# Patient Record
Sex: Male | Born: 1970
Health system: Southern US, Community
[De-identification: ages and names within clinical notes are randomized; demographics above are authoritative.]

## PROBLEM LIST (undated history)

## (undated) DIAGNOSIS — I10 Essential (primary) hypertension: Secondary | ICD-10-CM

## (undated) DIAGNOSIS — E7800 Pure hypercholesterolemia, unspecified: Secondary | ICD-10-CM

## (undated) DIAGNOSIS — Z87442 Personal history of urinary calculi: Secondary | ICD-10-CM

## (undated) DIAGNOSIS — K429 Umbilical hernia without obstruction or gangrene: Secondary | ICD-10-CM

## (undated) DIAGNOSIS — K469 Unspecified abdominal hernia without obstruction or gangrene: Secondary | ICD-10-CM

## (undated) DIAGNOSIS — G4730 Sleep apnea, unspecified: Secondary | ICD-10-CM

## (undated) DIAGNOSIS — E119 Type 2 diabetes mellitus without complications: Secondary | ICD-10-CM

## (undated) DIAGNOSIS — T7840XA Allergy, unspecified, initial encounter: Secondary | ICD-10-CM

## (undated) HISTORY — PX: KIDNEY STONE SURGERY: SHX686

## (undated) HISTORY — PX: INGUINAL HERNIA REPAIR: SUR1180

## (undated) HISTORY — DX: Allergy, unspecified, initial encounter: T78.40XA

## (undated) HISTORY — PX: FOOT SURGERY: SHX648

## (undated) HISTORY — DX: Umbilical hernia without obstruction or gangrene: K42.9

---

## 1999-03-29 ENCOUNTER — Emergency Department (HOSPITAL_COMMUNITY): Admission: EM | Admit: 1999-03-29 | Discharge: 1999-03-29 | Payer: Self-pay

## 1999-12-08 ENCOUNTER — Emergency Department (HOSPITAL_COMMUNITY): Admission: EM | Admit: 1999-12-08 | Discharge: 1999-12-08 | Payer: Self-pay | Admitting: Emergency Medicine

## 1999-12-09 ENCOUNTER — Emergency Department (HOSPITAL_COMMUNITY): Admission: EM | Admit: 1999-12-09 | Discharge: 1999-12-09 | Payer: Self-pay | Admitting: *Deleted

## 1999-12-09 ENCOUNTER — Encounter: Payer: Self-pay | Admitting: Emergency Medicine

## 1999-12-10 ENCOUNTER — Encounter: Payer: Self-pay | Admitting: Emergency Medicine

## 1999-12-14 ENCOUNTER — Ambulatory Visit (HOSPITAL_COMMUNITY): Admission: RE | Admit: 1999-12-14 | Discharge: 1999-12-14 | Payer: Self-pay | Admitting: Urology

## 1999-12-14 ENCOUNTER — Encounter: Payer: Self-pay | Admitting: Urology

## 1999-12-19 ENCOUNTER — Emergency Department (HOSPITAL_COMMUNITY): Admission: EM | Admit: 1999-12-19 | Discharge: 1999-12-19 | Payer: Self-pay | Admitting: Emergency Medicine

## 2001-12-13 ENCOUNTER — Emergency Department (HOSPITAL_COMMUNITY): Admission: EM | Admit: 2001-12-13 | Discharge: 2001-12-13 | Payer: Self-pay | Admitting: Emergency Medicine

## 2002-11-22 ENCOUNTER — Encounter: Admission: RE | Admit: 2002-11-22 | Discharge: 2002-11-22 | Payer: Self-pay | Admitting: Internal Medicine

## 2002-12-06 ENCOUNTER — Encounter: Admission: RE | Admit: 2002-12-06 | Discharge: 2002-12-06 | Payer: Self-pay | Admitting: Internal Medicine

## 2002-12-06 ENCOUNTER — Ambulatory Visit (HOSPITAL_COMMUNITY): Admission: RE | Admit: 2002-12-06 | Discharge: 2002-12-06 | Payer: Self-pay | Admitting: Internal Medicine

## 2005-04-26 ENCOUNTER — Ambulatory Visit: Payer: Self-pay | Admitting: Family Medicine

## 2005-10-28 ENCOUNTER — Emergency Department (HOSPITAL_COMMUNITY): Admission: EM | Admit: 2005-10-28 | Discharge: 2005-10-28 | Payer: Self-pay | Admitting: Family Medicine

## 2005-11-01 ENCOUNTER — Emergency Department (HOSPITAL_COMMUNITY): Admission: EM | Admit: 2005-11-01 | Discharge: 2005-11-01 | Payer: Self-pay | Admitting: Family Medicine

## 2005-12-16 HISTORY — PX: CARPAL TUNNEL RELEASE: SHX101

## 2007-07-18 ENCOUNTER — Emergency Department (HOSPITAL_COMMUNITY): Admission: EM | Admit: 2007-07-18 | Discharge: 2007-07-18 | Payer: Self-pay | Admitting: Family Medicine

## 2007-10-06 ENCOUNTER — Emergency Department (HOSPITAL_COMMUNITY): Admission: EM | Admit: 2007-10-06 | Discharge: 2007-10-06 | Payer: Self-pay | Admitting: Emergency Medicine

## 2008-01-16 ENCOUNTER — Emergency Department (HOSPITAL_COMMUNITY): Admission: EM | Admit: 2008-01-16 | Discharge: 2008-01-16 | Payer: Self-pay | Admitting: Emergency Medicine

## 2010-08-15 ENCOUNTER — Emergency Department (HOSPITAL_COMMUNITY): Admission: EM | Admit: 2010-08-15 | Discharge: 2010-08-15 | Payer: Self-pay | Admitting: Family Medicine

## 2011-03-07 ENCOUNTER — Inpatient Hospital Stay (INDEPENDENT_AMBULATORY_CARE_PROVIDER_SITE_OTHER)
Admission: RE | Admit: 2011-03-07 | Discharge: 2011-03-07 | Disposition: A | Discharge status: Home / Self Care | Payer: BC Managed Care – PPO | Source: Ambulatory Visit | Attending: Family Medicine | Admitting: Family Medicine

## 2011-03-07 ENCOUNTER — Emergency Department (HOSPITAL_COMMUNITY): Payer: BC Managed Care – PPO

## 2011-03-07 ENCOUNTER — Encounter (HOSPITAL_COMMUNITY): Payer: Self-pay | Admitting: Radiology

## 2011-03-07 ENCOUNTER — Emergency Department (HOSPITAL_COMMUNITY)
Admission: EM | Admit: 2011-03-07 | Discharge: 2011-03-07 | Disposition: A | Discharge status: Home / Self Care | Payer: BC Managed Care – PPO | Attending: Emergency Medicine | Admitting: Emergency Medicine

## 2011-03-07 DIAGNOSIS — R1032 Left lower quadrant pain: Secondary | ICD-10-CM | POA: Insufficient documentation

## 2011-03-07 DIAGNOSIS — R109 Unspecified abdominal pain: Secondary | ICD-10-CM

## 2011-03-07 HISTORY — DX: Unspecified abdominal hernia without obstruction or gangrene: K46.9

## 2011-03-07 LAB — DIFFERENTIAL
Lymphocytes Relative: 36 % (ref 12–46)
Lymphs Abs: 3.7 10*3/uL (ref 0.7–4.0)
Monocytes Absolute: 0.7 10*3/uL (ref 0.1–1.0)
Monocytes Relative: 7 % (ref 3–12)
Neutro Abs: 5.5 10*3/uL (ref 1.7–7.7)

## 2011-03-07 LAB — URINALYSIS, ROUTINE W REFLEX MICROSCOPIC
Bilirubin Urine: NEGATIVE
Nitrite: NEGATIVE
Specific Gravity, Urine: 1.023 (ref 1.005–1.030)
Urobilinogen, UA: 0.2 mg/dL (ref 0.0–1.0)

## 2011-03-07 LAB — COMPREHENSIVE METABOLIC PANEL
ALT: 33 U/L (ref 0–53)
CO2: 29 mEq/L (ref 19–32)
Calcium: 9.6 mg/dL (ref 8.4–10.5)
Creatinine, Ser: 1.14 mg/dL (ref 0.4–1.5)
GFR calc non Af Amer: >60 mL/min (ref >60)
Glucose, Bld: 87 mg/dL (ref 70–99)
Sodium: 138 mEq/L (ref 135–145)

## 2011-03-07 LAB — LIPASE, BLOOD: Lipase: 33 U/L (ref 11–59)

## 2011-03-07 LAB — CBC
HCT: 45.5 % (ref 39.0–52.0)
Hemoglobin: 14.6 g/dL (ref 13.0–17.0)
MCH: 23.9 pg — ABNORMAL LOW (ref 26.0–34.0)
MCHC: 32.1 g/dL (ref 30.0–36.0)

## 2011-03-07 MED ORDER — IOHEXOL 300 MG/ML  SOLN
100.0000 mL | Freq: Once | INTRAMUSCULAR | Status: AC | PRN
  Administered 2011-03-07: 100 mL via INTRAVENOUS

## 2011-09-06 LAB — INFLUENZA A AND B ANTIGEN (CONVERTED LAB): Inflenza A Ag: NEGATIVE

## 2012-10-16 ENCOUNTER — Emergency Department (HOSPITAL_COMMUNITY)
Admission: EM | Admit: 2012-10-16 | Discharge: 2012-10-16 | Disposition: A | Discharge status: Home / Self Care | Payer: BC Managed Care – PPO | Source: Home / Self Care

## 2012-10-16 ENCOUNTER — Encounter (HOSPITAL_COMMUNITY): Payer: Self-pay | Admitting: *Deleted

## 2012-10-16 DIAGNOSIS — K5289 Other specified noninfective gastroenteritis and colitis: Secondary | ICD-10-CM

## 2012-10-16 LAB — OCCULT BLOOD, POC DEVICE: Fecal Occult Bld: NEGATIVE

## 2012-10-16 LAB — POCT URINALYSIS DIP (DEVICE)
Hgb urine dipstick: NEGATIVE
Ketones, ur: NEGATIVE mg/dL
Nitrite: NEGATIVE
Specific Gravity, Urine: 1.02 (ref 1.005–1.030)
Urobilinogen, UA: 0.2 mg/dL (ref 0.0–1.0)

## 2012-10-16 MED ORDER — CIPROFLOXACIN HCL 500 MG PO TABS: 500.0000 mg | ORAL_TABLET | Freq: Two times a day (BID) | ORAL | Status: DC

## 2012-10-16 MED ORDER — METRONIDAZOLE 500 MG PO TABS: 500.0000 mg | ORAL_TABLET | Freq: Two times a day (BID) | ORAL | Status: DC

## 2012-10-16 NOTE — ED Provider Notes (Signed)
History     CSN: [id number]  Arrival date & time 10/16/12  1626   None     Chief Complaint  Patient presents with  . Abdominal Pain    (Consider location/radiation/quality/duration/timing/severity/associated sxs/prior treatment) HPI Comments: This is a 41 year old male with left lower quadrant abdominal pain for 2 weeks. Its onset was abrupt and was rated 7-8/10 at the onset. This lasted for a few hours before it subsided. The pain has been rather constant and rated as a 6/10 since the onset. It is located in the left lower quadrant and does not radiate. He denies any associated symptoms. He denies headache, fever, chills,malaise, nausea, vomiting, diarrhea, constipation, or genitourinary symptoms. The reason for today's visit is not associated with any changes or acuity but he thought it was about time for it to go away by 2 weeks. He was seen approximately one year ago by Dr. Penni Bombard for the same complaints. The record of that visit is much like that of today. The history and physical is almost identical. A CT scan at that time revealed no diverticular disease but rather infiltration of fat about the descending colon that appears most consistent with appendagitis epiploica as interpreted by the radiologist. That episode of pain bradycardia spontaneously.  Patient is a 41 y.o. male presenting with abdominal pain.  Abdominal Pain The primary symptoms of the illness include abdominal pain. The primary symptoms of the illness do not include fever, fatigue, shortness of breath, nausea, vomiting or diarrhea.  Symptoms associated with the illness do not include diaphoresis or constipation.    Past Medical History  Diagnosis Date  . Hernia     bi lateral hernia repair    History reviewed. No pertinent past surgical history.  Family History  Problem Relation Age of Onset  . Family history unknown: Yes    History  Substance Use Topics  . Smoking status: Never Smoker   . Smokeless  tobacco: Not on file  . Alcohol Use: No      Review of Systems  Constitutional: Negative for fever, diaphoresis, activity change and fatigue.  HENT: Negative for neck pain and neck stiffness.   Eyes: Negative.   Respiratory: Negative for cough, chest tightness, shortness of breath and wheezing.   Cardiovascular: Negative for chest pain, palpitations and leg swelling.  Gastrointestinal: Positive for abdominal pain. Negative for nausea, vomiting, diarrhea, constipation, blood in stool, abdominal distention, anal bleeding and rectal pain.  Genitourinary: Negative.   Skin: Negative for color change and rash.  Neurological: Negative.   Psychiatric/Behavioral: Negative.  Negative for behavioral problems.    Allergies  Erythromycin base  Home Medications   Current Outpatient Rx  Name Route Sig Dispense Refill  . CIPROFLOXACIN HCL 500 MG PO TABS Oral Take 1 tablet (500 mg total) by mouth 2 (two) times daily. 14 tablet 0  . METRONIDAZOLE 500 MG PO TABS Oral Take 1 tablet (500 mg total) by mouth 2 (two) times daily. X 7 days 14 tablet 0    BP 142/104  Pulse 100  Temp 96.3 F (35.7 C) (Oral)  Resp 18  SpO2 97%  Physical Exam  Constitutional: He is oriented to person, place, and time. He appears well-developed and well-nourished. No distress.  HENT:  Head: Normocephalic and atraumatic.  Eyes: Conjunctivae normal and EOM are normal.  Neck: Normal range of motion. Neck supple.  Cardiovascular: Normal rate, regular rhythm and normal heart sounds.   Pulmonary/Chest: Effort normal and breath sounds normal. No respiratory distress. He  has no wheezes.  Abdominal: Soft. He exhibits no distension and no mass. There is no rebound and no guarding.       Tenderness in the focal area of the left lower quadrant.  Musculoskeletal: Normal range of motion. He exhibits no edema and no tenderness.  Neurological: He is alert and oriented to person, place, and time. No cranial nerve deficit.  Skin:  Skin is warm and dry. No rash noted. No erythema.  Psychiatric: He has a normal mood and affect.    ED Course  Procedures (including critical care time)   Labs Reviewed  POCT URINALYSIS DIP (DEVICE)  OCCULT BLOOD, POC DEVICE   No results found.   1. Epiploic appendagitis       MDM  Consulted with Dr. call he suggested I call a surgeon. The primary concern was that the small percentage person's with the above diagnoses may have colon torsion or vascular anomalies causing ischemia and  necrosis of the bowel. Differential would include diverticulitis and is noteworthy that the CT scan previously mentioned was read as no diverticular disease seen. I spoke with Dr. Janee Morn the general surgeon on call who recommended that I treat with Flagyl and Cipro and let him go home. He is to be instructed that if his pain increases when he develops fever, chills change in bowel habits, nausea, vomiting or other symptoms he is to go to the emergency department. Otherwise, he should followup with his PCP within the next 2-3 days. Cipro 500 mg twice a day for 7 days Flagyl 500 mg twice a day for 7 days        Hayden Rasmussen, NP 10/16/12 2232

## 2012-10-16 NOTE — ED Notes (Signed)
No discharge papers received.

## 2012-10-16 NOTE — ED Notes (Signed)
Pt reports sharp pain in left side of abdomen - denies nausea, vomiting , diarrhea - on and off for 2 weeks - no other complaints

## 2012-10-17 NOTE — ED Provider Notes (Signed)
Medical screening examination/treatment/procedure(s) were performed by non-physician practitioner and as supervising physician I was immediately available for consultation/collaboration.  Carlon Davidson   Tehilla Coffel, MD 10/17/12 0902 

## 2012-12-16 DIAGNOSIS — G4730 Sleep apnea, unspecified: Secondary | ICD-10-CM

## 2012-12-16 HISTORY — DX: Sleep apnea, unspecified: G47.30

## 2012-12-21 ENCOUNTER — Encounter (HOSPITAL_COMMUNITY): Payer: Self-pay | Admitting: *Deleted

## 2012-12-21 ENCOUNTER — Emergency Department (HOSPITAL_COMMUNITY)
Admission: EM | Admit: 2012-12-21 | Discharge: 2012-12-21 | Disposition: A | Discharge status: Home / Self Care | Payer: BC Managed Care – PPO | Source: Home / Self Care

## 2012-12-21 DIAGNOSIS — M722 Plantar fascial fibromatosis: Secondary | ICD-10-CM

## 2012-12-21 MED ORDER — TRIAMCINOLONE ACETONIDE 40 MG/ML IJ SUSP
INTRAMUSCULAR | Status: AC
  Filled 2012-12-21: qty 5

## 2012-12-21 NOTE — ED Provider Notes (Signed)
Medical screening examination/treatment/procedure(s) were performed by non-physician practitioner and as supervising physician I was immediately available for consultation/collaboration.  Rodrick Payson, M.D.   Antanisha Mohs C Flois Mctague, MD 12/21/12 2244 

## 2012-12-21 NOTE — ED Provider Notes (Signed)
History     CSN: [id number]  Arrival date & time 12/21/12  1027   None     Chief Complaint  Patient presents with  . Foot Pain    (Consider location/radiation/quality/duration/timing/severity/associated sxs/prior treatment) HPI Comments: 42 year old male with complaints of right heel pain. The pain is located at the angle of the heel primarily on the lower posterior aspect of the ankle. It does not involve the Achilles tendon. He was seen by podiatrist one year ago and was prescribed a special type of shoes and inserts. She continues to wear. His job requires long hours standing and walking . This exacerbates his discomfort. Denies any known injury or trauma.   Past Medical History  Diagnosis Date  . Hernia     bi lateral hernia repair    History reviewed. No pertinent past surgical history.  History reviewed. No pertinent family history.  History  Substance Use Topics  . Smoking status: Never Smoker   . Smokeless tobacco: Not on file  . Alcohol Use: No      Review of Systems  All other systems reviewed and are negative.    Allergies  Erythromycin base  Home Medications   Current Outpatient Rx  Name  Route  Sig  Dispense  Refill  . CIPROFLOXACIN HCL 500 MG PO TABS   Oral   Take 1 tablet (500 mg total) by mouth 2 (two) times daily.   14 tablet   0   . METRONIDAZOLE 500 MG PO TABS   Oral   Take 1 tablet (500 mg total) by mouth 2 (two) times daily. X 7 days   14 tablet   0     BP 149/90  Pulse 80  Temp 98.2 F (36.8 C) (Oral)  Resp 20  SpO2 97%  Physical Exam  Constitutional: He is oriented to person, place, and time. He appears well-developed and well-nourished.  HENT:  Head: Normocephalic and atraumatic.  Eyes: EOM are normal. Left eye exhibits no discharge.  Neck: Normal range of motion. Neck supple.  Musculoskeletal:       Feet:       Overlying skin and structure of the foot is normal. No discoloration, swelling or deformity. No  tenderness on the posterior aspect of the heel. The tenderness occurs at the angle of the plantar aspect to the posterior aspect. It is not occur along the palpable borders of the Achilles tendon.  Neurological: He is alert and oriented to person, place, and time. No cranial nerve deficit.  Skin: Skin is warm and dry.  Psychiatric: He has a normal mood and affect.    ED Course  Injection tendon or ligament Date/Time: 12/21/2012 12:38 PM Performed by: Phineas Real, Percy Comp Authorized by: Phineas Real, Maham Quintin Consent: Verbal consent obtained. Risks and benefits: risks, benefits and alternatives were discussed Consent given by: patient Patient understanding: patient states understanding of the procedure being performed Patient identity confirmed: verbally with patient Local anesthesia used: yes Anesthesia: local infiltration Local anesthetic: bupivacaine 0.5% without epinephrine Anesthetic total: 2 ml Patient sedated: no Patient tolerance: Patient tolerated the procedure well with no immediate complications. Comments: The lower most aspect of the heel was injected with 2 cc of Kenalog 2 cc of Marcaine from a medial approach. This would be tangential to the Achilles tendon and just superficial of the tendon.   (including critical care time)  Labs Reviewed - No data to display No results found.   1. Plantar fasciitis, right       MDM  The posterior aspect of the right heel is injected with a combination of 2 cc of Marcaine 2 cc of Kenalog. He is instructed to apply ice. He should also follow up with his podiatrist for persistent or continuing pain or discomfort.        Hayden Rasmussen, NP 12/21/12 1240  Hayden Rasmussen, NP 12/21/12 1244

## 2012-12-21 NOTE — ED Notes (Signed)
Pt reports heel pain with no known injury for the past 1-2 weeks - has seen md for same complaint about 1 year ago and was given insoles for shoes. - does stand on feet for long periods of time at work.

## 2013-02-22 ENCOUNTER — Ambulatory Visit (HOSPITAL_BASED_OUTPATIENT_CLINIC_OR_DEPARTMENT_OTHER): Payer: BC Managed Care – PPO | Attending: Family Medicine

## 2013-02-22 VITALS — Ht 67.0 in | Wt 240.0 lb

## 2013-02-22 DIAGNOSIS — G4737 Central sleep apnea in conditions classified elsewhere: Secondary | ICD-10-CM | POA: Insufficient documentation

## 2013-02-22 DIAGNOSIS — G4733 Obstructive sleep apnea (adult) (pediatric): Secondary | ICD-10-CM | POA: Insufficient documentation

## 2013-02-27 DIAGNOSIS — G4733 Obstructive sleep apnea (adult) (pediatric): Secondary | ICD-10-CM

## 2013-02-27 DIAGNOSIS — R0989 Other specified symptoms and signs involving the circulatory and respiratory systems: Secondary | ICD-10-CM

## 2013-02-27 DIAGNOSIS — G4737 Central sleep apnea in conditions classified elsewhere: Secondary | ICD-10-CM

## 2013-02-27 DIAGNOSIS — R0609 Other forms of dyspnea: Secondary | ICD-10-CM

## 2013-02-28 NOTE — Procedures (Signed)
NAMEFERMAN, Walter              ACCOUNT NO.:  [id number]  MEDICAL RECORD NO.:  [id number]          PATIENT TYPE:  OUT  LOCATION:  SLEEP CENTER                 FACILITY:  [facility]  PHYSICIAN:  Sameen Leas D. Maple Hudson, MD, FCCP, FACPDATE OF BIRTH:  07/13/1970  DATE OF STUDY:  02/22/2013                           NOCTURNAL POLYSOMNOGRAM  REFERRING PHYSICIAN:  ENRICO G JONES  INDICATION FOR STUDY:  Hypersomnia with sleep apnea.  EPWORTH SLEEPINESS SCORE:  17/24.  BMI 38, weight 240 pounds, height 67 inches, neck 18 inches.  MEDICATIONS:  Home medications are charted and reviewed.  SLEEP ARCHITECTURE:  Split study protocol.  During the diagnostic phase, total sleep time 122.5 minutes with sleep efficiency 89.1%.  Stage I 13.5%, stage II 76.3%, stage III absent, REM 10.2% of total sleep time. Sleep latency 3.5 minutes, REM latency 102 minutes.  Awake after sleep onset 11.5 minutes.  Arousal index 17.6.  Bedtime Medication:  None.  RESPIRATORY DATA:  Split study protocol.  Apnea-hypopnea index (AHI) 62.7 per hour.  A total of 120 events was scored including 50 obstructive apneas, 30 central apneas, 4 mixed apneas, 44 hypopneas. Events were seen in all sleep positions.  REM AHI 52.8 per hour.  CPAP was titrated to 12 CWP, AHI 0 per hour.  He wore a medium ResMed Quattro Mirage full-face mask with heated humidifier and an EPR of 1.  OXYGEN DATA:  Before CPAP, snoring was extremely loud with oxygen desaturation to a nadir of 62% on room air.  With CPAP titration, snoring was prevented and mean oxygen saturation of 93.9% on room air.  CARDIAC DATA:  Normal sinus rhythm.  MOVEMENT-PARASOMNIA:  A few limb jerks were noted during the diagnostic phase, with little sleep disturbance.  Bathroom x1.  IMPRESSIONS-RECOMMENDATIONS: 1. Severe obstructive and central sleep apnea-hypopnea syndrome, AHI     62.7 per hour with events in all sleep positions.  Extremely loud     snoring with oxygen  desaturation to a nadir of 62% on room air. 2. Successful CPAP titration to 12 CWP, AHI 0 per hour.  He wore a     medium ResMed Quattro Mirage full-face     mask with heated humidifier and an EPR of 1.  Snoring was prevented     and mean oxygen saturation held 93.9% on room air.     Dezirae Service D. Maple Hudson, MD, [facility], FACP Diplomate, American Board of Sleep Medicine    CDY/MEDQ  D:  02/27/2013 09:58:12  T:  02/28/2013 00:47:16  Job:  [id number]

## 2013-03-26 ENCOUNTER — Encounter (HOSPITAL_COMMUNITY): Payer: Self-pay | Admitting: Emergency Medicine

## 2013-03-26 ENCOUNTER — Emergency Department (HOSPITAL_COMMUNITY)
Admission: EM | Admit: 2013-03-26 | Discharge: 2013-03-26 | Disposition: A | Discharge status: Home / Self Care | Payer: BC Managed Care – PPO | Source: Home / Self Care | Attending: Family Medicine | Admitting: Family Medicine

## 2013-03-26 DIAGNOSIS — L600 Ingrowing nail: Secondary | ICD-10-CM

## 2013-03-26 HISTORY — DX: Essential (primary) hypertension: I10

## 2013-03-26 HISTORY — DX: Pure hypercholesterolemia, unspecified: E78.00

## 2013-03-26 MED ORDER — HYDROCODONE-ACETAMINOPHEN 5-325 MG PO TABS: 1.0000 | ORAL_TABLET | ORAL | Status: DC | PRN

## 2013-03-26 NOTE — ED Notes (Signed)
Reports: ingrown toe nail x 1 week of left great toe. Some drainage.  Pt states that he has been soaking foot in epsom salt with relief  Toe is red and swollen.

## 2013-03-26 NOTE — ED Provider Notes (Signed)
History     CSN: [id number]  Arrival date & time 03/26/13  1723   First MD Initiated Contact with Patient 03/26/13 1724      Chief Complaint  Patient presents with  . Ingrown Toenail    x 1 wk. toe is red and swollen. some drainage.     (Consider location/radiation/quality/duration/timing/severity/associated sxs/prior treatment) Patient is a 42 y.o. male presenting with lower extremity pain. The history is provided by the patient.  Foot Pain This is a new problem. The current episode started more than 1 week ago. The problem has been gradually worsening.    Past Medical History  Diagnosis Date  . Hernia     bi lateral hernia repair  . Hypertension   . Elevated cholesterol     History reviewed. No pertinent past surgical history.  History reviewed. No pertinent family history.  History  Substance Use Topics  . Smoking status: Never Smoker   . Smokeless tobacco: Not on file  . Alcohol Use: No      Review of Systems  Constitutional: Negative.   Musculoskeletal: Positive for gait problem.    Allergies  Erythromycin base  Home Medications   Current Outpatient Rx  Name  Route  Sig  Dispense  Refill  . ciprofloxacin (CIPRO) 500 MG tablet   Oral   Take 1 tablet (500 mg total) by mouth 2 (two) times daily.   14 tablet   0   . HYDROcodone-acetaminophen (NORCO/VICODIN) 5-325 MG per tablet   Oral   Take 1 tablet by mouth every 4 (four) hours as needed for pain.   20 tablet   0   . metroNIDAZOLE (FLAGYL) 500 MG tablet   Oral   Take 1 tablet (500 mg total) by mouth 2 (two) times daily. X 7 days   14 tablet   0     BP 138/80  Pulse 84  Temp(Src) 98.4 F (36.9 C) (Oral)  Resp 16  SpO2 98%  Physical Exam  Nursing note and vitals reviewed. Constitutional: He is oriented to person, place, and time. He appears well-developed and well-nourished.  Musculoskeletal: He exhibits tenderness.       Feet:  Neurological: He is alert and oriented to person,  place, and time.  Skin: Skin is warm and dry.    ED Course  NAIL REMOVAL Date/Time: 03/26/2013 6:42 PM Performed by: Bradd Canary D Authorized by: Bradd Canary D Consent: Verbal consent obtained. Risks and benefits: risks, benefits and alternatives were discussed Consent given by: patient Location: left foot Anesthesia: digital block Local anesthetic: bupivacaine 0.5% without epinephrine Patient sedated: no Preparation: skin prepped with Betadine Amount removed: 1/3 Nail bed sutured: no Nail matrix removed: partial Dressing: Xeroform gauze Patient tolerance: Patient tolerated the procedure well with no immediate complications.   (including critical care time)  Labs Reviewed - No data to display No results found.   1. Ingrown left greater toenail       MDM          Linna Hoff, MD 03/26/13 [phone]

## 2013-03-27 NOTE — ED Notes (Signed)
Returned pt's call and LM on [phone] in re: to throbbing pain after ingrown toe nail was removed.

## 2013-03-28 ENCOUNTER — Emergency Department (INDEPENDENT_AMBULATORY_CARE_PROVIDER_SITE_OTHER)
Admission: EM | Admit: 2013-03-28 | Discharge: 2013-03-28 | Disposition: A | Discharge status: Home / Self Care | Payer: BC Managed Care – PPO | Source: Home / Self Care | Attending: Family Medicine | Admitting: Family Medicine

## 2013-03-28 ENCOUNTER — Encounter (HOSPITAL_COMMUNITY): Payer: Self-pay | Admitting: *Deleted

## 2013-03-28 DIAGNOSIS — L02619 Cutaneous abscess of unspecified foot: Secondary | ICD-10-CM

## 2013-03-28 LAB — GLUCOSE, CAPILLARY: Glucose-Capillary: 75 mg/dL (ref 70–99)

## 2013-03-28 MED ORDER — LIDOCAINE HCL (PF) 1 % IJ SOLN
INTRAMUSCULAR | Status: AC
  Filled 2013-03-28: qty 5

## 2013-03-28 MED ORDER — BACITRACIN 500 UNIT/GM EX OINT
1.0000 "application " | TOPICAL_OINTMENT | Freq: Once | CUTANEOUS | Status: AC
  Administered 2013-03-28: 1 via TOPICAL

## 2013-03-28 MED ORDER — CEFTRIAXONE SODIUM 1 G IJ SOLR
1.0000 g | Freq: Once | INTRAMUSCULAR | Status: AC
  Administered 2013-03-28: 1 g via INTRAMUSCULAR

## 2013-03-28 MED ORDER — CEFTRIAXONE SODIUM 1 G IJ SOLR
INTRAMUSCULAR | Status: AC
  Filled 2013-03-28: qty 10

## 2013-03-28 MED ORDER — CIPROFLOXACIN HCL 500 MG PO TABS: 500.0000 mg | ORAL_TABLET | Freq: Two times a day (BID) | ORAL | Status: DC

## 2013-03-28 MED ORDER — IBUPROFEN 600 MG PO TABS: 600.0000 mg | ORAL_TABLET | Freq: Three times a day (TID) | ORAL | Status: DC | PRN

## 2013-03-28 MED ORDER — DOXYCYCLINE HYCLATE 100 MG PO CAPS: 100.0000 mg | ORAL_CAPSULE | Freq: Two times a day (BID) | ORAL | Status: DC

## 2013-03-28 MED ORDER — BACITRACIN 500 UNIT/GM EX OINT: 1.0000 "application " | TOPICAL_OINTMENT | Freq: Two times a day (BID) | CUTANEOUS | Status: DC

## 2013-03-28 NOTE — ED Notes (Signed)
Infection area marked with skin marker

## 2013-03-28 NOTE — ED Provider Notes (Signed)
History     CSN: [id number]  Arrival date & time 03/28/13  1414   First MD Initiated Contact with Patient 03/28/13 1426      Chief Complaint  Patient presents with  . Toe Pain    (Consider location/radiation/quality/duration/timing/severity/associated sxs/prior treatment) HPI Comments: 42 y/o male non diabetic who comes for redness, pain and swelling of his left 1st toe. Patient was seen here 2 days ago for ingrown toenail. Had surgical removal of half of his toe nail and cauterization of a granuloma. Reports pain swelling and redness is worse. Taking hydrocodone for pain. Not taking antibiotics. Denies fever or chills. Otherwise doing well. Patient doing foot soaks and Epson salts.    Past Medical History  Diagnosis Date  . Hernia     bi lateral hernia repair  . Hypertension   . Elevated cholesterol     History reviewed. No pertinent past surgical history.  Family History  Problem Relation Age of Onset  . Family history unknown: Yes    History  Substance Use Topics  . Smoking status: Never Smoker   . Smokeless tobacco: Not on file  . Alcohol Use: No      Review of Systems  Constitutional: Negative for fever and chills.  Gastrointestinal: Negative for nausea and vomiting.  Endocrine: Negative for polydipsia, polyphagia and polyuria.  Skin: Positive for wound.    Allergies  Erythromycin base  Home Medications   Current Outpatient Rx  Name  Route  Sig  Dispense  Refill  . ciprofloxacin (CIPRO) 500 MG tablet   Oral   Take 1 tablet (500 mg total) by mouth 2 (two) times daily.   14 tablet   0   . doxycycline (VIBRAMYCIN) 100 MG capsule   Oral   Take 1 capsule (100 mg total) by mouth 2 (two) times daily.   20 capsule   0   . HYDROcodone-acetaminophen (NORCO/VICODIN) 5-325 MG per tablet   Oral   Take 1 tablet by mouth every 4 (four) hours as needed for pain.   20 tablet   0   . ibuprofen (ADVIL,MOTRIN) 600 MG tablet   Oral   Take 1 tablet (600 mg  total) by mouth every 8 (eight) hours as needed for pain.   30 tablet   0     BP 131/88  Pulse 90  Temp(Src) 97.4 F (36.3 C) (Oral)  Resp 18  SpO2 100%  Physical Exam  Nursing note and vitals reviewed. Constitutional: He is oriented to person, place, and time. He appears well-developed and well-nourished. No distress.  Cardiovascular: Normal heart sounds.   Pulmonary/Chest: Breath sounds normal.  Neurological: He is alert and oriented to person, place, and time.  Skin: He is not diaphoretic.  Left toe: there is swelling and redness around nail worse on medial side around cauterized wound. No fluctuation or purulent drainage. Area tender to palpation. There is some gray friable devitalized skin on top or wound. Medial half of toe nail has been surgically removed.  Skin erythema has been marked with dermographic pen.    ED Course  Procedures (including critical care time)  Labs Reviewed  GLUCOSE, CAPILLARY   No results found.   1. Cellulitis, toe, left       MDM  Treated with rocephin 1 g IM x1 here. Prescribed doxycycline and Cipro. Asked to return in 24 hours for follow up. CBG: 75. Supportive care and red flags that should prompt earlier return discuss with patient and provided in writing.  Sharin Grave, MD 03/29/13 [phone]

## 2013-03-28 NOTE — ED Notes (Signed)
Pt reports that toe is sore, red, draining and tender to touch - was not given antibiotic when toe nail was removed here the other day

## 2013-11-17 ENCOUNTER — Ambulatory Visit (INDEPENDENT_AMBULATORY_CARE_PROVIDER_SITE_OTHER): Payer: BC Managed Care – PPO | Admitting: Emergency Medicine

## 2013-11-17 VITALS — BP 152/100 | HR 91 | Temp 98.1°F | Resp 16 | Ht 67.0 in | Wt 239.0 lb

## 2013-11-17 DIAGNOSIS — Z0000 Encounter for general adult medical examination without abnormal findings: Secondary | ICD-10-CM

## 2013-11-17 DIAGNOSIS — I10 Essential (primary) hypertension: Secondary | ICD-10-CM

## 2013-11-17 DIAGNOSIS — G4730 Sleep apnea, unspecified: Secondary | ICD-10-CM

## 2013-11-17 DIAGNOSIS — L600 Ingrowing nail: Secondary | ICD-10-CM

## 2013-11-17 DIAGNOSIS — E782 Mixed hyperlipidemia: Secondary | ICD-10-CM

## 2013-11-17 LAB — POCT CBC
HCT, POC: 47.7 % (ref 43.5–53.7)
Hemoglobin: 14.3 g/dL (ref 14.1–18.1)
Lymph, poc: 3.6 — AB (ref 0.6–3.4)
MCH, POC: 24 pg — AB (ref 27–31.2)
MCHC: 30 g/dL — AB (ref 31.8–35.4)
RBC: 5.96 M/uL (ref 4.69–6.13)
WBC: 9.5 10*3/uL (ref 4.6–10.2)

## 2013-11-17 LAB — POCT URINALYSIS DIPSTICK
Bilirubin, UA: NEGATIVE
Blood, UA: NEGATIVE
Glucose, UA: NEGATIVE
Ketones, UA: NEGATIVE
Leukocytes, UA: NEGATIVE
pH, UA: 6.5

## 2013-11-17 MED ORDER — METRONIDAZOLE 500 MG PO TABS: 2000.0000 mg | ORAL_TABLET | Freq: Once | ORAL | Status: DC

## 2013-11-17 MED ORDER — LISINOPRIL 20 MG PO TABS: 20.0000 mg | ORAL_TABLET | Freq: Every day | ORAL | Status: DC

## 2013-11-17 NOTE — Progress Notes (Signed)
[facility] [address], [place] [address] [phone]  Date:  11/17/2013   Name:  Walter Reynolds.   DOB:  March 28, 1971   MRN:  [id number]  PCP:  Paulino Rily, MD    Chief Complaint: Annual Exam and STD testing   History of Present Illness:  Walter Prins. is a 42 y.o. very pleasant male patient who presents with the following:  Comes to the office for a wellness examination.  He has a history of elevated cholesterol and hypertension but is not taking meds.  Is concerned that he may have diabetes.  His wife was just treated for trichomonas and he wants to be "tested".    No improvement with over the counter medications or other home remedies. Denies other complaint or health concern today.        Patient Active Problem List   Diagnosis Date Noted  . Unspecified sleep apnea 11/17/2013    Past Medical History  Diagnosis Date  . Hernia     bi lateral hernia repair  . Hypertension   . Elevated cholesterol   . Allergy     History reviewed. No pertinent past surgical history.  History  Substance Use Topics  . Smoking status: Never Smoker   . Smokeless tobacco: Not on file  . Alcohol Use: No    Family History  Problem Relation Age of Onset  . Diabetes Mother   . Stroke Mother     4 strokes  . Hypertension Father   . Hypertension Sister   . Heart disease Sister     pacemaker    Allergies  Allergen Reactions  . Erythromycin Base     Medication list has been reviewed and updated.  No current outpatient prescriptions on file prior to visit.   No current facility-administered medications on file prior to visit.    Review of Systems:  As per HPI, otherwise negative.    Physical Examination: Filed Vitals:   11/17/13 1708  BP: 152/100  Pulse: 91  Temp: 98.1 F (36.7 C)  Resp: 16   Filed Vitals:   11/17/13 1708  Height: 5\' 7"  (1.702 m)  Weight: 239 lb (108.41 kg)   Body mass index is 37.42 kg/(m^2). Ideal  Body Weight: Weight in (lb) to have BMI = 25: 159.3  GEN: obese, NAD, Non-toxic, A & O x 3 HEENT: Atraumatic, Normocephalic. Neck supple. No masses, No LAD. Ears and Nose: No external deformity. CV: RRR, No M/G/R. No JVD. No thrill. No extra heart sounds. PULM: CTA B, no wheezes, crackles, rhonchi. No retractions. No resp. distress. No accessory muscle use. ABD: S, NT, ND, +BS. No rebound. No HSM. EXTR: No c/c/e  Left great toenail ingrowing NEURO Normal gait.  PSYCH: Normally interactive. Conversant. Not depressed or anxious appearing.  Calm demeanor.    Assessment and Plan: Obesity Hypertension Lisinopril Weight loss Labs OSA Ingrown nail   Signed,  Phillips Odor, MD

## 2013-11-17 NOTE — Patient Instructions (Signed)
Trichomonas Test  This is a test used to diagnose an infection with Trichomonas vaginalis. This is a sexually transmitted, microscopic parasite that causes vaginal infections in women and urethritis in some men. This test is often done if there is vaginal discharge or pain on urination. If you have an infection with another sexually transmitted disease, your caregiver might test for trichomonas as well. Secretions or a sample are collected on a swab and are examined under a microscope or cultured to detect the presence of Trichomonas vaginalis.  Trichomonas is one of the most common sexually transmitted diseases. An infected person is at greater risk of getting other sexually transmitted diseases, so your caregiver may want to test for these other infections also.   Trichomonas infection can affect pregnancy, contributing to premature birth and low birth weight. You should inform your physician if you may be pregnant. The doctor may medically manage a woman who is infected and in her first three months of pregnancy differently.   SPECIMEN COLLECTION  In women, a swab of secretions is collected from the vagina. In men, a swab is inserted into the urethra.  MEANING OF TEST   A positive test indicates an active infection that requires treatment with antibiotics. It is usually treated with an antibiotic called metronidazole. All current sexual partners must be treated at the same time or the patient is likely to become re-infected.  The Center for Disease Control (CDC) recommends the following guidelines to avoid infection or reinfection:    Abstain from sexual intercourse   Use a latex condom properly, every time you have sexual intercourse, with every partner.   Limit your sexual partners. The more sex partners you have, the greater your risk of encountering someone who has this or other STI's.   If you are infected, your sexual partner(s) should be treated. This will prevent you from getting  reinfected.  OBTAINING THE TEST RESULTS  It is your responsibility to obtain your test results. Ask the lab or department performing the test when and how you will get your results.  Document Released: 01/04/2005 Document Revised: 03/29/2013 Document Reviewed: 09/11/2005  [employer] Patient Information 2014 [employer].

## 2013-11-18 LAB — COMPREHENSIVE METABOLIC PANEL
ALT: 25 U/L (ref 0–53)
AST: 21 U/L (ref 0–37)
Albumin: 4.4 g/dL (ref 3.5–5.2)
CO2: 29 mEq/L (ref 19–32)
Calcium: 9.3 mg/dL (ref 8.4–10.5)
Chloride: 102 mEq/L (ref 96–112)
Creat: 1.05 mg/dL (ref 0.50–1.35)
Potassium: 4.8 mEq/L (ref 3.5–5.3)

## 2013-11-18 LAB — LIPID PANEL
Cholesterol: 246 mg/dL — ABNORMAL HIGH (ref 0–200)
Total CHOL/HDL Ratio: 9.8 Ratio

## 2013-11-18 MED ORDER — FENOFIBRATE 145 MG PO TABS: 145.0000 mg | ORAL_TABLET | Freq: Every day | ORAL | Status: DC

## 2013-11-18 NOTE — Addendum Note (Signed)
Addended by: Carmelina Dane on: 11/18/2013 04:07 PM   Modules accepted: Orders

## 2013-11-26 ENCOUNTER — Ambulatory Visit: Payer: BC Managed Care – PPO

## 2014-03-01 ENCOUNTER — Other Ambulatory Visit: Payer: Self-pay | Admitting: Emergency Medicine

## 2014-03-21 ENCOUNTER — Ambulatory Visit (INDEPENDENT_AMBULATORY_CARE_PROVIDER_SITE_OTHER): Payer: BC Managed Care – PPO | Admitting: Family Medicine

## 2014-03-21 VITALS — BP 120/80 | HR 110 | Temp 98.6°F | Resp 16 | Ht 66.0 in | Wt 236.0 lb

## 2014-03-21 DIAGNOSIS — I10 Essential (primary) hypertension: Secondary | ICD-10-CM

## 2014-03-21 DIAGNOSIS — R0981 Nasal congestion: Secondary | ICD-10-CM

## 2014-03-21 DIAGNOSIS — J019 Acute sinusitis, unspecified: Secondary | ICD-10-CM

## 2014-03-21 DIAGNOSIS — E785 Hyperlipidemia, unspecified: Secondary | ICD-10-CM

## 2014-03-21 DIAGNOSIS — R52 Pain, unspecified: Secondary | ICD-10-CM

## 2014-03-21 LAB — POCT CBC
Granulocyte percent: 55.6 %G (ref 37–80)
HCT, POC: 45.2 % (ref 43.5–53.7)
Hemoglobin: 14 g/dL — AB (ref 14.1–18.1)
LYMPH, POC: 2 (ref 0.6–3.4)
MCH: 24 pg — AB (ref 27–31.2)
MCHC: 31 g/dL — AB (ref 31.8–35.4)
MCV: 77.5 fL — AB (ref 80–97)
MID (CBC): 1.1 — AB (ref 0–0.9)
MPV: 9.5 fL (ref 0–99.8)
PLATELET COUNT, POC: 288 10*3/uL (ref 142–424)
POC Granulocyte: 3.9 (ref 2–6.9)
POC LYMPH PERCENT: 29.2 %L (ref 10–50)
POC MID %: 15.2 % — AB (ref 0–12)
RBC: 5.83 M/uL (ref 4.69–6.13)
RDW, POC: 15.5 %
WBC: 7 10*3/uL (ref 4.6–10.2)

## 2014-03-21 LAB — POCT INFLUENZA A/B
Influenza A, POC: NEGATIVE
Influenza B, POC: NEGATIVE

## 2014-03-21 MED ORDER — AMOXICILLIN 500 MG PO CAPS: 1000.0000 mg | ORAL_CAPSULE | Freq: Two times a day (BID) | ORAL | Status: DC

## 2014-03-21 NOTE — Progress Notes (Signed)
[facility] [address], [place] [address] [phone]  Date:  03/21/2014   Name:  Walter Messicksaiah L Helton Jr.   DOB:  1971/12/09   MRN:  [id number]  PCP:  Paulino RilyJONES,ENRICO G, MD    Chief Complaint: Nasal Congestion and Generalized Body Aches   History of Present Illness:  Walter Messicksaiah L Eugenio Jr. is a 43 y.o. very pleasant male patient who presents with the following:  He has noted generalized body aches for the last several days; for about a week or more.  He is having a lot of nasal congestion.  He is able to breathe through his mouth but not his nose.   He is using nyquil as needed He is blowing mucus out of his nose.   He has a dry cough.  He has not noted a fever.   No GI symptoms No nasal sprays used except he tried flonase once.  He does use saline rinses.    He is taking medication for his cholesterol and BP.  Would like to check labs today if possible He did eat this am around 4- 6 hours ago.    He has noted nasal congestion fairly chronically, and also notes a cough when he tries to sing at church.  No cough with exercise.  He would like to see ENT regarding these issues Patient Active Problem List   Diagnosis Date Noted  . Unspecified sleep apnea 11/17/2013    Past Medical History  Diagnosis Date  . Hernia     bi lateral hernia repair  . Hypertension   . Elevated cholesterol   . Allergy     History reviewed. No pertinent past surgical history.  History  Substance Use Topics  . Smoking status: Never Smoker   . Smokeless tobacco: Not on file  . Alcohol Use: No    Family History  Problem Relation Age of Onset  . Diabetes Mother   . Stroke Mother     4 strokes  . Hypertension Father   . Hypertension Sister   . Heart disease Sister     pacemaker    Allergies  Allergen Reactions  . Erythromycin Base     Medication list has been reviewed and updated.  Current Outpatient Prescriptions on File Prior to Visit  Medication Sig Dispense  Refill  . fenofibrate (TRICOR) 145 MG tablet TAKE 1 TABLET (145 MG TOTAL) BY MOUTH DAILY.  30 tablet  0  . lisinopril (PRINIVIL,ZESTRIL) 20 MG tablet Take 1 tablet (20 mg total) by mouth daily.  90 tablet  1  . metroNIDAZOLE (FLAGYL) 500 MG tablet Take 4 tablets (2,000 mg total) by mouth once. DO NOT CONSUME ALCOHOL WHILE TAKING THIS MEDICATION.  4 tablet  0   No current facility-administered medications on file prior to visit.    Review of Systems:  As per HPI- otherwise negative.   Physical Examination: Filed Vitals:   03/21/14 1344  BP: 120/80  Pulse: 110  Temp: 98.6 F (37 C)  Resp: 16   Filed Vitals:   03/21/14 1344  Height: 5\' 6"  (1.676 m)  Weight: 236 lb (107.049 kg)   Body mass index is 38.11 kg/(m^2). Ideal Body Weight: Weight in (lb) to have BMI = 25: 154.6  GEN: WDWN, NAD, Non-toxic, A & O x 3, obese, nasal congstion HEENT: Atraumatic, Normocephalic. Neck supple. No masses, No LAD.  Bilateral TM wnl, oropharynx normal.  PEERL,EOMI.   Nasal passages are congested with mucus.  Ears  and Nose: No external deformity. CV: RRR, No M/G/R. No JVD. No thrill. No extra heart sounds. PULM: CTA B, no wheezes, crackles, rhonchi. No retractions. No resp. distress. No accessory muscle use. ABD: S, NT, ND, +BS. No rebound. No HSM. EXTR: No c/c/e NEURO Normal gait.  PSYCH: Normally interactive. Conversant. Not depressed or anxious appearing.  Calm demeanor.   Results for orders placed in visit on 03/21/14  POCT CBC      Result Value Ref Range   WBC 7.0  4.6 - 10.2 K/uL   Lymph, poc 2.0  0.6 - 3.4   POC LYMPH PERCENT 29.2  10 - 50 %L   MID (cbc) 1.1 (*) 0 - 0.9   POC MID % 15.2 (*) 0 - 12 %M   POC Granulocyte 3.9  2 - 6.9   Granulocyte percent 55.6  37 - 80 %G   RBC 5.83  4.69 - 6.13 M/uL   Hemoglobin 14.0 (*) 14.1 - 18.1 g/dL   HCT, POC 16.1  09.6 - 53.7 %   MCV 77.5 (*) 80 - 97 fL   MCH, POC 24.0 (*) 27 - 31.2 pg   MCHC 31.0 (*) 31.8 - 35.4 g/dL   RDW, POC 04.5      Platelet Count, POC 288  142 - 424 K/uL   MPV 9.5  0 - 99.8 fL  POCT INFLUENZA A/B      Result Value Ref Range   Influenza A, POC Negative     Influenza B, POC Negative      Assessment and Plan: Sinusitis, acute - Plan: amoxicillin (AMOXIL) 500 MG capsule  Dyslipidemia - Plan: Lipid panel, Comprehensive metabolic panel, CANCELED: Basic metabolic panel  Body aches - Plan: POCT CBC, POCT Influenza A/B  HTN (hypertension) - Plan: Comprehensive metabolic panel, CANCELED: Basic metabolic panel  Nasal congestion - Plan: Ambulatory referral to ENT  Await labs as above. Treat for sinusitis with amoxicillin.  Plan referral to ENT for chronic nasal congestion and cough with singing.   He may use afrin for a few days as he is miserable with acute nasal congestion.    Signed Abbe Amsterdam, MD

## 2014-03-21 NOTE — Patient Instructions (Signed)
We are doing to treat you for sinuitis with amoxicillin.  Try some afrin as needed for nasal congestion.  Stop using this after 4-5 days.   I'll be in touch with your labs in the next few days.

## 2014-03-22 ENCOUNTER — Telehealth: Payer: Self-pay

## 2014-03-22 ENCOUNTER — Encounter: Payer: Self-pay | Admitting: Family Medicine

## 2014-03-22 LAB — LIPID PANEL
Cholesterol: 198 mg/dL (ref 0–200)
HDL: 28 mg/dL — ABNORMAL LOW (ref >39)
LDL Cholesterol: 128 mg/dL — ABNORMAL HIGH (ref 0–99)
Total CHOL/HDL Ratio: 7.1 Ratio
Triglycerides: 211 mg/dL — ABNORMAL HIGH (ref <150)
VLDL: 42 mg/dL — ABNORMAL HIGH (ref 0–40)

## 2014-03-22 LAB — COMPREHENSIVE METABOLIC PANEL
ALBUMIN: 4.6 g/dL (ref 3.5–5.2)
ALK PHOS: 53 U/L (ref 39–117)
ALT: 26 U/L (ref 0–53)
AST: 26 U/L (ref 0–37)
BILIRUBIN TOTAL: 0.4 mg/dL (ref 0.2–1.2)
BUN: 14 mg/dL (ref 6–23)
CO2: 25 mEq/L (ref 19–32)
Calcium: 9.1 mg/dL (ref 8.4–10.5)
Chloride: 99 mEq/L (ref 96–112)
Creat: 1.39 mg/dL — ABNORMAL HIGH (ref 0.50–1.35)
GLUCOSE: 91 mg/dL (ref 70–99)
Potassium: 4.6 mEq/L (ref 3.5–5.3)
Sodium: 137 mEq/L (ref 135–145)
Total Protein: 7.6 g/dL (ref 6.0–8.3)

## 2014-03-22 NOTE — Telephone Encounter (Signed)
Called him back and discussed.  Would not recommend changing to a zpack especially as he is allergic to erythromycin.  Her understood

## 2014-03-22 NOTE — Telephone Encounter (Signed)
Patient called stated Dr. Patsy Lageropland prescribed him an antibiotic for his sinus. Stated Pharmacist informed him that he need a stronger prescription. Patient is requesting a Z-pac. Best contact [phone].

## 2014-03-23 ENCOUNTER — Encounter: Payer: Self-pay | Admitting: Family Medicine

## 2014-03-23 NOTE — Addendum Note (Signed)
Addended by: Abbe AmsterdamOPLAND, JESSICA C on: 03/23/2014 07:59 AM   Modules accepted: Orders

## 2014-03-31 ENCOUNTER — Telehealth: Payer: Self-pay | Admitting: Family Medicine

## 2014-03-31 NOTE — Telephone Encounter (Signed)
Spoke with patient. Advised patient to return to clinic in one month to recheck.

## 2014-03-31 NOTE — Telephone Encounter (Signed)
Patient states that on his AVS he is instructed to come back in one month to check his creatine levels and return in four months to check his cholesterol. Patient is unsure as to why and wants to know what he can do in the meantime to balance these levels.    [phone]

## 2014-04-23 ENCOUNTER — Other Ambulatory Visit: Payer: Self-pay | Admitting: Physician Assistant

## 2014-06-06 ENCOUNTER — Other Ambulatory Visit: Payer: Self-pay | Admitting: Emergency Medicine

## 2014-06-13 ENCOUNTER — Ambulatory Visit (INDEPENDENT_AMBULATORY_CARE_PROVIDER_SITE_OTHER): Payer: BC Managed Care – PPO | Admitting: Family Medicine

## 2014-06-13 VITALS — BP 143/90 | HR 83 | Temp 97.9°F | Ht 66.5 in | Wt 247.2 lb

## 2014-06-13 DIAGNOSIS — R0789 Other chest pain: Secondary | ICD-10-CM

## 2014-06-13 DIAGNOSIS — R079 Chest pain, unspecified: Secondary | ICD-10-CM

## 2014-06-13 DIAGNOSIS — R7303 Prediabetes: Secondary | ICD-10-CM

## 2014-06-13 DIAGNOSIS — R799 Abnormal finding of blood chemistry, unspecified: Secondary | ICD-10-CM

## 2014-06-13 DIAGNOSIS — K219 Gastro-esophageal reflux disease without esophagitis: Secondary | ICD-10-CM

## 2014-06-13 DIAGNOSIS — M949 Disorder of cartilage, unspecified: Secondary | ICD-10-CM

## 2014-06-13 DIAGNOSIS — R7989 Other specified abnormal findings of blood chemistry: Secondary | ICD-10-CM

## 2014-06-13 DIAGNOSIS — E8881 Metabolic syndrome: Secondary | ICD-10-CM

## 2014-06-13 DIAGNOSIS — I10 Essential (primary) hypertension: Secondary | ICD-10-CM

## 2014-06-13 DIAGNOSIS — Z79899 Other long term (current) drug therapy: Secondary | ICD-10-CM

## 2014-06-13 DIAGNOSIS — M899 Disorder of bone, unspecified: Secondary | ICD-10-CM

## 2014-06-13 DIAGNOSIS — R7309 Other abnormal glucose: Secondary | ICD-10-CM

## 2014-06-13 DIAGNOSIS — E785 Hyperlipidemia, unspecified: Secondary | ICD-10-CM

## 2014-06-13 LAB — CBC
HCT: 41 % (ref 39.0–52.0)
Hemoglobin: 13.5 g/dL (ref 13.0–17.0)
MCH: 24.2 pg — ABNORMAL LOW (ref 26.0–34.0)
MCHC: 32.9 g/dL (ref 30.0–36.0)
MCV: 73.5 fL — AB (ref 78.0–100.0)
PLATELETS: 277 10*3/uL (ref 150–400)
RBC: 5.58 MIL/uL (ref 4.22–5.81)
RDW: 15.4 % (ref 11.5–15.5)
WBC: 7.6 10*3/uL (ref 4.0–10.5)

## 2014-06-13 LAB — POCT GLYCOSYLATED HEMOGLOBIN (HGB A1C): Hemoglobin A1C: 6.1

## 2014-06-13 MED ORDER — MELOXICAM 7.5 MG PO TABS: 7.5000 mg | ORAL_TABLET | Freq: Two times a day (BID) | ORAL | Status: DC | PRN

## 2014-06-13 MED ORDER — OMEPRAZOLE 40 MG PO CPDR: 40.0000 mg | DELAYED_RELEASE_CAPSULE | Freq: Every day | ORAL | Status: DC

## 2014-06-13 NOTE — Patient Instructions (Addendum)
Diabetes, Eating Away From Home Sometimes, you might eat in a restaurant or have meals that are prepared by someone else. You can enjoy eating out. However, the portions in restaurants may be much larger than needed. Listed below are some ideas to help you choose foods that will keep your blood glucose (sugar) in better control.  TIPS FOR EATING OUT  Know your meal plan and how many carbohydrate servings you should have at each meal. You may wish to carry a copy of your meal plan in your purse or wallet. Learn the foods included in each food group.  Make a list of restaurants near you that offer healthy choices. Take a copy of the carry-out menus to see what they offer. Then, you can plan what you will order ahead of time.  Become familiar with serving sizes by practicing them at home using measuring cups and spoons. Once you learn to recognize portion sizes, you will be able to correctly estimate the amount of total carbohydrate you are allowed to eat at the restaurant. Ask for a takeout box if the portion is more than you should have. When your food comes, leave the amount you should have on the plate, and put the rest in the takeout box before you start eating.  Plan ahead if your mealtime will be different from usual. Check with your caregiver to find out how to time meals and medicine if you are taking insulin.  Avoid high-fat foods, such as fried foods, cream sauces, high-fat salad dressings, or any added butter or margarine.  Do not be afraid to ask questions. Ask your server about the portion size, cooking methods, ingredients and if items can be substituted. Restaurants do not list all available items on the menu. You can ask for your main entree to be prepared using skim milk, oil instead of butter or margarine, and without gravy or sauces. Ask your waiter or waitress to serve salad dressings, gravy, sauces, margarine, and sour cream on the side. You can then add the amount your meal plan  suggests.  Add more vegetables whenever possible.  Avoid items that are labeled "jumbo," "giant," "deluxe," or "supersized."  You may want to split an entre with someone and order an extra side salad.  Watch for hidden calories in foods like croutons, bacon, or cheese.  Ask your server to take away the bread basket or chips from your table.  Order a dinner salad as an appetizer. You can eat most foods served in a restaurant. Some foods are better choices than others. Breads and Starches  Recommended: All kinds of bread (wheat, rye, white, oatmeal, Italian, French, raisin), hard or soft dinner rolls, frankfurter or hamburger buns, small bagels, small corn or whole-wheat flour tortillas.  Avoid: Frosted or glazed breads, butter rolls, egg or cheese breads, croissants, sweet rolls, pastries, coffee cake, glazed or frosted doughnuts, muffins. Crackers  Recommended: Animal crackers, graham, rye, saltine, oyster, and matzoth crackers. Bread sticks, melba toast, rusks, pretzels, popcorn (without fat), zwieback toast.  Avoid: High-fat snack crackers or chips. Buttered popcorn. Cereals  Recommended: Hot and cold cereals. Whole grains such as oatmeal or shredded wheat are good choices.  Avoid: Sugar-coated or granola type cereals. Potatoes/Pasta/Rice/Beans  Recommended: Order baked, boiled, or mashed potatoes, rice or noodles without added fat, whole beans. Order gravies, butter, margarine, or sauces on the side so you can control the amount you add.  Avoid: Hash browns or fried potatoes. Potatoes, pasta, or rice prepared with cream or   cheese sauce. Potato or pasta salads prepared with large amounts of dressing. Fried beans or fried rice. Vegetables  Recommended: Order steamed, baked, boiled, or stewed vegetables without sauces or extra fat. Ask that sauce be served on the side. If vegetables are not listed on the menu, ask what is available.  Avoid: Vegetables prepared with cream,  butter, or cheese sauce. Fried vegetables. Salad Bars  Recommended: Many of the vegetables at a salad bar are considered "free." Use lemon juice, vinegar, or low-calorie salad dressing (fewer than 20 calories per serving) as "free" dressings for your salad. Look for salad bar ingredients that have no added fat or sugar such as tomatoes, lettuce, cucumbers, broccoli, carrots, onions, and mushrooms.  Avoid: Prepared salads with large amounts of dressing, such as coleslaw, caesar salad, macaroni salad, bean salad, or carrot salad. Fruit  Recommended: Eat fresh fruit or fresh fruit salad without added dressing. A salad bar often offers fresh fruit choices, but canned fruit at a restaurant is usually packed in sugar or syrup.  Avoid: Sweetened canned or frozen fruits, plain or sweetened fruit juice. Fruit salads with dressing, sour cream, or sugar added to them. Meat and Meat Substitutes  Recommended: Order broiled, baked, roasted, or grilled meat, poultry, or fish. Trim off all visible fat. Do not eat the skin of poultry. The size stated on the menu is the raw weight. Meat shrinks by  in cooking (for example, 4 oz raw equals 3 oz cooked meat).  Avoid: Deep-fat fried meat, poultry, or fish. Breaded meats. Eggs  Recommended: Order soft, hard-cooked, poached, or scrambled eggs. Omelets may be okay, depending on what ingredients are added. Egg substitutes are also a good choice.  Avoid: Fried eggs, eggs prepared with cream or cheese sauce. Milk  Recommended: Order low-fat or fat-free milk according to your meal plan. Plain, nonfat yogurt or flavored yogurt with no sugar added may be used as a substitute for milk. Soy milk may also be used.  Avoid: Milk shakes or sweetened milk beverages. Soups and Combination Foods  Recommended: Clear broth or consomm are "free" foods and may be used as an appetizer. Broth-based soups with fat removed count as a starch serving and are preferred over cream  soups. Soups made with beans or split peas may be eaten but count as a starch.  Avoid: Fatty soups, soup made with cream, cheese soup. Combination foods prepared with excessive amounts of fat or with cream or cheese sauces. Desserts and Sweets  Recommended: Ask for fresh fruit. Sponge or angel food cake without icing, ice milk, no sugar added ice cream, sherbet, or frozen yogurt may fit into your meal plan occasionally.  Avoid: Pastries, puddings, pies, cakes with icing, custard, gelatin desserts. Fats and Oils  Recommended: Choose healthy fats such as olive oil, canola oil, or tub margarine, reduced fat or fat-free sour cream, cream cheese, avocado, or nuts.  Avoid: Any fats in excess of your allowed portion. Deep-fried foods or any food with a large amount of fat. Note: Ask for all fats to be served on the side, and limit your portion sizes according to your meal plan. Document Released: 12/02/2005 Document Revised: 02/24/2012 Document Reviewed: 06/22/2009 [facility] Patient Information 2015 [employer], [place]. This information is not intended to replace advice given to you by your health care provider. Make sure you discuss any questions you have with your health care provider. Insulin Resistance Blood sugar (glucose) levels are controlled by a hormone called insulin. Insulin is made by your  pancreas. When your blood glucose goes up, insulin is released into your blood. Insulin is required for your body to function normally. However, your body can become resistant to your own insulin or to insulin given to treat diabetes. In either case, insulin resistance can lead to serious problems. These problems include:  Type 2 diabetes.  Heart disease.  High blood pressure.  Stroke.  Polycystic ovary syndrome.  Fatty liver. CAUSES  Insulin resistance can develop for many different reasons. It is more likely to happen in people with these conditions or  characteristics:  Obesity.  Inactivity.  Pregnancy.  High blood pressure.  Stress.  Steroid use.  Infection or severe illness.  Increased levels of cholesterol and triglycerides. SYMPTOMS  There are no symptoms. You may have symptoms related to the various complications of insulin resistance.  DIAGNOSIS  Several different things can make your caregiver suspect you have insulin resistance. These include:  High blood glucose (hyperglycemia).  Abnormal cholesterol levels.  High uric acid levels.  Changes related to blood pressure.  Changes related to inflammation. Insulin resistance can be determined with blood tests. An elevated insulin level when you have not eaten might suggest resistance. Other more complicated tests are sometimes necessary. TREATMENT  Lifestyle changes are the most important treatment for insulin resistance.   If you are overweight and you have insulin resistance, you can improve your insulin sensitivity by losing weight.  Moderate exercise for 30-40 minutes, 4 days a week, can improve insulin sensitivity. Some medicines can also help improve your insulin sensitivity. Your caregiver can discuss these with you if they are appropriate.  HOME CARE INSTRUCTIONS   Do not smoke.  Keep your weight at a healthy level.  Get exercise.  If you have diabetes, follow your caregiver's directions.  If you have high blood pressure, follow your caregiver's directions.  Only take prescription medicines for pain, fever, or discomfort as directed by your caregiver. SEEK MEDICAL CARE IF:   You are diabetic and you are having problems keeping your blood glucose levels at target range.  You are having episodes of low blood glucose (hypoglycemia).  You feel you might be having side effects from your medicines.  You have symptoms of an illness that is not improving after 3-4 days.  You have a sore or wound that is not healing.  You notice a change in vision  or a new problem with your vision. SEEK IMMEDIATE MEDICAL CARE IF:   Your blood glucose goes below 70, especially if you have confusion, lightheadedness, or other symptoms with it.  Your blood glucose is very high (as advised by your caregiver) twice in a row.  You pass out.  You have chest pain or trouble breathing.  You have a sudden, severe headache.  You have sudden weakness in one arm or one leg.  You have sudden difficulty speaking or swallowing.  You develop vomiting or diarrhea that is getting worse or not improving after 1 day. Document Released: 01/21/2006 Document Revised: 06/02/2012 Document Reviewed: 05/13/2013 [facility] Patient Information 2015 [place], [place]. This information is not intended to replace advice given to you by your health care provider. Make sure you discuss any questions you have with your health care provider.

## 2014-06-13 NOTE — Progress Notes (Addendum)
Subjective:    Patient ID: Walter MessickIsaiah L Burgeson Jr., male    DOB: 06-08-1971, 43 y.o.   MRN: [id number] This chart was scribed for Norberto SorensonEva Shaw, MD by Nicholos Johnsenise Iheanachor, Medical Scribe. The patient was seen in room 8. This patient's care was started at 7:01 PM.  Chief Complaint  Patient presents with  . Follow-up    elevated kidney function/ tick bite   HPI HPI Comments: Walter Messicksaiah L Smullen Jr. is a 43 y.o. male who [facility] multiple complaints. Takes Lisinopril 20 qd for blood pressure and Tricor for cholesterol. Does not check BP at home. 2 months ago, Triglycerides were elevated to 11 with LDL of 128. Creatine elvated at 1.39. No changes were made at that time. Recommended to return to clinic for recheck in 1 month.   Today would like to check creatinine level. States he knows he needs to drink more water. Drinking a lot of cranberry juice and trying bottled water as an alternative for drinking soda. Has put on weight. Admits to eating candy in excess; Coca[employer] Ranchers in particular. Last ate Brett Albinoizza today approximately 3.5 hours ago.  Reports he has had a sharp non-radiating chest pain for the past couple of months. Pain worse with deep breathing. Episodes will last a few minutes; usually occuring 2-3 times or less per week. Some intermittent nausea. Does report indigestion or heart burn; not treated with prescription medication. Worse at night; treating with Tums as needed. Not increasing in frequency but does leave him SOB. Feeling SOB 2-3 a week; not always accompanied by chest pain. Episodes will last a couple of minutes at a time. States he had a strange feeling in [facility] Uses CPAP machine at home. No hx of smoking. Denies chronic cough, or diaphoresis.   States area around xiphoid process is always sore. Unsure if this is due to recent weight gain or not.  Also complains of a tick bite occuring 3 weeks on his back that has since started  itching and gotten darker in color. States tick was brown with a white spot on the back; different from prior ticks he has been bitten by.   Has had carpel tunnel in bilateral hands. States pain is returning and becoming unbearable. Has braces at home but does not use because they are too tight. Will wake up in the middle of the night due to numbness in his hands. Has had surgery on the right wrist already; none on the left. Has had cortisone injections for trigger finger in right thumb. Believes he is also getting trigger finger in 3rd and 4th fingers of the right hand. States when he is driving he has to switch hands due to pain.  Patient Active Problem List   Diagnosis Date Noted  . Unspecified sleep apnea 11/17/2013   Past Medical History  Diagnosis Date  . Hernia     bi lateral hernia repair  . Hypertension   . Elevated cholesterol   . Allergy    History reviewed. No pertinent past surgical history. Allergies  Allergen Reactions  . Erythromycin Base    Prior to Admission medications   Medication Sig Start Date End Date Taking? Authorizing Provider  fenofibrate (TRICOR) 145 MG tablet TAKE 1 TABLET (145 MG TOTAL) BY MOUTH DAILY.   Yes Gwenlyn FoundJessica C Copland, MD  lisinopril (PRINIVIL,ZESTRIL) 20 MG tablet TAKE 1 TABLET (20 MG TOTAL) BY MOUTH DAILY.   Yes Gwenlyn FoundJessica C  Copland, MD   History   Social History  . Marital Status: Married    Spouse Name: N/A    Number of Children: N/A  . Years of Education: N/A   Occupational History  . Not on file.   Social History Main Topics  . Smoking status: Never Smoker   . Smokeless tobacco: Not on file  . Alcohol Use: No  . Drug Use: No  . Sexual Activity: Yes    Birth Control/ Protection: Condom   Other Topics Concern  . Not on file   Social History Narrative  . No narrative on file    Review of Systems  Constitutional: Negative for diaphoresis.  Respiratory: Positive for shortness of breath. Negative for cough.   Cardiovascular:  Negative for chest pain.  Gastrointestinal: Negative for nausea.  Musculoskeletal: Positive for arthralgias.  Skin: Positive for rash.    Triage vitals: BP 145/81  Pulse 93  Temp(Src) 97.9 F (36.6 C) (Oral)  Ht 5' 6.5" (1.689 m)  Wt 247 lb 3.2 oz (112.129 kg)  BMI 39.31 kg/m2  SpO2 95%   Objective:  Physical Exam  Vitals reviewed. Constitutional: He is oriented to person, place, and time. He appears well-developed and well-nourished. No distress.  HENT:  Head: Normocephalic and atraumatic.  Eyes: Conjunctivae and EOM are normal.  Neck: Neck supple. No tracheal deviation present.  Cardiovascular: Regular rhythm, S1 normal, S2 normal and normal heart sounds.  Tachycardia present.  Exam reveals no gallop and no friction rub.   No murmur heard. Pulmonary/Chest: Effort normal and breath sounds normal. No respiratory distress. He has no wheezes. He has no rales.  Musculoskeletal: Normal range of motion.  Neurological: He is alert and oriented to person, place, and time.  Skin: Skin is warm and dry.  Psychiatric: He has a normal mood and affect. His behavior is normal.   EKG: NSR, no ischemic changes Assessment & Plan:   Chest pain, unspecified - Plan: EKG 12-Lead, POCT glycosylated hemoglobin (Hb A1C), CBC, BASIC METABOLIC PANEL WITH GFR  Encounter for long-term (current) use of other medications - Plan: EKG 12-Lead, POCT glycosylated hemoglobin (Hb A1C), CBC, BASIC METABOLIC PANEL WITH GFR  Elevated serum creatinine - Plan: EKG 12-Lead, POCT glycosylated hemoglobin (Hb A1C), CBC, BASIC METABOLIC PANEL WITH GFR  Gastroesophageal reflux disease, esophagitis presence not specified - Plan: EKG 12-Lead, POCT glycosylated hemoglobin (Hb A1C), CBC, BASIC METABOLIC PANEL WITH GFR  Xyphoidalgia - Plan: EKG 12-Lead, POCT glycosylated hemoglobin (Hb A1C), CBC, BASIC METABOLIC PANEL WITH GFR  Other and unspecified hyperlipidemia - Plan: EKG 12-Lead, POCT glycosylated hemoglobin (Hb A1C),  CBC, BASIC METABOLIC PANEL WITH GFR  Essential hypertension, benign - Plan: EKG 12-Lead, POCT glycosylated hemoglobin (Hb A1C), CBC, BASIC METABOLIC PANEL WITH GFR  Prediabetes  Metabolic syndrome  Meds ordered this encounter  Medications  . omeprazole (PRILOSEC) 40 MG capsule    Sig: Take 1 capsule (40 mg total) by mouth daily.    Dispense:  30 capsule    Refill:  3  . meloxicam (MOBIC) 7.5 MG tablet    Sig: Take 1 tablet (7.5 mg total) by mouth 2 (two) times daily as needed for pain (sternal pain).    Dispense:  30 tablet    Refill:  1    I personally performed the services described in this documentation, which was scribed in my presence. The recorded information has been reviewed and considered, and addended by me as needed.  Norberto SorensonEva Shaw, MD MPH  Over 40 minutes spent in consultation and evaluation with patient and coordination of patient care.

## 2014-06-14 LAB — BASIC METABOLIC PANEL WITH GFR
BUN: 18 mg/dL (ref 6–23)
CHLORIDE: 103 meq/L (ref 96–112)
CO2: 28 meq/L (ref 19–32)
CREATININE: 1.08 mg/dL (ref 0.50–1.35)
Calcium: 9.3 mg/dL (ref 8.4–10.5)
GFR, Est African American: >89 mL/min
GFR, Est Non African American: 83 mL/min
Glucose, Bld: 149 mg/dL — ABNORMAL HIGH (ref 70–99)
POTASSIUM: 4.4 meq/L (ref 3.5–5.3)
Sodium: 139 mEq/L (ref 135–145)

## 2014-06-14 NOTE — Progress Notes (Signed)
Scheduled appointment for September 19 at 8 for fasting labs

## 2014-07-02 ENCOUNTER — Ambulatory Visit (INDEPENDENT_AMBULATORY_CARE_PROVIDER_SITE_OTHER): Payer: BC Managed Care – PPO | Admitting: Physician Assistant

## 2014-07-02 VITALS — BP 134/82 | HR 81 | Temp 98.4°F | Resp 16 | Ht 67.0 in | Wt 237.0 lb

## 2014-07-02 DIAGNOSIS — J029 Acute pharyngitis, unspecified: Secondary | ICD-10-CM

## 2014-07-02 DIAGNOSIS — Z23 Encounter for immunization: Secondary | ICD-10-CM

## 2014-07-02 DIAGNOSIS — K122 Cellulitis and abscess of mouth: Secondary | ICD-10-CM

## 2014-07-02 LAB — POCT RAPID STREP A (OFFICE): Rapid Strep A Screen: NEGATIVE

## 2014-07-02 MED ORDER — AMOXICILLIN 875 MG PO TABS: 875.0000 mg | ORAL_TABLET | Freq: Two times a day (BID) | ORAL | Status: DC

## 2014-07-02 MED ORDER — FIRST-DUKES MOUTHWASH MT SUSP: 5.0000 mL | OROMUCOSAL | Status: DC | PRN

## 2014-07-02 NOTE — Progress Notes (Signed)
   Subjective:    Patient ID: Walter MalesIsaiah Badeaux Jr., male    DOB: 1971/01/17, 43 y.o.   MRN: [id number]  HPI 43 year old male presents for evaluation of 4-5 day history of "irritated" feeling in his mouth along with sore throat. Symptoms have been progressively worsening. He describes a dry feeling in his mouth and difficulty swallowing. Also admits to a dry cough. No nasal congestion, fever, chills, nausea, vomiting, headache, or abdominal pain. Has taken several doses of benadryl that seemed to help somewhat. No other OTC tx tried.  Hx of GERD - taking prilosec.  Patient also requesting Tdap because his wife is having a baby in August.     Review of Systems  Constitutional: Negative for fever and chills.  HENT: Positive for sore throat. Negative for congestion and ear pain.   Respiratory: Positive for cough. Negative for shortness of breath and wheezing.   Gastrointestinal: Negative for nausea, vomiting and abdominal pain.  Neurological: Negative for headaches.       Objective:   Physical Exam  Constitutional: He is oriented to person, place, and time. He appears well-developed and well-nourished.  HENT:  Head: Normocephalic and atraumatic.  Right Ear: Hearing, tympanic membrane, external ear and ear canal normal.  Left Ear: Hearing, tympanic membrane, external ear and ear canal normal.  Mouth/Throat: Uvula is midline and mucous membranes are normal. Uvula swelling present. Posterior oropharyngeal erythema present. No oropharyngeal exudate, posterior oropharyngeal edema or tonsillar abscesses.  Eyes: Conjunctivae are normal.  Neck: Normal range of motion. Neck supple.  Cardiovascular: Normal rate, regular rhythm and normal heart sounds.   Pulmonary/Chest: Effort normal and breath sounds normal.  Lymphadenopathy:    He has no cervical adenopathy.  Neurological: He is alert and oriented to person, place, and time.  Psychiatric: He has a normal mood and affect. His behavior is normal.  Judgment and thought content normal.      Results for orders placed in visit on 07/02/14  POCT RAPID STREP A (OFFICE)      Result Value Ref Range   Rapid Strep A Screen Negative  Negative       Assessment & Plan:  Uvulitis - Plan: amoxicillin (AMOXIL) 875 MG tablet  Acute pharyngitis, unspecified pharyngitis type - Plan: POCT rapid strep A, Diphenhyd-Hydrocort-Nystatin (FIRST-DUKES MOUTHWASH) SUSP, Culture, Group A Strep  Need for Tdap vaccination - Plan: Tdap vaccine greater than or equal to 7yo IM  Rapid strep negative but patient does have uvulitis so will cover for strep with amoxicillin 875 mg bid x 10 days [facility] mouthwash q2-3hours prn discomfort Push fluids Throat culture pending RTC precautions discussed. F/u if symptoms worsen or fail to improve.

## 2014-07-04 LAB — CULTURE, GROUP A STREP: Organism ID, Bacteria: NORMAL

## 2014-08-29 ENCOUNTER — Ambulatory Visit: Payer: Self-pay | Admitting: Family Medicine

## 2014-09-20 ENCOUNTER — Ambulatory Visit (INDEPENDENT_AMBULATORY_CARE_PROVIDER_SITE_OTHER): Payer: BC Managed Care – PPO

## 2014-09-20 ENCOUNTER — Ambulatory Visit (INDEPENDENT_AMBULATORY_CARE_PROVIDER_SITE_OTHER): Payer: BC Managed Care – PPO | Admitting: Family Medicine

## 2014-09-20 VITALS — BP 130/80 | HR 81 | Temp 97.3°F | Resp 16 | Ht 67.0 in | Wt 240.6 lb

## 2014-09-20 DIAGNOSIS — R0789 Other chest pain: Secondary | ICD-10-CM

## 2014-09-20 DIAGNOSIS — Z1329 Encounter for screening for other suspected endocrine disorder: Secondary | ICD-10-CM

## 2014-09-20 DIAGNOSIS — R072 Precordial pain: Secondary | ICD-10-CM

## 2014-09-20 DIAGNOSIS — R202 Paresthesia of skin: Secondary | ICD-10-CM

## 2014-09-20 DIAGNOSIS — M949 Disorder of cartilage, unspecified: Secondary | ICD-10-CM

## 2014-09-20 DIAGNOSIS — J209 Acute bronchitis, unspecified: Secondary | ICD-10-CM

## 2014-09-20 DIAGNOSIS — M6740 Ganglion, unspecified site: Secondary | ICD-10-CM

## 2014-09-20 DIAGNOSIS — Z13220 Encounter for screening for lipoid disorders: Secondary | ICD-10-CM

## 2014-09-20 DIAGNOSIS — I10 Essential (primary) hypertension: Secondary | ICD-10-CM

## 2014-09-20 DIAGNOSIS — G5602 Carpal tunnel syndrome, left upper limb: Secondary | ICD-10-CM

## 2014-09-20 DIAGNOSIS — G5601 Carpal tunnel syndrome, right upper limb: Secondary | ICD-10-CM

## 2014-09-20 DIAGNOSIS — G5603 Carpal tunnel syndrome, bilateral upper limbs: Secondary | ICD-10-CM

## 2014-09-20 LAB — POCT CBC
Granulocyte percent: 49 %G (ref 37–80)
HCT, POC: 45.6 % (ref 43.5–53.7)
Hemoglobin: 13.8 g/dL — AB (ref 14.1–18.1)
Lymph, poc: 4 — AB (ref 0.6–3.4)
MCH, POC: 23.4 pg — AB (ref 27–31.2)
MCHC: 30.4 g/dL — AB (ref 31.8–35.4)
MCV: 77 fL — AB (ref 80–97)
MID (cbc): 0.6 (ref 0–0.9)
MPV: 8.1 fL (ref 0–99.8)
POC Granulocyte: 4.4 (ref 2–6.9)
POC LYMPH PERCENT: 43.9 %L (ref 10–50)
POC MID %: 7.1 %M (ref 0–12)
Platelet Count, POC: 286 10*3/uL (ref 142–424)
RBC: 5.92 M/uL (ref 4.69–6.13)
RDW, POC: 16.2 %
WBC: 9.8 10*3/uL (ref 4.6–10.2)

## 2014-09-20 MED ORDER — LISINOPRIL 20 MG PO TABS: ORAL_TABLET | ORAL | Status: DC

## 2014-09-20 MED ORDER — AMOXICILLIN-POT CLAVULANATE 875-125 MG PO TABS: 1.0000 | ORAL_TABLET | Freq: Two times a day (BID) | ORAL | Status: DC

## 2014-09-20 NOTE — Patient Instructions (Addendum)
Acute Bronchitis °Bronchitis is inflammation of the airways that extend from the windpipe into the lungs (bronchi). The inflammation often causes mucus to develop. This leads to a cough, which is the most common symptom of bronchitis.  °In acute bronchitis, the condition usually develops suddenly and goes away over time, usually in a couple weeks. Smoking, allergies, and asthma can make bronchitis worse. Repeated episodes of bronchitis may cause further lung problems.  °CAUSES °Acute bronchitis is most often caused by the same virus that causes a cold. The virus can spread from person to person (contagious) through coughing, sneezing, and touching contaminated objects. °SIGNS AND SYMPTOMS  °· Cough.   °· Fever.   °· Coughing up mucus.   °· Body aches.   °· Chest congestion.   °· Chills.   °· Shortness of breath.   °· Sore throat.   °DIAGNOSIS  °Acute bronchitis is usually diagnosed through a physical exam. Your health care provider will also ask you questions about your medical history. Tests, such as chest X-rays, are sometimes done to rule out other conditions.  °TREATMENT  °Acute bronchitis usually goes away in a couple weeks. Oftentimes, no medical treatment is necessary. Medicines are sometimes given for relief of fever or cough. Antibiotic medicines are usually not needed but may be prescribed in certain situations. In some cases, an inhaler may be recommended to help reduce shortness of breath and control the cough. A cool mist vaporizer may also be used to help thin bronchial secretions and make it easier to clear the chest.  °HOME CARE INSTRUCTIONS °· Get plenty of rest.   °· Drink enough fluids to keep your urine clear or pale yellow (unless you have a medical condition that requires fluid restriction). Increasing fluids may help thin your respiratory secretions (sputum) and reduce chest congestion, and it will prevent dehydration.   °· Take medicines only as directed by your health care provider. °· If  you were prescribed an antibiotic medicine, finish it all even if you start to feel better. °· Avoid smoking and secondhand smoke. Exposure to cigarette smoke or irritating chemicals will make bronchitis worse. If you are a smoker, consider using nicotine gum or skin patches to help control withdrawal symptoms. Quitting smoking will help your lungs heal faster.   °· Reduce the chances of another bout of acute bronchitis by washing your hands frequently, avoiding people with cold symptoms, and trying not to touch your hands to your mouth, nose, or eyes.   °· Keep all follow-up visits as directed by your health care provider.   °SEEK MEDICAL CARE IF: °Your symptoms do not improve after 1 week of treatment.  °SEEK IMMEDIATE MEDICAL CARE IF: °· You develop an increased fever or chills.   °· You have chest pain.   °· You have severe shortness of breath. °· You have bloody sputum.   °· You develop dehydration. °· You faint or repeatedly feel like you are going to pass out. °· You develop repeated vomiting. °· You develop a severe headache. °MAKE SURE YOU:  °· Understand these instructions. °· Will watch your condition. °· Will get help right away if you are not doing well or get worse. °Document Released: 01/09/2005 Document Revised: 04/18/2014 Document Reviewed: 05/25/2013 °ExitCare® Patient Information ©2015 [employer]. This information is not intended to replace advice given to you by your health care provider. Make sure you discuss any questions you have with your health care provider. ° °Chest Pain (Nonspecific) °It is often hard to give a specific diagnosis for the cause of chest pain. There is always a chance   that your pain could be related to something serious, such as a heart attack or a blood clot in the lungs. You need to follow up with your health care provider for further evaluation. °CAUSES  °· Heartburn. °· Pneumonia or bronchitis. °· Anxiety or stress. °· Inflammation around your heart (pericarditis) or  lung (pleuritis or pleurisy). °· A blood clot in the lung. °· A collapsed lung (pneumothorax). It can develop suddenly on its own (spontaneous pneumothorax) or from trauma to the chest. °· Shingles infection (herpes zoster virus). °The chest wall is composed of bones, muscles, and cartilage. Any of these can be the source of the pain. °· The bones can be bruised by injury. °· The muscles or cartilage can be strained by coughing or overwork. °· The cartilage can be affected by inflammation and become sore (costochondritis). °DIAGNOSIS  °Lab tests or other studies may be needed to find the cause of your pain. Your health care provider may have you take a test called an ambulatory electrocardiogram (ECG). An ECG records your heartbeat patterns over a 24-hour period. You may also have other tests, such as: °· Transthoracic echocardiogram (TTE). During echocardiography, sound waves are used to evaluate how blood flows through your heart. °· Transesophageal echocardiogram (TEE). °· Cardiac monitoring. This allows your health care provider to monitor your heart rate and rhythm in real time. °· Holter monitor. This is a portable device that records your heartbeat and can help diagnose heart arrhythmias. It allows your health care provider to track your heart activity for several days, if needed. °· Stress tests by exercise or by giving medicine that makes the heart beat faster. °TREATMENT  °· Treatment depends on what may be causing your chest pain. Treatment may include: °¨ Acid blockers for heartburn. °¨ Anti-inflammatory medicine. °¨ Pain medicine for inflammatory conditions. °¨ Antibiotics if an infection is present. °· You may be advised to change lifestyle habits. This includes stopping smoking and avoiding alcohol, caffeine, and chocolate. °· You may be advised to keep your head raised (elevated) when sleeping. This reduces the chance of acid going backward from your stomach into your esophagus. °Most of the time,  nonspecific chest pain will improve within 2-3 days with rest and mild pain medicine.  °HOME CARE INSTRUCTIONS  °· If antibiotics were prescribed, take them as directed. Finish them even if you start to feel better. °· For the next few days, avoid physical activities that bring on chest pain. Continue physical activities as directed. °· Do not use any tobacco products, including cigarettes, chewing tobacco, or electronic cigarettes. °· Avoid drinking alcohol. °· Only take medicine as directed by your health care provider. °· Follow your health care provider's suggestions for further testing if your chest pain does not go away. °· Keep any follow-up appointments you made. If you do not go to an appointment, you could develop lasting (chronic) problems with pain. If there is any problem keeping an appointment, call to reschedule. °SEEK MEDICAL CARE IF:  °· Your chest pain does not go away, even after treatment. °· You have a rash with blisters on your chest. °· You have a fever. °SEEK IMMEDIATE MEDICAL CARE IF:  °· You have increased chest pain or pain that spreads to your arm, neck, jaw, back, or abdomen. °· You have shortness of breath. °· You have an increasing cough, or you cough up blood. °· You have severe back or abdominal pain. °· You feel nauseous or vomit. °· You have severe weakness. °·   You faint. °· You have chills. °This is an emergency. Do not wait to see if the pain will go away. Get medical help at once. Call your local emergency services (911 in [place] Do not drive yourself to the hospital. °MAKE SURE YOU:  °· Understand these instructions. °· Will watch your condition. °· Will get help right away if you are not doing well or get worse. °Document Released: 09/11/2005 Document Revised: 12/07/2013 Document Reviewed: 07/07/2008 °ExitCare® Patient Information ©2015 [employer]. This information is not intended to replace advice given to you by your health care provider. Make sure you discuss any  questions you have with your health care provider. ° °

## 2014-09-20 NOTE — Progress Notes (Signed)
Chief Complaint:  Chief Complaint  Patient presents with  . Chest Pain    few days; pt states pain is midsternal then radiates under breast area; pt states it feels like something sharp is sticking him; denies fever/chills  . Numbness    pt states when he wakes in the morning both arms/hands are numb; stay numb for a few minutes;  carpal tunnel surgery on right hand    HPI: Walter Tsou. is a 43 y.o. male who is here for :  1. Chest pain-midsubsternal area, like a burning sticking, sharp sensation, it has been like this all day reaagrdless , it has been like this for the last 3 days. Radiates bilaterally to both sides,he has pain when he presses on his xiphoid as well.  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 He probably gets about 2-3 hrs of sleep, has a newborn about 35 weeks old and a 43 y/o . He denies GERD but has probably GERD sxs. CP radiates to both sides, he deenies straining anything, he denies any HA, vision changes, SOB, palpitations, clamminess, nausea, vomiting, abd pain. He has recently had a cold as well, when he coughs the pain is worse, he has had a dry cough.  He denies having CP like this before. He has hyperlipidmeia,NO DM, HTN, and . The pain has been consistent. He has not taken any aspirin today. Has cold sxs about 1 month ago, he has a constant cough sometimes, it is irritating. 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 He has bialteral numbness and tingling im his wrist, feels like carpal tunnel, he has had a carpal tunnel surgery onhis right hand , he states he has had carpel tunnel and trigger finger, the fingers are numb every morning. The numbness is not new for him. He was supposed to see a specialist but never got referred per patient.   3. He has joint pain. Wants to know if related to  his cholesterol medicine. He has already eaten at lunch time about 12 so it is about 7 hrs fasting   Past Medical History  Diagnosis Date  . Hernia     bi lateral hernia repair  . Hypertension   . Elevated cholesterol   . Allergy    History reviewed. No pertinent past surgical history. History   Social History  . Marital Status: Married    Spouse Name: N/A    Number of Children: N/A  . Years of Education: N/A   Social History Main Topics  . Smoking status: Never Smoker   . Smokeless tobacco: None  . Alcohol Use: No  . Drug Use: No  . Sexual Activity: Yes    Birth Control/ Protection: Condom   Other Topics Concern  . None   Social History Narrative  . None   Family History  Problem Relation Age of Onset  . Diabetes Mother   . Stroke Mother     4 strokes  . Hypertension Father   . Hypertension Sister   . Heart disease Sister     pacemaker   Allergies  Allergen Reactions  . Erythromycin Base    Prior to Admission medications   Medication Sig Start Date End Date Taking? Authorizing Provider  fenofibrate (TRICOR) 145 MG  tablet TAKE 1 TABLET (145 MG TOTAL) BY MOUTH DAILY.   Yes Gwenlyn FoundJessica C Copland, MD  lisinopril (PRINIVIL,ZESTRIL) 20 MG tablet TAKE 1 TABLET (20 MG TOTAL) BY MOUTH DAILY.   Yes Gwenlyn FoundJessica C Copland, MD     ROS: The patient denies fevers, chills, night sweats, unintentional weight loss,  palpitations, wheezing, dyspnea on exertion, nausea, vomiting, abdominal pain, dysuria, hematuria, melena  All other systems have been reviewed and were otherwise negative with the exception of those mentioned in the HPI and as above.    PHYSICAL EXAM: Filed Vitals:   09/20/14 1741  BP: 130/80  Pulse: 81  Temp: 97.3 F (36.3 C)  Resp: 16   Filed Vitals:   09/20/14 1741  Height: 5\' 7"  (1.702 m)  Weight: 240 lb 9.6 oz (109.135 kg)   Body mass index is 37.67 kg/(m^2).  General: Alert, no acute distress HEENT:  Normocephalic, atraumatic, oropharynx patent.  EOMI, PERRLA Cardiovascular:  Regular rate and rhythm, no rubs murmurs or gallops.  No Carotid bruits, radial pulse intact. No pedal edema.  Respiratory: Clear to auscultation bilaterally.  No wheezes, rales, or rhonchi.  No cyanosis, no use of accessory musculature GI: No organomegaly, abdomen is soft and non-tender, positive bowel sounds.  No masses. Skin: No rashes. Neurologic: Facial musculature symmetric. Psychiatric: Patient is appropriate throughout our interaction. Lymphatic: No cervical lymphadenopathy Musculoskeletal: Gait intact. Neg phalens, neg tinels,+ ganglion left wrist 5/5 wrist and finger and grip strength, sensation intact   LABS: Results for orders placed in visit on 09/20/14  POCT CBC      Result Value Ref Range   WBC 9.8  4.6 - 10.2 K/uL   Lymph, poc 4.0 (*) 0.6 - 3.4   POC LYMPH PERCENT 43.9  10 - 50 %L   MID (cbc) 0.6  0 - 0.9   POC MID % 7.1  0 - 12 %M   POC Granulocyte 4.4  2 - 6.9   Granulocyte percent 49.0  37 - 80 %G   RBC 5.92  4.69 - 6.13 M/uL   Hemoglobin 13.8 (*) 14.1 - 18.1 g/dL   HCT, POC 57.845.6  46.943.5 - 53.7 %   MCV 77.0 (*) 80 - 97 fL   MCH, POC 23.4 (*) 27 - 31.2 pg   MCHC 30.4 (*) 31.8 - 35.4 g/dL   RDW, POC 62.916.2     Platelet Count, POC 286  142 - 424 K/uL   MPV 8.1  0 - 99.8 fL     EKG/XRAY:   [facility]. Bronchitic changes, no acute cardiopulmonary issues EKG NSR  ASSESSMENT/PLAN: Encounter Diagnoses  Name Primary?  . Chest pain, midsternal Yes  . Screening for hyperlipidemia   . Screening for thyroid disorder   . Ganglion cyst   . Paresthesia of hand, bilateral   . Bilateral carpal tunnel syndrome   . Acute bronchitis, unspecified organism   . Essential hypertension   . Xiphoid pain    Walter Reynolds is a pleasant 43 y.o AA male with a PMH of well ocntrolled HTN, hyperlipidemia and also OSA ( minimally compliant since he does not sleep much) who is here with multiple complaints. Refilled  medications Chest pain maybe multifactorial including GERD, pleuritic in origin but he has Risk Factors for CAD  including HTN and Hyperlipidemia-Rx augmentin and also refer to cardiology. He has numerous causes for CP, he has had cold sxs, he has had a hx of xiphoid pain, he has  what sounds like GERD sxs. He does have RF for CAD including Hyperlipidemia and HTN . EKG was NSR, gave precautions to go to ER prn.  Will refer to ortho for hand paresthesia that has been chronic,he has a hx of carpal tunnel syndrome His meds may cause him to have a chronic dry cough on top of his URI sxs, if this doe snot resolve afte abx and cont to have dry hacking cough then neeed to consider changing HTN meds to ARB or CCB or HCTZ He has joint pain but I am not sure if cholesterol meds, will d/w patient once I get labs back Labs and Referrals pending   Refer to hand ortho and also cardiology for cacardiology evaluation. Fu in 6 months  Gross sideeffects, risk and benefits, and alternatives of medications d/w patient. Patient is aware that all medications have potential sideeffects and we are unable to predict every sideeffect or drug-drug interaction that may occur.  Hamilton Capri PHUONG, DO 09/20/2014 8:46 PM

## 2014-09-21 LAB — COMPREHENSIVE METABOLIC PANEL WITH GFR
Albumin: 4.6 g/dL (ref 3.5–5.2)
BUN: 20 mg/dL (ref 6–23)
CO2: 28 meq/L (ref 19–32)
Calcium: 9.7 mg/dL (ref 8.4–10.5)
Chloride: 103 meq/L (ref 96–112)
Creat: 1.28 mg/dL (ref 0.50–1.35)
Glucose, Bld: 86 mg/dL (ref 70–99)
Potassium: 4.5 meq/L (ref 3.5–5.3)

## 2014-09-21 LAB — TSH: TSH: 1.544 u[IU]/mL (ref 0.350–4.500)

## 2014-09-21 LAB — LIPID PANEL
Cholesterol: 205 mg/dL — ABNORMAL HIGH (ref 0–200)
HDL: 33 mg/dL — ABNORMAL LOW (ref >39)
LDL Cholesterol: 136 mg/dL — ABNORMAL HIGH (ref 0–99)
Total CHOL/HDL Ratio: 6.2 ratio
Triglycerides: 181 mg/dL — ABNORMAL HIGH (ref <150)
VLDL: 36 mg/dL (ref 0–40)

## 2014-09-21 LAB — COMPREHENSIVE METABOLIC PANEL
ALT: 24 U/L (ref 0–53)
AST: 26 U/L (ref 0–37)
Alkaline Phosphatase: 59 U/L (ref 39–117)
Sodium: 141 mEq/L (ref 135–145)
Total Bilirubin: 0.3 mg/dL (ref 0.2–1.2)
Total Protein: 7.6 g/dL (ref 6.0–8.3)

## 2014-09-22 ENCOUNTER — Telehealth: Payer: Self-pay | Admitting: Family Medicine

## 2014-09-22 ENCOUNTER — Encounter: Payer: Self-pay | Admitting: Family Medicine

## 2014-09-22 MED ORDER — ATORVASTATIN CALCIUM 10 MG PO TABS: 10.0000 mg | ORAL_TABLET | Freq: Every day | ORAL | Status: DC

## 2014-09-22 NOTE — Telephone Encounter (Signed)
Discussed labs with patient, CP is resolved. Will try low dose statin to see if tolerate since has joint pain already if worse then will need to do diet and exercise. 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

## 2014-09-30 ENCOUNTER — Encounter: Payer: Self-pay | Admitting: Family Medicine

## 2014-10-27 ENCOUNTER — Encounter (HOSPITAL_BASED_OUTPATIENT_CLINIC_OR_DEPARTMENT_OTHER): Payer: Self-pay | Admitting: *Deleted

## 2014-10-27 ENCOUNTER — Other Ambulatory Visit: Payer: Self-pay | Admitting: Orthopedic Surgery

## 2014-10-28 ENCOUNTER — Encounter (HOSPITAL_BASED_OUTPATIENT_CLINIC_OR_DEPARTMENT_OTHER)
Admission: RE | Disposition: A | Discharge status: Home / Self Care | Payer: Self-pay | Source: Ambulatory Visit | Attending: Orthopedic Surgery

## 2014-10-28 ENCOUNTER — Ambulatory Visit (HOSPITAL_BASED_OUTPATIENT_CLINIC_OR_DEPARTMENT_OTHER): Payer: BC Managed Care – PPO | Admitting: Anesthesiology

## 2014-10-28 ENCOUNTER — Encounter (HOSPITAL_BASED_OUTPATIENT_CLINIC_OR_DEPARTMENT_OTHER): Payer: Self-pay | Admitting: *Deleted

## 2014-10-28 ENCOUNTER — Ambulatory Visit (HOSPITAL_BASED_OUTPATIENT_CLINIC_OR_DEPARTMENT_OTHER)
Admission: RE | Admit: 2014-10-28 | Discharge: 2014-10-28 | Disposition: A | Discharge status: Home / Self Care | Payer: BC Managed Care – PPO | Source: Ambulatory Visit | Attending: Orthopedic Surgery | Admitting: Orthopedic Surgery

## 2014-10-28 DIAGNOSIS — Z881 Allergy status to other antibiotic agents status: Secondary | ICD-10-CM | POA: Insufficient documentation

## 2014-10-28 DIAGNOSIS — G5602 Carpal tunnel syndrome, left upper limb: Secondary | ICD-10-CM | POA: Diagnosis present

## 2014-10-28 DIAGNOSIS — E780 Pure hypercholesterolemia: Secondary | ICD-10-CM | POA: Insufficient documentation

## 2014-10-28 DIAGNOSIS — G4730 Sleep apnea, unspecified: Secondary | ICD-10-CM | POA: Diagnosis not present

## 2014-10-28 DIAGNOSIS — I10 Essential (primary) hypertension: Secondary | ICD-10-CM | POA: Insufficient documentation

## 2014-10-28 DIAGNOSIS — M67432 Ganglion, left wrist: Secondary | ICD-10-CM | POA: Diagnosis not present

## 2014-10-28 HISTORY — PX: CARPAL TUNNEL RELEASE: SHX101

## 2014-10-28 HISTORY — PX: MASS EXCISION: SHX2000

## 2014-10-28 HISTORY — DX: Sleep apnea, unspecified: G47.30

## 2014-10-28 LAB — POCT I-STAT, CHEM 8
BUN: 17 mg/dL (ref 6–23)
Calcium, Ion: 1.16 mmol/L (ref 1.12–1.23)
Chloride: 103 mEq/L (ref 96–112)
Creatinine, Ser: 1 mg/dL (ref 0.50–1.35)
Glucose, Bld: 112 mg/dL — ABNORMAL HIGH (ref 70–99)
HCT: 49 % (ref 39.0–52.0)
Hemoglobin: 16.7 g/dL (ref 13.0–17.0)
Potassium: 4.4 mEq/L (ref 3.7–5.3)
Sodium: 139 mEq/L (ref 137–147)
TCO2: 26 mmol/L (ref 0–100)

## 2014-10-28 SURGERY — EXCISION MASS
Anesthesia: General | Site: Wrist | Laterality: Left

## 2014-10-28 MED ORDER — PROPOFOL 10 MG/ML IV BOLUS
INTRAVENOUS | Status: DC | PRN
  Administered 2014-10-28: 200 mg via INTRAVENOUS

## 2014-10-28 MED ORDER — FENTANYL CITRATE 0.05 MG/ML IJ SOLN
INTRAMUSCULAR | Status: AC
  Filled 2014-10-28: qty 6

## 2014-10-28 MED ORDER — LIDOCAINE HCL (CARDIAC) 20 MG/ML IV SOLN
INTRAVENOUS | Status: DC | PRN
  Administered 2014-10-28: 100 mg via INTRAVENOUS

## 2014-10-28 MED ORDER — LACTATED RINGERS IV SOLN
INTRAVENOUS | Status: DC
  Administered 2014-10-28 (×2): via INTRAVENOUS

## 2014-10-28 MED ORDER — CHLORHEXIDINE GLUCONATE 4 % EX LIQD: 60.0000 mL | Freq: Once | CUTANEOUS | Status: DC

## 2014-10-28 MED ORDER — DEXAMETHASONE SODIUM PHOSPHATE 4 MG/ML IJ SOLN
INTRAMUSCULAR | Status: DC | PRN
  Administered 2014-10-28: 10 mg via INTRAVENOUS

## 2014-10-28 MED ORDER — OXYCODONE HCL 5 MG PO TABS
ORAL_TABLET | ORAL | Status: AC
  Filled 2014-10-28: qty 1

## 2014-10-28 MED ORDER — ONDANSETRON HCL 4 MG/2ML IJ SOLN
INTRAMUSCULAR | Status: DC | PRN
Start: 2014-10-28 — End: 2014-10-28
  Administered 2014-10-28: 4 mg via INTRAVENOUS

## 2014-10-28 MED ORDER — CEFAZOLIN SODIUM-DEXTROSE 2-3 GM-% IV SOLR
2.0000 g | INTRAVENOUS | Status: AC
  Administered 2014-10-28: 2 g via INTRAVENOUS

## 2014-10-28 MED ORDER — MIDAZOLAM HCL 5 MG/5ML IJ SOLN
INTRAMUSCULAR | Status: DC | PRN
  Administered 2014-10-28: 2 mg via INTRAVENOUS

## 2014-10-28 MED ORDER — HYDROMORPHONE HCL 1 MG/ML IJ SOLN
0.2500 mg | INTRAMUSCULAR | Status: DC | PRN
  Administered 2014-10-28: 0.5 mg via INTRAVENOUS

## 2014-10-28 MED ORDER — MIDAZOLAM HCL 2 MG/2ML IJ SOLN
INTRAMUSCULAR | Status: AC
  Filled 2014-10-28: qty 2

## 2014-10-28 MED ORDER — OXYCODONE HCL 5 MG PO TABS
5.0000 mg | ORAL_TABLET | Freq: Once | ORAL | Status: AC | PRN
  Administered 2014-10-28: 5 mg via ORAL

## 2014-10-28 MED ORDER — CEFAZOLIN SODIUM-DEXTROSE 2-3 GM-% IV SOLR
INTRAVENOUS | Status: AC
  Filled 2014-10-28: qty 50

## 2014-10-28 MED ORDER — ONDANSETRON HCL 4 MG/2ML IJ SOLN: 4.0000 mg | Freq: Once | INTRAMUSCULAR | Status: DC | PRN

## 2014-10-28 MED ORDER — HYDROMORPHONE HCL 1 MG/ML IJ SOLN
INTRAMUSCULAR | Status: AC
  Filled 2014-10-28: qty 1

## 2014-10-28 MED ORDER — FENTANYL CITRATE 0.05 MG/ML IJ SOLN
INTRAMUSCULAR | Status: DC | PRN
  Administered 2014-10-28: 100 ug via INTRAVENOUS

## 2014-10-28 MED ORDER — BUPIVACAINE HCL (PF) 0.25 % IJ SOLN
INTRAMUSCULAR | Status: DC | PRN
  Administered 2014-10-28: 10 mL

## 2014-10-28 MED ORDER — OXYCODONE HCL 5 MG/5ML PO SOLN: 5.0000 mg | Freq: Once | ORAL | Status: AC | PRN

## 2014-10-28 MED ORDER — OXYCODONE-ACETAMINOPHEN 5-325 MG PO TABS: 1.0000 | ORAL_TABLET | ORAL | Status: DC | PRN

## 2014-10-28 SURGICAL SUPPLY — 49 items
APL SKNCLS STERI-STRIP NONHPOA (GAUZE/BANDAGES/DRESSINGS) ×2
BAG DECANTER FOR FLEXI CONT (MISCELLANEOUS) IMPLANT
BANDAGE ELASTIC 3 VELCRO ST LF (GAUZE/BANDAGES/DRESSINGS) ×3 IMPLANT
BANDAGE ELASTIC 4 VELCRO ST LF (GAUZE/BANDAGES/DRESSINGS) ×3 IMPLANT
BENZOIN TINCTURE PRP APPL 2/3 (GAUZE/BANDAGES/DRESSINGS) ×3 IMPLANT
BLADE SURG 15 STRL LF DISP TIS (BLADE) ×2 IMPLANT
BLADE SURG 15 STRL SS (BLADE) ×3
BNDG CMPR 9X4 STRL LF SNTH (GAUZE/BANDAGES/DRESSINGS) ×2
BNDG COHESIVE 1X5 TAN STRL LF (GAUZE/BANDAGES/DRESSINGS) IMPLANT
BNDG ESMARK 4X9 LF (GAUZE/BANDAGES/DRESSINGS) ×3 IMPLANT
BNDG GAUZE ELAST 4 BULKY (GAUZE/BANDAGES/DRESSINGS) ×3 IMPLANT
CORDS BIPOLAR (ELECTRODE) ×3 IMPLANT
COVER BACK TABLE 60X90IN (DRAPES) ×3 IMPLANT
CUFF TOURNIQUET SINGLE 18IN (TOURNIQUET CUFF) ×3 IMPLANT
DECANTER SPIKE VIAL GLASS SM (MISCELLANEOUS) IMPLANT
DRAPE EXTREMITY T 121X128X90 (DRAPE) ×3 IMPLANT
DRAPE SURG 17X23 STRL (DRAPES) ×3 IMPLANT
DURAPREP 26ML APPLICATOR (WOUND CARE) ×3 IMPLANT
GAUZE SPONGE 4X4 12PLY STRL (GAUZE/BANDAGES/DRESSINGS) ×3 IMPLANT
GAUZE XEROFORM 1X8 LF (GAUZE/BANDAGES/DRESSINGS) IMPLANT
GLOVE BIOGEL PI IND STRL 7.0 (GLOVE) ×2 IMPLANT
GLOVE BIOGEL PI INDICATOR 7.0 (GLOVE) ×1
GLOVE ECLIPSE 6.5 STRL STRAW (GLOVE) ×3 IMPLANT
GLOVE SURG SYN 8.0 (GLOVE) ×6 IMPLANT
GOWN STRL REUS W/ TWL LRG LVL3 (GOWN DISPOSABLE) ×2 IMPLANT
GOWN STRL REUS W/TWL LRG LVL3 (GOWN DISPOSABLE) ×3
GOWN STRL REUS W/TWL XL LVL3 (GOWN DISPOSABLE) ×3 IMPLANT
NEEDLE HYPO 25X1 1.5 SAFETY (NEEDLE) ×3 IMPLANT
NS IRRIG 1000ML POUR BTL (IV SOLUTION) ×3 IMPLANT
PACK BASIN DAY SURGERY FS (CUSTOM PROCEDURE TRAY) ×3 IMPLANT
PAD CAST 3X4 CTTN HI CHSV (CAST SUPPLIES) ×2 IMPLANT
PADDING CAST ABS 4INX4YD NS (CAST SUPPLIES) ×1
PADDING CAST ABS COTTON 4X4 ST (CAST SUPPLIES) ×2 IMPLANT
PADDING CAST COTTON 3X4 STRL (CAST SUPPLIES) ×3
SHEET MEDIUM DRAPE 40X70 STRL (DRAPES) ×3 IMPLANT
SPLINT PLASTER CAST XFAST 4X15 (CAST SUPPLIES) ×30 IMPLANT
SPLINT PLASTER XTRA FAST SET 4 (CAST SUPPLIES) ×15
STOCKINETTE 4X48 STRL (DRAPES) ×3 IMPLANT
STRIP CLOSURE SKIN 1/2X4 (GAUZE/BANDAGES/DRESSINGS) ×3 IMPLANT
SUT ETHILON 4 0 PS 2 18 (SUTURE) IMPLANT
SUT PROLENE 3 0 PS 2 (SUTURE) ×3 IMPLANT
SUT VIC AB 4-0 P-3 18XBRD (SUTURE) IMPLANT
SUT VIC AB 4-0 P3 18 (SUTURE)
SUT VICRYL RAPIDE 4-0 (SUTURE) IMPLANT
SUT VICRYL RAPIDE 4/0 PS 2 (SUTURE) IMPLANT
SYR BULB 3OZ (MISCELLANEOUS) ×3 IMPLANT
SYRINGE 10CC LL (SYRINGE) ×3 IMPLANT
TOWEL OR 17X24 6PK STRL BLUE (TOWEL DISPOSABLE) ×3 IMPLANT
UNDERPAD 30X30 INCONTINENT (UNDERPADS AND DIAPERS) ×3 IMPLANT

## 2014-10-28 NOTE — Anesthesia Preprocedure Evaluation (Signed)
Anesthesia Evaluation  Patient identified by MRN, date of birth, ID band Patient awake    Reviewed: Allergy & Precautions, H&P , NPO status , Patient's Chart, lab work & pertinent test results  Airway Mallampati: I TM Distance: >3 FB Neck ROM: Full    Dental  (+) Teeth Intact, Dental Advisory Given   Pulmonary  breath sounds clear to auscultation        Cardiovascular hypertension, Pt. on medications Rhythm:Regular Rate:Normal     Neuro/Psych    GI/Hepatic   Endo/Other  Morbid obesity  Renal/GU      Musculoskeletal   Abdominal   Peds  Hematology   Anesthesia Other Findings   Reproductive/Obstetrics                          Anesthesia Physical Anesthesia Plan  ASA: III  Anesthesia Plan: General   Post-op Pain Management:    Induction: Intravenous  Airway Management Planned: LMA  Additional Equipment:   Intra-op Plan:   Post-operative Plan: Extubation in OR  Informed Consent: I have reviewed the patients History and Physical, chart, labs and discussed the procedure including the risks, benefits and alternatives for the proposed anesthesia with the patient or authorized representative who has indicated his/her understanding and acceptance.   Dental advisory given  Plan Discussed with: CRNA, Anesthesiologist and Surgeon  Anesthesia Plan Comments:         Anesthesia Quick Evaluation  

## 2014-10-28 NOTE — H&P (Signed)
Walter Malessaiah Saha Jr. is an 43 y.o. male.   Chief Complaint: left CTS and volar mass HPI: as above with positive NCV and painful volar mass  Past Medical History  Diagnosis Date  . Hernia     bi lateral hernia repair  . Hypertension   . Elevated cholesterol   . Allergy   . Sleep apnea 2014    severe    Past Surgical History  Procedure Laterality Date  . Inguinal hernia repair      times two, each side  . Kidney stone surgery    . Carpal tunnel release Right 2007    Family History  Problem Relation Age of Onset  . Diabetes Mother   . Stroke Mother     4 strokes  . Hypertension Father   . Hypertension Sister   . Heart disease Sister     pacemaker   Social History:  reports that he has never smoked. He does not have any smokeless tobacco history on file. He reports that he does not drink alcohol or use illicit drugs.  Allergies:  Allergies  Allergen Reactions  . Erythromycin Base     Medications Prior to Admission  Medication Sig Dispense Refill  . atorvastatin (LIPITOR) 10 MG tablet Take 1 tablet (10 mg total) by mouth daily. 30 tablet 3  . fenofibrate (TRICOR) 145 MG tablet TAKE 1 TABLET (145 MG TOTAL) BY MOUTH DAILY. 30 tablet 4  . lisinopril (PRINIVIL,ZESTRIL) 20 MG tablet TAKE 1 TABLET (20 MG TOTAL) BY MOUTH DAILY. 30 tablet 5  . amoxicillin-clavulanate (AUGMENTIN) 875-125 MG per tablet Take 1 tablet by mouth 2 (two) times daily. 20 tablet 0    Results for orders placed or performed during the hospital encounter of 10/28/14 (from the past 48 hour(s))  I-STAT, chem 8     Status: Abnormal   Collection Time: 10/28/14  9:22 AM  Result Value Ref Range   Sodium 139 137 - 147 mEq/L   Potassium 4.4 3.7 - 5.3 mEq/L   Chloride 103 96 - 112 mEq/L   BUN 17 6 - 23 mg/dL   Creatinine, Ser 4.091.00 0.50 - 1.35 mg/dL   Glucose, Bld 811112 (H) 70 - 99 mg/dL   Calcium, Ion 9.141.16 7.821.12 - 1.23 mmol/L   TCO2 26 0 - 100 mmol/L   Hemoglobin 16.7 13.0 - 17.0 g/dL   HCT 95.649.0 21.339.0 - 08.652.0 %    No results found.  Review of Systems  All other systems reviewed and are negative.   Blood pressure 135/77, pulse 79, temperature 98.3 F (36.8 C), temperature source Oral, resp. rate 18, height 5\' 7"  (1.702 m), weight 109.43 kg (241 lb 4 oz), SpO2 98 %. Physical Exam  Constitutional: He is oriented to person, place, and time. He appears well-developed and well-nourished.  HENT:  Head: Normocephalic and atraumatic.  Cardiovascular: Normal rate.   Respiratory: Effort normal.  Musculoskeletal:       Left wrist: He exhibits tenderness.  Left wrist volar mass and CTS with positive NCV  Neurological: He is alert and oriented to person, place, and time.  Skin: Skin is warm.  Psychiatric: He has a normal mood and affect. His behavior is normal. Judgment and thought content normal.     Assessment/Plan As above   Plan mass excision and CTR  Rehan Holness A 10/28/2014, 10:29 AM

## 2014-10-28 NOTE — Transfer of Care (Signed)
Immediate Anesthesia Transfer of Care Note  Patient: Walter Malessaiah Stenner Jr.  Procedure(s) Performed: Procedure(s): LEFT WRIST VOLAR MASS EXCISION (Left) LEFT CARPAL TUNNEL RELEASE (Left)  Patient Location: PACU  Anesthesia Type:General  Level of Consciousness: awake, alert  and oriented  Airway & Oxygen Therapy: Patient Spontanous Breathing and Patient connected to face mask oxygen  Post-op Assessment: Report given to PACU RN, Post -op Vital signs reviewed and stable and Patient moving all extremities  Post vital signs: Reviewed and stable  Complications: No apparent anesthesia complications

## 2014-10-28 NOTE — Anesthesia Procedure Notes (Signed)
Procedure Name: LMA Insertion Date/Time: 10/28/2014 10:46 AM Performed by: Zenia ResidesPAYNE, Lexus Barletta D Pre-anesthesia Checklist: Patient identified, Emergency Drugs available, Suction available and Patient being monitored Patient Re-evaluated:Patient Re-evaluated prior to inductionOxygen Delivery Method: Circle System Utilized Preoxygenation: Pre-oxygenation with 100% oxygen Intubation Type: IV induction Ventilation: Mask ventilation without difficulty LMA: LMA inserted LMA Size: 5.0 Number of attempts: 1 Airway Equipment and Method: bite block Placement Confirmation: positive ETCO2 Tube secured with: Tape Dental Injury: Teeth and Oropharynx as per pre-operative assessment

## 2014-10-28 NOTE — Op Note (Signed)
See note [phone]

## 2014-10-28 NOTE — Discharge Instructions (Signed)

## 2014-10-28 NOTE — Anesthesia Postprocedure Evaluation (Signed)
  Anesthesia Post-op Note  Patient: Walter Malessaiah Farnell Jr.  Procedure(s) Performed: Procedure(s): LEFT WRIST VOLAR MASS EXCISION (Left) LEFT CARPAL TUNNEL RELEASE (Left)  Patient Location: PACU  Anesthesia Type: General   Level of Consciousness: awake, alert  and oriented  Airway and Oxygen Therapy: Patient Spontanous Breathing  Post-op Pain: mild  Post-op Assessment: Post-op Vital signs reviewed  Post-op Vital Signs: Reviewed  Last Vitals:  Filed Vitals:   10/28/14 1300  BP: 127/82  Pulse: 82  Temp: 36.6 C  Resp: 16    Complications: No apparent anesthesia complications

## 2014-10-31 ENCOUNTER — Encounter (HOSPITAL_BASED_OUTPATIENT_CLINIC_OR_DEPARTMENT_OTHER): Payer: Self-pay | Admitting: Orthopedic Surgery

## 2014-10-31 NOTE — Op Note (Signed)
Walter Reynolds:  Walter Reynolds, Walter Reynolds              ACCOUNT NO.:  [id number]  MEDICAL RECORD NO.:  [id number]  LOCATION:                               FACILITY:  [facility]  PHYSICIAN:  Artist PaisMatthew A. Paydon Carll, M.D.DATE OF BIRTH:  Apr 23, 1971  DATE OF PROCEDURE:  10/28/2014 DATE OF DISCHARGE:  10/28/2014                              OPERATIVE REPORT   PREOPERATIVE DIAGNOSES:  Chronic left carpal tunnel syndrome and left wrist volar mass.  POSTOPERATIVE DIAGNOSES:  Chronic left carpal tunnel syndrome and left wrist volar mass.  PROCEDURE:  Excisional biopsy deep mass distal forearm and wrist area and left carpal tunnel release separate incisions.  SURGEON:  Artist PaisMatthew A. Mina MarbleWeingold, M.D.  ASSISTANT:  None.  ANESTHESIA:  General.  No complications.  No drains.  One specimen sent.  DESCRIPTION OF PROCEDURE:  The patient was taken to the operating suite. After induction of adequate general anesthetic, left upper extremity was prepped and draped in sterile fashion.  An Esmarch was used to exsanguinate the limb.  Tourniquet was inflated to 265 mmHg.  At this point in time, incision was made in the palmar aspect of the left hand in line with long metacarpals starting at Kaplan's cardinal line.  Skin was incised.  Palmar fascia was identified and split.  Distal edge of the transverse carpal ligament was identified with a 15 blade.  Median nerve was identified and protected with a Therapist, nutritionalreer elevator.  Remaining aspects of the transverse carpal ligament were then divided under direct vision using curved blunt scissors.  Canal was inspected.  There were no osseous lesions present, irrigated and loosely closed with 3-0 Prolene subcuticular stitch.  Second incision was made over the FCR and radial artery.  Area longitudinally for 2 to 3 cm.  Skin was incised.  Interval between the FCR and radial artery was identified.  A cystic lesion was seen sitting on top of the artery, we carefully dissected this down to a stalk,  there was some contact in the stalk on the artery itself, we carefully dissected this free under loupe magnification.  We then excised the cyst and cauterized the stalk with bipolar cautery.  The wound was irrigated and loosely closed with 3-0 Prolene subcuticular stitch.  Steri-Strips, 4x4s, fluffs, and again a compressive dressing and volar splint was applied.  The patient tolerated both procedures well, went to the recovery room in a stable fashion.     Artist PaisMatthew A. Mina MarbleWeingold, M.D.     MAW/MEDQ  D:  10/28/2014  T:  10/28/2014  Job:  [id number]

## 2014-12-03 ENCOUNTER — Ambulatory Visit (INDEPENDENT_AMBULATORY_CARE_PROVIDER_SITE_OTHER): Payer: BC Managed Care – PPO | Admitting: Family Medicine

## 2014-12-03 VITALS — BP 138/92 | HR 87 | Temp 97.7°F | Resp 18 | Ht 67.0 in | Wt 244.0 lb

## 2014-12-03 DIAGNOSIS — M109 Gout, unspecified: Secondary | ICD-10-CM

## 2014-12-03 DIAGNOSIS — E785 Hyperlipidemia, unspecified: Secondary | ICD-10-CM

## 2014-12-03 DIAGNOSIS — J309 Allergic rhinitis, unspecified: Secondary | ICD-10-CM

## 2014-12-03 DIAGNOSIS — M10072 Idiopathic gout, left ankle and foot: Secondary | ICD-10-CM

## 2014-12-03 DIAGNOSIS — G4733 Obstructive sleep apnea (adult) (pediatric): Secondary | ICD-10-CM

## 2014-12-03 MED ORDER — MOMETASONE FUROATE 50 MCG/ACT NA SUSP: 2.0000 | Freq: Every morning | NASAL | Status: DC

## 2014-12-03 MED ORDER — ROSUVASTATIN CALCIUM 10 MG PO TABS: 10.0000 mg | ORAL_TABLET | Freq: Every day | ORAL | Status: DC

## 2014-12-03 MED ORDER — HYDROCODONE-ACETAMINOPHEN 5-325 MG PO TABS: 1.0000 | ORAL_TABLET | Freq: Four times a day (QID) | ORAL | Status: DC | PRN

## 2014-12-03 MED ORDER — PREDNISONE 20 MG PO TABS: 40.0000 mg | ORAL_TABLET | Freq: Every day | ORAL | Status: DC

## 2014-12-03 NOTE — Patient Instructions (Signed)
Cholesterol Cholesterol is a white, waxy, fat-like substance needed by your body in small amounts. The liver makes all the cholesterol you need. Cholesterol is carried from the liver by the blood through the blood vessels. Deposits of cholesterol (plaque) may build up on blood vessel walls. These make the arteries narrower and stiffer. Cholesterol plaques increase the risk for heart attack and stroke.  You cannot feel your cholesterol level even if it is very high. The only way to know it is high is with a blood test. Once you know your cholesterol levels, you should keep a record of the test results. Work with your health care provider to keep your levels in the desired range.  WHAT DO THE RESULTS MEAN?  Total cholesterol is a rough measure of all the cholesterol in your blood.   LDL is the so-called bad cholesterol. This is the type that deposits cholesterol in the walls of the arteries. You want this level to be low.   HDL is the good cholesterol because it cleans the arteries and carries the LDL away. You want this level to be high.  Triglycerides are fat that the body can either burn for energy or store. High levels are closely linked to heart disease.  WHAT ARE THE DESIRED LEVELS OF CHOLESTEROL?  Total cholesterol below 200.   LDL below 100 for people at risk, below 70 for those at very high risk.   HDL above 50 is good, above 60 is best.   Triglycerides below 150.  HOW CAN I LOWER MY CHOLESTEROL?  Diet. Follow your diet programs as directed by your health care provider.   Choose fish or white meat chicken and [place], roasted or baked. Limit fatty cuts of red meat, fried foods, and processed meats, such as sausage and lunch meats.   Eat lots of fresh fruits and vegetables.  Choose whole grains, beans, pasta, potatoes, and cereals.   Use only small amounts of olive, corn, or canola oils.   Avoid butter, mayonnaise, shortening, or palm kernel oils.  Avoid foods with  trans fats.   Drink skim or nonfat milk and eat low-fat or nonfat yogurt and cheeses. Avoid whole milk, cream, ice cream, egg yolks, and full-fat cheeses.   Healthy desserts include angel food cake, ginger snaps, animal crackers, hard candy, popsicles, and low-fat or nonfat frozen yogurt. Avoid pastries, cakes, pies, and cookies.   Exercise. Follow your exercise programs as directed by your health care provider.   A regular program helps decrease LDL and raise HDL.   A regular program helps with weight control.   Do things that increase your activity level like gardening, walking, or taking the stairs. Ask your health care provider about how you can be more active in your daily life.   Medicine. Take medicine only as directed by your health care provider.   Medicine may be prescribed by your health care provider to help lower cholesterol and decrease the risk for heart disease.   If you have several risk factors, you may need medicine even if your levels are normal. Document Released: 08/27/2001 Document Revised: 04/18/2014 Document Reviewed: 09/15/2013 [facility] Patient Information 2015 [place], [place]. This information is not intended to replace advice given to you by your health care provider. Make sure you discuss any questions you have with your health care provider. Allergic Rhinitis Allergic rhinitis is when the mucous membranes in the nose respond to allergens. Allergens are particles in the air that cause your body to have an  allergic reaction. This causes you to release allergic antibodies. Through a chain of events, these eventually cause you to release histamine into the blood stream. Although meant to protect the body, it is this release of histamine that causes your discomfort, such as frequent sneezing, congestion, and an itchy, runny nose.  CAUSES  Seasonal allergic rhinitis (hay fever) is caused by pollen allergens that may come from grasses, trees, and weeds.  Year-round allergic rhinitis (perennial allergic rhinitis) is caused by allergens such as house dust mites, pet dander, and mold spores.  SYMPTOMS   Nasal stuffiness (congestion).  Itchy, runny nose with sneezing and tearing of the eyes. DIAGNOSIS  Your health care provider can help you determine the allergen or allergens that trigger your symptoms. If you and your health care provider are unable to determine the allergen, skin or blood testing may be used. TREATMENT  Allergic rhinitis does not have a cure, but it can be controlled by:  Medicines and allergy shots (immunotherapy).  Avoiding the allergen. Hay fever may often be treated with antihistamines in pill or nasal spray forms. Antihistamines block the effects of histamine. There are over-the-counter medicines that may help with nasal congestion and swelling around the eyes. Check with your health care provider before taking or giving this medicine.  If avoiding the allergen or the medicine prescribed do not work, there are many new medicines your health care provider can prescribe. Stronger medicine may be used if initial measures are ineffective. Desensitizing injections can be used if medicine and avoidance does not work. Desensitization is when a patient is given ongoing shots until the body becomes less sensitive to the allergen. Make sure you follow up with your health care provider if problems continue. HOME CARE INSTRUCTIONS It is not possible to completely avoid allergens, but you can reduce your symptoms by taking steps to limit your exposure to them. It helps to know exactly what you are allergic to so that you can avoid your specific triggers. SEEK MEDICAL CARE IF:   You have a fever.  You develop a cough that does not stop easily (persistent).  You have shortness of breath.  You start wheezing.  Symptoms interfere with normal daily activities. Document Released: 08/27/2001 Document Revised: 12/07/2013 Document  Reviewed: 08/09/2013 [facility] Patient Information 2015 [place], [place]. This information is not intended to replace advice given to you by your health care provider. Make sure you discuss any questions you have with your health care provider. Gout Gout is an inflammatory arthritis caused by a buildup of uric acid crystals in the joints. Uric acid is a chemical that is normally present in the blood. When the level of uric acid in the blood is too high it can form crystals that deposit in your joints and tissues. This causes joint redness, soreness, and swelling (inflammation). Repeat attacks are common. Over time, uric acid crystals can form into masses (tophi) near a joint, destroying bone and causing disfigurement. Gout is treatable and often preventable. CAUSES  The disease begins with elevated levels of uric acid in the blood. Uric acid is produced by your body when it breaks down a naturally found substance called purines. Certain foods you eat, such as meats and fish, contain high amounts of purines. Causes of an elevated uric acid level include:  Being passed down from parent to child (heredity).  Diseases that cause increased uric acid production (such as obesity, psoriasis, and certain cancers).  Excessive alcohol use.  Diet, especially diets rich in meat  and seafood.  Medicines, including certain cancer-fighting medicines (chemotherapy), water pills (diuretics), and aspirin.  Chronic kidney disease. The kidneys are no longer able to remove uric acid well.  Problems with metabolism. Conditions strongly associated with gout include:  Obesity.  High blood pressure.  High cholesterol.  Diabetes. Not everyone with elevated uric acid levels gets gout. It is not understood why some people get gout and others do not. Surgery, joint injury, and eating too much of certain foods are some of the factors that can lead to gout attacks. SYMPTOMS   An attack of gout comes on quickly. It  causes intense pain with redness, swelling, and warmth in a joint.  Fever can occur.  Often, only one joint is involved. Certain joints are more commonly involved:  Base of the big toe.  Knee.  Ankle.  Wrist.  Finger. Without treatment, an attack usually goes away in a few days to weeks. Between attacks, you usually will not have symptoms, which is different from many other forms of arthritis. DIAGNOSIS  Your caregiver will suspect gout based on your symptoms and exam. In some cases, tests may be recommended. The tests may include:  Blood tests.  Urine tests.  X-rays.  Joint fluid exam. This exam requires a needle to remove fluid from the joint (arthrocentesis). Using a microscope, gout is confirmed when uric acid crystals are seen in the joint fluid. TREATMENT  There are two phases to gout treatment: treating the sudden onset (acute) attack and preventing attacks (prophylaxis).  Treatment of an Acute Attack.  Medicines are used. These include anti-inflammatory medicines or steroid medicines.  An injection of steroid medicine into the affected joint is sometimes necessary.  The painful joint is rested. Movement can worsen the arthritis.  You may use warm or cold treatments on painful joints, depending which works best for you.  Treatment to Prevent Attacks.  If you suffer from frequent gout attacks, your caregiver may advise preventive medicine. These medicines are started after the acute attack subsides. These medicines either help your kidneys eliminate uric acid from your body or decrease your uric acid production. You may need to stay on these medicines for a very long time.  The early phase of treatment with preventive medicine can be associated with an increase in acute gout attacks. For this reason, during the first few months of treatment, your caregiver may also advise you to take medicines usually used for acute gout treatment. Be sure you understand your  caregiver's directions. Your caregiver may make several adjustments to your medicine dose before these medicines are effective.  Discuss dietary treatment with your caregiver or dietitian. Alcohol and drinks high in sugar and fructose and foods such as meat, poultry, and seafood can increase uric acid levels. Your caregiver or dietitian can advise you on drinks and foods that should be limited. HOME CARE INSTRUCTIONS   Do not take aspirin to relieve pain. This raises uric acid levels.  Only take over-the-counter or prescription medicines for pain, discomfort, or fever as directed by your caregiver.  Rest the joint as much as possible. When in bed, keep sheets and blankets off painful areas.  Keep the affected joint raised (elevated).  Apply warm or cold treatments to painful joints. Use of warm or cold treatments depends on which works best for you.  Use crutches if the painful joint is in your leg.  Drink enough fluids to keep your urine clear or pale yellow. This helps your body get rid of  uric acid. Limit alcohol, sugary drinks, and fructose drinks.  Follow your dietary instructions. Pay careful attention to the amount of protein you eat. Your daily diet should emphasize fruits, vegetables, whole grains, and fat-free or low-fat milk products. Discuss the use of coffee, vitamin C, and cherries with your caregiver or dietitian. These may be helpful in lowering uric acid levels.  Maintain a healthy body weight. SEEK MEDICAL CARE IF:   You develop diarrhea, vomiting, or any side effects from medicines.  You do not feel better in 24 hours, or you are getting worse. SEEK IMMEDIATE MEDICAL CARE IF:   Your joint becomes suddenly more tender, and you have chills or a fever. MAKE SURE YOU:   Understand these instructions.  Will watch your condition.  Will get help right away if you are not doing well or get worse. Document Released: 11/29/2000 Document Revised: 04/18/2014 Document  Reviewed: 07/15/2012 [facility] Patient Information 2015 [place], [place]. This information is not intended to replace advice given to you by your health care provider. Make sure you discuss any questions you have with your health care provider.

## 2014-12-03 NOTE — Progress Notes (Signed)
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 He comes in with acute pain in his right great toe over the last several days, although it's been hurting off and on a small amount 4 months.  Patient also has obstructive sleep apnea and has trouble with nasal congestion and snoring. He's unable to use the CPAP machine at times because his nose is so plugged up. He's tried Afrin and has been told not to use it chronically. He has an acute cough as well after being exposed to his symptoms cold.  Patient also was started on cholesterol medicine month ago and sees Lipitor) and has had diffuse muscle aches and joint pain since. He stopped the Lipitor couple days ago  Objective:  Spent 45 minutes face to face reviewing above problems  HEENT: Swollen nasal passages, otherwise negative Chest: Few expiratory wheezes Heart: Regular MN: Soft nontender Extremities show no edema but he does have swelling and hyperpigmentation at the right great toe MTP joint.     ICD-9-CM ICD-10-CM   1. Acute gout of left foot, unspecified cause 274.01 M10.072 predniSONE (DELTASONE) 20 MG tablet     HYDROcodone-acetaminophen (NORCO) 5-325 MG per tablet  2. Allergic rhinitis, unspecified allergic rhinitis type 477.9 J30.9 mometasone (NASONEX) 50 MCG/ACT nasal spray  3. OSA (obstructive sleep apnea) 327.23 G47.33   4. Hyperlipidemia 272.4 E78.5 rosuvastatin (CRESTOR) 10 MG tablet     Signed, Elvina SidleKurt Lailoni Baquera, MD

## 2015-01-28 ENCOUNTER — Ambulatory Visit (INDEPENDENT_AMBULATORY_CARE_PROVIDER_SITE_OTHER): Payer: BLUE CROSS/BLUE SHIELD | Admitting: Family Medicine

## 2015-01-28 ENCOUNTER — Ambulatory Visit (INDEPENDENT_AMBULATORY_CARE_PROVIDER_SITE_OTHER): Payer: BLUE CROSS/BLUE SHIELD

## 2015-01-28 ENCOUNTER — Other Ambulatory Visit: Payer: Self-pay | Admitting: Family Medicine

## 2015-01-28 VITALS — BP 122/74 | HR 88 | Temp 97.8°F | Resp 16 | Ht 66.5 in | Wt 245.2 lb

## 2015-01-28 DIAGNOSIS — M25511 Pain in right shoulder: Secondary | ICD-10-CM

## 2015-01-28 DIAGNOSIS — M7541 Impingement syndrome of right shoulder: Secondary | ICD-10-CM | POA: Diagnosis not present

## 2015-01-28 DIAGNOSIS — G4733 Obstructive sleep apnea (adult) (pediatric): Secondary | ICD-10-CM

## 2015-01-28 DIAGNOSIS — R0789 Other chest pain: Secondary | ICD-10-CM | POA: Diagnosis not present

## 2015-01-28 DIAGNOSIS — M940 Chondrocostal junction syndrome [Tietze]: Secondary | ICD-10-CM

## 2015-01-28 DIAGNOSIS — Z6838 Body mass index (BMI) 38.0-38.9, adult: Secondary | ICD-10-CM | POA: Diagnosis not present

## 2015-01-28 DIAGNOSIS — I10 Essential (primary) hypertension: Secondary | ICD-10-CM

## 2015-01-28 DIAGNOSIS — J309 Allergic rhinitis, unspecified: Secondary | ICD-10-CM

## 2015-01-28 LAB — POCT CBC
GRANULOCYTE PERCENT: 50.7 % (ref 37–80)
HCT, POC: 46.6 % (ref 43.5–53.7)
HEMOGLOBIN: 14.4 g/dL (ref 14.1–18.1)
Lymph, poc: 3.7 — AB (ref 0.6–3.4)
MCH: 23.8 pg — AB (ref 27–31.2)
MCHC: 30.9 g/dL — AB (ref 31.8–35.4)
MCV: 77 fL — AB (ref 80–97)
MID (cbc): 0.7 (ref 0–0.9)
MPV: 7.4 fL (ref 0–99.8)
POC GRANULOCYTE: 4.5 (ref 2–6.9)
POC LYMPH %: 41.5 % (ref 10–50)
POC MID %: 7.8 %M (ref 0–12)
Platelet Count, POC: 296 10*3/uL (ref 142–424)
RBC: 6.05 M/uL (ref 4.69–6.13)
RDW, POC: 15.3 %
WBC: 8.8 10*3/uL (ref 4.6–10.2)

## 2015-01-28 MED ORDER — MELOXICAM 15 MG PO TABS: 15.0000 mg | ORAL_TABLET | Freq: Every day | ORAL | Status: DC

## 2015-01-28 MED ORDER — CETIRIZINE HCL 10 MG PO TABS: 10.0000 mg | ORAL_TABLET | Freq: Every day | ORAL | Status: DC

## 2015-01-28 MED ORDER — TRIAMCINOLONE ACETONIDE 55 MCG/ACT NA AERO: 2.0000 | INHALATION_SPRAY | Freq: Every day | NASAL | Status: DC

## 2015-01-28 NOTE — Progress Notes (Signed)
Subjective:  This chart was scribed for Walter SorensonEva Melanye Hiraldo, MD, by Lionel DecemberHatice Demirci, ED Scribe. This patient was seen in room 11 and the patient's care was started at 2:34 PM.    Patient ID: Walter MalesIsaiah Starzyk Jr., male    DOB: Apr 14, 1971, 44 y.o.   MRN: [id number]   Chief Complaint  Patient presents with  . Chest Pain    x 3 days    HPI  HPI Comments: Walter Malessaiah Vallie Jr. is a 44 y.o. male who presents to [facility] for constant waxing and waning chest pains onset several days ago.  Patient has associated symptoms of chills lately which is atypical for him.  States he has not been been using his CPAP machine but notes of a burning sensation when he uses it, and states that his Nasonex is not helping clear up his nose. He also states he has a lot of mucous in his throat and his mouth has been very dry.  Patient is having pain in his deltoid area radiating into his shoulder and feels a pull when he lifts his arm.  He has not tried any over the counter medication or used any topical creams.   He is currently not using any over the counter medication.   Denies shortness of breath, cough, heartburn, sweaty episodes, nausea/vomitting, history of panic attacks. His kids have not been sick at home recently. Patient had carpal tunnel surgery in November 2015. Patient has not been exercising recently and is aware that he needs to lose some weight. 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    Patient has a history of hypertension and hyperlipidemia.  Seen last for chest pain by Dr. Nedra HaiLee 4 months ago and by myself 8 months ago.  He has morbid obesity with sleep apnea and metabolic syndrome.  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 He is on a CPAP machine for his sleep apnea. He last had his blood work checked 4 months ago.     Past Medical History  Diagnosis Date  . Hernia     bi lateral hernia repair  .  Hypertension   . Elevated cholesterol   . Allergy   . Sleep apnea 2014    severe    Current Outpatient Prescriptions on File Prior to Visit  Medication Sig Dispense Refill  . fenofibrate (TRICOR) 145 MG tablet TAKE 1 TABLET (145 MG TOTAL) BY MOUTH DAILY. 30 tablet 4  . lisinopril (PRINIVIL,ZESTRIL) 20 MG tablet TAKE 1 TABLET (20 MG TOTAL) BY MOUTH DAILY. 30 tablet 5  . rosuvastatin (CRESTOR) 10 MG tablet Take 1 tablet (10 mg total) by mouth daily. (Patient not taking: Reported on 01/28/2015) 90 tablet 3   No current facility-administered medications on file prior to visit.   Allergies  Allergen Reactions  . Erythromycin Base       Review of Systems  Constitutional: Positive for chills. Negative for fever.  Respiratory: Negative for cough and shortness of breath.   Cardiovascular: Positive for chest pain.  Gastrointestinal: Negative for nausea and vomiting.  Musculoskeletal: Positive for myalgias.   BP 122/74 mmHg  Pulse 88  Temp(Src) 97.8 F (36.6 C) (Oral)  Resp 16  Ht 5' 6.5" (1.689 m)  Wt 245 lb 4 oz (111.245 kg)  BMI 39.00 kg/m2  SpO2 97%     Objective:   Physical Exam  Constitutional: He is oriented to  person, place, and time. He appears well-developed and well-nourished. No distress.  HENT:  Head: Normocephalic and atraumatic.  Right Ear: Tympanic membrane is erythematous.  Left Ear: Tympanic membrane is erythematous.  Nasal mucosal edema and erythema oropharynx erythematous.   Eyes: Conjunctivae and EOM are normal.  Neck: Neck supple. No tracheal deviation present.  Cardiovascular: Normal rate, regular rhythm, normal heart sounds and normal pulses.   Pulmonary/Chest: Effort normal. No respiratory distress. He has no decreased breath sounds. He has no wheezes.  Good air movement     Musculoskeletal: Normal range of motion.  positive neer's and hawkins on the right shoulder  strength is 5/5 with deltoid, bicep and tricep Normal empty can test and normal  strength on internal and extenal rotation   Neurological: He is alert and oriented to person, place, and time.  Skin: Skin is warm and dry.  Psychiatric: He has a normal mood and affect. His behavior is normal.  Nursing note and vitals reviewed.  EKG: normal sinus rhythm   [facility] (PRIMARY) x-ray report read by Dr. Clelia Croft Chest X ray: no acute abnormality  Right Shoulder X ray: no acute abnormality   Dg Chest 2 View  01/28/2015   CLINICAL DATA:  Chest pain for several days.  EXAM: CHEST  2 VIEW  COMPARISON:  Chest radiograph 09/20/2014  FINDINGS: Normal cardiac and mediastinal contours. No consolidative pulmonary opacities. No pleural effusion or pneumothorax. Regional skeleton is unremarkable.  IMPRESSION: No acute cardiopulmonary process.   Electronically Signed   By: Annia Belt M.D.   On: 01/28/2015 16:25   Dg Shoulder Right  01/28/2015   CLINICAL DATA:  Patient with right shoulder pain. No traumatic injury.  EXAM: RIGHT SHOULDER - 2+ VIEW  COMPARISON:  None.  FINDINGS: There is no evidence of fracture or dislocation. There is no evidence of arthropathy or other focal bone abnormality. Soft tissues are unremarkable.  IMPRESSION: Negative.   Electronically Signed   By: Annia Belt M.D.   On: 01/28/2015 16:24     Results for orders placed or performed in visit on 01/28/15  POCT CBC  Result Value Ref Range   WBC 8.8 4.6 - 10.2 K/uL   Lymph, poc 3.7 (A) 0.6 - 3.4   POC LYMPH PERCENT 41.5 10 - 50 %L   MID (cbc) 0.7 0 - 0.9   POC MID % 7.8 0 - 12 %M   POC Granulocyte 4.5 2 - 6.9   Granulocyte percent 50.7 37 - 80 %G   RBC 6.05 4.69 - 6.13 M/uL   Hemoglobin 14.4 14.1 - 18.1 g/dL   HCT, POC 16.1 09.6 - 53.7 %   MCV 77.0 (A) 80 - 97 fL   MCH, POC 23.8 (A) 27 - 31.2 pg   MCHC 30.9 (A) 31.8 - 35.4 g/dL   RDW, POC 04.5 %   Platelet Count, POC 296 142 - 424 K/uL   MPV 7.4 0 - 99.8 fL      Assessment & Plan:    Other chest pain - Plan: EKG 12-Lead, POCT CBC, Sedimentation rate,  C-reactive protein, Comprehensive metabolic panel, CK, DG Chest 2 View  Right-sided chest wall pain - Plan: POCT CBC, Sedimentation rate, C-reactive protein, Comprehensive metabolic panel, CK, DG Shoulder Right, DG Chest 2 View  Right shoulder pain - Plan: DG Shoulder Right  Impingement syndrome of right shoulder - Plan: DG Shoulder Right  Obstructive sleep apnea  Essential hypertension, benign  BMI 38.0-38.9,adult  Allergic rhinitis, unspecified allergic rhinitis  type  Costochondritis  Meds ordered this encounter  Medications  . cetirizine (ZYRTEC) 10 MG tablet    Sig: Take 1 tablet (10 mg total) by mouth daily.    Dispense:  30 tablet    Refill:  11  . triamcinolone (NASACORT AQ) 55 MCG/ACT AERO nasal inhaler    Sig: Place 2 sprays into the nose daily.    Dispense:  1 Inhaler    Refill:  12  . meloxicam (MOBIC) 15 MG tablet    Sig: Take 1 tablet (15 mg total) by mouth daily.    Dispense:  30 tablet    Refill:  0    I personally performed the services described in this documentation, which was scribed in my presence. The recorded information has been reviewed and considered, and addended by me as needed.  Walter Sorenson, MD MPH

## 2015-01-28 NOTE — Patient Instructions (Signed)

## 2015-01-29 LAB — COMPREHENSIVE METABOLIC PANEL
ALBUMIN: 4.4 g/dL (ref 3.5–5.2)
ALK PHOS: 68 U/L (ref 39–117)
ALT: 36 U/L (ref 0–53)
AST: 27 U/L (ref 0–37)
BILIRUBIN TOTAL: 0.4 mg/dL (ref 0.2–1.2)
BUN: 11 mg/dL (ref 6–23)
CO2: 28 mEq/L (ref 19–32)
Calcium: 9.7 mg/dL (ref 8.4–10.5)
Chloride: 101 mEq/L (ref 96–112)
Creat: 0.98 mg/dL (ref 0.50–1.35)
GLUCOSE: 94 mg/dL (ref 70–99)
POTASSIUM: 4.3 meq/L (ref 3.5–5.3)
SODIUM: 139 meq/L (ref 135–145)
Total Protein: 7.4 g/dL (ref 6.0–8.3)

## 2015-01-29 LAB — CK: Total CK: 323 U/L — ABNORMAL HIGH (ref 7–232)

## 2015-01-29 LAB — C-REACTIVE PROTEIN: CRP: 0.7 mg/dL — ABNORMAL HIGH (ref <0.60)

## 2015-01-30 LAB — SEDIMENTATION RATE: SED RATE: 4 mm/h (ref 0–15)

## 2015-03-04 ENCOUNTER — Ambulatory Visit (INDEPENDENT_AMBULATORY_CARE_PROVIDER_SITE_OTHER): Payer: BLUE CROSS/BLUE SHIELD

## 2015-03-04 ENCOUNTER — Ambulatory Visit (INDEPENDENT_AMBULATORY_CARE_PROVIDER_SITE_OTHER): Payer: BLUE CROSS/BLUE SHIELD | Admitting: Emergency Medicine

## 2015-03-04 VITALS — BP 142/90 | HR 102 | Temp 98.9°F | Resp 17 | Ht 68.0 in | Wt 249.4 lb

## 2015-03-04 DIAGNOSIS — R05 Cough: Secondary | ICD-10-CM | POA: Diagnosis not present

## 2015-03-04 DIAGNOSIS — J209 Acute bronchitis, unspecified: Secondary | ICD-10-CM

## 2015-03-04 DIAGNOSIS — R059 Cough, unspecified: Secondary | ICD-10-CM

## 2015-03-04 DIAGNOSIS — R1310 Dysphagia, unspecified: Secondary | ICD-10-CM | POA: Diagnosis not present

## 2015-03-04 MED ORDER — CLARITHROMYCIN 500 MG PO TABS: 500.0000 mg | ORAL_TABLET | Freq: Two times a day (BID) | ORAL | Status: DC

## 2015-03-04 MED ORDER — HYDROCOD POLST-CHLORPHEN POLST 10-8 MG/5ML PO LQCR: 5.0000 mL | Freq: Two times a day (BID) | ORAL | Status: DC | PRN

## 2015-03-04 NOTE — Progress Notes (Signed)
[facility] [address], [place] [address] [phone]  Date:  03/04/2015   Name:  Walter Reynolds.   DOB:  [birth year]   MRN:  [id number]  PCP:  Abbe Amsterdam, MD    Chief Complaint: Cough; Sore Throat; Generalized Body Aches; and Chills   History of Present Illness:  Walter Reynolds. is a 44 y.o. very pleasant male patient who presents with the following:  Ill for three weeks with nasal congestion and post nasal drainage Has a cough is worse at night.  No wheezing or shortness of breath Cough now productive of purulent sputum.   Fever and chills Sore chest wall muscles Sore throat.  No nausea or vomiting No stool change Says for past month has experienced difficulty swallowing food. Says he has to drink water with his food so it will "go down" without choking. Some heartburn.  No GERD symptoms. Some fried food intolerance Excess caffeine. No improvement with over the counter medications or other home remedies.  Denies other complaint or health concern today.   Patient Active Problem List   Diagnosis Date Noted  . Prediabetes 06/13/2014  . Metabolic syndrome 06/13/2014  . Unspecified sleep apnea 11/17/2013    Past Medical History  Diagnosis Date  . Hernia     bi lateral hernia repair  . Hypertension   . Elevated cholesterol   . Allergy   . Sleep apnea 2014    severe    Past Surgical History  Procedure Laterality Date  . Inguinal hernia repair      times two, each side  . Kidney stone surgery    . Carpal tunnel release Right 2007  . Mass excision Left 10/28/2014    Procedure: LEFT WRIST VOLAR MASS EXCISION;  Surgeon: Dairl Ponder, MD;  Location: [facility];  Service: Orthopedics;  Laterality: Left;  . Carpal tunnel release Left 10/28/2014    Procedure: LEFT CARPAL TUNNEL RELEASE;  Surgeon: Dairl Ponder, MD;  Location: [facility];  Service: Orthopedics;  Laterality: Left;    History   Substance Use Topics  . Smoking status: Never Smoker   . Smokeless tobacco: Never Used  . Alcohol Use: No    Family History  Problem Relation Age of Onset  . Diabetes Mother   . Stroke Mother     4 strokes  . Hypertension Father   . Hypertension Sister   . Heart disease Sister     pacemaker    Allergies  Allergen Reactions  . Erythromycin Base     Medication list has been reviewed and updated.  Current Outpatient Prescriptions on File Prior to Visit  Medication Sig Dispense Refill  . cetirizine (ZYRTEC) 10 MG tablet Take 1 tablet (10 mg total) by mouth daily. 30 tablet 11  . fenofibrate (TRICOR) 145 MG tablet Take 1 tablet (145 mg total) by mouth daily. 30 tablet 2  . lisinopril (PRINIVIL,ZESTRIL) 20 MG tablet TAKE 1 TABLET (20 MG TOTAL) BY MOUTH DAILY. 30 tablet 5  . meloxicam (MOBIC) 15 MG tablet Take 1 tablet (15 mg total) by mouth daily. (Patient not taking: Reported on 03/04/2015) 30 tablet 0  . rosuvastatin (CRESTOR) 10 MG tablet Take 1 tablet (10 mg total) by mouth daily. (Patient not taking: Reported on 01/28/2015) 90 tablet 3  . triamcinolone (NASACORT AQ) 55 MCG/ACT AERO nasal inhaler Place 2 sprays into the nose daily. (Patient not taking: Reported on 03/04/2015) 1 Inhaler 12   No current facility-administered  medications on file prior to visit.    Review of Systems:  As per HPI, otherwise negative.    Physical Examination: Filed Vitals:   03/04/15 1618  BP: 142/90  Pulse: 102  Temp: 98.9 F (37.2 C)  Resp: 17   Filed Vitals:   03/04/15 1618  Height: 5\' 8"  (1.727 m)  Weight: 249 lb 6.4 oz (113.127 kg)   Body mass index is 37.93 kg/(m^2). Ideal Body Weight: Weight in (lb) to have BMI = 25: 164.1  GEN: obese with persistent cough, NAD, Non-toxic, A & O x 3 HEENT: Atraumatic, Normocephalic. Neck supple. No masses, No LAD. Ears and Nose: No external deformity. CV: RRR, No M/G/R. No JVD. No thrill. No extra heart sounds. PULM: CTA B, no wheezes,  crackles, rhonchi. No retractions. No resp. distress. No accessory muscle use. ABD: S, NT, ND, +BS. No rebound. No HSM. EXTR: No c/c/e NEURO Normal gait.  PSYCH: Normally interactive. Conversant. Not depressed or anxious appearing.  Calm demeanor.    Assessment and Plan: Bronchitis Dysphagia biaxin tussionex GI consult ???hiatal hernia  Signed,  Phillips OdorJeffery Anderson, MD   [facility] reading (PRIMARY) by  Dr. Dareen PianoAnderson.  ??hiatal hernia.

## 2015-03-04 NOTE — Patient Instructions (Signed)

## 2015-03-06 ENCOUNTER — Encounter: Payer: Self-pay | Admitting: Family Medicine

## 2015-04-17 ENCOUNTER — Other Ambulatory Visit: Payer: Self-pay | Admitting: Family Medicine

## 2015-06-01 ENCOUNTER — Ambulatory Visit (INDEPENDENT_AMBULATORY_CARE_PROVIDER_SITE_OTHER): Payer: BLUE CROSS/BLUE SHIELD | Admitting: Family Medicine

## 2015-06-01 VITALS — BP 144/82 | HR 81 | Temp 98.3°F | Resp 16 | Ht 67.0 in | Wt 248.0 lb

## 2015-06-01 DIAGNOSIS — E785 Hyperlipidemia, unspecified: Secondary | ICD-10-CM

## 2015-06-01 DIAGNOSIS — K0510 Chronic gingivitis, plaque induced: Secondary | ICD-10-CM

## 2015-06-01 DIAGNOSIS — L509 Urticaria, unspecified: Secondary | ICD-10-CM | POA: Diagnosis not present

## 2015-06-01 LAB — CBC WITH DIFFERENTIAL/PLATELET
Basophils Absolute: 0 10*3/uL (ref 0.0–0.1)
Basophils Relative: 0 % (ref 0–1)
Eosinophils Absolute: 0.2 10*3/uL (ref 0.0–0.7)
Eosinophils Relative: 3 % (ref 0–5)
HCT: 43 % (ref 39.0–52.0)
Hemoglobin: 13.6 g/dL (ref 13.0–17.0)
Lymphocytes Relative: 35 % (ref 12–46)
Lymphs Abs: 2.6 10*3/uL (ref 0.7–4.0)
MCH: 23.4 pg — ABNORMAL LOW (ref 26.0–34.0)
MCHC: 31.6 g/dL (ref 30.0–36.0)
MCV: 74.1 fL — ABNORMAL LOW (ref 78.0–100.0)
MPV: 10.2 fL (ref 8.6–12.4)
Monocytes Absolute: 0.6 10*3/uL (ref 0.1–1.0)
Monocytes Relative: 8 % (ref 3–12)
Neutro Abs: 4.1 10*3/uL (ref 1.7–7.7)
Neutrophils Relative %: 54 % (ref 43–77)
Platelets: 289 10*3/uL (ref 150–400)
RBC: 5.8 MIL/uL (ref 4.22–5.81)
RDW: 15.8 % — ABNORMAL HIGH (ref 11.5–15.5)
WBC: 7.5 10*3/uL (ref 4.0–10.5)

## 2015-06-01 LAB — COMPLETE METABOLIC PANEL WITH GFR
ALT: 28 U/L (ref 0–53)
AST: 27 U/L (ref 0–37)
Albumin: 4.2 g/dL (ref 3.5–5.2)
Alkaline Phosphatase: 60 U/L (ref 39–117)
BUN: 14 mg/dL (ref 6–23)
CO2: 27 mEq/L (ref 19–32)
Calcium: 9 mg/dL (ref 8.4–10.5)
Chloride: 103 mEq/L (ref 96–112)
Creat: 1.02 mg/dL (ref 0.50–1.35)
GFR, Est African American: >89 mL/min
GFR, Est Non African American: 88 mL/min
Glucose, Bld: 86 mg/dL (ref 70–99)
Potassium: 4.4 mEq/L (ref 3.5–5.3)
Sodium: 139 mEq/L (ref 135–145)
Total Bilirubin: 0.4 mg/dL (ref 0.2–1.2)
Total Protein: 6.9 g/dL (ref 6.0–8.3)

## 2015-06-01 LAB — LIPID PANEL
Cholesterol: 168 mg/dL (ref 0–200)
HDL: 24 mg/dL — ABNORMAL LOW (ref >=40)
LDL Cholesterol: 106 mg/dL — ABNORMAL HIGH (ref 0–99)
Total CHOL/HDL Ratio: 7 Ratio
Triglycerides: 189 mg/dL — ABNORMAL HIGH (ref <150)
VLDL: 38 mg/dL (ref 0–40)

## 2015-06-01 MED ORDER — PREDNISONE 20 MG PO TABS
ORAL_TABLET | ORAL | Status: DC
Start: 2015-06-01 — End: 2015-06-03

## 2015-06-01 MED ORDER — PENICILLIN V POTASSIUM 500 MG PO TABS: 500.0000 mg | ORAL_TABLET | Freq: Three times a day (TID) | ORAL | Status: DC

## 2015-06-01 NOTE — Progress Notes (Signed)
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 He's been having some dental work and yesterday he was seen by his dentist who put him on doxycycline. Aref patient's had an allergic reaction to erythromycin with hives in the past. He developed hives this morning all over his chest and arms. He's also had a little tightness in his throat. He's had no trouble breathing.  Patient also has hyperlipidemia and would like his liver functions checked. He's having no muscle cramps or headache.  Objective: NAD BP 144/82 mmHg  Pulse 81  Temp(Src) 98.3 F (36.8 C) (Oral)  Resp 16  Ht 5\' 7"  (1.702 m)  Wt 248 lb (112.492 kg)  BMI 38.83 kg/m2  SpO2 98% This patient is alert and cooperative HEENT: Patient has ecchymotic gum line along tooth #31 and 32.  Chest: Clear Heart: Regular no murmur Abdomen: Diffuse urticarial eruption Extremities: Urticarial lesions over both arms  Assessment: Hyperlipidemia and acute hive reaction.  Plan: Patient told never to take erythromycin or doxycycline again Penicillin the 503 times a day for one week to protect the teeth and gums Prednisone 20 mg 2 tablets daily 3 Hives - Plan: CBC with Differential/Platelet, predniSONE (DELTASONE) 20 MG tablet  Hyperlipidemia - Plan: COMPLETE METABOLIC PANEL WITH GFR, Lipid panel  Gingivitis - Plan: penicillin v potassium (VEETID) 500 MG tablet   Signed, Sheila Oats.D.

## 2015-06-03 ENCOUNTER — Ambulatory Visit (INDEPENDENT_AMBULATORY_CARE_PROVIDER_SITE_OTHER): Payer: BLUE CROSS/BLUE SHIELD | Admitting: Family Medicine

## 2015-06-03 VITALS — BP 120/80 | HR 89 | Temp 97.7°F

## 2015-06-03 DIAGNOSIS — L509 Urticaria, unspecified: Secondary | ICD-10-CM

## 2015-06-03 DIAGNOSIS — T50905D Adverse effect of unspecified drugs, medicaments and biological substances, subsequent encounter: Secondary | ICD-10-CM

## 2015-06-03 DIAGNOSIS — T887XXD Unspecified adverse effect of drug or medicament, subsequent encounter: Secondary | ICD-10-CM

## 2015-06-03 MED ORDER — METHYLPREDNISOLONE SODIUM SUCC 125 MG IJ SOLR
125.0000 mg | Freq: Once | INTRAMUSCULAR | Status: AC
  Administered 2015-06-03: 125 mg via INTRAMUSCULAR

## 2015-06-03 MED ORDER — PREDNISONE 20 MG PO TABS: ORAL_TABLET | ORAL | Status: DC

## 2015-06-03 NOTE — Progress Notes (Signed)
Subjective:  Patient ID: Walter Males., male    DOB: [birth year]  Age: 44 y.o. MRN: [id number]  44 year old man who was here 2 days ago with an allergic reaction to either doxycycline or hydrocodone. Please see the note from that day. He did a little better yesterday, but today the rash is back again on his trunk and upper extremities and left groin area. He is not having any worse problem with his mouth. He is taking the penicillin pills as directed. He finished the last prednisone this morning.   Objective:   Throat looks clear. Maybe a little swollen where he had the dental work done, but it is not inflamed looking. Neck supple without significant nodes. Chest clear. Heart regular without murmur. No CVA tenderness. Scattered urticarial appearing rash on his trunk and arms and left groin area    Assessment & Plan:   Assessment: Urticaria, probably secondary to either doxycycline or hydrocodone  Plan:  Treat vigorously still for a few more days. Give [place] shot of steroids today.. Patient Instructions  Take over-the-counter ranitidine (Zantac) 150 mg twice daily for 5 days  Take over-the-counter cetirizine (Zyrtec) one twice daily for 2 days, then 1 daily for anti-histamine  Take the prescription prednisone beginning tomorrow (you got a shot today) 3 pills daily for 2 days, then 2 daily for 2 days, then 1 daily for 2 days. Best taken after breakfast  If rash is getting at all worse or not improving you should return or go to the emergency room  The allergic reaction could be either from the hydrocodone pain pills or file the doxycycline antibiotically truck, and you should avoid these.   Hives Hives are itchy, red, swollen areas of the skin. They can vary in size and location on your body. Hives can come and go for hours or several days (acute hives) or for several weeks (chronic hives). Hives do not spread from person to person (noncontagious). They may get worse with scratching,  exercise, and emotional stress. CAUSES   Allergic reaction to food, additives, or drugs.  Infections, including the common cold.  Illness, such as vasculitis, lupus, or thyroid disease.  Exposure to sunlight, heat, or cold.  Exercise.  Stress.  Contact with chemicals. SYMPTOMS   Red or white swollen patches on the skin. The patches may change size, shape, and location quickly and repeatedly.  Itching.  Swelling of the hands, feet, and face. This may occur if hives develop deeper in the skin. DIAGNOSIS  Your caregiver can usually tell what is wrong by performing a physical exam. Skin or blood tests may also be done to determine the cause of your hives. In some cases, the cause cannot be determined. TREATMENT  Mild cases usually get better with medicines such as antihistamines. Severe cases may require an emergency epinephrine injection. If the cause of your hives is known, treatment includes avoiding that trigger.  HOME CARE INSTRUCTIONS   Avoid causes that trigger your hives.  Take antihistamines as directed by your caregiver to reduce the severity of your hives. Non-sedating or low-sedating antihistamines are usually recommended. Do not drive while taking an antihistamine.  Take any other medicines prescribed for itching as directed by your caregiver.  Wear loose-fitting clothing.  Keep all follow-up appointments as directed by your caregiver. SEEK MEDICAL CARE IF:   You have persistent or severe itching that is not relieved with medicine.  You have painful or swollen joints. SEEK IMMEDIATE MEDICAL CARE IF:   You have  a fever.  Your tongue or lips are swollen.  You have trouble breathing or swallowing.  You feel tightness in the throat or chest.  You have abdominal pain. These problems may be the first sign of a life-threatening allergic reaction. Call your local emergency services (911 in [place] MAKE SURE YOU:   Understand these instructions.  Will watch  your condition.  Will get help right away if you are not doing well or get worse. Document Released: 12/02/2005 Document Revised: 12/07/2013 Document Reviewed: 02/25/2012 [facility] Patient Information 2015 [place], [place]. This information is not intended to replace advice given to you by your health care provider. Make sure you discuss any questions you have with your health care provider.     HOPPER,DAVID, MD 06/03/2015

## 2015-06-03 NOTE — Patient Instructions (Addendum)
Take over-the-counter ranitidine (Zantac) 150 mg twice daily for 5 days  Take over-the-counter cetirizine (Zyrtec) one twice daily for 2 days, then 1 daily for anti-histamine  Take the prescription prednisone beginning tomorrow (you got a shot today) 3 pills daily for 2 days, then 2 daily for 2 days, then 1 daily for 2 days. Best taken after breakfast  If rash is getting at all worse or not improving you should return or go to the emergency room  The allergic reaction could be either from the hydrocodone pain pills or file the doxycycline antibiotically truck, and you should avoid these.   Hives Hives are itchy, red, swollen areas of the skin. They can vary in size and location on your body. Hives can come and go for hours or several days (acute hives) or for several weeks (chronic hives). Hives do not spread from person to person (noncontagious). They may get worse with scratching, exercise, and emotional stress. CAUSES   Allergic reaction to food, additives, or drugs.  Infections, including the common cold.  Illness, such as vasculitis, lupus, or thyroid disease.  Exposure to sunlight, heat, or cold.  Exercise.  Stress.  Contact with chemicals. SYMPTOMS   Red or white swollen patches on the skin. The patches may change size, shape, and location quickly and repeatedly.  Itching.  Swelling of the hands, feet, and face. This may occur if hives develop deeper in the skin. DIAGNOSIS  Your caregiver can usually tell what is wrong by performing a physical exam. Skin or blood tests may also be done to determine the cause of your hives. In some cases, the cause cannot be determined. TREATMENT  Mild cases usually get better with medicines such as antihistamines. Severe cases may require an emergency epinephrine injection. If the cause of your hives is known, treatment includes avoiding that trigger.  HOME CARE INSTRUCTIONS   Avoid causes that trigger your hives.  Take antihistamines  as directed by your caregiver to reduce the severity of your hives. Non-sedating or low-sedating antihistamines are usually recommended. Do not drive while taking an antihistamine.  Take any other medicines prescribed for itching as directed by your caregiver.  Wear loose-fitting clothing.  Keep all follow-up appointments as directed by your caregiver. SEEK MEDICAL CARE IF:   You have persistent or severe itching that is not relieved with medicine.  You have painful or swollen joints. SEEK IMMEDIATE MEDICAL CARE IF:   You have a fever.  Your tongue or lips are swollen.  You have trouble breathing or swallowing.  You feel tightness in the throat or chest.  You have abdominal pain. These problems may be the first sign of a life-threatening allergic reaction. Call your local emergency services (911 in [place] MAKE SURE YOU:   Understand these instructions.  Will watch your condition.  Will get help right away if you are not doing well or get worse. Document Released: 12/02/2005 Document Revised: 12/07/2013 Document Reviewed: 02/25/2012 [facility] Patient Information 2015 [place], [place]. This information is not intended to replace advice given to you by your health care provider. Make sure you discuss any questions you have with your health care provider.

## 2015-09-06 ENCOUNTER — Ambulatory Visit (INDEPENDENT_AMBULATORY_CARE_PROVIDER_SITE_OTHER): Payer: BLUE CROSS/BLUE SHIELD | Admitting: Cardiology

## 2015-09-06 ENCOUNTER — Encounter: Payer: Self-pay | Admitting: Cardiology

## 2015-09-06 VITALS — BP 110/68 | HR 93 | Ht 67.0 in | Wt 248.8 lb

## 2015-09-06 DIAGNOSIS — G4733 Obstructive sleep apnea (adult) (pediatric): Secondary | ICD-10-CM | POA: Insufficient documentation

## 2015-09-06 DIAGNOSIS — E669 Obesity, unspecified: Secondary | ICD-10-CM | POA: Diagnosis not present

## 2015-09-06 DIAGNOSIS — R079 Chest pain, unspecified: Secondary | ICD-10-CM | POA: Diagnosis not present

## 2015-09-06 DIAGNOSIS — I10 Essential (primary) hypertension: Secondary | ICD-10-CM | POA: Insufficient documentation

## 2015-09-06 NOTE — Patient Instructions (Signed)
Medication Instructions:  Your physician recommends that you continue on your current medications as directed. Please refer to the Current Medication list given to you today.   Labwork: None  Testing/Procedures: Your physician has requested that you have an echocardiogram. Echocardiography is a painless test that uses sound waves to create images of your heart. It provides your doctor with information about the size and shape of your heart and how well your heart's chambers and valves are working. This procedure takes approximately one hour. There are no restrictions for this procedure.   Dr. Mayford Knife recommends you have a NUCLEAR STRESS TEST.  Follow-Up: Your physician wants you to follow-up in: 6 months with Dr. Mayford Knife. You will receive a reminder letter in the mail two months in advance. If you don't receive a letter, please call our office to schedule the follow-up appointment.   Any Other Special Instructions Will Be Listed Below (If Applicable).

## 2015-09-06 NOTE — Progress Notes (Signed)
Cardiology Office Note   Date:  09/06/2015   ID:  Walter Males., DOB [birth year], MRN [id number]  PCP:  Abbe Amsterdam, MD    Chief Complaint  Patient presents with  . New Evaluation     sleep apnea and chest pain      History of Present Illness: Walter Reynolds. is a 44 y.o. male who presents for evaluation of palpitations.  He has been seen by [facility] in the past who referred him to Cardiology but he never went and is now here to "get his heart checked out".  He has OSA and uses a CPAP machine but is not very compliant with it.  He says that when he comes home from work at 2pm he will fall asleep.  He gets very sleepy at home and easily falls asleep.  He has been on CPAP for about a year.  He does no recall where he had the study done but uses [facility].  He uses a full face mask but his nose gets stuffed up.  He also uses a nasal mask sometimes as well.  He says that he snores unless he wears the mask.  If he uses the CPAP he feels rested in the am and has less daytime sleepiness but still has to take a nap at lunch and gets sleepy driving.  He also has been having some problems with tightness in his chest intermittently.  He describes it as a pressure over the left side with no radiation.  He denies any diaphoresis or nausea or SOB with the pain.  He has noticed some DOE from time to time.  He denies any LE edema except with flares of plantar fasciitis.  He denies any palptiations.      Past Medical History  Diagnosis Date  . Hernia     bi lateral hernia repair  . Hypertension   . Elevated cholesterol   . Allergy   . Sleep apnea 2014    severe    Past Surgical History  Procedure Laterality Date  . Inguinal hernia repair      times two, each side  . Kidney stone surgery    . Carpal tunnel release Right 2007  . Mass excision Left 10/28/2014    Procedure: LEFT WRIST VOLAR MASS EXCISION;  Surgeon: Dairl Ponder, MD;  Location: [facility];  Service: Orthopedics;  Laterality: Left;  . Carpal tunnel release Left 10/28/2014    Procedure: LEFT CARPAL TUNNEL RELEASE;  Surgeon: Dairl Ponder, MD;  Location: [facility];  Service: Orthopedics;  Laterality: Left;     Current Outpatient Prescriptions  Medication Sig Dispense Refill  . cetirizine (ZYRTEC) 10 MG tablet Take 1 tablet (10 mg total) by mouth daily. 30 tablet 11  . fenofibrate (TRICOR) 145 MG tablet Take 1 tablet (145 mg total) by mouth daily. 30 tablet 2  . lisinopril (PRINIVIL,ZESTRIL) 20 MG tablet TAKE 1 TABLET (20 MG TOTAL) BY MOUTH DAILY. 30 tablet 5   No current facility-administered medications for this visit.    Allergies:   Doxycycline and Erythromycin base    Social History:  The patient  reports that he has never smoked. He has never used smokeless tobacco. He reports that he does not drink alcohol or use illicit drugs.   Family History:  The patient's family history includes Diabetes in his mother; Heart disease in his  sister; Hypertension in his father and sister; Stroke in his mother.    ROS:  Please see the history of present illness.   Otherwise, review of systems are positive for none.   All other systems are reviewed and negative.    PHYSICAL EXAM: VS:  BP 110/68 mmHg  Pulse 93  Ht  (1.702 m)  Wt 248 lb 12.8 oz (112.855 kg)  BMI 38.96 kg/m2  SpO2 94% , BMI Body mass index is 38.96 kg/(m^2). GEN: Well nourished, well developed, in no acute distress HEENT: normal Neck: no JVD, carotid bruits, or masses Cardiac: RRR; no murmurs, rubs, or gallops,no edema  Respiratory:  clear to auscultation bilaterally, normal work of breathing GI: soft, nontender, nondistended, + BS MS: no deformity or atrophy Skin: warm and dry, no rash Neuro:  Strength and sensation are intact Psych: euthymic mood, full affect   EKG:  EKG is not ordered today.    Recent Labs: 09/20/2014: TSH 1.544 06/01/2015: ALT 28; BUN 14;  Creat 1.02; Hemoglobin 13.6; Platelets 289; Potassium 4.4; Sodium 139    Lipid Panel    Component Value Date/Time   CHOL 168 06/01/2015 0951   TRIG 189* 06/01/2015 0951   HDL 24* 06/01/2015 0951   CHOLHDL 7.0 06/01/2015 0951   VLDL 38 06/01/2015 0951   LDLCALC 106* 06/01/2015 0951      Wt Readings from Last 3 Encounters:  09/06/15 248 lb 12.8 oz (112.855 kg)  06/01/15 248 lb (112.492 kg)  03/04/15 249 lb 6.4 oz (113.127 kg)       ASSESSMENT AND PLAN:  1.  OSA currently on CPAP and tolerating well.  He is not very compliant with his device but when he uses he has less daytime sleepiness.  I have encouraged him to be more compliant with his CPAP device.  I will get a d/l from the DME.  I will also order a chin strap to see if it helps with his mouth dryness.  I recommended that he try nasal saline spray for his nasal congestion. 2.  Obesity - I encouraged him to get into a routine exercise program and work on diet. 3.  HTN - controlled on ACE I 4.  Chest pain with risk factors including obesity, HTN, dyslipidemia and family history of CAD.  I will get a stress myoview to rule out ischemia.   Current medicines are reviewed at length with the patient today.  The patient does not have concerns regarding medicines.  The following changes have been made:  no change  Labs/ tests ordered today: See above Assessment and Plan No orders of the defined types were placed in this encounter.     Disposition:   FU with me in 6 months  Signed, Quintella Reichert, MD  09/06/2015 4:14 PM    [facility] [address] [place], [place], [place]  [address] Phone: [phone]; Fax: [phone]

## 2015-09-12 ENCOUNTER — Telehealth (HOSPITAL_COMMUNITY): Payer: Self-pay | Admitting: *Deleted

## 2015-09-12 NOTE — Telephone Encounter (Signed)
Patient given detailed instructions per Myocardial Perfusion Study Information Sheet for test on 09/14/15 at 0930. Patient notified to arrive 15 minutes early and that it is imperative to arrive on time for appointment to keep from having the test rescheduled.  If you need to cancel or reschedule your appointment, please call the office within 24 hours of your appointment. Failure to do so may result in a cancellation of your appointment, and a $50 no show fee. Patient verbalized understanding. Melvyn Novas

## 2015-09-14 ENCOUNTER — Other Ambulatory Visit: Payer: Self-pay

## 2015-09-14 ENCOUNTER — Ambulatory Visit (HOSPITAL_COMMUNITY): Payer: BLUE CROSS/BLUE SHIELD | Attending: Cardiology

## 2015-09-14 ENCOUNTER — Ambulatory Visit (HOSPITAL_BASED_OUTPATIENT_CLINIC_OR_DEPARTMENT_OTHER): Payer: BLUE CROSS/BLUE SHIELD

## 2015-09-14 DIAGNOSIS — I371 Nonrheumatic pulmonary valve insufficiency: Secondary | ICD-10-CM | POA: Diagnosis not present

## 2015-09-14 DIAGNOSIS — R079 Chest pain, unspecified: Secondary | ICD-10-CM | POA: Diagnosis not present

## 2015-09-14 DIAGNOSIS — I517 Cardiomegaly: Secondary | ICD-10-CM | POA: Diagnosis not present

## 2015-09-14 DIAGNOSIS — I071 Rheumatic tricuspid insufficiency: Secondary | ICD-10-CM | POA: Insufficient documentation

## 2015-09-14 DIAGNOSIS — I10 Essential (primary) hypertension: Secondary | ICD-10-CM | POA: Insufficient documentation

## 2015-09-14 DIAGNOSIS — R0609 Other forms of dyspnea: Secondary | ICD-10-CM | POA: Diagnosis not present

## 2015-09-14 LAB — MYOCARDIAL PERFUSION IMAGING
CHL CUP MPHR: 175 {beats}/min
CHL CUP NUCLEAR SRS: 1
CHL CUP NUCLEAR SSS: 2
CHL CUP RESTING HR STRESS: 75 {beats}/min
CSEPED: 10 min
CSEPEDS: 0 s
CSEPPHR: 164 {beats}/min
Estimated workload: 11.7 METS
LVDIAVOL: 129 mL
LVSYSVOL: 68 mL
NUC STRESS TID: 0.96
Percent HR: 93 %
RATE: 0.32
SDS: 1

## 2015-09-14 MED ORDER — TECHNETIUM TC 99M SESTAMIBI GENERIC - CARDIOLITE
10.9000 | Freq: Once | INTRAVENOUS | Status: AC | PRN
  Administered 2015-09-14: 10.9 via INTRAVENOUS

## 2015-09-14 MED ORDER — TECHNETIUM TC 99M SESTAMIBI GENERIC - CARDIOLITE
33.0000 | Freq: Once | INTRAVENOUS | Status: AC | PRN
  Administered 2015-09-14: 33 via INTRAVENOUS

## 2015-09-18 ENCOUNTER — Other Ambulatory Visit: Payer: Self-pay | Admitting: Family Medicine

## 2015-10-23 ENCOUNTER — Ambulatory Visit (INDEPENDENT_AMBULATORY_CARE_PROVIDER_SITE_OTHER): Payer: BLUE CROSS/BLUE SHIELD | Admitting: Physician Assistant

## 2015-10-23 VITALS — BP 138/76 | HR 90 | Temp 97.5°F | Resp 16 | Ht 68.0 in | Wt 252.2 lb

## 2015-10-23 DIAGNOSIS — Z760 Encounter for issue of repeat prescription: Secondary | ICD-10-CM | POA: Diagnosis not present

## 2015-10-23 DIAGNOSIS — R7303 Prediabetes: Secondary | ICD-10-CM | POA: Diagnosis not present

## 2015-10-23 DIAGNOSIS — Z719 Counseling, unspecified: Secondary | ICD-10-CM | POA: Diagnosis not present

## 2015-10-23 DIAGNOSIS — D509 Iron deficiency anemia, unspecified: Secondary | ICD-10-CM | POA: Diagnosis not present

## 2015-10-23 LAB — POCT CBC
GRANULOCYTE PERCENT: 54.7 % (ref 37–80)
HEMATOCRIT: 43.1 % — AB (ref 43.5–53.7)
Hemoglobin: 13.4 g/dL — AB (ref 14.1–18.1)
Lymph, poc: 4 — AB (ref 0.6–3.4)
MCH, POC: 23.5 pg — AB (ref 27–31.2)
MCHC: 31.2 g/dL — AB (ref 31.8–35.4)
MCV: 75.3 fL — AB (ref 80–97)
MID (cbc): 0.5 (ref 0–0.9)
MPV: 7.9 fL (ref 0–99.8)
POC GRANULOCYTE: 5.4 (ref 2–6.9)
POC LYMPH %: 39.9 % (ref 10–50)
POC MID %: 5.4 %M (ref 0–12)
Platelet Count, POC: 281 10*3/uL (ref 142–424)
RBC: 5.72 M/uL (ref 4.69–6.13)
RDW, POC: 16.6 %
WBC: 9.9 10*3/uL (ref 4.6–10.2)

## 2015-10-23 LAB — HEMOGLOBIN A1C: Hgb A1c MFr Bld: 6.4 % — AB (ref 4.0–6.0)

## 2015-10-23 LAB — POCT GLYCOSYLATED HEMOGLOBIN (HGB A1C): Hemoglobin A1C: 6.4

## 2015-10-23 MED ORDER — LISINOPRIL 20 MG PO TABS: ORAL_TABLET | ORAL | Status: DC

## 2015-10-23 MED ORDER — METFORMIN HCL 500 MG PO TABS: 500.0000 mg | ORAL_TABLET | Freq: Two times a day (BID) | ORAL | Status: DC

## 2015-10-23 MED ORDER — FENOFIBRATE 145 MG PO TABS: 145.0000 mg | ORAL_TABLET | Freq: Every day | ORAL | Status: DC

## 2015-10-23 NOTE — Progress Notes (Signed)
10/23/2015 at 9:06 PM  Walter MalesIsaiah Pesce Jr. / DOB: [birth year] / MRN: [id number]  The patient has Prediabetes; Metabolic syndrome; OSA (obstructive sleep apnea); Benign essential HTN; and Obesity (BMI 30-39.9) on his problem list.  SUBJECTIVE  Walter Malessaiah Pietro Jr. is a [age] y.o. male who complains of need for medication refill.  He take Tricor and lisinopril daily, and has been out of these for roughly 1 week. He has a pertinent medical history of metabolic syndrome and obesity.  He just lost his mother in late September of this year and feels that it is time to make a change to his health. He eats fast food on most days of the week and has recently started substituting soda with lemonade and Gatorade.   He  has a past medical history of Hernia; Hypertension; Elevated cholesterol; Allergy; and Sleep apnea (2014).    Medications reviewed and updated by myself where necessary, and exist elsewhere in the encounter.   Walter Reynolds is allergic to doxycycline and erythromycin base. He  reports that he has never smoked. He has never used smokeless tobacco. He reports that he does not drink alcohol or use illicit drugs. He  reports that he currently engages in sexual activity. He reports using the following method of birth control/protection: Condom. The patient  has past surgical history that includes Inguinal hernia repair; Kidney stone surgery; Carpal tunnel release (Right, 2007); Mass excision (Left, 10/28/2014); and Carpal tunnel release (Left, 10/28/2014).  His family history includes Diabetes in his mother; Heart attack in his mother; Heart disease in his mother and sister; Hypertension in his father and sister; Stroke in his mother.  Review of Systems  Constitutional: Negative for fever and chills.  Respiratory: Negative for shortness of breath.   Cardiovascular: Negative for chest pain.  Gastrointestinal: Negative for nausea and abdominal pain.  Genitourinary: Negative.   Skin: Negative for rash.    Neurological: Negative for dizziness and headaches.    OBJECTIVE  His  height is 5\' 8"  (1.727 m) and weight is 252 lb 3.2 oz (114.397 kg). His oral temperature is 97.5 F (36.4 C). His blood pressure is 138/76 and his pulse is 90. His respiration is 16 and oxygen saturation is 98%.  The patient's body mass index is 38.36 kg/(m^2).  Physical Exam  Vitals reviewed. Constitutional: He is oriented to person, place, and time. He appears well-developed. No distress.  Eyes: EOM are normal. Pupils are equal, round, and reactive to light. No scleral icterus.  Neck: Normal range of motion.  Cardiovascular: Normal rate and regular rhythm.   Respiratory: Effort normal and breath sounds normal.  GI: He exhibits no distension.  Musculoskeletal: Normal range of motion.  Neurological: He is alert and oriented to person, place, and time. No cranial nerve deficit.  Skin: Skin is warm and dry. No rash noted. He is not diaphoretic.  Psychiatric: He has a normal mood and affect.    Results for orders placed or performed in visit on 10/23/15 (from the past 24 hour(s))  POCT CBC     Status: Abnormal   Collection Time: 10/23/15  7:37 PM  Result Value Ref Range   WBC 9.9 4.6 - 10.2 K/uL   Lymph, poc 4.0 (A) 0.6 - 3.4   POC LYMPH PERCENT 39.9 10 - 50 %L   MID (cbc) 0.5 0 - 0.9   POC MID % 5.4 0 - 12 %M   POC Granulocyte 5.4 2 - 6.9   Granulocyte percent 54.7 37 -  80 %G   RBC 5.72 4.69 - 6.13 M/uL   Hemoglobin 13.4 (A) 14.1 - 18.1 g/dL   HCT, POC 16.1 (A) 09.6 - 53.7 %   MCV 75.3 (A) 80 - 97 fL   MCH, POC 23.5 (A) 27 - 31.2 pg   MCHC 31.2 (A) 31.8 - 35.4 g/dL   RDW, POC 04.5 %   Platelet Count, POC 281 142 - 424 K/uL   MPV 7.9 0 - 99.8 fL  POCT glycosylated hemoglobin (Hb A1C)     Status: None   Collection Time: 10/23/15  7:38 PM  Result Value Ref Range   Hemoglobin A1C 6.4     ASSESSMENT & PLAN  Walter Reynolds was seen today for medication refill and follow-up.  Diagnoses and all orders for this  visit:  Encounter for medication refill -     POCT glycosylated hemoglobin (Hb A1C) -     POCT CBC -     COMPLETE METABOLIC PANEL WITH GFR -     Microalbumin, urine -     Lipid panel -     lisinopril (PRINIVIL,ZESTRIL) 20 MG tablet; TAKE 1 TABLET (20 MG TOTAL) BY MOUTH DAILY. -     Discontinue: fenofibrate (TRICOR) 145 MG tablet; Take 1 tablet (145 mg total) by mouth daily. -     fenofibrate (TRICOR) 145 MG tablet; Take 1 tablet (145 mg total) by mouth daily.  Prediabetes: He has numerous risk factors.  Will start metformin for now and if he can lose weight will pull this off at follow up. Weight loss and cessation of energy rich calories discussed.  He will follow up with me in 6 months at 104 and I have advised that he reduce his weight to 220 by that time. This equates to a weight loss of 1.4 lbs per week.   -     metFORMIN (GLUCOPHAGE) 500 MG tablet; Take 1 tablet (500 mg total) by mouth 2 (two) times daily with a meal. -     Ambulatory referral to diabetic education  Microcytic anemia: Will rule out thalassemia.  -     Ferritin -     Iron and TIBC -     Pathologist smear review  Health education/counseling: See problem 2. Starting 81 ASA qd.      The patient was advised to call or come back to clinic if he does not see an improvement in symptoms, or worsens with the above plan.   Deliah Boston, MHS, PA-C [facility] 10/23/2015 9:06 PM

## 2015-10-23 NOTE — Addendum Note (Signed)
Addended by: Ofilia NeasLARK, Whitt Auletta L on: 10/23/2015 10:11 PM   Modules accepted: Orders

## 2015-10-23 NOTE — Addendum Note (Signed)
Addended by: Ofilia NeasLARK, MICHAEL L on: 10/23/2015 10:25 PM   Modules accepted: Orders

## 2015-10-24 LAB — COMPLETE METABOLIC PANEL WITH GFR
ALBUMIN: 4.3 g/dL (ref 3.6–5.1)
ALT: 24 U/L (ref 9–46)
AST: 20 U/L (ref 10–40)
Alkaline Phosphatase: 73 U/L (ref 40–115)
BUN: 21 mg/dL (ref 7–25)
CHLORIDE: 102 mmol/L (ref 98–110)
CO2: 26 mmol/L (ref 20–31)
Calcium: 9.5 mg/dL (ref 8.6–10.3)
Creat: 1.26 mg/dL (ref 0.60–1.35)
GFR, EST NON AFRICAN AMERICAN: 68 mL/min (ref >=60)
GFR, Est African American: 79 mL/min (ref >=60)
GLUCOSE: 112 mg/dL — AB (ref 65–99)
POTASSIUM: 4 mmol/L (ref 3.5–5.3)
SODIUM: 140 mmol/L (ref 135–146)
Total Bilirubin: 0.2 mg/dL (ref 0.2–1.2)
Total Protein: 6.8 g/dL (ref 6.1–8.1)

## 2015-10-24 LAB — LIPID PANEL
CHOL/HDL RATIO: 10.8 ratio — AB (ref <=5.0)
Cholesterol: 195 mg/dL (ref 125–200)
HDL: 18 mg/dL — AB (ref >=40)
LDL CALC: 108 mg/dL (ref <130)
TRIGLYCERIDES: 345 mg/dL — AB (ref <150)
VLDL: 69 mg/dL — AB (ref <30)

## 2015-10-24 LAB — IRON AND TIBC
%SAT: 16 % (ref 15–60)
Iron: 51 ug/dL (ref 50–180)
TIBC: 327 ug/dL (ref 250–425)
UIBC: 276 ug/dL (ref 125–400)

## 2015-10-24 LAB — FERRITIN: FERRITIN: 296 ng/mL (ref 22–322)

## 2015-10-24 LAB — MICROALBUMIN, URINE: Microalb, Ur: 0.2 mg/dL

## 2015-10-25 ENCOUNTER — Encounter: Payer: Self-pay | Admitting: Family Medicine

## 2015-10-25 ENCOUNTER — Other Ambulatory Visit: Payer: Self-pay | Admitting: Physician Assistant

## 2015-10-25 DIAGNOSIS — D509 Iron deficiency anemia, unspecified: Secondary | ICD-10-CM

## 2015-10-25 LAB — PATHOLOGIST SMEAR REVIEW

## 2015-10-25 MED ORDER — FERROUS FUMARATE 325 (106 FE) MG PO TABS: 1.0000 | ORAL_TABLET | Freq: Two times a day (BID) | ORAL | Status: DC

## 2015-10-25 NOTE — Progress Notes (Signed)
44 year old non smoking male with CBC and follow up studies consistent with Iron deficiency anemia.  He was asymptomatic at our last visit. This could be representative for poor diet.  Will recheck CBC and Iron in one month.  If persist will refer to GI for further evaluation. Deliah BostonMichael Zia Najera, MS, PA-C   1:43 PM, 10/25/2015

## 2015-12-20 ENCOUNTER — Ambulatory Visit (INDEPENDENT_AMBULATORY_CARE_PROVIDER_SITE_OTHER): Payer: BLUE CROSS/BLUE SHIELD | Admitting: Emergency Medicine

## 2015-12-20 VITALS — BP 128/68 | HR 83 | Temp 98.3°F | Resp 16 | Ht 68.0 in | Wt 237.0 lb

## 2015-12-20 DIAGNOSIS — E669 Obesity, unspecified: Secondary | ICD-10-CM

## 2015-12-20 DIAGNOSIS — R7303 Prediabetes: Secondary | ICD-10-CM

## 2015-12-20 LAB — POCT GLYCOSYLATED HEMOGLOBIN (HGB A1C): Hemoglobin A1C: 6.3

## 2015-12-20 LAB — GLUCOSE, POCT (MANUAL RESULT ENTRY): POC GLUCOSE: 98 mg/dL (ref 70–99)

## 2015-12-20 NOTE — Progress Notes (Signed)
Subjective:  Patient ID: Walter Reynolds., male    DOB: [birth year]  Age: 45 y.o. MRN: [id number]  CC: Follow-up   HPI Walter Reynolds. presents  patient was told on a recent visit that he had "prediabetes" and was put on metformin he had a pound weight loss the interval and come back today for a blood sugar check. He has no adverse effect of taking the medication. He is concerned about labile diabetes in his sugars were not very not really elevated. He denies any other complaint.  History Haru has a past medical history of Hernia; Hypertension; Elevated cholesterol; Allergy; and Sleep apnea (2014).   He has past surgical history that includes Inguinal hernia repair; Kidney stone surgery; Carpal tunnel release (Right, 2007); Mass excision (Left, 10/28/2014); and Carpal tunnel release (Left, 10/28/2014).   His  family history includes Diabetes in his mother; Heart attack in his mother; Heart disease in his mother and sister; Hypertension in his father and sister; Stroke in his mother.  He   reports that he has never smoked. He has never used smokeless tobacco. He reports that he does not drink alcohol or use illicit drugs.  Outpatient Prescriptions Prior to Visit  Medication Sig Dispense Refill  . fenofibrate (TRICOR) 145 MG tablet Take 1 tablet (145 mg total) by mouth daily. 30 tablet 2  . lisinopril (PRINIVIL,ZESTRIL) 20 MG tablet TAKE 1 TABLET (20 MG TOTAL) BY MOUTH DAILY. 30 tablet 6  . metFORMIN (GLUCOPHAGE) 500 MG tablet Take 1 tablet (500 mg total) by mouth 2 (two) times daily with a meal. 60 tablet 6  . ferrous fumarate (HEMOCYTE - 106 MG FE) 325 (106 FE) MG TABS tablet Take 1 tablet (106 mg of iron total) by mouth 2 (two) times daily. This may cause constipation. (Patient not taking: Reported on 12/20/2015) 90 each 2   No facility-administered medications prior to visit.    Social History   Social History  . Marital Status: Married    Spouse Name: N/A  . Number of  Children: N/A  . Years of Education: N/A   Social History Main Topics  . Smoking status: Never Smoker   . Smokeless tobacco: Never Used  . Alcohol Use: No  . Drug Use: No  . Sexual Activity: Yes    Birth Control/ Protection: Condom   Other Topics Concern  . None   Social History Narrative     Review of Systems  Constitutional: Negative for fever, chills and appetite change.  HENT: Positive for congestion, postnasal drip, rhinorrhea and sinus pressure. Negative for ear pain and sore throat.   Eyes: Negative for pain and redness.  Respiratory: Negative for cough, shortness of breath and wheezing.   Cardiovascular: Negative for leg swelling.  Gastrointestinal: Negative for nausea, vomiting, abdominal pain, diarrhea, constipation and blood in stool.  Endocrine: Negative for polyuria.  Genitourinary: Negative for dysuria, urgency, frequency and flank pain.  Musculoskeletal: Negative for gait problem.  Skin: Negative for rash.  Neurological: Negative for weakness and headaches.  Psychiatric/Behavioral: Negative for confusion and decreased concentration. The patient is not nervous/anxious.     Objective:  BP 128/68 mmHg  Pulse 83  Temp(Src) 98.3 F (36.8 C) (Oral)  Resp 16  Ht 5\' 8"  (1.727 m)  Wt 237 lb (107.502 kg)  BMI 36.04 kg/m2  SpO2 98%  Physical Exam  Constitutional: He is oriented to person, place, and time. He appears well-developed and well-nourished. No distress.  HENT:  Head: Normocephalic and atraumatic.  Right Ear: External ear normal.  Left Ear: External ear normal.  Nose: Nose normal.  Eyes: Conjunctivae and EOM are normal. Pupils are equal, round, and reactive to light. No scleral icterus.  Neck: Normal range of motion. Neck supple. No tracheal deviation present.  Cardiovascular: Normal rate, regular rhythm and normal heart sounds.   Pulmonary/Chest: Effort normal. No respiratory distress. He has no wheezes. He has no rales.  Abdominal: He exhibits no  mass. There is no tenderness. There is no rebound and no guarding.  Musculoskeletal: He exhibits no edema.  Lymphadenopathy:    He has no cervical adenopathy.  Neurological: He is alert and oriented to person, place, and time. Coordination normal.  Skin: Skin is warm and dry. No rash noted.  Psychiatric: He has a normal mood and affect. His behavior is normal.      Assessment & Plan:   Duwayne Hecksaiah was seen today for follow-up.  Diagnoses and all orders for this visit:  Prediabetes -     POCT glucose (manual entry) -     POCT glycosylated hemoglobin (Hb A1C)  Obesity  I have discontinued Mr. Barbour's ferrous fumarate. I am also having him maintain his lisinopril, metFORMIN, fenofibrate, and Iron.  Meds ordered this encounter  Medications  . Ferrous Sulfate (IRON) 325 (65 Fe) MG TABS    Sig: Take by mouth.   He was advised to stop his metformin and follow up fasting in 6 months for a recheck.  He has lost 30 pounds. I encouraged his weight loss.   Appropriate red flag conditions were discussed with the patient as well as actions that should be taken.  Patient expressed his understanding.  Follow-up: Return in about 6 months (around 06/18/2016).  Carmelina DaneAnderson, Maurisa Tesmer S, MD   Results for orders placed or performed in visit on 12/20/15  POCT glucose (manual entry)  Result Value Ref Range   POC Glucose 98 70 - 99 mg/dl  POCT glycosylated hemoglobin (Hb A1C)  Result Value Ref Range   Hemoglobin A1C 6.3

## 2015-12-20 NOTE — Patient Instructions (Signed)
Obesity Obesity is defined as having too much total body fat and a body mass index (BMI) of 30 or more. BMI is an estimate of body fat and is calculated from your height and weight. BMI is typically calculated by your health care provider during regular wellness visits. Obesity happens when you consume more calories than you can burn by exercising or performing daily physical tasks. Prolonged obesity can cause major illnesses or emergencies, such as:  Stroke.  Heart disease.  Diabetes.  Cancer.  Arthritis.  High blood pressure (hypertension).  High cholesterol.  Sleep apnea.  Erectile dysfunction.  Infertility problems. CAUSES   Regularly eating unhealthy foods.  Physical inactivity.  Certain disorders, such as an underactive thyroid (hypothyroidism), Cushing's syndrome, and polycystic ovarian syndrome.  Certain medicines, such as steroids, some depression medicines, and antipsychotics.  Genetics.  Lack of sleep. DIAGNOSIS A health care provider can diagnose obesity after calculating your BMI. Obesity will be diagnosed if your BMI is 30 or higher. There are other methods of measuring obesity levels. Some other methods include measuring your skinfold thickness, your waist circumference, and comparing your hip circumference to your waist circumference. TREATMENT  A healthy treatment program includes some or all of the following:  Long-term dietary changes.  Exercise and physical activity.  Behavioral and lifestyle changes.  Medicine only under the supervision of your health care provider. Medicines may help, but only if they are used with diet and exercise programs. If your BMI is 40 or higher, your health care provider may recommend specialized surgery or programs to help with weight loss. An unhealthy treatment program includes:  Fasting.  Fad diets.  Supplements and drugs. These choices do not succeed in long-term weight control. HOME CARE  INSTRUCTIONS  Exercise and perform physical activity as directed by your health care provider. To increase physical activity, try the following:  Use stairs instead of elevators.  Park farther away from store entrances.  Garden, bike, or walk instead of watching television or using the computer.  Eat healthy, low-calorie foods and drinks on a regular basis. Eat more fruits and vegetables. Use low-calorie cookbooks or take healthy cooking classes.  Limit fast food, sweets, and processed snack foods.  Eat smaller portions.  Keep a daily journal of everything you eat. There are many free websites to help you with this. It may be helpful to measure your foods so you can determine if you are eating the correct portion sizes.  Avoid drinking alcohol. Drink more water and drinks without calories.  Take vitamins and supplements only as recommended by your health care provider.  Weight-loss support groups, registered dietitians, counselors, and stress reduction education can also be very helpful. SEEK IMMEDIATE MEDICAL CARE IF:  You have chest pain or tightness.  You have trouble breathing or feel short of breath.  You have weakness or leg numbness.  You feel confused or have trouble talking.  You have sudden changes in your vision.   This information is not intended to replace advice given to you by your health care provider. Make sure you discuss any questions you have with your health care provider.   Document Released: 01/09/2005 Document Revised: 12/23/2014 Document Reviewed: 01/08/2012 Elsevier Interactive Patient Education 2016 Elsevier Inc.  

## 2016-01-05 ENCOUNTER — Encounter: Payer: Self-pay | Admitting: Family Medicine

## 2016-01-10 ENCOUNTER — Encounter: Payer: Self-pay | Admitting: Family Medicine

## 2016-02-19 ENCOUNTER — Ambulatory Visit (INDEPENDENT_AMBULATORY_CARE_PROVIDER_SITE_OTHER): Payer: BLUE CROSS/BLUE SHIELD | Admitting: Family Medicine

## 2016-02-19 VITALS — BP 138/88 | HR 79 | Temp 97.8°F | Resp 17 | Ht 66.5 in | Wt 240.0 lb

## 2016-02-19 DIAGNOSIS — R079 Chest pain, unspecified: Secondary | ICD-10-CM

## 2016-02-19 DIAGNOSIS — R42 Dizziness and giddiness: Secondary | ICD-10-CM

## 2016-02-19 LAB — POCT CBC
Granulocyte percent: 53.2 %G (ref 37–80)
HCT, POC: 41.6 % — AB (ref 43.5–53.7)
Hemoglobin: 14.5 g/dL (ref 14.1–18.1)
Lymph, poc: 2.7 (ref 0.6–3.4)
MCH, POC: 25.3 pg — AB (ref 27–31.2)
MCHC: 34.8 g/dL (ref 31.8–35.4)
MCV: 72.8 fL — AB (ref 80–97)
MID (cbc): 0.5 (ref 0–0.9)
MPV: 8.2 fL (ref 0–99.8)
POC Granulocyte: 3.6 (ref 2–6.9)
POC LYMPH PERCENT: 39.1 %L (ref 10–50)
POC MID %: 7.7 %M (ref 0–12)
Platelet Count, POC: 267 10*3/uL (ref 142–424)
RBC: 5.71 M/uL (ref 4.69–6.13)
RDW, POC: 14.5 %
WBC: 6.8 10*3/uL (ref 4.6–10.2)

## 2016-02-19 LAB — GLUCOSE, POCT (MANUAL RESULT ENTRY): POC Glucose: 98 mg/dl (ref 70–99)

## 2016-02-19 MED ORDER — MECLIZINE HCL 12.5 MG PO TABS
12.5000 mg | ORAL_TABLET | Freq: Three times a day (TID) | ORAL | Status: DC | PRN
Start: 2016-02-19 — End: 2016-05-02

## 2016-02-19 NOTE — Progress Notes (Signed)
   Subjective:    Patient ID: Walter MalesIsaiah Dilger Jr., male    DOB: [birth year], 45 y.o.   MRN: [id number] By signing my name below, I, Littie Deedsichard Sun, attest that this documentation has been prepared under the direction and in the presence of Elvina SidleKurt Suheyla Mortellaro, MD.  Electronically Signed: Littie Deedsichard Sun, Medical Scribe. 02/19/2016. 8:19 AM.  HPI HPI Comments: Walter Malessaiah Dapper Jr. is a 45 y.o. male with a history of HTN and prediabetes who presents to the [facility] complaining of intermittent, sharp chest pain that started this morning. Patient also reports having dizziness that started yesterday, but worsened this morning. The dizziness is described as a lightheaded sensation and notes he has been staggering at work. Patient denies vomiting, diarrhea, abdominal pain, and cough. He also denies smoking. His wife was recently treated for sinusitis, but no other sick contacts. He is not taking the iron or metformin.  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  Review of Systems  Respiratory: Negative for cough.   Cardiovascular: Positive for chest pain.  Gastrointestinal: Negative for vomiting, abdominal pain and diarrhea.  Neurological: Positive for dizziness and light-headedness.       Objective:   Physical Exam CONSTITUTIONAL: Well developed/well nourished HEAD: Normocephalic/atraumatic EYES: EOM/PERRL ENMT: Mucous membranes moist, normal tympanic membranes NECK: supple no meningeal signs SPINE: entire spine nontender CV: S1/S2 noted, no murmurs/rubs/gallops noted LUNGS: Lungs are clear to auscultation bilaterally, no apparent distress ABDOMEN: soft, nontender, no rebound or guarding GU: no cva tenderness NEURO: Pt is awake/alert, moves all extremitiesx4 EXTREMITIES: pulses normal, full ROM SKIN: warm, color normal PSYCH: no  abnormalities of mood noted  EKG shows normal sinus rhythm Results for orders placed or performed in visit on 02/19/16  POCT glucose (manual entry)  Result Value Ref Range   POC Glucose 98 70 - 99 mg/dl  POCT CBC  Result Value Ref Range   WBC 6.8 4.6 - 10.2 K/uL   Lymph, poc 2.7 0.6 - 3.4   POC LYMPH PERCENT 39.1 10 - 50 %L   MID (cbc) 0.5 0 - 0.9   POC MID % 7.7 0 - 12 %M   POC Granulocyte 3.6 2 - 6.9   Granulocyte percent 53.2 37 - 80 %G   RBC 5.71 4.69 - 6.13 M/uL   Hemoglobin 14.5 14.1 - 18.1 g/dL   HCT, POC 78.241.6 (A) 95.643.5 - 53.7 %   MCV 72.8 (A) 80 - 97 fL   MCH, POC 25.3 (A) 27 - 31.2 pg   MCHC 34.8 31.8 - 35.4 g/dL   RDW, POC 21.314.5 %   Platelet Count, POC 267 142 - 424 K/uL   MPV 8.2 0 - 99.8 fL        Assessment & Plan:   This chart was scribed in my presence and reviewed by me personally.    ICD-9-CM ICD-10-CM   1. Dizziness and giddiness 780.4 R42 POCT glucose (manual entry)     POCT CBC     meclizine (ANTIVERT) 12.5 MG tablet  2. Chest pain, unspecified chest pain type 786.50 R07.9 EKG 12-Lead     Signed, Elvina SidleKurt Dayton Sherr, MD

## 2016-02-19 NOTE — Patient Instructions (Addendum)
Your symptoms are most consistent with a viral illness. I'm expecting them to clear in the next 24-48 hours, and I have prescribed medicine to lessen the severity.  Please try to rest as much as possible over the next 2-3 days.  Let me know if you're not feeling better by Wednesday.

## 2016-02-24 ENCOUNTER — Other Ambulatory Visit: Payer: Self-pay | Admitting: Physician Assistant

## 2016-04-21 ENCOUNTER — Other Ambulatory Visit: Payer: Self-pay | Admitting: Physician Assistant

## 2016-04-24 ENCOUNTER — Ambulatory Visit: Payer: BLUE CROSS/BLUE SHIELD | Admitting: Physician Assistant

## 2016-04-27 ENCOUNTER — Emergency Department (HOSPITAL_COMMUNITY)
Admission: EM | Admit: 2016-04-27 | Discharge: 2016-04-27 | Disposition: A | Discharge status: Home / Self Care | Payer: BLUE CROSS/BLUE SHIELD | Attending: Emergency Medicine | Admitting: Emergency Medicine

## 2016-04-27 ENCOUNTER — Encounter (HOSPITAL_COMMUNITY): Payer: Self-pay | Admitting: Emergency Medicine

## 2016-04-27 DIAGNOSIS — I10 Essential (primary) hypertension: Secondary | ICD-10-CM | POA: Insufficient documentation

## 2016-04-27 DIAGNOSIS — A084 Viral intestinal infection, unspecified: Secondary | ICD-10-CM | POA: Diagnosis not present

## 2016-04-27 DIAGNOSIS — K529 Noninfective gastroenteritis and colitis, unspecified: Secondary | ICD-10-CM

## 2016-04-27 DIAGNOSIS — Z79899 Other long term (current) drug therapy: Secondary | ICD-10-CM | POA: Diagnosis not present

## 2016-04-27 DIAGNOSIS — N289 Disorder of kidney and ureter, unspecified: Secondary | ICD-10-CM | POA: Diagnosis not present

## 2016-04-27 DIAGNOSIS — R55 Syncope and collapse: Secondary | ICD-10-CM | POA: Diagnosis not present

## 2016-04-27 LAB — CBC WITH DIFFERENTIAL/PLATELET
BASOS ABS: 0 10*3/uL (ref 0.0–0.1)
Basophils Relative: 0 %
EOS ABS: 0.1 10*3/uL (ref 0.0–0.7)
EOS PCT: 1 %
HCT: 45.7 % (ref 39.0–52.0)
Hemoglobin: 14.4 g/dL (ref 13.0–17.0)
LYMPHS PCT: 7 %
Lymphs Abs: 0.7 10*3/uL (ref 0.7–4.0)
MCH: 23.9 pg — ABNORMAL LOW (ref 26.0–34.0)
MCHC: 31.5 g/dL (ref 30.0–36.0)
MCV: 75.8 fL — AB (ref 78.0–100.0)
Monocytes Absolute: 1.1 10*3/uL — ABNORMAL HIGH (ref 0.1–1.0)
Monocytes Relative: 10 %
Neutro Abs: 8.7 10*3/uL — ABNORMAL HIGH (ref 1.7–7.7)
Neutrophils Relative %: 82 %
PLATELETS: 274 10*3/uL (ref 150–400)
RBC: 6.03 MIL/uL — AB (ref 4.22–5.81)
RDW: 15.3 % (ref 11.5–15.5)
WBC: 10.5 10*3/uL (ref 4.0–10.5)

## 2016-04-27 LAB — BASIC METABOLIC PANEL
ANION GAP: 8 (ref 5–15)
BUN: 20 mg/dL (ref 6–20)
CALCIUM: 9 mg/dL (ref 8.9–10.3)
CO2: 23 mmol/L (ref 22–32)
Chloride: 108 mmol/L (ref 101–111)
Creatinine, Ser: 1.25 mg/dL — ABNORMAL HIGH (ref 0.61–1.24)
Glucose, Bld: 123 mg/dL — ABNORMAL HIGH (ref 65–99)
Potassium: 5.5 mmol/L — ABNORMAL HIGH (ref 3.5–5.1)
Sodium: 139 mmol/L (ref 135–145)

## 2016-04-27 LAB — TROPONIN I

## 2016-04-27 LAB — I-STAT CG4 LACTIC ACID, ED: LACTIC ACID, VENOUS: 1.48 mmol/L (ref 0.5–2.0)

## 2016-04-27 MED ORDER — LOPERAMIDE HCL 2 MG PO CAPS
4.0000 mg | ORAL_CAPSULE | Freq: Once | ORAL | Status: AC
  Administered 2016-04-27: 4 mg via ORAL
  Filled 2016-04-27: qty 2

## 2016-04-27 MED ORDER — SODIUM CHLORIDE 0.9 % IV SOLN
1000.0000 mL | Freq: Once | INTRAVENOUS | Status: AC
  Administered 2016-04-27: 1000 mL via INTRAVENOUS

## 2016-04-27 MED ORDER — SODIUM CHLORIDE 0.9 % IV SOLN: 1000.0000 mL | INTRAVENOUS | Status: DC

## 2016-04-27 MED ORDER — ONDANSETRON HCL 4 MG/2ML IJ SOLN
4.0000 mg | Freq: Once | INTRAMUSCULAR | Status: AC
  Administered 2016-04-27: 4 mg via INTRAVENOUS
  Filled 2016-04-27: qty 2

## 2016-04-27 NOTE — ED Notes (Signed)
Brought in by EMS from home with c/o syncope.  Per EMS, pt reported that he passed out at around 0230 this morning while using the bathroom---- pt reported that he felt sudden weakness while sitting on the commode so he eased himself down on the floor then he "passed out".  Pt denies hitting head.  Pt denies any pain.  CBG was 142 and BP 142/89(lying) by EMS at the scene.  When EMS sit pt up, pt felt sudden weakness, became diaphoretic and then he passed out for the 2nd time.  Pt's BP was 90/70 on sitting.  Pt was given NS 250 ml IV bolus en route to ED.  Pt arrived to ED A/Ox4 with c/o generalized weakness.

## 2016-04-27 NOTE — ED Notes (Signed)
Bed: [id number] Expected date:  Expected time:  Means of arrival:  Comments: EMS 45yo M syncopal episode

## 2016-04-27 NOTE — ED Provider Notes (Addendum)
CSN: [id number]     Arrival date & time 04/27/16  4098 History  By signing my name below, I, Mesha Guinyard, attest that this documentation has been prepared under the direction and in the presence of Dione Booze, MD.  Electronically Signed: Arvilla Market, Medical Scribe. 04/27/2016. 4:00 AM.   Chief Complaint  Patient presents with  . Loss of Consciousness   HPI HPI Comments: Walter Reynolds. is a 45 y.o. male who presents to the Emergency Department complaining of diarrhea onset tonight. Pt reports having diarrhea 5-6 times. He states that he is having associated symptoms of nausea, diaphoresis, dizziness, and numb finger tips. He states that he has tried laying down on the bathroom floor with no relief for his symptoms. Pt reports feeling dizzy and nauseous when not on the toilet. Pt mentions shoulder and leg pain. He denies taking medication for relief. Pt denies chest tightness, emesis episode, and chest pain.  Past Medical History  Diagnosis Date  . Hernia     bi lateral hernia repair  . Hypertension   . Elevated cholesterol   . Allergy   . Sleep apnea 2014    severe   Past Surgical History  Procedure Laterality Date  . Inguinal hernia repair      times two, each side  . Kidney stone surgery    . Carpal tunnel release Right 2007  . Mass excision Left 10/28/2014    Procedure: LEFT WRIST VOLAR MASS EXCISION;  Surgeon: Dairl Ponder, MD;  Location: [facility];  Service: Orthopedics;  Laterality: Left;  . Carpal tunnel release Left 10/28/2014    Procedure: LEFT CARPAL TUNNEL RELEASE;  Surgeon: Dairl Ponder, MD;  Location: [facility];  Service: Orthopedics;  Laterality: Left;   Family History  Problem Relation Age of Onset  . Diabetes Mother   . Stroke Mother     4 strokes  . Heart attack Mother   . Heart disease Mother   . Hypertension Father   . Hypertension Sister   . Heart disease Sister     pacemaker   Social History   Substance Use Topics  . Smoking status: Never Smoker   . Smokeless tobacco: Never Used  . Alcohol Use: No    Review of Systems  Constitutional: Positive for diaphoresis.  Respiratory: Negative for chest tightness.   Cardiovascular: Negative for chest pain.  Gastrointestinal: Positive for nausea and diarrhea. Negative for vomiting.  Neurological: Positive for dizziness and numbness.   Allergies  Doxycycline and Erythromycin base  Home Medications   Prior to Admission medications   Medication Sig Start Date End Date Taking? Authorizing Provider  fenofibrate (TRICOR) 145 MG tablet TAKE 1 TABLET BY MOUTH EVERY DAY 04/25/16   Collie Siad English, PA  lisinopril (PRINIVIL,ZESTRIL) 20 MG tablet TAKE 1 TABLET (20 MG TOTAL) BY MOUTH DAILY. 10/23/15 04/21/16  Ofilia Neas, PA-C  meclizine (ANTIVERT) 12.5 MG tablet Take 1 tablet (12.5 mg total) by mouth 3 (three) times daily as needed for dizziness. 02/19/16   Elvina Sidle, MD   BP 131/75 mmHg  Pulse 88  Temp(Src) 98.5 F (36.9 C) (Oral)  Resp 14  SpO2 93% Physical Exam  Constitutional: He is oriented to person, place, and time. He appears well-developed and well-nourished. No distress.  HENT:  Head: Normocephalic and atraumatic.  Eyes: Conjunctivae and EOM are normal. Pupils are equal, round, and reactive to light.  Neck: Normal range of motion. Neck supple. No JVD present.  Cardiovascular: Normal  rate, regular rhythm and normal heart sounds.   No murmur heard. Pulmonary/Chest: Effort normal and breath sounds normal. He has no wheezes. He has no rales. He exhibits no tenderness.  Abdominal: Soft. Bowel sounds are normal. He exhibits no distension and no mass. There is no tenderness.  Musculoskeletal: Normal range of motion. He exhibits no edema.  Lymphadenopathy:    He has no cervical adenopathy.  Neurological: He is alert and oriented to person, place, and time. No cranial nerve deficit. He exhibits normal muscle tone.  Coordination normal.  Skin: Skin is warm and dry. No rash noted.  Psychiatric: He has a normal mood and affect. His behavior is normal. Judgment and thought content normal.  Nursing note and vitals reviewed.   ED Course  Procedures (including critical care time) DIAGNOSTIC STUDIES: Oxygen Saturation is 93% on RA, adequate by my interpretation.    COORDINATION OF CARE: 4:06 AM Discussed treatment plan with pt at bedside and pt agreed to plan.  Labs Review Results for orders placed or performed during the hospital encounter of 04/27/16  CBC with Differential  Result Value Ref Range   WBC 10.5 4.0 - 10.5 K/uL   RBC 6.03 (H) 4.22 - 5.81 MIL/uL   Hemoglobin 14.4 13.0 - 17.0 g/dL   HCT 16.145.7 09.639.0 - 04.552.0 %   MCV 75.8 (L) 78.0 - 100.0 fL   MCH 23.9 (L) 26.0 - 34.0 pg   MCHC 31.5 30.0 - 36.0 g/dL   RDW 40.915.3 81.111.5 - 91.415.5 %   Platelets 274 150 - 400 K/uL   Neutrophils Relative % 82 %   Neutro Abs 8.7 (H) 1.7 - 7.7 K/uL   Lymphocytes Relative 7 %   Lymphs Abs 0.7 0.7 - 4.0 K/uL   Monocytes Relative 10 %   Monocytes Absolute 1.1 (H) 0.1 - 1.0 K/uL   Eosinophils Relative 1 %   Eosinophils Absolute 0.1 0.0 - 0.7 K/uL   Basophils Relative 0 %   Basophils Absolute 0.0 0.0 - 0.1 K/uL  Troponin I  Result Value Ref Range   Troponin I <0.03 <0.031 ng/mL  Basic metabolic panel  Result Value Ref Range   Sodium 139 135 - 145 mmol/L   Potassium 5.5 (H) 3.5 - 5.1 mmol/L   Chloride 108 101 - 111 mmol/L   CO2 23 22 - 32 mmol/L   Glucose, Bld 123 (H) 65 - 99 mg/dL   BUN 20 6 - 20 mg/dL   Creatinine, Ser 7.821.25 (H) 0.61 - 1.24 mg/dL   Calcium 9.0 8.9 - 95.610.3 mg/dL   GFR calc non Af Amer >60 >60 mL/min   GFR calc Af Amer >60 >60 mL/min   Anion gap 8 5 - 15  I-Stat CG4 Lactic Acid, ED  Result Value Ref Range   Lactic Acid, Venous 1.48 0.5 - 2.0 mmol/L   I have personally reviewed and evaluated these lab results as part of my medical decision-making.   EKG Interpretation   Date/Time:   Saturday Apr 27 2016 04:00:03 EDT Ventricular Rate:  84 PR Interval:  146 QRS Duration: 91 QT Interval:  356 QTC Calculation: 421 R Axis:   57 Text Interpretation:  Sinus rhythm Normal ECG No old tracing to compare  Confirmed by [facility]  MD, Filipe Greathouse ([id number]) on 04/27/2016 4:03:41 AM        EKG Interpretation  Date/Time:  Saturday Apr 27 2016 06:14:26 EDT Ventricular Rate:  92 PR Interval:  150 QRS Duration: 91 QT Interval:  355 QTC  Calculation: 439 R Axis:   60 Text Interpretation:  Sinus rhythm Normal ECG No significant change since last tracing Confirmed by [facility]  MD, Remmington Teters ([id number]) on 04/27/2016 6:19:43 AM       MDM   Final diagnoses:  Enteritis  Vasovagal syncope  Renal insufficiency    Diarrhea with pattern most consistent with viral enteritis. Syncope which appears to be vasovagal based on associated diaphoresis and nausea. Vital signs in the ED are normal. He is given IV fluids and loperamide. Orthostatic vital signs showed no change in heart rate or blood pressure and laboratory workup was unremarkable. He is noted to have mild renal insufficiency which is unchanged from baseline. Lactic acid level is normal. He has not had any further diarrhea since arriving in the ED. Family has arrived and apparently there has been a sick contact with a similar illness. Family is concerned about the syncope and he was complaining of some mild discomfort in his chest. ECG was repeated and is normal. He is discharged with instructions to take loperamide as needed for recurrent diarrhea. Is given an work note for the next 48 hours.  I personally performed the services described in this documentation, which was scribed in my presence. The recorded information has been reviewed and is accurate.       Dione Booze, MD 04/27/16 [id number]  Dione Booze, MD 04/27/16 [phone]

## 2016-04-27 NOTE — Discharge Instructions (Signed)
Take loperamide (Imodium AD) as needed for diarrhea.  Diarrhea Diarrhea is frequent loose and watery bowel movements. It can cause you to feel weak and dehydrated. Dehydration can cause you to become tired and thirsty, have a dry mouth, and have decreased urination that often is dark yellow. Diarrhea is a sign of another problem, most often an infection that will not last long. In most cases, diarrhea typically lasts 2-3 days. However, it can last longer if it is a sign of something more serious. It is important to treat your diarrhea as directed by your caregiver to lessen or prevent future episodes of diarrhea. CAUSES  Some common causes include:  Gastrointestinal infections caused by viruses, bacteria, or parasites.  Food poisoning or food allergies.  Certain medicines, such as antibiotics, chemotherapy, and laxatives.  Artificial sweeteners and fructose.  Digestive disorders. HOME CARE INSTRUCTIONS  Ensure adequate fluid intake (hydration): Have 1 cup (8 oz) of fluid for each diarrhea episode. Avoid fluids that contain simple sugars or sports drinks, fruit juices, whole milk products, and sodas. Your urine should be clear or pale yellow if you are drinking enough fluids. Hydrate with an oral rehydration solution that you can purchase at pharmacies, retail stores, and online. You can prepare an oral rehydration solution at home by mixing the following ingredients together:   - tsp table salt.   tsp baking soda.   tsp salt substitute containing potassium chloride.  1  tablespoons sugar.  1 L (34 oz) of water.  Certain foods and beverages may increase the speed at which food moves through the gastrointestinal (GI) tract. These foods and beverages should be avoided and include:  Caffeinated and alcoholic beverages.  High-fiber foods, such as raw fruits and vegetables, nuts, seeds, and whole grain breads and cereals.  Foods and beverages sweetened with sugar alcohols, such as  xylitol, sorbitol, and mannitol.  Some foods may be well tolerated and may help thicken stool including:  Starchy foods, such as rice, toast, pasta, low-sugar cereal, oatmeal, grits, baked potatoes, crackers, and bagels.  Bananas.  Applesauce.  Add probiotic-rich foods to help increase healthy bacteria in the GI tract, such as yogurt and fermented milk products.  Wash your hands well after each diarrhea episode.  Only take over-the-counter or prescription medicines as directed by your caregiver.  Take a warm bath to relieve any burning or pain from frequent diarrhea episodes. SEEK IMMEDIATE MEDICAL CARE IF:   You are unable to keep fluids down.  You have persistent vomiting.  You have blood in your stool, or your stools are black and tarry.  You do not urinate in 6-8 hours, or there is only a small amount of very dark urine.  You have abdominal pain that increases or localizes.  You have weakness, dizziness, confusion, or light-headedness.  You have a severe headache.  Your diarrhea gets worse or does not get better.  You have a fever or persistent symptoms for more than 2-3 days.  You have a fever and your symptoms suddenly get worse. MAKE SURE YOU:   Understand these instructions.  Will watch your condition.  Will get help right away if you are not doing well or get worse.   This information is not intended to replace advice given to you by your health care provider. Make sure you discuss any questions you have with your health care provider.   Document Released: 11/22/2002 Document Revised: 12/23/2014 Document Reviewed: 08/09/2012 Elsevier Interactive Patient Education 2016 [employer].  Syncope, commonly  known as fainting, is a temporary loss of consciousness. It occurs when the blood flow to the brain is reduced. Vasovagal syncope (also called neurocardiogenic syncope) is a fainting spell in which the blood flow to the brain is reduced because of a sudden  drop in heart rate and blood pressure. Vasovagal syncope occurs when the brain and the cardiovascular system (blood vessels) do not adequately communicate and respond to each other. This is the most common cause of fainting. It often occurs in response to fear or some other type of emotional or physical stress. The body has a reaction in which the heart starts beating too slowly or the blood vessels expand, reducing blood pressure. This type of fainting spell is generally considered harmless. However, injuries can occur if a person takes a sudden fall during a fainting spell.  CAUSES  Vasovagal syncope occurs when a person's blood pressure and heart rate decrease suddenly, usually in response to a trigger. Many things and situations can trigger an episode. Some of these include:   Pain.   Fear.   The sight of blood or medical procedures, such as blood being drawn from a vein.   Common activities, such as coughing, swallowing, stretching, or going to the bathroom.   Emotional stress.   Prolonged standing, especially in a warm environment.   Lack of sleep or rest.   Prolonged lack of food.   Prolonged lack of fluids.   Recent illness.  The use of certain drugs that affect blood pressure, such as cocaine, alcohol, marijuana, inhalants, and opiates.  SYMPTOMS  Before the fainting episode, you may:   Feel dizzy or light headed.   Become pale.  Sense that you are going to faint.   Feel like the room is spinning.   Have tunnel vision, only seeing directly in front of you.   Feel sick to your stomach (nauseous).   See spots or slowly lose vision.   Hear ringing in your ears.   Have a headache.   Feel warm and sweaty.   Feel a sensation of pins and needles. During the fainting spell, you will generally be unconscious for no longer than a couple minutes before waking up and returning to normal. If you get up too quickly before your body can recover, you may  faint again. Some twitching or jerky movements may occur during the fainting spell.  DIAGNOSIS  Your health care provider will ask about your symptoms, take a medical history, and perform a physical exam. Various tests may be done to rule out other causes of fainting. These may include blood tests and tests to check the heart, such as electrocardiography, echocardiography, and possibly an electrophysiology study. When other causes have been ruled out, a test may be done to check the body's response to changes in position (tilt table test). TREATMENT  Most cases of vasovagal syncope do not require treatment. Your health care provider may recommend ways to avoid fainting triggers and may provide home strategies for preventing fainting. If you must be exposed to a possible trigger, you can drink additional fluids to help reduce your chances of having an episode of vasovagal syncope. If you have warning signs of an oncoming episode, you can respond by positioning yourself favorably (lying down). If your fainting spells continue, you may be given medicines to prevent fainting. Some medicines may help make you more resistant to repeated episodes of vasovagal syncope. Special exercises or compression stockings may be recommended. In rare cases, the surgical placement  of a pacemaker is considered. HOME CARE INSTRUCTIONS   Learn to identify the warning signs of vasovagal syncope.   Sit or lie down at the first warning sign of a fainting spell. If sitting, put your head down between your legs. If you lie down, swing your legs up in the air to increase blood flow to the brain.   Avoid hot tubs and saunas.  Avoid prolonged standing.  Drink enough fluids to keep your urine clear or pale yellow. Avoid caffeine.  Increase salt in your diet as directed by your health care provider.   If you have to stand for a long time, perform movements such as:   Crossing your legs.   Flexing and stretching your leg  muscles.   Squatting.   Moving your legs.   Bending over.   Only take over-the-counter or prescription medicines as directed by your health care provider. Do not suddenly stop any medicines without asking your health care provider first. SEEK MEDICAL CARE IF:   Your fainting spells continue or happen more frequently in spite of treatment.   You lose consciousness for more than a couple minutes.  You have fainting spells during or after exercising or after being startled.   You have new symptoms that occur with the fainting spells, such as:   Shortness of breath.  Chest pain.   Irregular heartbeat.   You have episodes of twitching or jerky movements that last longer than a few seconds.  You have episodes of twitching or jerky movements without obvious fainting. SEEK IMMEDIATE MEDICAL CARE IF:   You have injuries or bleeding after a fainting spell.   You have episodes of twitching or jerky movements that last longer than 5 minutes.   You have more than one spell of twitching or jerky movements before returning to consciousness after fainting.   This information is not intended to replace advice given to you by your health care provider. Make sure you discuss any questions you have with your health care provider.   Document Released: 11/18/2012 Document Revised: 04/18/2015 Document Reviewed: 11/18/2012 Elsevier Interactive Patient Education [employer]2016 Elsevier Inc.

## 2016-05-02 ENCOUNTER — Ambulatory Visit (INDEPENDENT_AMBULATORY_CARE_PROVIDER_SITE_OTHER): Payer: BLUE CROSS/BLUE SHIELD | Admitting: Physician Assistant

## 2016-05-02 VITALS — BP 128/76 | HR 71 | Temp 98.3°F | Resp 16 | Ht 67.0 in | Wt 240.0 lb

## 2016-05-02 DIAGNOSIS — R718 Other abnormality of red blood cells: Secondary | ICD-10-CM | POA: Diagnosis not present

## 2016-05-02 DIAGNOSIS — Z114 Encounter for screening for human immunodeficiency virus [HIV]: Secondary | ICD-10-CM

## 2016-05-02 DIAGNOSIS — I10 Essential (primary) hypertension: Secondary | ICD-10-CM | POA: Diagnosis not present

## 2016-05-02 DIAGNOSIS — R0981 Nasal congestion: Secondary | ICD-10-CM

## 2016-05-02 DIAGNOSIS — R7303 Prediabetes: Secondary | ICD-10-CM

## 2016-05-02 DIAGNOSIS — E785 Hyperlipidemia, unspecified: Secondary | ICD-10-CM | POA: Diagnosis not present

## 2016-05-02 LAB — POCT CBC
GRANULOCYTE PERCENT: 53.6 % (ref 37–80)
HEMATOCRIT: 41.6 % — AB (ref 43.5–53.7)
HEMOGLOBIN: 14.2 g/dL (ref 14.1–18.1)
Lymph, poc: 2.8 (ref 0.6–3.4)
MCH: 24.8 pg — AB (ref 27–31.2)
MCHC: 34.3 g/dL (ref 31.8–35.4)
MCV: 72.5 fL — AB (ref 80–97)
MID (CBC): 0.3 (ref 0–0.9)
MPV: 7.8 fL (ref 0–99.8)
POC GRANULOCYTE: 3.6 (ref 2–6.9)
POC LYMPH PERCENT: 42 %L (ref 10–50)
POC MID %: 4.4 % (ref 0–12)
Platelet Count, POC: 269 10*3/uL (ref 142–424)
RBC: 5.74 M/uL (ref 4.69–6.13)
RDW, POC: 14.9 %
WBC: 6.7 10*3/uL (ref 4.6–10.2)

## 2016-05-02 LAB — COMPREHENSIVE METABOLIC PANEL
ALBUMIN: 4.5 g/dL (ref 3.6–5.1)
ALT: 27 U/L (ref 9–46)
AST: 27 U/L (ref 10–40)
Alkaline Phosphatase: 54 U/L (ref 40–115)
BUN: 17 mg/dL (ref 7–25)
CALCIUM: 8.9 mg/dL (ref 8.6–10.3)
CO2: 21 mmol/L (ref 20–31)
CREATININE: 0.96 mg/dL (ref 0.60–1.35)
Chloride: 105 mmol/L (ref 98–110)
Glucose, Bld: 94 mg/dL (ref 65–99)
Potassium: 4.6 mmol/L (ref 3.5–5.3)
Sodium: 140 mmol/L (ref 135–146)
Total Bilirubin: 0.4 mg/dL (ref 0.2–1.2)
Total Protein: 7.1 g/dL (ref 6.1–8.1)

## 2016-05-02 LAB — LIPID PANEL
CHOL/HDL RATIO: 4.8 ratio (ref <=5.0)
Cholesterol: 129 mg/dL (ref 125–200)
HDL: 27 mg/dL — ABNORMAL LOW (ref >=40)
LDL CALC: 85 mg/dL (ref <130)
TRIGLYCERIDES: 83 mg/dL (ref <150)
VLDL: 17 mg/dL (ref <30)

## 2016-05-02 LAB — POCT GLYCOSYLATED HEMOGLOBIN (HGB A1C): Hemoglobin A1C: 6.3

## 2016-05-02 LAB — VITAMIN B12: VITAMIN B 12: 557 pg/mL (ref 200–1100)

## 2016-05-02 LAB — TSH: TSH: 1.43 mIU/L (ref 0.40–4.50)

## 2016-05-02 LAB — GLUCOSE, POCT (MANUAL RESULT ENTRY): POC GLUCOSE: 106 mg/dL — AB (ref 70–99)

## 2016-05-02 MED ORDER — FLUTICASONE PROPIONATE 50 MCG/ACT NA SUSP: 2.0000 | Freq: Every day | NASAL | Status: DC

## 2016-05-02 MED ORDER — METFORMIN HCL 500 MG PO TABS: 500.0000 mg | ORAL_TABLET | Freq: Two times a day (BID) | ORAL | Status: DC

## 2016-05-02 MED ORDER — IPRATROPIUM BROMIDE 0.03 % NA SOLN: 2.0000 | Freq: Two times a day (BID) | NASAL | Status: DC

## 2016-05-02 NOTE — Patient Instructions (Addendum)
Work on [employer]healthy eating and regular exercise. This will help your blood pressure and prediabetes, and can also help with the sleep apnea.  Start using BOTH nasal sprays. In about 2 weeks, STOP the ipratropium (Atrovent), but continue using the fluticasone (Flonase).  Use the CPAP EVERY NIGHT.    IF you received an x-ray today, you will receive an invoice from [facility]. Please contact [facility] at [phone] with questions or concerns regarding your invoice.   IF you received labwork today, you will receive an invoice from [facility]. Please contact [facility] at [phone] with questions or concerns regarding your invoice.   Our billing staff will not be able to assist you with questions regarding bills from these companies.  You will be contacted with the lab results as soon as they are available. The fastest way to get your results is to activate your My Chart account. Instructions are located on the last page of this paperwork. If you have not heard from us regarding the results in 2 weeks, please contact this office.

## 2016-05-02 NOTE — Progress Notes (Signed)
Patient ID: Walter Malessaiah Klaiber Jr., male     DOB: 1971/01/16, 45 y.o.    MRN: [id number]  PCP: Ennifer Harston, PA-C, new to me today. Previously followed by Dr. Patsy Lageropland.  Chief Complaint  Patient presents with  . Hospitalization Follow-up    diarrhea, sweats, loss of consciousness, was seen in ER Saturday    Subjective:    HPI  Presents for follow-up after recent visit to the ED.  On 5/13 he suddenly developed diaphoresis, nausea and diarrhea. After a syncopal episode, his wife called 911. He had another syncope witnessed by EMS. In the ED he was told his labs were normal and that he likely had a GI bug that several family members had recently experienced.  ED note and labs reviewed. Microcytosis appears chronic, but has never been evaluated that I can tell. Creatinine was up a little. Lactic acid and Troponin I were normal.  He's feeling a lot better, but hasn't returned to work yet. Wants to make sure he gets checked out, since the discharge papers from the ED said that he had renal insufficiency.  He has a chronically congested nose. He doesn't take anything for this symptom-he used to take cetirizine, but stopped. He sometimes doesn't wear the CPAP because he can't breathe through his nose.  Has been told that he is pre-diabetic. At one point he was started on metformin, but then another provider told him to stop it about 6 months ago.    Prior to Admission medications   Medication Sig Start Date End Date Taking? Authorizing Provider  fenofibrate (TRICOR) 145 MG tablet TAKE 1 TABLET BY MOUTH EVERY DAY 04/25/16  Yes Judeth CornfieldStephanie D English, PA  lisinopril (PRINIVIL,ZESTRIL) 20 MG tablet Take 20 mg by mouth every evening.   Yes Historical Provider, MD  meclizine (ANTIVERT) 12.5 MG tablet Take 1 tablet (12.5 mg total) by mouth 3 (three) times daily as needed for dizziness. Patient not taking: Reported on 04/27/2016 02/19/16   Elvina SidleKurt Lauenstein, MD     Allergies  Allergen Reactions    . Doxycycline Hives  . Erythromycin Base Hives     Patient Active Problem List   Diagnosis Date Noted  . OSA (obstructive sleep apnea) 09/06/2015  . Benign essential HTN 09/06/2015  . Obesity (BMI 30-39.9) 09/06/2015  . Prediabetes 06/13/2014  . Metabolic syndrome 06/13/2014     Family History  Problem Relation Age of Onset  . Diabetes Mother   . Stroke Mother     4 strokes  . Heart attack Mother   . Heart disease Mother   . Hypertension Father   . Hypertension Sister   . Heart disease Sister     pacemaker     Social History   Social History  . Marital Status: Married    Spouse Name: Anne NgLatoya C Reidel  . Number of Children: 2  . Years of Education: Associates   Occupational History  . Supervisor     [employer]   Social History Main Topics  . Smoking status: Never Smoker   . Smokeless tobacco: Never Used  . Alcohol Use: No  . Drug Use: No  . Sexual Activity:    Partners: Female    Pharmacist, hospitalBirth Control/ Protection: Condom   Other Topics Concern  . Not on file   Social History Narrative   Lives with his wife and their 2 children.   He has 3 sons from a previous relationship that live independently.   Formerly in the [employer].   Culinary  degree, cuts hair, also has a business doing professional cleaning; hopes to retire from [employer] after 20 years.        Review of Systems  Constitutional: Negative for activity change, appetite change, fatigue and unexpected weight change.  HENT: Negative for congestion, dental problem, ear pain, hearing loss, mouth sores, postnasal drip, rhinorrhea, sneezing, sore throat, tinnitus and trouble swallowing.   Eyes: Negative for photophobia, pain, redness and visual disturbance.  Respiratory: Negative for cough, chest tightness and shortness of breath.   Cardiovascular: Negative for chest pain, palpitations and leg swelling.  Gastrointestinal: Negative for nausea, vomiting, abdominal pain, diarrhea, constipation and blood  in stool.  Genitourinary: Negative for dysuria, urgency, frequency and hematuria.  Musculoskeletal: Negative for myalgias, arthralgias, gait problem and neck stiffness.  Skin: Negative for rash.  Neurological: Negative for dizziness, speech difficulty, weakness, light-headedness, numbness and headaches.  Hematological: Negative for adenopathy.  Psychiatric/Behavioral: Negative for confusion and sleep disturbance. The patient is not nervous/anxious.          Objective:  Physical Exam  Constitutional: He is oriented to person, place, and time. He appears well-developed and well-nourished. He is active and cooperative. No distress.  BP 128/76 mmHg  Pulse 71  Temp(Src) 98.3 F (36.8 C)  Resp 16  Ht 5\' 7"  (1.702 m)  Wt 240 lb (108.863 kg)  BMI 37.58 kg/m2  SpO2 97%  HENT:  Head: Normocephalic and atraumatic.  Right Ear: Hearing normal.  Left Ear: Hearing normal.  Eyes: Conjunctivae are normal. No scleral icterus.  Neck: Normal range of motion. Neck supple. No thyromegaly present.  Cardiovascular: Normal rate, regular rhythm and normal heart sounds.   Pulses:      Radial pulses are 2+ on the right side, and 2+ on the left side.  Pulmonary/Chest: Effort normal and breath sounds normal.  Lymphadenopathy:       Head (right side): No tonsillar, no preauricular, no posterior auricular and no occipital adenopathy present.       Head (left side): No tonsillar, no preauricular, no posterior auricular and no occipital adenopathy present.    He has no cervical adenopathy.       Right: No supraclavicular adenopathy present.       Left: No supraclavicular adenopathy present.  Neurological: He is alert and oriented to person, place, and time. No sensory deficit.  Skin: Skin is warm, dry and intact. No rash noted. No cyanosis or erythema. Nails show no clubbing.  Psychiatric: He has a normal mood and affect. His speech is normal and behavior is normal.         Results for orders placed  or performed in visit on 05/02/16  POCT glucose (manual entry)  Result Value Ref Range   POC Glucose 106 (A) 70 - 99 mg/dl  POCT CBC  Result Value Ref Range   WBC 6.7 4.6 - 10.2 K/uL   Lymph, poc 2.8 0.6 - 3.4   POC LYMPH PERCENT 42.0 10 - 50 %L   MID (cbc) 0.3 0 - 0.9   POC MID % 4.4 0 - 12 %M   POC Granulocyte 3.6 2 - 6.9   Granulocyte percent 53.6 37 - 80 %G   RBC 5.74 4.69 - 6.13 M/uL   Hemoglobin 14.2 14.1 - 18.1 g/dL   HCT, POC 16.1 (A) 09.6 - 53.7 %   MCV 72.5 (A) 80 - 97 fL   MCH, POC 24.8 (A) 27 - 31.2 pg   MCHC 34.3 31.8 - 35.4 g/dL  RDW, POC 14.9 %   Platelet Count, POC 269 142 - 424 K/uL   MPV 7.8 0 - 99.8 fL  POCT glycosylated hemoglobin (Hb A1C)  Result Value Ref Range   Hemoglobin A1C 6.3        Assessment & Plan:  1. Benign essential HTN Controlled. Continue current treatment. - POCT CBC - Comprehensive metabolic panel - TSH  2. Prediabetes Restart metformin. Healthy lifestyle changes encouraged. - POCT glucose (manual entry) - POCT glycosylated hemoglobin (Hb A1C) - Comprehensive metabolic panel - metFORMIN (GLUCOPHAGE) 500 MG tablet; Take 1 tablet (500 mg total) by mouth 2 (two) times daily with a meal.  Dispense: 180 tablet; Refill: 3  3. Hyperlipidemia Await lab results. May need statin therapy given elevated glucose/A1C. - Lipid panel  4. Nasal congestion This is preventing him from wearing the CPAP regularly. Start both Atrovent and Flonase. When symptoms are improved, stop the Atrovent and continue the Flonase. - ipratropium (ATROVENT) 0.03 % nasal spray; Place 2 sprays into both nostrils 2 (two) times daily.  Dispense: 30 mL; Refill: 0 - fluticasone (FLONASE) 50 MCG/ACT nasal spray; Place 2 sprays into both nostrils daily.  Dispense: 16 g; Refill: 12  5. Screening for HIV (human immunodeficiency virus) - HIV antibody  6. RBC microcytosis Likely not a cause for concern, but will investigate further, as it has been persistent and never  evaluated. - IBC Panel - Pathologist smear review - Vitamin B12  Return in about 3 months (around 08/02/2016) for re-evaluation of blood pressure, pre-diabetes.    Fernande Bras, PA-C Physician Assistant-Certified [facility]

## 2016-05-03 LAB — PATHOLOGIST SMEAR REVIEW

## 2016-05-03 LAB — HIV ANTIBODY (ROUTINE TESTING W REFLEX): HIV 1&2 Ab, 4th Generation: NONREACTIVE

## 2016-05-04 LAB — IRON: Iron: 76 ug/dL (ref 50–180)

## 2016-05-04 LAB — IBC PANEL
%SAT: 20 % (ref 15–60)
TIBC: 381 ug/dL (ref 250–425)
UIBC: 305 ug/dL (ref 125–400)

## 2016-05-16 ENCOUNTER — Encounter: Payer: Self-pay | Admitting: Physician Assistant

## 2016-05-16 NOTE — Telephone Encounter (Signed)
His employer has changed his recent time off from sick to personal back to sick again.  Now they are saying that they may not approve his time off from work, 04/27/2016-05/05/2016, and have told him that he needs me to complete paperwork for FMLA. The forms have reportedly been sent to this office.  The forms are due 05/20/2016.  Please contact the company to inquire about the forms so that I can complete them and return them by the deadline.  Sherri (his HR contact)  [employer] [phone]

## 2016-05-16 NOTE — Telephone Encounter (Signed)
Forms completed and returned to the FMLA/Disablity box at check out. Please print my OV 5/18 and fax back.

## 2016-05-18 ENCOUNTER — Encounter: Payer: Self-pay | Admitting: Physician Assistant

## 2016-05-21 ENCOUNTER — Telehealth: Payer: Self-pay

## 2016-05-21 NOTE — Telephone Encounter (Signed)
Payment received and records sent on 05/21/16

## 2016-05-21 NOTE — Telephone Encounter (Signed)
[facility]

## 2016-05-31 DIAGNOSIS — Z0271 Encounter for disability determination: Secondary | ICD-10-CM

## 2016-06-25 ENCOUNTER — Other Ambulatory Visit: Payer: Self-pay | Admitting: Physician Assistant

## 2016-08-02 ENCOUNTER — Other Ambulatory Visit: Payer: Self-pay | Admitting: Physician Assistant

## 2016-10-15 ENCOUNTER — Emergency Department (HOSPITAL_COMMUNITY): Payer: BLUE CROSS/BLUE SHIELD

## 2016-10-15 ENCOUNTER — Encounter (HOSPITAL_COMMUNITY): Payer: Self-pay

## 2016-10-15 ENCOUNTER — Ambulatory Visit (HOSPITAL_COMMUNITY)
Admission: EM | Admit: 2016-10-15 | Discharge: 2016-10-15 | Disposition: A | Discharge status: Nursing Home (Includes ICF) | Payer: BLUE CROSS/BLUE SHIELD | Attending: Family Medicine | Admitting: Family Medicine

## 2016-10-15 ENCOUNTER — Emergency Department (HOSPITAL_COMMUNITY)
Admission: EM | Admit: 2016-10-15 | Discharge: 2016-10-15 | Disposition: A | Discharge status: Home / Self Care | Payer: BLUE CROSS/BLUE SHIELD | Attending: Emergency Medicine | Admitting: Emergency Medicine

## 2016-10-15 ENCOUNTER — Encounter (HOSPITAL_COMMUNITY): Payer: Self-pay | Admitting: *Deleted

## 2016-10-15 DIAGNOSIS — R0789 Other chest pain: Secondary | ICD-10-CM

## 2016-10-15 DIAGNOSIS — R079 Chest pain, unspecified: Secondary | ICD-10-CM | POA: Diagnosis not present

## 2016-10-15 DIAGNOSIS — Z7984 Long term (current) use of oral hypoglycemic drugs: Secondary | ICD-10-CM | POA: Diagnosis not present

## 2016-10-15 DIAGNOSIS — I10 Essential (primary) hypertension: Secondary | ICD-10-CM | POA: Diagnosis not present

## 2016-10-15 DIAGNOSIS — Z79899 Other long term (current) drug therapy: Secondary | ICD-10-CM | POA: Diagnosis not present

## 2016-10-15 LAB — CBC
HEMATOCRIT: 45.1 % (ref 39.0–52.0)
Hemoglobin: 14.3 g/dL (ref 13.0–17.0)
MCH: 24.4 pg — ABNORMAL LOW (ref 26.0–34.0)
MCHC: 31.7 g/dL (ref 30.0–36.0)
MCV: 77.1 fL — AB (ref 78.0–100.0)
Platelets: 248 10*3/uL (ref 150–400)
RBC: 5.85 MIL/uL — ABNORMAL HIGH (ref 4.22–5.81)
RDW: 14.9 % (ref 11.5–15.5)
WBC: 7.6 10*3/uL (ref 4.0–10.5)

## 2016-10-15 LAB — BASIC METABOLIC PANEL
Anion gap: 8 (ref 5–15)
BUN: 14 mg/dL (ref 6–20)
CHLORIDE: 106 mmol/L (ref 101–111)
CO2: 26 mmol/L (ref 22–32)
CREATININE: 1 mg/dL (ref 0.61–1.24)
Calcium: 9.2 mg/dL (ref 8.9–10.3)
GFR calc Af Amer: >60 mL/min (ref >60)
GLUCOSE: 92 mg/dL (ref 65–99)
POTASSIUM: 4 mmol/L (ref 3.5–5.1)
Sodium: 140 mmol/L (ref 135–145)

## 2016-10-15 LAB — I-STAT TROPONIN, ED
Troponin i, poc: 0 ng/mL (ref 0.00–0.08)
Troponin i, poc: 0.01 ng/mL (ref 0.00–0.08)

## 2016-10-15 MED ORDER — ASPIRIN 81 MG PO CHEW
324.0000 mg | CHEWABLE_TABLET | Freq: Once | ORAL | Status: AC
  Administered 2016-10-15: 324 mg via ORAL

## 2016-10-15 MED ORDER — ASPIRIN 81 MG PO CHEW
CHEWABLE_TABLET | ORAL | Status: AC
  Filled 2016-10-15: qty 4

## 2016-10-15 NOTE — ED Notes (Signed)
Pt verbalized understanding discharge instructions and denies any further needs or questions at this time. VS stable, ambulatory and steady gait.   

## 2016-10-15 NOTE — ED Triage Notes (Addendum)
Pt reports chest pain that has been ongoing for several months that became more constant today. [facility] down here for further evaluation. Pt reports hx of prediabetes.

## 2016-10-15 NOTE — ED Notes (Signed)
Pt has had chest pain for the last week. Pt is coming in today because it got worse this morning and he was getting a little light headed today.

## 2016-10-15 NOTE — ED Provider Notes (Signed)
[facility]    CSN: [id number] Arrival date & time: 10/15/16  1515     History   Chief Complaint Chief Complaint  Patient presents with  . Chest Pain    HPI Walter Malessaiah Blatchford Jr. is a 45 y.o. male.   The history is provided by the patient.  Chest Pain  Pain location:  L chest Pain quality: pressure   Pain radiates to:  Does not radiate Pain severity:  Moderate (6/10 currently, was more intense ) Progression:  Worsening Chronicity:  Recurrent Relieved by:  Nothing Worsened by:  Nothing Ineffective treatments:  None tried Associated symptoms: no cough, no palpitations and no shortness of breath     Past Medical History:  Diagnosis Date  . Allergy   . Elevated cholesterol   . Hernia    bi lateral hernia repair  . Hypertension   . Sleep apnea 2014   severe    Patient Active Problem List   Diagnosis Date Noted  . Hyperlipidemia 05/02/2016  . RBC microcytosis 05/02/2016  . OSA (obstructive sleep apnea) 09/06/2015  . Benign essential HTN 09/06/2015  . Obesity (BMI 30-39.9) 09/06/2015  . Prediabetes 06/13/2014  . Metabolic syndrome 06/13/2014    Past Surgical History:  Procedure Laterality Date  . CARPAL TUNNEL RELEASE Right 2007  . CARPAL TUNNEL RELEASE Left 10/28/2014   Procedure: LEFT CARPAL TUNNEL RELEASE;  Surgeon: Dairl PonderMatthew Weingold, MD;  Location: [facility];  Service: Orthopedics;  Laterality: Left;  . INGUINAL HERNIA REPAIR     times two, each side  . KIDNEY STONE SURGERY    . MASS EXCISION Left 10/28/2014   Procedure: LEFT WRIST VOLAR MASS EXCISION;  Surgeon: Dairl PonderMatthew Weingold, MD;  Location: [facility];  Service: Orthopedics;  Laterality: Left;       Home Medications    Prior to Admission medications   Medication Sig Start Date End Date Taking? Authorizing Provider  fenofibrate (TRICOR) 145 MG tablet TAKE 1 TABLET BY MOUTH EVERY DAY 08/05/16   Collie SiadStephanie D English, PA  fluticasone (FLONASE) 50 MCG/ACT  nasal spray Place 2 sprays into both nostrils daily. 05/02/16   Chelle Jeffery, PA-C  ipratropium (ATROVENT) 0.03 % nasal spray Place 2 sprays into both nostrils 2 (two) times daily. 05/02/16   Chelle Jeffery, PA-C  lisinopril (PRINIVIL,ZESTRIL) 20 MG tablet TAKE 1 TABLET BY MOUTH EVERY DAY 06/27/16   Chelle Jeffery, PA-C  metFORMIN (GLUCOPHAGE) 500 MG tablet Take 1 tablet (500 mg total) by mouth 2 (two) times daily with a meal. 05/02/16   Porfirio Oarhelle Jeffery, PA-C    Family History Family History  Problem Relation Age of Onset  . Diabetes Mother   . Stroke Mother     4 strokes  . Heart attack Mother   . Heart disease Mother   . Hypertension Father   . Hypertension Sister   . Heart disease Sister     pacemaker    Social History Social History  Substance Use Topics  . Smoking status: Never Smoker  . Smokeless tobacco: Never Used  . Alcohol use No     Allergies   Doxycycline and Erythromycin base   Review of Systems Review of Systems  Constitutional: Negative.   HENT: Negative.   Respiratory: Negative.  Negative for cough, chest tightness, shortness of breath and wheezing.   Cardiovascular: Positive for chest pain. Negative for palpitations and leg swelling.  Gastrointestinal: Negative.   All other systems reviewed and are negative.    Physical Exam  Triage Vital Signs ED Triage Vitals  Enc Vitals Group     BP 10/15/16 1535 110/74     Pulse Rate 10/15/16 1535 84     Resp --      Temp 10/15/16 1535 98.1 F (36.7 C)     Temp Source 10/15/16 1535 Oral     SpO2 10/15/16 1535 96 %     Weight --      Height --      Head Circumference --      Peak Flow --      Pain Score 10/15/16 1531 6     Pain Loc --      Pain Edu? --      Excl. in GC? --    No data found.   Updated Vital Signs BP 110/74 (BP Location: Left Arm)   Pulse 84   Temp 98.1 F (36.7 C) (Oral)   SpO2 96%   Visual Acuity Right Eye Distance:   Left Eye Distance:   Bilateral Distance:    Right Eye  Near:   Left Eye Near:    Bilateral Near:     Physical Exam  Constitutional: He appears well-developed and well-nourished.  Cardiovascular: Normal rate, regular rhythm, normal heart sounds and intact distal pulses.   Pulmonary/Chest: Effort normal and breath sounds normal. He exhibits no tenderness.  Musculoskeletal: He exhibits no edema.  Neurological: He is alert.  Nursing note and vitals reviewed.    UC Treatments / Results  Labs (all labs ordered are listed, but only abnormal results are displayed) Labs Reviewed - No data to display  EKG ED ECG REPORT   Date: 10/15/2016  Rate: 81  Rhythm: normal sinus rhythm  QRS Axis: normal  Intervals: normal  ST/T Wave abnormalities: normal  Conduction Disutrbances:none  Narrative Interpretation:   Old EKG Reviewed: none available  I have personally reviewed the EKG tracing and agree with the computerized printout as noted.   Radiology No results found.  Procedures Procedures (including critical care time)  Medications Ordered in UC Medications - No data to display   Initial Impression / Assessment and Plan / UC Course  I have reviewed the triage vital signs and the nursing notes.  Pertinent labs & imaging results that were available during my care of the patient were reviewed by me and considered in my medical decision making (see chart for details).  Clinical Course    Sent for cp eval, mult risk factors.  Final Clinical Impressions(s) / UC Diagnoses   Final diagnoses:  None    New Prescriptions New Prescriptions   No medications on file     Linna HoffJames D Glenda Kunst, MD 10/15/16 1611

## 2016-10-15 NOTE — ED Notes (Signed)
Report  Phoned  To  Inetta Fermoina   Nurse  first

## 2016-10-15 NOTE — ED Triage Notes (Signed)
Pt  Reports   Pain  Off  And  On   For  sev  Weeks   Worse        Today       Pt  Had    Workup   sev   Years      Ago

## 2016-10-15 NOTE — ED Provider Notes (Signed)
[facility]   CSN: [id number] Arrival date & time: 10/15/16  1624     History   Chief Complaint Chief Complaint  Patient presents with  . Chest Pain    HPI Walter Reynolds. is a 45 y.o. male.  Patient presents with two weeks of intermittent left lateral chest pain radiating to his left shoulder and posterior/lateral left neck, worsened with lifting his arm. Came today because the pain got worse and did not resolve. Denies injuries. History of hypertension and is on metformin for pre-diabetes but is not actually a diabetic. Denies recent fever or cough. Denies shortness of breath. Pain episode not associated with nausea or diaphoresis.   The history is provided by the patient. No language interpreter was used.  Chest Pain   This is a recurrent problem. The current episode started more than 1 week ago. The problem occurs constantly. The problem has been gradually worsening. The pain is associated with raising an arm. The pain is present in the lateral region. The pain is at a severity of 5/10. The pain is moderate. The quality of the pain is described as sharp. The pain radiates to the left neck and left shoulder. Duration of episode(s) is 4 hours. The symptoms are aggravated by certain positions. Pertinent negatives include no abdominal pain, no back pain, no fever, no lower extremity edema, no nausea, no shortness of breath and no vomiting. Risk factors include male gender, obesity and lack of exercise.  His past medical history is significant for hyperlipidemia and hypertension.  Pertinent negatives for past medical history include no CAD, no CHF, no diabetes and no MI.  Pertinent negatives for family medical history include: no early MI.  Procedure history is negative for cardiac catheterization, stress echo and exercise treadmill test.    Past Medical History:  Diagnosis Date  . Allergy   . Elevated cholesterol   . Hernia    bi lateral hernia repair  .  Hypertension   . Sleep apnea 2014   severe    Patient Active Problem List   Diagnosis Date Noted  . Hyperlipidemia 05/02/2016  . RBC microcytosis 05/02/2016  . OSA (obstructive sleep apnea) 09/06/2015  . Benign essential HTN 09/06/2015  . Obesity (BMI 30-39.9) 09/06/2015  . Prediabetes 06/13/2014  . Metabolic syndrome 06/13/2014    Past Surgical History:  Procedure Laterality Date  . CARPAL TUNNEL RELEASE Right 2007  . CARPAL TUNNEL RELEASE Left 10/28/2014   Procedure: LEFT CARPAL TUNNEL RELEASE;  Surgeon: Dairl Ponder, MD;  Location: [facility];  Service: Orthopedics;  Laterality: Left;  . INGUINAL HERNIA REPAIR     times two, each side  . KIDNEY STONE SURGERY    . MASS EXCISION Left 10/28/2014   Procedure: LEFT WRIST VOLAR MASS EXCISION;  Surgeon: Dairl Ponder, MD;  Location: [facility];  Service: Orthopedics;  Laterality: Left;       Home Medications    Prior to Admission medications   Medication Sig Start Date End Date Taking? Authorizing Provider  fenofibrate (TRICOR) 145 MG tablet TAKE 1 TABLET BY MOUTH EVERY DAY 08/05/16  Yes Judeth Cornfield D English, PA  fluticasone (FLONASE) 50 MCG/ACT nasal spray Place 2 sprays into both nostrils daily. 05/02/16  Yes Chelle Jeffery, PA-C  lisinopril (PRINIVIL,ZESTRIL) 20 MG tablet TAKE 1 TABLET BY MOUTH EVERY DAY 06/27/16  Yes Chelle Jeffery, PA-C  metFORMIN (GLUCOPHAGE) 500 MG tablet Take 1 tablet (500 mg total) by mouth 2 (two) times daily with  a meal. 05/02/16  Yes Porfirio Oarhelle Jeffery, PA-C    Family History Family History  Problem Relation Age of Onset  . Diabetes Mother   . Stroke Mother     4 strokes  . Heart attack Mother   . Heart disease Mother   . Hypertension Father   . Hypertension Sister   . Heart disease Sister     pacemaker    Social History Social History  Substance Use Topics  . Smoking status: Never Smoker  . Smokeless tobacco: Never Used  . Alcohol use No      Allergies   Doxycycline and Erythromycin base   Review of Systems Review of Systems  Constitutional: Negative for fever.  HENT: Negative.   Respiratory: Negative for shortness of breath.   Cardiovascular: Positive for chest pain. Negative for leg swelling.  Gastrointestinal: Negative for abdominal pain, nausea and vomiting.  Genitourinary: Negative.   Musculoskeletal: Negative for back pain.  Skin: Negative.   Allergic/Immunologic: Negative for immunocompromised state.  Neurological: Negative.   Hematological: Does not bruise/bleed easily.  Psychiatric/Behavioral: Negative.      Physical Exam Updated Vital Signs BP 144/92   Pulse 78   Temp 98.1 F (36.7 C) (Oral)   Resp 12   SpO2 97%   Physical Exam  Constitutional: He is oriented to person, place, and time. He appears well-developed and well-nourished. No distress.  HENT:  Head: Normocephalic and atraumatic.  Eyes: Conjunctivae are normal. No scleral icterus.  Neck: Normal range of motion. Neck supple.  Cardiovascular: Normal rate, regular rhythm, normal heart sounds and intact distal pulses.  Exam reveals no gallop and no friction rub.   No murmur heard. Pulmonary/Chest: Effort normal and breath sounds normal. No respiratory distress. He has no wheezes. He has no rales. He exhibits tenderness.  Mild tenderness to left lateral chest wall  Abdominal: Soft. He exhibits no distension and no mass. There is no tenderness. There is no rebound.  Musculoskeletal: He exhibits no edema.  No peripheral edema  Neurological: He is alert and oriented to person, place, and time.  Skin: Skin is warm and dry. He is not diaphoretic.  Psychiatric: He has a normal mood and affect. His behavior is normal. Judgment and thought content normal.     ED Treatments / Results  Labs (all labs ordered are listed, but only abnormal results are displayed) Labs Reviewed  CBC - Abnormal; Notable for the following:       Result Value    RBC 5.85 (*)    MCV 77.1 (*)    MCH 24.4 (*)    All other components within normal limits  BASIC METABOLIC PANEL  I-STAT TROPOININ, ED  I-STAT TROPOININ, ED    EKG  EKG Interpretation  Date/Time:  Tuesday October 15 2016 16:31:02 EDT Ventricular Rate:  76 PR Interval:  154 QRS Duration: 90 QT Interval:  376 QTC Calculation: 423 R Axis:   7 Text Interpretation:  Normal sinus rhythm Normal ECG No significant change since last tracing Confirmed by Anitra LauthPLUNKETT  MD, Alphonzo LemmingsWHITNEY ([id number]) on 10/15/2016 8:23:31 PM       Radiology Dg Chest 2 View  Result Date: 10/15/2016 CLINICAL DATA:  Initial evaluation for acute chest pain. EXAM: CHEST  2 VIEW COMPARISON:  Prior mild cardiomegaly, stable. Mediastinal silhouette within normal limits. Radiograph 03/04/2015. FINDINGS: The cardiac and mediastinal silhouettes are stable in size and contour, and remain within normal limits. The lungs are hypoinflated. No airspace consolidation, pleural effusion, or pulmonary edema is  identified. There is no pneumothorax. No acute osseous abnormality identified. IMPRESSION: 1. No active cardiopulmonary disease. 2. Mild cardiomegaly, stable. Electronically Signed   By: Rise MuBenjamin  McClintock M.D.   On: 10/15/2016 17:33    Procedures Procedures (including critical care time)  Medications Ordered in ED Medications - No data to display   Initial Impression / Assessment and Plan / ED Course  I have reviewed the triage vital signs and the nursing notes.  Pertinent labs & imaging results that were available during my care of the patient were reviewed by me and considered in my medical decision making (see chart for details).  Clinical Course    Patient presents with atypical/musculoskeletal chest pain that has been intermittent for two weeks but worsening today. History of hypertension and pre-diabetes. He is well-appearing on presentation with an unremarkable EKG showing NSR, no signs of acute ischemic changes and  with normal ST-T segments. Chest x-ray without pneumonia, pneumothorax, cardiomegaly, pulmonary edema. He is PERC negative for Pe. Hear score of 3 indicating low risk for ACS, and troponins were negative at 0 and 3 hours. Suspect symptoms likely musculoskeletal. Given his history of hypertension and pre-diabetes, discussed with him that he may need an outpatient stress test. Instructed him to follow up with PCP to discuss medication management and possible stress testing. He was given strict return precautions for worsening symptoms. He expressed understanding and is in good condition for discharge home.  Final Clinical Impressions(s) / ED Diagnoses   Final diagnoses:  Atypical chest pain    New Prescriptions Discharge Medication List as of 10/15/2016  9:33 PM       Preston FleetingAnna Lyla Jasek, MD 10/16/16 [id number]    Gwyneth SproutWhitney Plunkett, MD 10/16/16 2314

## 2016-12-25 ENCOUNTER — Other Ambulatory Visit: Payer: Self-pay | Admitting: Physician Assistant

## 2017-01-04 LAB — HM DIABETES EYE EXAM

## 2017-01-14 LAB — HM DIABETES EYE EXAM

## 2017-01-25 ENCOUNTER — Other Ambulatory Visit: Payer: Self-pay | Admitting: Physician Assistant

## 2017-02-12 ENCOUNTER — Encounter: Payer: Self-pay | Admitting: Physician Assistant

## 2017-02-12 ENCOUNTER — Ambulatory Visit (INDEPENDENT_AMBULATORY_CARE_PROVIDER_SITE_OTHER): Payer: BLUE CROSS/BLUE SHIELD | Admitting: Physician Assistant

## 2017-02-12 ENCOUNTER — Ambulatory Visit: Payer: BLUE CROSS/BLUE SHIELD

## 2017-02-12 VITALS — BP 110/84 | HR 97 | Temp 98.4°F | Resp 16 | Ht 66.5 in | Wt 254.0 lb

## 2017-02-12 DIAGNOSIS — E785 Hyperlipidemia, unspecified: Secondary | ICD-10-CM | POA: Diagnosis not present

## 2017-02-12 DIAGNOSIS — R718 Other abnormality of red blood cells: Secondary | ICD-10-CM

## 2017-02-12 DIAGNOSIS — R202 Paresthesia of skin: Secondary | ICD-10-CM | POA: Diagnosis not present

## 2017-02-12 DIAGNOSIS — I10 Essential (primary) hypertension: Secondary | ICD-10-CM

## 2017-02-12 DIAGNOSIS — R7303 Prediabetes: Secondary | ICD-10-CM | POA: Diagnosis not present

## 2017-02-12 DIAGNOSIS — G4733 Obstructive sleep apnea (adult) (pediatric): Secondary | ICD-10-CM

## 2017-02-12 MED ORDER — LISINOPRIL 20 MG PO TABS: 20.0000 mg | ORAL_TABLET | Freq: Every day | ORAL | 3 refills | Status: DC

## 2017-02-12 NOTE — Progress Notes (Signed)
Patient ID: Walter Reynolds., male    DOB: 1971/06/06, 46 y.o.   MRN: [id number]  PCP: Porfirio Oar, PA-C  Chief Complaint  Patient presents with  . Follow-up    DIABETES with labwork    Subjective:   Presents for evaluation of diabetes lab work and ensure his blood pressure is WNL. He also brought up a new onset pain "shotting down his arms and leg" can lasts for a few days at a time. Happening for about a week. Denies trauma, exacerbation by any means. Hasn't tried anything for pain. Also complains of weird visual changes like something clouding his vision. He saw an eye doctor within the month and had a dilated exam with slit lamp and was told everything was normal. The eye doctor told him he might have dry eyes or some sort of allergy bothering his eye. He also suffers from allergies (self diagnosed) which cause him to have regular congestion and drainage. He states he hasn't tried anything for allergies.    He was seen 10/14/2016 in the ED for chest pain. He states he hasn't had a recurrence of the chest pain since then.  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  Review of Systems  HENT: Positive for rhinorrhea.   Eyes: Positive for discharge (subjective white subtance comes out of his eye) and visual disturbance (subjective cloudy intermitant visual field ).  Cardiovascular: Negative for chest pain, palpitations and leg swelling.  Musculoskeletal: Positive for myalgias.  All other systems reviewed and are negative.      Patient Active Problem List   Diagnosis Date Noted  . Hyperlipidemia 05/02/2016  . RBC microcytosis 05/02/2016  . OSA (obstructive sleep apnea) 09/06/2015  . Benign essential HTN 09/06/2015  . Obesity (BMI 30-39.9) 09/06/2015  . Prediabetes 06/13/2014  . Metabolic syndrome 06/13/2014     Prior to Admission medications   Medication Sig Start Date End Date Taking? Authorizing Provider  fluticasone (FLONASE) 50  MCG/ACT nasal spray Place 2 sprays into both nostrils daily. 05/02/16  Yes Chelle Jeffery, PA-C  lisinopril (PRINIVIL,ZESTRIL) 20 MG tablet TAKE 1 TABLET BY MOUTH EVERY DAY 01/25/17  Yes Chelle Jeffery, PA-C  metFORMIN (GLUCOPHAGE) 500 MG tablet Take 1 tablet (500 mg total) by mouth 2 (two) times daily with a meal. 05/02/16  Yes Chelle Jeffery, PA-C  fenofibrate (TRICOR) 145 MG tablet TAKE 1 TABLET BY MOUTH EVERY DAY Patient not taking: Reported on 02/12/2017 08/05/16   Collie Siad English, PA     Allergies  Allergen Reactions  . Doxycycline Hives  . Erythromycin Base Hives       Objective:  Physical Exam  Constitutional: He is oriented to person, place, and time. He appears well-developed and well-nourished. He is active.  Blood pressure 110/84, pulse 97, temperature 98.4 F (36.9 C), temperature source Oral, resp. rate 16, height 5' 6.5" (1.689 m), weight 254 lb (115.2 kg), SpO2 96 %.  HENT:  Head: Normocephalic.  Right Ear: External ear normal.  Left Ear: External ear normal.  Ears:  Nose: Mucosal edema and rhinorrhea (minimal) present. No sinus tenderness.  Mouth/Throat: Uvula is midline, oropharynx is clear and moist and mucous membranes are normal.  Bilateral cerumen makes visualizing the TM difficult bilaterally   Eyes: Conjunctivae are normal. Pupils are equal, round, and reactive to light. Right eye exhibits no discharge. Left eye exhibits no discharge. No scleral icterus.  Neck: Normal range of motion. Neck supple.  Cardiovascular: Normal rate, regular  rhythm and normal heart sounds.   Pulmonary/Chest: Effort normal and breath sounds normal.  Lymphadenopathy:    He has no cervical adenopathy.    He has no axillary adenopathy.  Neurological: He is alert and oriented to person, place, and time.  Skin: Skin is warm and dry.  Psychiatric: He has a normal mood and affect. His speech is normal and behavior is normal. Judgment and thought content normal. Cognition and memory are  normal.  Vitals reviewed.     Assessment & Plan:  1. Prediabetes - Lipid panel - Hemoglobin A1c - Comprehensive metabolic panel  2. Severe obesity (BMI >= 40) (HCC) He has gained about 15 lbs since 2016 and wants to get healthy again. We will try structured lifestyle management with Dr. Malena EdmanBeasly. - Amb Ref to [facility]  3. Paresthesia - CBC with Differential/Platelet

## 2017-02-12 NOTE — Progress Notes (Signed)
Patient ID: Walter Malessaiah Deguire Jr., male    DOB: 1971-03-07, 46 y.o.   MRN: [id number]  PCP: Porfirio Oarhelle Etienne Mowers, PA-C  Chief Complaint  Patient presents with  . Follow-up    DIABETES with labwork    Subjective:   Presents for evaluation of diabetes.  I last saw this patient 04/2016 following a syncopal episode. At that time he was re-started on metformin for pre-diabetes and advised to RTC in 3 months for re-evaluation. He was seen in the ED 09/2016 with chest pain, determined to be musculoskeletal, but given his risk factors (HTN, OSA, obesity, hyperlipidemia and pre-diabetes) was advised to follow-up here for possible referral to cardiology. He denies any recurrence of the pain.  He also mentions shooting ain in both arms and both legs x 1 week. He has not tried any sort of treatment.  Also, "weird visual changes like something is clouding" the vision. Eye exam within the past month was reportedly normal, and he was advised that he may have dry eye or allergic eye symptoms. He doesn't think it could be dry eye, since his eyes water a lot.  Review of Systems No chest pain, SOB, HA, dizziness, vision change, N/V, diarrhea, constipation, dysuria, urinary urgency or frequency, myalgias, arthralgias or rash. Runny nose and watery eyes, and episodic "cloudy" vision.     Patient Active Problem List   Diagnosis Date Noted  . Hyperlipidemia 05/02/2016  . RBC microcytosis 05/02/2016  . OSA (obstructive sleep apnea) 09/06/2015  . Benign essential HTN 09/06/2015  . Obesity (BMI 30-39.9) 09/06/2015  . Prediabetes 06/13/2014  . Metabolic syndrome 06/13/2014     Prior to Admission medications   Medication Sig Start Date End Date Taking? Authorizing Provider  fluticasone (FLONASE) 50 MCG/ACT nasal spray Place 2 sprays into both nostrils daily. 05/02/16  Yes Reylene Stauder, PA-C  lisinopril (PRINIVIL,ZESTRIL) 20 MG tablet TAKE 1 TABLET BY MOUTH EVERY DAY 01/25/17  Yes Leona Pressly, PA-C    metFORMIN (GLUCOPHAGE) 500 MG tablet Take 1 tablet (500 mg total) by mouth 2 (two) times daily with a meal. 05/02/16  Yes Redford Behrle, PA-C  fenofibrate (TRICOR) 145 MG tablet TAKE 1 TABLET BY MOUTH EVERY DAY Patient not taking: Reported on 02/12/2017 08/05/16   Collie SiadStephanie D English, PA     Allergies  Allergen Reactions  . Doxycycline Hives  . Erythromycin Base Hives       Objective:  Physical Exam  Constitutional: He is oriented to person, place, and time. He appears well-developed and well-nourished. He is active and cooperative. No distress.  BP 110/84 (BP Location: Right Arm, Patient Position: Sitting, Cuff Size: Large)   Pulse 97   Temp 98.4 F (36.9 C) (Oral)   Resp 16   Ht 5' 6.5" (1.689 m)   Wt 254 lb (115.2 kg)   SpO2 96%   BMI 40.38 kg/m   HENT:  Head: Normocephalic and atraumatic.  Right Ear: Hearing normal.  Left Ear: Hearing normal.  Eyes: Conjunctivae are normal. No scleral icterus.  Neck: Normal range of motion. Neck supple. No thyromegaly present.  Cardiovascular: Normal rate, regular rhythm and normal heart sounds.   Pulses:      Radial pulses are 2+ on the right side, and 2+ on the left side.  Pulmonary/Chest: Effort normal and breath sounds normal.  Lymphadenopathy:       Head (right side): No tonsillar, no preauricular, no posterior auricular and no occipital adenopathy present.       Head (left  side): No tonsillar, no preauricular, no posterior auricular and no occipital adenopathy present.    He has no cervical adenopathy.       Right: No supraclavicular adenopathy present.       Left: No supraclavicular adenopathy present.  Neurological: He is alert and oriented to person, place, and time. No sensory deficit.  Skin: Skin is warm, dry and intact. No rash noted. No cyanosis or erythema. Nails show no clubbing.  Psychiatric: He has a normal mood and affect. His speech is normal and behavior is normal.           Assessment & Plan:   1.  Prediabetes Await labs. Adjust regimen as indicated by results. - Lipid panel - Hemoglobin A1c - Comprehensive metabolic panel - Microalbumin, urine  2. Severe obesity (BMI >= 40) (HCC) - Amb Ref to Medical Weight Management  3. Paresthesia Uncertain cause. Update CBC. - CBC with Differential/Platelet  4. Benign essential HTN Controlled. - lisinopril (PRINIVIL,ZESTRIL) 20 MG tablet; Take 1 tablet (20 mg total) by mouth daily.  Dispense: 90 tablet; Refill: 3  5. Hyperlipidemia, unspecified hyperlipidemia type Await labs. Adjust regimen as indicated by results.  6. RBC microcytosis Update CBC.  7. OSA (obstructive sleep apnea) Was told it was mild, not requiring CPAP.  Return in about 3 months (around 05/12/2017) for re-evaluation.    Fernande Bras, PA-C Physician Assistant-Certified Primary Care at [facility]

## 2017-02-12 NOTE — Patient Instructions (Addendum)
Continue to take your medications. Follow up with Dr. Malena EdmanBeasly for lifestyle modifications.  Speak with your eye specialist about the persistent eye symptoms. If your allergies continue to bother you after you've tried OTC medications like Claritin or Allegra we will refer you to an allergist to conduct further evaluation.     IF you received an x-ray today, you will receive an invoice from [facility]. Please contact [facility] at [phone] with questions or concerns regarding your invoice.   IF you received labwork today, you will receive an invoice from [facility]. Please contact [facility] at [phone] with questions or concerns regarding your invoice.   Our billing staff will not be able to assist you with questions regarding bills from these companies.  You will be contacted with the lab results as soon as they are available. The fastest way to get your results is to activate your My Chart account. Instructions are located on the last page of this paperwork. If you have not heard from us regarding the results in 2 weeks, please contact this office.

## 2017-02-13 LAB — CBC WITH DIFFERENTIAL/PLATELET
BASOS: 0 %
Basophils Absolute: 0 10*3/uL (ref 0.0–0.2)
EOS (ABSOLUTE): 0.2 10*3/uL (ref 0.0–0.4)
Eos: 2 %
HEMOGLOBIN: 14.3 g/dL (ref 13.0–17.7)
Hematocrit: 45.4 % (ref 37.5–51.0)
IMMATURE GRANS (ABS): 0 10*3/uL (ref 0.0–0.1)
Immature Granulocytes: 0 %
LYMPHS: 37 %
Lymphocytes Absolute: 3.5 10*3/uL — ABNORMAL HIGH (ref 0.7–3.1)
MCH: 23.6 pg — AB (ref 26.6–33.0)
MCHC: 31.5 g/dL (ref 31.5–35.7)
MCV: 75 fL — AB (ref 79–97)
MONOCYTES: 12 %
Monocytes Absolute: 1.1 10*3/uL — ABNORMAL HIGH (ref 0.1–0.9)
NEUTROS ABS: 4.5 10*3/uL (ref 1.4–7.0)
Neutrophils: 49 %
Platelets: 269 10*3/uL (ref 150–379)
RBC: 6.05 x10E6/uL — ABNORMAL HIGH (ref 4.14–5.80)
RDW: 15.6 % — ABNORMAL HIGH (ref 12.3–15.4)
WBC: 9.4 10*3/uL (ref 3.4–10.8)

## 2017-02-13 LAB — LIPID PANEL
CHOLESTEROL TOTAL: 249 mg/dL — AB (ref 100–199)
Chol/HDL Ratio: 9.2 ratio units — ABNORMAL HIGH (ref 0.0–5.0)
HDL: 27 mg/dL — ABNORMAL LOW (ref >39)
TRIGLYCERIDES: 478 mg/dL — AB (ref 0–149)

## 2017-02-13 LAB — COMPREHENSIVE METABOLIC PANEL
A/G RATIO: 1.8 (ref 1.2–2.2)
ALBUMIN: 4.8 g/dL (ref 3.5–5.5)
ALT: 28 IU/L (ref 0–44)
AST: 19 IU/L (ref 0–40)
Alkaline Phosphatase: 74 IU/L (ref 39–117)
BUN / CREAT RATIO: 20 (ref 9–20)
BUN: 17 mg/dL (ref 6–24)
CALCIUM: 9.3 mg/dL (ref 8.7–10.2)
CHLORIDE: 99 mmol/L (ref 96–106)
CO2: 23 mmol/L (ref 18–29)
Creatinine, Ser: 0.85 mg/dL (ref 0.76–1.27)
GFR, EST AFRICAN AMERICAN: 120 mL/min/{1.73_m2} (ref >59)
GFR, EST NON AFRICAN AMERICAN: 104 mL/min/{1.73_m2} (ref >59)
Globulin, Total: 2.7 g/dL (ref 1.5–4.5)
Glucose: 97 mg/dL (ref 65–99)
POTASSIUM: 4.3 mmol/L (ref 3.5–5.2)
Sodium: 141 mmol/L (ref 134–144)
TOTAL PROTEIN: 7.5 g/dL (ref 6.0–8.5)

## 2017-02-13 LAB — HEMOGLOBIN A1C
ESTIMATED AVERAGE GLUCOSE: 143 mg/dL
Hgb A1c MFr Bld: 6.6 % — ABNORMAL HIGH (ref 4.8–5.6)

## 2017-02-13 LAB — MICROALBUMIN, URINE: Microalbumin, Urine: 9.5 ug/mL

## 2017-03-06 ENCOUNTER — Encounter (INDEPENDENT_AMBULATORY_CARE_PROVIDER_SITE_OTHER): Payer: BLUE CROSS/BLUE SHIELD | Admitting: Family Medicine

## 2017-04-05 ENCOUNTER — Other Ambulatory Visit: Payer: Self-pay | Admitting: Physician Assistant

## 2017-04-14 ENCOUNTER — Ambulatory Visit (INDEPENDENT_AMBULATORY_CARE_PROVIDER_SITE_OTHER): Payer: BLUE CROSS/BLUE SHIELD | Admitting: Physician Assistant

## 2017-04-14 ENCOUNTER — Encounter: Payer: Self-pay | Admitting: Physician Assistant

## 2017-04-14 VITALS — BP 132/81 | HR 95 | Temp 98.5°F | Resp 16 | Ht 66.5 in | Wt 262.2 lb

## 2017-04-14 DIAGNOSIS — D649 Anemia, unspecified: Secondary | ICD-10-CM | POA: Diagnosis not present

## 2017-04-14 DIAGNOSIS — E785 Hyperlipidemia, unspecified: Secondary | ICD-10-CM | POA: Diagnosis not present

## 2017-04-14 DIAGNOSIS — R590 Localized enlarged lymph nodes: Secondary | ICD-10-CM | POA: Diagnosis not present

## 2017-04-14 LAB — POCT CBC
GRANULOCYTE PERCENT: 52.8 % (ref 37–80)
HEMATOCRIT: 39.3 % — AB (ref 43.5–53.7)
Hemoglobin: 13 g/dL — AB (ref 14.1–18.1)
LYMPH, POC: 3.2 (ref 0.6–3.4)
MCH: 24.6 pg — AB (ref 27–31.2)
MCHC: 33.1 g/dL (ref 31.8–35.4)
MCV: 74.4 fL — AB (ref 80–97)
MID (cbc): 0.6 (ref 0–0.9)
MPV: 7.4 fL (ref 0–99.8)
POC GRANULOCYTE: 4.2 (ref 2–6.9)
POC LYMPH PERCENT: 39.7 %L (ref 10–50)
POC MID %: 7.5 % (ref 0–12)
Platelet Count, POC: 280 10*3/uL (ref 142–424)
RBC: 5.28 M/uL (ref 4.69–6.13)
RDW, POC: 15.1 %
WBC: 8 10*3/uL (ref 4.6–10.2)

## 2017-04-14 MED ORDER — CEPHALEXIN 500 MG PO CAPS: 500.0000 mg | ORAL_CAPSULE | Freq: Four times a day (QID) | ORAL | 0 refills | Status: AC

## 2017-04-14 MED ORDER — FENOFIBRATE 145 MG PO TABS: 145.0000 mg | ORAL_TABLET | Freq: Every day | ORAL | 3 refills | Status: DC

## 2017-04-14 NOTE — Assessment & Plan Note (Signed)
He was referred to [facility], but missed his appointment. Encouraged him to call and reschedule.

## 2017-04-14 NOTE — Patient Instructions (Addendum)
Today your hemoglobin is low, which is new. Please start an OTC iron supplement while we wait the results of the additional lab tests.  If the tender lump persists after treatment with the antibiotic, please come back for re-evaluation.    IF you received an x-ray today, you will receive an invoice from [facility]. Please contact [facility] at [phone] with questions or concerns regarding your invoice.   IF you received labwork today, you will receive an invoice from [place]. Please contact [facility] at [phone] with questions or concerns regarding your invoice.   Our billing staff will not be able to assist you with questions regarding bills from these companies.  You will be contacted with the lab results as soon as they are available. The fastest way to get your results is to activate your My Chart account. Instructions are located on the last page of this paperwork. If you have not heard from [place] regarding the results in 2 weeks, please contact this office.

## 2017-04-14 NOTE — Assessment & Plan Note (Signed)
Refill fenofibrate. Fasting labs in 4 more weeks, as planned.

## 2017-04-14 NOTE — Assessment & Plan Note (Signed)
Start OTC Fe supplementation while additional studies are pending.

## 2017-04-14 NOTE — Progress Notes (Signed)
Patient ID: Walter Campusano., male    DOB: January 18, 1971, 46 y.o.   MRN: [id number]  PCP: Porfirio Oar, PA-C  Chief Complaint  Patient presents with  . Ear Problem    knot on back of left ear x1 day  . Medication Refill    Fenofibrate    Subjective:   Presents for evaluation of a tender lump behind the LEFT ear that he first noticed yesterday.  Today, the pain is worse, and is associated with a headache. He has taken ibuprofen, which helped the HA, but the tenderness of the lump persists. It has not grown in size. No drainage. No injury that he can recall. No increased nasal congestion, no ear pain, no neck pain, no sore throat, no fever/chills.  He also needs a refill of fenofibrate, which he is tolerating well. Last labs drawn 02/12/2017, with plans to repeat in 12 weeks.    Review of Systems As above.    Patient Active Problem List   Diagnosis Date Noted  . Paresthesia 02/12/2017  . Hyperlipidemia 05/02/2016  . RBC microcytosis 05/02/2016  . OSA (obstructive sleep apnea) 09/06/2015  . Benign essential HTN 09/06/2015  . Severe obesity (BMI >= 40) (HCC) 09/06/2015  . Prediabetes 06/13/2014  . Metabolic syndrome 06/13/2014     Prior to Admission medications   Medication Sig Start Date End Date Taking? Authorizing Provider  fenofibrate (TRICOR) 145 MG tablet TAKE 1 TABLET BY MOUTH EVERY DAY 04/05/17  Yes Jabre Heo, PA-C  fluticasone (FLONASE) 50 MCG/ACT nasal spray Place 2 sprays into both nostrils daily. 05/02/16  Yes Albirtha Grinage, PA-C  lisinopril (PRINIVIL,ZESTRIL) 20 MG tablet Take 1 tablet (20 mg total) by mouth daily. 02/12/17  Yes Caydance Kuehnle, PA-C  metFORMIN (GLUCOPHAGE) 500 MG tablet Take 1 tablet (500 mg total) by mouth 2 (two) times daily with a meal. 05/02/16  Yes Raniya Golembeski, PA-C     Allergies  Allergen Reactions  . Doxycycline Hives  . Erythromycin Base Hives       Objective:  Physical Exam  Constitutional: He is oriented to  person, place, and time. He appears well-developed and well-nourished. He is active and cooperative. No distress.  BP 132/81   Pulse 95   Temp 98.5 F (36.9 C) (Oral)   Resp 16   Ht 5' 6.5" (1.689 m)   Wt 262 lb 3.2 oz (118.9 kg)   SpO2 96%   BMI 41.69 kg/m   HENT:  Head: Normocephalic and atraumatic.    Right Ear: Hearing, tympanic membrane, external ear and ear canal normal.  Left Ear: Hearing, tympanic membrane, external ear and ear canal normal.  Nose: Nose normal.  Mouth/Throat: Uvula is midline, oropharynx is clear and moist and mucous membranes are normal. No oral lesions. No uvula swelling.  Eyes: Conjunctivae are normal. No scleral icterus.  Neck: Normal range of motion. Neck supple. No thyromegaly present.  Cardiovascular: Normal rate, regular rhythm and normal heart sounds.   Pulses:      Radial pulses are 2+ on the right side, and 2+ on the left side.  Pulmonary/Chest: Effort normal and breath sounds normal.  Lymphadenopathy:       Head (right side): No tonsillar, no preauricular, no posterior auricular and no occipital adenopathy present.       Head (left side): No tonsillar, no preauricular, no posterior auricular and no occipital adenopathy present.    He has no cervical adenopathy.       Right: No supraclavicular  adenopathy present.       Left: No supraclavicular adenopathy present.  Neurological: He is alert and oriented to person, place, and time. No sensory deficit.  Skin: Skin is warm, dry and intact. No rash noted. No cyanosis or erythema. Nails show no clubbing.  Psychiatric: He has a normal mood and affect. His speech is normal and behavior is normal.    Results for orders placed or performed in visit on 04/14/17  POCT CBC  Result Value Ref Range   WBC 8.0 4.6 - 10.2 K/uL   Lymph, poc 3.2 0.6 - 3.4   POC LYMPH PERCENT 39.7 10 - 50 %L   MID (cbc) 0.6 0 - 0.9   POC MID % 7.5 0 - 12 %M   POC Granulocyte 4.2 2 - 6.9   Granulocyte percent 52.8 37 - 80 %G    RBC 5.28 4.69 - 6.13 M/uL   Hemoglobin 13.0 (A) 14.1 - 18.1 g/dL   HCT, POC 40.9 (A) 81.1 - 53.7 %   MCV 74.4 (A) 80 - 97 fL   MCH, POC 24.6 (A) 27 - 31.2 pg   MCHC 33.1 31.8 - 35.4 g/dL   RDW, POC 91.4 %   Platelet Count, POC 280 142 - 424 K/uL   MPV 7.4 0 - 99.8 fL       Assessment & Plan:   Problem List Items Addressed This Visit    Severe obesity (BMI >= 40) (HCC)    He was referred to [facility], but missed his appointment. Encouraged him to call and reschedule.      Hyperlipidemia    Refill fenofibrate. Fasting labs in 4 more weeks, as planned.      Relevant Medications   fenofibrate (TRICOR) 145 MG tablet   Anemia    Start OTC Fe supplementation while additional studies are pending.      Relevant Orders   Ferritin   Iron and TIBC    Other Visit Diagnoses    Lymphadenopathy, postauricular    -  Primary   Presumed post-auricular lymphadenopathy. If no better with antibiotic treatment, re-evaluate.   Relevant Medications   cephALEXin (KEFLEX) 500 MG capsule   Other Relevant Orders   POCT CBC (Completed)       Return in about 4 weeks (around 05/12/2017) for fasting labs and re-evaluation.   Fernande Bras, PA-C Primary Care at [facility]

## 2017-04-15 LAB — IRON AND TIBC
Iron Saturation: 21 % (ref 15–55)
Iron: 63 ug/dL (ref 38–169)
Total Iron Binding Capacity: 297 ug/dL (ref 250–450)
UIBC: 234 ug/dL (ref 111–343)

## 2017-04-15 LAB — FERRITIN: Ferritin: 301 ng/mL (ref 30–400)

## 2017-05-20 ENCOUNTER — Ambulatory Visit: Payer: BLUE CROSS/BLUE SHIELD | Admitting: Physician Assistant

## 2017-06-22 ENCOUNTER — Other Ambulatory Visit: Payer: Self-pay | Admitting: Physician Assistant

## 2017-06-22 DIAGNOSIS — R7303 Prediabetes: Secondary | ICD-10-CM

## 2017-06-23 NOTE — Telephone Encounter (Signed)
mychart message sent to pt about making an appt °

## 2017-06-23 NOTE — Telephone Encounter (Signed)
Please contact patient. Schedule follow-up pre-diabetes, HTN, cholesterol.  Meds ordered this encounter  Medications  . metFORMIN (GLUCOPHAGE) 500 MG tablet    Sig: TAKE 1 TABLET TWICE A DAY WITH A MEAL    Dispense:  180 tablet    Refill:  0

## 2017-08-12 ENCOUNTER — Ambulatory Visit (INDEPENDENT_AMBULATORY_CARE_PROVIDER_SITE_OTHER): Payer: BLUE CROSS/BLUE SHIELD | Admitting: Physician Assistant

## 2017-08-12 ENCOUNTER — Encounter: Payer: Self-pay | Admitting: Physician Assistant

## 2017-08-12 VITALS — BP 124/81 | HR 74 | Temp 97.9°F | Resp 18 | Ht 66.5 in | Wt 249.0 lb

## 2017-08-12 DIAGNOSIS — Z9109 Other allergy status, other than to drugs and biological substances: Secondary | ICD-10-CM

## 2017-08-12 DIAGNOSIS — I10 Essential (primary) hypertension: Secondary | ICD-10-CM | POA: Diagnosis not present

## 2017-08-12 DIAGNOSIS — M79671 Pain in right foot: Secondary | ICD-10-CM | POA: Diagnosis not present

## 2017-08-12 DIAGNOSIS — M79672 Pain in left foot: Secondary | ICD-10-CM

## 2017-08-12 DIAGNOSIS — D649 Anemia, unspecified: Secondary | ICD-10-CM

## 2017-08-12 DIAGNOSIS — E785 Hyperlipidemia, unspecified: Secondary | ICD-10-CM | POA: Diagnosis not present

## 2017-08-12 DIAGNOSIS — L600 Ingrowing nail: Secondary | ICD-10-CM | POA: Diagnosis not present

## 2017-08-12 DIAGNOSIS — R7303 Prediabetes: Secondary | ICD-10-CM | POA: Diagnosis not present

## 2017-08-12 DIAGNOSIS — Z23 Encounter for immunization: Secondary | ICD-10-CM

## 2017-08-12 DIAGNOSIS — Z6839 Body mass index (BMI) 39.0-39.9, adult: Secondary | ICD-10-CM

## 2017-08-12 MED ORDER — FLUTICASONE PROPIONATE 50 MCG/ACT NA SUSP: 2.0000 | Freq: Every day | NASAL | 12 refills | Status: DC

## 2017-08-12 MED ORDER — MELOXICAM 15 MG PO TABS: 15.0000 mg | ORAL_TABLET | Freq: Every day | ORAL | 1 refills | Status: DC

## 2017-08-12 NOTE — Progress Notes (Signed)
Patient ID: Walter Reynolds., male    DOB: [birth year], 46 y.o.   MRN: [id number]  PCP: Porfirio Oar, PA-C  Chief Complaint  Patient presents with  . 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 Pt states he has problem with ingrown toe nails.    Subjective:   Presents for evaluation of bilateral foot pain and HTN, prediabetes, cholesterol. He is accompanied by his daughter, Jerrel Ivory, who is 7 years old.  The foot pain has been intermittent for years. He has seen orthopedics and sports medicine, worn custom shoe inserts ("I have flat feet.") and tried medications. The bulk of the pain at this point is in the great toes, and he reports that they crack and pop. Pain is NOT present when he first stands up, but rather progresses over the course of the day and is worse when he stands and walks more than when he doesn't.  Recurrent ingrowing toenails, bilateral great toes. LEFT great toenail removed 03/2013 at another facility. It was a terrible experience. THe RIGHT great toenail had been previously removed elsewhere and he does not recall having problems with that one.  "Knot" growing at the base of the LEFT great toe.  Working on weight loss. Drinking protein shakes and bars, meals with lower calories, increased water intake.  Review of Systems Denies chest pain, shortness of breath, HA, dizziness, vision change, nausea, vomiting, diarrhea, constipation, melena, hematochezia, dysuria, increased urinary urgency or frequency, increased hunger or thirst, unintentional weight change, unexplained myalgias or arthralgias, rash.  Wears glasses now. Allergic eye symptoms-itching and draiange on the lashes. His daughter has similar symptoms..    Patient Active Problem List   Diagnosis Date Noted  . Anemia 04/14/2017  . Paresthesia 02/12/2017  . Hyperlipidemia 05/02/2016  . RBC microcytosis  05/02/2016  . OSA (obstructive sleep apnea) 09/06/2015  . Benign essential HTN 09/06/2015  . Severe obesity (BMI >= 40) (HCC) 09/06/2015  . Prediabetes 06/13/2014  . Metabolic syndrome 06/13/2014     Prior to Admission medications   Medication Sig Start Date End Date Taking? Authorizing Provider  fenofibrate (TRICOR) 145 MG tablet Take 1 tablet (145 mg total) by mouth daily. 04/14/17  Yes Allahna Husband, PA-C  lisinopril (PRINIVIL,ZESTRIL) 20 MG tablet Take 1 tablet (20 mg total) by mouth daily. 02/12/17  Yes Gabor Lusk, PA-C  metFORMIN (GLUCOPHAGE) 500 MG tablet TAKE 1 TABLET TWICE A DAY WITH A MEAL 06/23/17  Yes Red Mandt, PA-C  fluticasone (FLONASE) 50 MCG/ACT nasal spray Place 2 sprays into both nostrils daily. Patient not taking: Reported on 08/12/2017 05/02/16   Porfirio Oar, PA-C     Allergies  Allergen Reactions  . Doxycycline Hives  . Erythromycin Base Hives       Objective:  Physical Exam  Constitutional: He is oriented to person, place, and time. He appears well-developed and well-nourished. He is active and cooperative. No distress.  BP 124/81 (BP Location: Right Arm, Patient Position: Sitting, Cuff Size: Large)   Pulse 74   Temp 97.9 F (36.6 C) (Oral)   Resp 18   Ht 5' 6.5" (1.689 m)   Wt 249 lb (112.9 kg)   SpO2 95%   BMI 39.59 kg/m    Eyes: Conjunctivae are normal.  Pulmonary/Chest: Effort normal.  Musculoskeletal:       Right ankle: Normal. Achilles tendon normal.  Left ankle: Normal. Achilles tendon normal.       Right foot: There is tenderness, bony tenderness, crepitus (interphalangeal joint) and deformity. There is normal range of motion, no swelling, normal capillary refill and no laceration.       Left foot: There is tenderness, bony tenderness, crepitus (interphalangeal joint) and deformity. There is normal range of motion, no swelling, normal capillary refill and no laceration.  Neurological: He is alert and oriented to person,  place, and time.  Skin: Skin is warm, dry and intact.  Toenails are hyperkeratotic and trimmed very short. The nails on the great toes are trimmed below the medial and lateral nail folds, consistent with his report of trimming them due to growing in.  Psychiatric: He has a normal mood and affect. His speech is normal and behavior is normal.    Wt Readings from Last 3 Encounters:  08/12/17 249 lb (112.9 kg)  04/14/17 262 lb 3.2 oz (118.9 kg)  02/12/17 254 lb (115.2 kg)       Assessment & Plan:   Problem List Items Addressed This Visit    Prediabetes    Congratulated on lifestyle changes and weight loss thus far. Continue efforts. Await labs.      Relevant Orders   Comprehensive metabolic panel   Hemoglobin A1c   Benign essential HTN    Controlled. Continue healthy lifestyle changes. Continue lisinopril.      Relevant Orders   CBC with Differential/Platelet   Comprehensive metabolic panel   BMI 39.0-39.9,adult    Congratulated on efforts so far and encouraged continued work.      Hyperlipidemia    Anticipate improvement with recent weight loss efforts. Await labs. Adjust regimen as indicated by results.      Relevant Orders   Comprehensive metabolic panel   Lipid panel   Anemia    Await labs. Adjust regimen as indicated by results.      Relevant Orders   CBC with Differential/Platelet    Other Visit Diagnoses    Foot pain, bilateral    -  Primary   He likely has a component of plantar fasciitis, but I suspect he also has bilateral bunions. Meloxicam has worked previously.   Relevant Medications   meloxicam (MOBIC) 15 MG tablet   Other Relevant Orders   Uric Acid   Ambulatory referral to Podiatry   Ingrowing toenail       Relevant Orders   Ambulatory referral to Podiatry   Environmental allergies       Resume Flonase. If inadequate to control his symptoms, will add azelastine ns.   Relevant Medications   fluticasone (FLONASE) 50 MCG/ACT nasal spray   Need  for influenza vaccination       Relevant Orders   Flu Vaccine QUAD 36+ mos IM (Completed)   Care order/instruction:       Return in about 4 months (around 12/12/2017) for re-evalaution of prediabetes, etc..   Fernande Bras, PA-C Primary Care at [facility]

## 2017-08-12 NOTE — Assessment & Plan Note (Signed)
Congratulated on lifestyle changes and weight loss thus far. Continue efforts. Await labs.

## 2017-08-12 NOTE — Assessment & Plan Note (Signed)
Congratulated on efforts so far and encouraged continued work.

## 2017-08-12 NOTE — Assessment & Plan Note (Signed)
Await labs. Adjust regimen as indicated by results.  

## 2017-08-12 NOTE — Assessment & Plan Note (Signed)
Anticipate improvement with recent weight loss efforts. Await labs. Adjust regimen as indicated by results.

## 2017-08-12 NOTE — Patient Instructions (Addendum)
REstart the Flonase. It will likely take about 2 weeks to really work. If it's not adequate, let me know, and I'll send in azelastine nasal spray to use in addition.  Keep working on healthy eating and getting plenty of water to drink.    IF you received an x-ray today, you will receive an invoice from [facility]. Please contact [facility] at [phone] with questions or concerns regarding your invoice.   IF you received labwork today, you will receive an invoice from [facility]. Please contact [facility] at [phone] with questions or concerns regarding your invoice.   Our billing staff will not be able to assist you with questions regarding bills from these companies.  You will be contacted with the lab results as soon as they are available. The fastest way to get your results is to activate your My Chart account. Instructions are located on the last page of this paperwork. If you have not heard from [place] regarding the results in 2 weeks, please contact this office.

## 2017-08-12 NOTE — Assessment & Plan Note (Signed)
Controlled. Continue healthy lifestyle changes. Continue lisinopril.

## 2017-08-13 LAB — COMPREHENSIVE METABOLIC PANEL
ALBUMIN: 4.6 g/dL (ref 3.5–5.5)
ALK PHOS: 51 IU/L (ref 39–117)
ALT: 23 IU/L (ref 0–44)
AST: 22 IU/L (ref 0–40)
Albumin/Globulin Ratio: 1.8 (ref 1.2–2.2)
BUN / CREAT RATIO: 14 (ref 9–20)
BUN: 15 mg/dL (ref 6–24)
CHLORIDE: 104 mmol/L (ref 96–106)
CO2: 23 mmol/L (ref 20–29)
Calcium: 9.6 mg/dL (ref 8.7–10.2)
Creatinine, Ser: 1.09 mg/dL (ref 0.76–1.27)
GFR calc non Af Amer: 80 mL/min/{1.73_m2} (ref >59)
GFR, EST AFRICAN AMERICAN: 93 mL/min/{1.73_m2} (ref >59)
GLOBULIN, TOTAL: 2.6 g/dL (ref 1.5–4.5)
GLUCOSE: 89 mg/dL (ref 65–99)
Potassium: 4.7 mmol/L (ref 3.5–5.2)
SODIUM: 141 mmol/L (ref 134–144)
TOTAL PROTEIN: 7.2 g/dL (ref 6.0–8.5)

## 2017-08-13 LAB — CBC WITH DIFFERENTIAL/PLATELET
BASOS: 1 %
Basophils Absolute: 0 10*3/uL (ref 0.0–0.2)
EOS (ABSOLUTE): 0.1 10*3/uL (ref 0.0–0.4)
EOS: 2 %
HEMATOCRIT: 42.6 % (ref 37.5–51.0)
HEMOGLOBIN: 13.3 g/dL (ref 13.0–17.7)
IMMATURE GRANS (ABS): 0 10*3/uL (ref 0.0–0.1)
Immature Granulocytes: 0 %
LYMPHS ABS: 2.7 10*3/uL (ref 0.7–3.1)
LYMPHS: 40 %
MCH: 24.1 pg — AB (ref 26.6–33.0)
MCHC: 31.2 g/dL — AB (ref 31.5–35.7)
MCV: 77 fL — AB (ref 79–97)
MONOCYTES: 6 %
MONOS ABS: 0.4 10*3/uL (ref 0.1–0.9)
NEUTROS ABS: 3.4 10*3/uL (ref 1.4–7.0)
NEUTROS PCT: 51 %
Platelets: 314 10*3/uL (ref 150–379)
RBC: 5.53 x10E6/uL (ref 4.14–5.80)
RDW: 14.7 % (ref 12.3–15.4)
WBC: 6.6 10*3/uL (ref 3.4–10.8)

## 2017-08-13 LAB — LIPID PANEL
CHOL/HDL RATIO: 5.2 ratio — AB (ref 0.0–5.0)
Cholesterol, Total: 178 mg/dL (ref 100–199)
HDL: 34 mg/dL — AB (ref >39)
LDL CALC: 118 mg/dL — AB (ref 0–99)
TRIGLYCERIDES: 128 mg/dL (ref 0–149)
VLDL Cholesterol Cal: 26 mg/dL (ref 5–40)

## 2017-08-13 LAB — HEMOGLOBIN A1C
Est. average glucose Bld gHb Est-mCnc: 143 mg/dL
HEMOGLOBIN A1C: 6.6 % — AB (ref 4.8–5.6)

## 2017-08-13 LAB — URIC ACID: Uric Acid: 6.4 mg/dL (ref 3.7–8.6)

## 2017-08-22 ENCOUNTER — Encounter: Payer: Self-pay | Admitting: Family Medicine

## 2017-08-22 ENCOUNTER — Ambulatory Visit (INDEPENDENT_AMBULATORY_CARE_PROVIDER_SITE_OTHER): Payer: BLUE CROSS/BLUE SHIELD | Admitting: Family Medicine

## 2017-08-22 VITALS — BP 118/77 | HR 86 | Temp 98.0°F | Resp 16 | Ht 67.0 in | Wt 250.6 lb

## 2017-08-22 DIAGNOSIS — K429 Umbilical hernia without obstruction or gangrene: Secondary | ICD-10-CM

## 2017-08-22 DIAGNOSIS — E6609 Other obesity due to excess calories: Secondary | ICD-10-CM | POA: Diagnosis not present

## 2017-08-22 DIAGNOSIS — E119 Type 2 diabetes mellitus without complications: Secondary | ICD-10-CM

## 2017-08-22 DIAGNOSIS — R1033 Periumbilical pain: Secondary | ICD-10-CM

## 2017-08-22 DIAGNOSIS — Z6839 Body mass index (BMI) 39.0-39.9, adult: Secondary | ICD-10-CM | POA: Diagnosis not present

## 2017-08-22 DIAGNOSIS — Z23 Encounter for immunization: Secondary | ICD-10-CM

## 2017-08-22 NOTE — Progress Notes (Signed)
Chief Complaint  Patient presents with  . Navel tenderness    x 2-3 days    HPI   New diagnosis of diabetes Pt was prediabetic for years and on recent labs Walter Reynolds was shown to be diabetic Walter Reynolds reports that Walter Reynolds has been eating well and trying to increase his exercise and weight loss Lab Results  Component Value Date   HGBA1C 6.6 (H) 08/12/2017   Obesity Walter Reynolds states that Walter Reynolds has been exercising Wt Readings from Last 3 Encounters:  08/22/17 250 lb 9.6 oz (113.7 kg)  08/12/17 249 lb (112.9 kg)  04/14/17 262 lb 3.2 oz (118.9 kg)  Body mass index is 39.25 kg/m. And has lost weight with cutting back on portions Also has been making better choices Walter Reynolds states that Walter Reynolds has lost 10 pounds and hopes to lose another 20 pounds  Periumbilical Pain and Hernia Pt reports that Walter Reynolds has been having mild pain around his "navel" and Walter Reynolds reports that Walter Reynolds has normal bowel movements but still gets the pain. Worse when Walter Reynolds bends at the waist. Walter Reynolds can feel a bulge in his belly button.  No nausea, vomiting or fevers or chills.   Past Medical History:  Diagnosis Date  . Allergy   . Elevated cholesterol   . Hernia    bi lateral hernia repair  . Hypertension   . Sleep apnea 2014   severe    Current Outpatient Prescriptions  Medication Sig Dispense Refill  . fenofibrate (TRICOR) 145 MG tablet Take 1 tablet (145 mg total) by mouth daily. 90 tablet 3  . fluticasone (FLONASE) 50 MCG/ACT nasal spray Place 2 sprays into both nostrils daily. 16 g 12  . lisinopril (PRINIVIL,ZESTRIL) 20 MG tablet Take 1 tablet (20 mg total) by mouth daily. 90 tablet 3  . metFORMIN (GLUCOPHAGE) 500 MG tablet TAKE 1 TABLET TWICE A DAY WITH A MEAL 180 tablet 0  . meloxicam (MOBIC) 15 MG tablet Take 1 tablet (15 mg total) by mouth daily. (Patient not taking: Reported on 08/22/2017) 30 tablet 1   No current facility-administered medications for this visit.     Allergies:  Allergies  Allergen Reactions  . Doxycycline Hives  .  Erythromycin Base Hives    Past Surgical History:  Procedure Laterality Date  . CARPAL TUNNEL RELEASE Right 2007  . CARPAL TUNNEL RELEASE Left 10/28/2014   Procedure: LEFT CARPAL TUNNEL RELEASE;  Surgeon: Dairl PonderMatthew Weingold, MD;  Location: [facility];  Service: Orthopedics;  Laterality: Left;  . INGUINAL HERNIA REPAIR     times two, each side  . KIDNEY STONE SURGERY    . MASS EXCISION Left 10/28/2014   Procedure: LEFT WRIST VOLAR MASS EXCISION;  Surgeon: Dairl PonderMatthew Weingold, MD;  Location: [facility];  Service: Orthopedics;  Laterality: Left;    Social History   Social History  . Marital status: Married    Spouse name: Anne NgLatoya C Biegler  . Number of children: 2  . Years of education: Associates   Occupational History  . Supervisor     [employer]   Social History Main Topics  . Smoking status: Never Smoker  . Smokeless tobacco: Never Used  . Alcohol use No  . Drug use: No  . Sexual activity: Yes    Partners: Female    Birth control/ protection: Condom   Other Topics Concern  . None   Social History Narrative   Lives with his wife and their 2 children.   Walter Reynolds has 3 sons from a  previous relationship that live independently.   Formerly in the [employer].   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    ROS See hpi  Objective: Vitals:   08/22/17 1330  BP: 118/77  Pulse: 86  Resp: 16  Temp: 98 F (36.7 C)  TempSrc: Oral  SpO2: 95%  Weight: 250 lb 9.6 oz (113.7 kg)  Height:  (1.702 m)    Physical Exam  Constitutional: Walter Reynolds is oriented to person, place, and time. Walter Reynolds appears well-developed and well-nourished.  HENT:  Head: Normocephalic and atraumatic.  Cardiovascular: Normal rate, regular rhythm and normal heart sounds.   No murmur heard. Pulmonary/Chest: Effort normal and breath sounds normal. No respiratory distress. Walter Reynolds has no wheezes.  Abdominal:  Soft. Bowel sounds are normal. Walter Reynolds exhibits no distension and no mass. There is no tenderness. There is no rebound and no guarding. A hernia is present.    Neurological: Walter Reynolds is alert and oriented to person, place, and time.    Assessment and Plan Walter Reynolds was seen today for navel tenderness.  Diagnoses and all orders for this visit:  Umbilical hernia without obstruction and without gangrene -     Ambulatory referral to General Surgery  Periumbilical abdominal pain -     Ambulatory referral to General Surgery  Class 2 obesity due to excess calories without serious comorbidity with body mass index (BMI) of 39.0 to 39.9 in adult  Well controlled type 2 diabetes mellitus (HCC)  Other orders -     Pneumococcal polysaccharide vaccine 23-valent greater than or equal to 2yo subcutaneous/IM    Problem List Items Addressed This Visit      Endocrine   Well controlled type 2 diabetes mellitus (HCC)    Discussed diabetes and standard of care  Will give pneumovax today        Other   Umbilical hernia without obstruction and without gangrene - Primary    Discussed that surgical intervention has improved outcome if Walter Reynolds loses some of his abdominal girth. Discussed that Walter Reynolds should continue weight loss but to see General Surgery for evaluation. Discussed wearing an abdominal binder and bending at the knees.      Relevant Orders   Ambulatory referral to General Surgery   Periumbilical abdominal pain    Advised pt to continue having regular bowel movement      Relevant Orders   Ambulatory referral to General Surgery   Class 2 obesity due to excess calories without serious comorbidity with body mass index (BMI) of 39.0 to 39.9 in adult    Discussed weight loss to improve hernia and overall well being          Lileigh Fahringer A [employer]

## 2017-08-22 NOTE — Patient Instructions (Addendum)
IF you received an x-ray today, you will receive an invoice from [facility]. Please contact [facility] at [phone] with questions or concerns regarding your invoice.   IF you received labwork today, you will receive an invoice from [facility]. Please contact [facility] at [phone] with questions or concerns regarding your invoice.   Our billing staff will not be able to assist you with questions regarding bills from these companies.  You will be contacted with the lab results as soon as they are available. The fastest way to get your results is to activate your My Chart account. Instructions are located on the last page of this paperwork. If you have not heard from [place] regarding the results in 2 weeks, please contact this office.      Umbilical Hernia, Adult A hernia is a bulge of tissue that pushes through an opening between muscles. An umbilical hernia happens in the abdomen, near the belly button (umbilicus). The hernia may contain tissues from the small intestine, large intestine, or fatty tissue covering the intestines (omentum). Umbilical hernias in adults tend to get worse over time, and they require surgical treatment. There are several types of umbilical hernias. You may have:  A hernia located just above or below the umbilicus (indirect hernia). This is the most common type of umbilical hernia in adults.  A hernia that forms through an opening formed by the umbilicus (direct hernia).  A hernia that comes and goes (reducible hernia). A reducible hernia may be visible only when you strain, lift something heavy, or cough. This type of hernia can be pushed back into the abdomen (reduced).  A hernia that traps abdominal tissue inside the hernia (incarcerated hernia). This type of hernia cannot be reduced.  A hernia that cuts off blood flow to the tissues inside the hernia (strangulated hernia). The tissues can start to die if this happens. This type of  hernia requires emergency treatment.  What are the causes? An umbilical hernia happens when tissue inside the abdomen presses on a weak area of the abdominal muscles. What increases the risk? You may have a greater risk of this condition if you:  Are obese.  Have had several pregnancies.  Have a buildup of fluid inside your abdomen (ascites).  Have had surgery that weakens the abdominal muscles.  What are the signs or symptoms? The main symptom of this condition is a painless bulge at or near the belly button. A reducible hernia may be visible only when you strain, lift something heavy, or cough. Other symptoms may include:  Dull pain.  A feeling of pressure.  Symptoms of a strangulated hernia may include:  Pain that gets increasingly worse.  Nausea and vomiting.  Pain when pressing on the hernia.  Skin over the hernia becoming red or purple.  Constipation.  Blood in the stool.  How is this diagnosed? This condition may be diagnosed based on:  A physical exam. You may be asked to cough or strain while standing. These actions increase the pressure inside your abdomen and force the hernia through the opening in your muscles. Your health care provider may try to reduce the hernia by pressing on it.  Your symptoms and medical history.  How is this treated? Surgery is the only treatment for an umbilical hernia. Surgery for a strangulated hernia is done as soon as possible. If you have a small hernia that is not incarcerated, you may need to lose weight before having surgery. Follow these instructions  at home:  Lose weight, if told by your health care provider.  Do not try to push the hernia back in.  Watch your hernia for any changes in color or size. Tell your health care provider if any changes occur.  You may need to avoid activities that increase pressure on your hernia.  Do not lift anything that is heavier than 10 lb (4.5 kg) until your health care provider  says that this is safe.  Take over-the-counter and prescription medicines only as told by your health care provider.  Keep all follow-up visits as told by your health care provider. This is important. Contact a health care provider if:  Your hernia gets larger.  Your hernia becomes painful. Get help right away if:  You develop sudden, severe pain near the area of your hernia.  You have pain as well as nausea or vomiting.  You have pain and the skin over your hernia changes color.  You develop a fever. This information is not intended to replace advice given to you by your health care provider. Make sure you discuss any questions you have with your health care provider. Document Released: 05/03/2016 Document Revised: 08/04/2016 Document Reviewed: 05/03/2016 Elsevier Interactive Patient Education  2018 [employer].    Diabetes Mellitus and Standards of Medical Care Managing diabetes (diabetes mellitus) can be complicated. Your diabetes treatment may be managed by a team of health care providers, including:  A diet and nutrition specialist (registered dietitian).  A nurse.  A certified diabetes educator (CDE).  A diabetes specialist (endocrinologist).  An eye doctor.  A primary care provider.  A dentist.  Your health care providers follow a schedule in order to help you get the best quality of care. The following schedule is a general guideline for your diabetes management plan. Your health care providers may also give you more specific instructions. HbA1c ( hemoglobin A1c) test This test provides information about blood sugar (glucose) control over the previous 2-3 months. It is used to check whether your diabetes management plan needs to be adjusted.  If you are meeting your treatment goals, this test is done at least 2 times a year.  If you are not meeting treatment goals or if your treatment goals have changed, this test is done 4 times a year.  Blood pressure  test  This test is done at every routine medical visit. For most people, the goal is less than 130/80. Ask your health care provider what your goal blood pressure should be. Dental and eye exams  Visit your dentist two times a year.  If you have type 1 diabetes, get an eye exam 3-5 years after you are diagnosed, and then once a year after your first exam. ? If you were diagnosed with type 1 diabetes as a child, get an eye exam when you are age 34 or older and have had diabetes for 3-5 years. After the first exam, you should get an eye exam once a year.  If you have type 2 diabetes, have an eye exam as soon as you are diagnosed, and then once a year after your first exam. Foot care exam  Visual foot exams are done at every routine medical visit. The exams check for cuts, bruises, redness, blisters, sores, or other problems with the feet.  A complete foot exam is done by your health care provider once a year. This exam includes an inspection of the structure and skin of your feet, and a check of the  pulses and sensation in your feet. ? Type 1 diabetes: Get your first exam 3-5 years after diagnosis. ? Type 2 diabetes: Get your first exam as soon as you are diagnosed.  Check your feet every day for cuts, bruises, redness, blisters, or sores. If you have any of these or other problems that are not healing, contact your health care provider. Kidney function test ( urine microalbumin)  This test is done once a year. ? Type 1 diabetes: Get your first test 5 years after diagnosis. ? Type 2 diabetes: Get your first test as soon as you are diagnosed.  If you have chronic kidney disease (CKD), get a serum creatinine and estimated glomerular filtration rate (eGFR) test once a year. Lipid profile (cholesterol, HDL, LDL, triglycerides)  This test should be done when you are diagnosed with diabetes, and every 5 years after the first test. If you are on medicines to lower your cholesterol, you may need  to get this test done every year. ? The goal for LDL is less than 100 mg/dL (5.5 mmol/L). If you are at high risk, the goal is less than 70 mg/dL (3.9 mmol/L). ? The goal for HDL is 40 mg/dL (2.2 mmol/L) for men and 50 mg/dL(2.8 mmol/L) for women. An HDL cholesterol of 60 mg/dL (3.3 mmol/L) or higher gives some protection against heart disease. ? The goal for triglycerides is less than 150 mg/dL (8.3 mmol/L). Immunizations  The yearly flu (influenza) vaccine is recommended for everyone 6 months or older who has diabetes.  The pneumonia (pneumococcal) vaccine is recommended for everyone 2 years or older who has diabetes. If you are 3 or older, you may get the pneumonia vaccine as a series of two separate shots.  The hepatitis B vaccine is recommended for adults shortly after they have been diagnosed with diabetes.  The Tdap (tetanus, diphtheria, and pertussis) vaccine should be given: ? According to normal childhood vaccination schedules, for children. ? Every 10 years, for adults who have diabetes.  The shingles vaccine is recommended for people who have had chicken pox and are 50 years or older. Mental and emotional health  Screening for symptoms of eating disorders, anxiety, and depression is recommended at the time of diagnosis and afterward as needed. If your screening shows that you have symptoms (you have a positive screening result), you may need further evaluation and be referred to a mental health care provider. Diabetes self-management education  Education about how to manage your diabetes is recommended at diagnosis and ongoing as needed. Treatment plan  Your treatment plan will be reviewed at every medical visit. Summary  Managing diabetes (diabetes mellitus) can be complicated. Your diabetes treatment may be managed by a team of health care providers.  Your health care providers follow a schedule in order to help you get the best quality of care.  Standards of care  including having regular physical exams, blood tests, blood pressure monitoring, immunizations, screening tests, and education about how to manage your diabetes.  Your health care providers may also give you more specific instructions based on your individual health. This information is not intended to replace advice given to you by your health care provider. Make sure you discuss any questions you have with your health care provider. Document Released: 09/29/2009 Document Revised: 08/30/2016 Document Reviewed: 08/30/2016 Elsevier Interactive Patient Education  [employer].

## 2017-09-03 DIAGNOSIS — E1169 Type 2 diabetes mellitus with other specified complication: Secondary | ICD-10-CM | POA: Insufficient documentation

## 2017-09-03 DIAGNOSIS — Z6839 Body mass index (BMI) 39.0-39.9, adult: Secondary | ICD-10-CM | POA: Insufficient documentation

## 2017-09-03 DIAGNOSIS — K429 Umbilical hernia without obstruction or gangrene: Secondary | ICD-10-CM | POA: Insufficient documentation

## 2017-09-03 DIAGNOSIS — E119 Type 2 diabetes mellitus without complications: Secondary | ICD-10-CM | POA: Insufficient documentation

## 2017-09-03 DIAGNOSIS — R1033 Periumbilical pain: Secondary | ICD-10-CM | POA: Insufficient documentation

## 2017-09-03 HISTORY — DX: Umbilical hernia without obstruction or gangrene: K42.9

## 2017-09-03 NOTE — Assessment & Plan Note (Signed)
Discussed diabetes and standard of care  Will give pneumovax today

## 2017-09-03 NOTE — Assessment & Plan Note (Addendum)
Discussed weight loss to improve hernia and overall well being

## 2017-09-03 NOTE — Assessment & Plan Note (Signed)
Discussed that surgical intervention has improved outcome if he loses some of his abdominal girth. Discussed that he should continue weight loss but to see General Surgery for evaluation. Discussed wearing an abdominal binder and bending at the knees.

## 2017-09-03 NOTE — Assessment & Plan Note (Signed)
Advised pt to continue having regular bowel movement

## 2017-09-09 ENCOUNTER — Ambulatory Visit: Payer: BLUE CROSS/BLUE SHIELD | Admitting: Sports Medicine

## 2017-09-17 ENCOUNTER — Ambulatory Visit (INDEPENDENT_AMBULATORY_CARE_PROVIDER_SITE_OTHER): Payer: BLUE CROSS/BLUE SHIELD

## 2017-09-17 ENCOUNTER — Ambulatory Visit (INDEPENDENT_AMBULATORY_CARE_PROVIDER_SITE_OTHER): Payer: BLUE CROSS/BLUE SHIELD | Admitting: Podiatry

## 2017-09-17 ENCOUNTER — Ambulatory Visit: Payer: BLUE CROSS/BLUE SHIELD

## 2017-09-17 ENCOUNTER — Encounter: Payer: Self-pay | Admitting: Podiatry

## 2017-09-17 VITALS — BP 130/71 | HR 85 | Resp 18

## 2017-09-17 DIAGNOSIS — Q742 Other congenital malformations of lower limb(s), including pelvic girdle: Secondary | ICD-10-CM | POA: Diagnosis not present

## 2017-09-17 DIAGNOSIS — M2010 Hallux valgus (acquired), unspecified foot: Secondary | ICD-10-CM | POA: Diagnosis not present

## 2017-09-17 DIAGNOSIS — M2142 Flat foot [pes planus] (acquired), left foot: Secondary | ICD-10-CM

## 2017-09-17 DIAGNOSIS — M21619 Bunion of unspecified foot: Secondary | ICD-10-CM | POA: Diagnosis not present

## 2017-09-17 DIAGNOSIS — M2141 Flat foot [pes planus] (acquired), right foot: Secondary | ICD-10-CM

## 2017-09-17 DIAGNOSIS — L600 Ingrowing nail: Secondary | ICD-10-CM

## 2017-09-17 NOTE — Progress Notes (Addendum)
Subjective:  Patient ID: Walter Factor., male    DOB: 10-15-1971,  MRN: [id number] HPI Chief Complaint  Patient presents with  . Nail Problem    ingrown toenails   . Foot Problem    i have some issues with feet on both     46 y.o. male presents with the above complaint. Multiple complaints today, most significant complaints are ingrowing nails and painful bunions. States that he has ingrown toenails on and off for several years. Reports painful bunions to both feet, L>R. Has difficulty with certain shoegear. Patient states he is a type II diabetic recently diagnoselled. Does not know A1c. Has inserts in his shoes that he does not leave her helping him anymore as they have worn out. Denies burning throbbing. Denies numbness and tingling reports soreness in the toes.. Reports painful calluses the inside of both great toes.  Past Medical History:  Diagnosis Date  . Allergy   . Elevated cholesterol   . Hernia    bi lateral hernia repair  . Hypertension   . Sleep apnea 2014   severe   Past Surgical History:  Procedure Laterality Date  . CARPAL TUNNEL RELEASE Right 2007  . CARPAL TUNNEL RELEASE Left 10/28/2014   Procedure: LEFT CARPAL TUNNEL RELEASE;  Surgeon: Charlotte Crumb, MD;  Location: [facility];  Service: Orthopedics;  Laterality: Left;  . INGUINAL HERNIA REPAIR     times two, each side  . KIDNEY STONE SURGERY    . MASS EXCISION Left 10/28/2014   Procedure: LEFT WRIST VOLAR MASS EXCISION;  Surgeon: Charlotte Crumb, MD;  Location: [facility];  Service: Orthopedics;  Laterality: Left;    Current Outpatient Prescriptions:  .  fenofibrate (TRICOR) 145 MG tablet, Take 1 tablet (145 mg total) by mouth daily., Disp: 90 tablet, Rfl: 3 .  fluticasone (FLONASE) 50 MCG/ACT nasal spray, Place 2 sprays into both nostrils daily., Disp: 16 g, Rfl: 12 .  lisinopril (PRINIVIL,ZESTRIL) 20 MG tablet, Take 1 tablet (20 mg total) by mouth daily., Disp: 90  tablet, Rfl: 3 .  meloxicam (MOBIC) 15 MG tablet, Take 1 tablet (15 mg total) by mouth daily. (Patient not taking: Reported on 08/22/2017), Disp: 30 tablet, Rfl: 1 .  metFORMIN (GLUCOPHAGE) 500 MG tablet, TAKE 1 TABLET TWICE A DAY WITH A MEAL, Disp: 180 tablet, Rfl: 0  Allergies  Allergen Reactions  . Doxycycline Hives  . Erythromycin Base Hives   Review of Systems Objective:   Vitals:   09/17/17 1508  BP: 130/71  Pulse: 85  Resp: 18   General AA&O x3. Normal mood and affect.  Vascular Dorsalis pedis and posterior tibial pulses  present 2+ bilaterally. Capillary refill normal to all digits. Pedal hair growth normal.  Neurologic Epicritic sensation grossly intact.  Dermatologic No open lesions. Interspaces clear of maceration.  Normal skin temperature and turgor. Hyperkeratotic lesions: Hallux IPJ bilat Hallux nails ingrowing at bilateral nail borders with pain.  Orthopedic: MMT 5/5 in dorsiflexion, plantarflexion, inversion, and eversion. Hallux abductovalgus deformity present - bilat Left 1st MPJ diminished range of motion. Left 1st TMT without gross hypermobility. Right 1st MPJ diminished range of motion  Right 1st TMT without gross hypermobility. Lesser digital contractures absent bilaterally.   Radiographs: Taken and reviewed. Hallux abductovalgus deformity present. Metatarsal parabola abnormal - short 1st metatarsal, long 3rd metatarsal. Moderately increased IM (when corrected for met adductus), increased HAA and HIA. Cortical thickening 2nd/3rd metatarsals.  Assessment & Plan:  Patient was evaluated and treated  and all questions answered.  Hallux abductovalgus deformity bilaterally, left greater than right -X-rays reviewed as above -Discussed conservative vs surgical care of bunion deformity. Patient has failed conservative therapy including shoe gear modification, over-the-counter anti-inflammatory medicines. Patient wishes to proceed with surgical correction. All  risks benefits and alternatives discussed with the patient, no guarantees were given. -Will plan for surgical correction of left foot bunion first as it is more painful. Will plan for double osteotomy - Scarf vs Off-set V osteotomy with Akin osteotomy. -Formal consent signed and reviewed. Date to be determined by surgical coordinator.  Ingrown Toenails bilat great toes -Educated on proper cutting of toenails.  Pinch Calluses Bilat -Educated on proper self-care as patient presently uses a razor to cut his calluses. -No debridement performed today -Discussed underlying etiology  Pes Planus -Patient wishes to have new orthotics fabricated. Discussed holding off on orthotics at this time due to planned surgery. Will consider orthotic fabrication once surgical corrections are complete.  40 minutes of face to face time were spent with the patient. >50% of this was spent on counseling and coordination of care. Specifically discussed with patient the above diagnosis and treatment plans, including plan for surgical correction of his painful bunion deformity.  Patient to follow up postoperatively

## 2017-09-17 NOTE — Patient Instructions (Signed)
Pre-Operative Instructions  Congratulations, you have decided to take an important step towards improving your quality of life.  You can be assured that the doctors and staff at [facility] will be with you every step of the way.  Here are some important things you should know:  1. Plan to be at the surgery center/hospital at least 1 (one) hour prior to your scheduled time, unless otherwise directed by the surgical center/hospital staff.  You must have a responsible adult accompany you, remain during the surgery and drive you home.  Make sure you have directions to the surgical center/hospital to ensure you arrive on time. 2. If you are having surgery at [facility] or [facility], you will need a copy of your medical history and physical form from your family physician within one month prior to the date of surgery. We will give you a form for your primary physician to complete.  3. We make every effort to accommodate the date you request for surgery.  However, there are times where surgery dates or times have to be moved.  We will contact you as soon as possible if a change in schedule is required.   4. No aspirin/ibuprofen for one week before surgery.  If you are on aspirin, any non-steroidal anti-inflammatory medications (Mobic, Aleve, Ibuprofen) should not be taken seven (7) days prior to your surgery.  You make take Tylenol for pain prior to surgery.  5. Medications - If you are taking daily heart and blood pressure medications, seizure, reflux, allergy, asthma, anxiety, pain or diabetes medications, make sure you notify the surgery center/hospital before the day of surgery so they can tell you which medications you should take or avoid the day of surgery. 6. No food or drink after midnight the night before surgery unless directed otherwise by surgical center/hospital staff. 7. No alcoholic beverages 24-hours prior to surgery.  No smoking 24-hours prior or 24-hours after  surgery. 8. Wear loose pants or shorts. They should be loose enough to fit over bandages, boots, and casts. 9. Don't wear slip-on shoes. Sneakers are preferred. 10. Bring your boot with you to the surgery center/hospital.  Also bring crutches or a walker if your physician has prescribed it for you.  If you do not have this equipment, it will be provided for you after surgery. 11. If you have not been contacted by the surgery center/hospital by the day before your surgery, call to confirm the date and time of your surgery. 12. Leave-time from work may vary depending on the type of surgery you have.  Appropriate arrangements should be made prior to surgery with your employer. 13. Prescriptions will be provided immediately following surgery by your doctor.  Fill these as soon as possible after surgery and take the medication as directed. Pain medications will not be refilled on weekends and must be approved by the doctor. 14. Remove nail polish on the operative foot and avoid getting pedicures prior to surgery. 15. Wash the night before surgery.  The night before surgery wash the foot and leg well with water and the antibacterial soap provided. Be sure to pay special attention to beneath the toenails and in between the toes.  Wash for at least three (3) minutes. Rinse thoroughly with water and dry well with a towel.  Perform this wash unless told not to do so by your physician.  Enclosed: 1 Ice pack (please put in freezer the night before surgery)   1 Hibiclens skin cleaner     Pre-op instructions  If you have any questions regarding the instructions, please do not hesitate to call our office.  [place]: [address], [place], [place] [address] -- [phone]  [place]: [address] [place], [place] [address] -- [phone]  [place]: [address]  [place], [place] [address] -- [phone]  [place]: [address], [address], [place], [place] [address] -- [phone]  Website:  [email or web address] 

## 2017-09-24 ENCOUNTER — Other Ambulatory Visit: Payer: Self-pay | Admitting: Podiatry

## 2017-09-24 DIAGNOSIS — M2012 Hallux valgus (acquired), left foot: Secondary | ICD-10-CM | POA: Diagnosis not present

## 2017-09-24 DIAGNOSIS — Z9889 Other specified postprocedural states: Secondary | ICD-10-CM

## 2017-09-24 MED ORDER — DOCUSATE SODIUM 100 MG PO CAPS: 100.0000 mg | ORAL_CAPSULE | Freq: Two times a day (BID) | ORAL | 0 refills | Status: DC

## 2017-09-24 MED ORDER — OXYCODONE-ACETAMINOPHEN 10-325 MG PO TABS: 1.0000 | ORAL_TABLET | Freq: Three times a day (TID) | ORAL | 0 refills | Status: DC | PRN

## 2017-09-24 MED ORDER — CEPHALEXIN 500 MG PO CAPS: 500.0000 mg | ORAL_CAPSULE | Freq: Three times a day (TID) | ORAL | 0 refills | Status: DC

## 2017-09-24 MED ORDER — ONDANSETRON HCL 4 MG PO TABS: 4.0000 mg | ORAL_TABLET | Freq: Three times a day (TID) | ORAL | 0 refills | Status: DC | PRN

## 2017-09-25 ENCOUNTER — Telehealth: Payer: Self-pay | Admitting: *Deleted

## 2017-09-25 ENCOUNTER — Telehealth: Payer: Self-pay

## 2017-09-25 NOTE — Telephone Encounter (Signed)
Spoke with patient who states that he did get some relief by unwrapping the ace bandage and dangling his foot, he said the pain is easing off but still present. I advised him that he could take one Meloxicam a day to help with pain relief and continue with the prescribed Percocet as directed. He is to be up no more than 15 every hour, otherwise he is to be off his foot and elevating it. If he does not get any pain relief he is to call back for further instructions.

## 2017-09-25 NOTE — Telephone Encounter (Signed)
Pt's wife, Glee Arvin states pt had surgery on his left foot yesterday with Dr. Samuella Cota, and is complaining of a dull aching constant pain. I told Latoya to remove the surgical boot, open-ended sock, and ace wrap only, elevate the left foot for 15 minutes, but if pain worsened dangle to foot for 15 minutes this being the only time it was okay to dangle the foot, then place foot level with hip and rewrap ace beginning at toes and wrap looser as roll up the foot and leg, reapply the sock and surgery boot. Latoya states understanding.

## 2017-10-01 ENCOUNTER — Ambulatory Visit (INDEPENDENT_AMBULATORY_CARE_PROVIDER_SITE_OTHER): Payer: BLUE CROSS/BLUE SHIELD | Admitting: Podiatry

## 2017-10-01 ENCOUNTER — Other Ambulatory Visit: Payer: Self-pay | Admitting: Podiatry

## 2017-10-01 ENCOUNTER — Encounter: Payer: Self-pay | Admitting: Podiatry

## 2017-10-01 ENCOUNTER — Ambulatory Visit (INDEPENDENT_AMBULATORY_CARE_PROVIDER_SITE_OTHER): Payer: BLUE CROSS/BLUE SHIELD

## 2017-10-01 VITALS — BP 127/76 | HR 77 | Resp 18

## 2017-10-01 DIAGNOSIS — M21612 Bunion of left foot: Secondary | ICD-10-CM

## 2017-10-01 DIAGNOSIS — Z9889 Other specified postprocedural states: Secondary | ICD-10-CM

## 2017-10-01 DIAGNOSIS — M21619 Bunion of unspecified foot: Secondary | ICD-10-CM | POA: Diagnosis not present

## 2017-10-01 DIAGNOSIS — M2010 Hallux valgus (acquired), unspecified foot: Secondary | ICD-10-CM

## 2017-10-01 DIAGNOSIS — M2012 Hallux valgus (acquired), left foot: Secondary | ICD-10-CM

## 2017-10-01 NOTE — Progress Notes (Signed)
  Subjective:  Patient ID: Walter Reynolds., male    DOB: 02-10-1971,  MRN: [id number]  Chief Complaint  Patient presents with  . Routine Post Op    left foot surgery and i am doing ok    DOS: 09/24/17 PROCEDURE: L Scarf/Akin Bunionectomy  46 y.o. male returns for the above complaint. Patient states he is doing well postsurgery. States that he has not needed to take his pain medication. States that he did not take his pain medication while the block was still in effect as directed and had extreme pain when the block wear off. States his pain is now controlled without need to take pain medication. Denies nausea vomiting fever chills.   Objective:   Vitals:   10/01/17 0841  BP: 127/76  Pulse: 77  Resp: 18   General AA&O x3. Normal mood and affect.  Vascular Foot warm and well perfused.  Neurologic Gross sensation intact. Hypoesthesia overling incision. Sensation intact distally at the toe.  Dermatologic Skin well healed without signs of infection.  Orthopedic: Tenderness to palpation noted about the surgical site. HAV deformity reduced. No medial bunion.   Radiographs: Taken and reviewed. Bunionectomy noted with reduction of IM, HAV angles noted. Hardware intact without loosening or displacement. Assessment & Plan:  Patient was evaluated and treated and all questions answered.  S/p Scarf/Akin Bunionectomy 09/24/17 -XRs taken and reviewed as above. -Incision healing well. Sutures left intact. -Foot redressed.  Follow-up in 1 week for suture removal.

## 2017-10-08 ENCOUNTER — Ambulatory Visit (INDEPENDENT_AMBULATORY_CARE_PROVIDER_SITE_OTHER): Payer: BLUE CROSS/BLUE SHIELD | Admitting: Podiatry

## 2017-10-08 DIAGNOSIS — Z9889 Other specified postprocedural states: Secondary | ICD-10-CM

## 2017-10-08 NOTE — Progress Notes (Signed)
  Subjective:  Patient ID: Vicente MalesIsaiah Mcniel Jr., male    DOB: 05-08-1971,  MRN: [id number]  Chief Complaint  Patient presents with  . Routine Post Op    suture removal   DOS: 09/24/17 PROCEDURE: L Scarf/Akin Bunionectomy  46 y.o. male returns for post-op follow-up. Pain controlled. Denies post-operative issues. Having some issues with a URI but denies N/V/F/Ch.  Objective:   There were no vitals filed for this visit. General AA&O x3. Normal mood and affect.  Vascular Foot warm and well perfused.  Neurologic Gross sensation intact.   Dermatologic Skin well healed.  Orthopedic: Mild tenderness to palpation noted about the surgical site. HAV deformity reduced. No medial bunion.   Assessment & Plan:  Patient was evaluated and treated and all questions answered.  S/p Scarf/Akin Bunionectomy 09/24/17 -Sutures removed. Steris applied. Educated it is ok to shower but not soak. -Dispense compression ankle sleeve for edema control.  Follow-up in 2 weeks for XRs and likely progression to surgical shoe.

## 2017-10-13 ENCOUNTER — Telehealth: Payer: Self-pay | Admitting: Podiatry

## 2017-10-13 NOTE — Telephone Encounter (Signed)
This patient called to the cell phone at 12:57 AM on Sunday morning.  I was asleep and received a call at approximately 5 in the morning.  I then sent a text to this patient to tell him I am now awake and he should call me now.  He texted  back at 8:27 AM on Sunday morning and I talked with him by phone. This patient had surgery performed on bunion by Dr. Samuella CotaPrice in October.  He says he was seen twice in the office by Dr. Samuella CotaPrice.  He called the office this morning stating that there was pus under bandages at his surgical site.  He says one end of the incision had healed well but the other had gapped and now he has pus and presumably. Steri-Strips at the opening.  He says he now has pus under the straps, but no evidence of any pain is noted. I proceeded to tell him to remove the Steri-Strips and to soak his foot in soapy soaks in water or peroxide his foot.  He also  says that Dr. Samuella CotaPrice had given him cephalexin to take by mouth. When he was in the office.  I reiterated the importance of taking the antibiotics through this weekend.  If he has any future concerns. He should call the office on Monday and make an appointment to be seen.    Helane GuntherGregory Savva Beamer DPM

## 2017-10-16 ENCOUNTER — Ambulatory Visit (INDEPENDENT_AMBULATORY_CARE_PROVIDER_SITE_OTHER): Payer: BLUE CROSS/BLUE SHIELD | Admitting: Podiatry

## 2017-10-16 DIAGNOSIS — Z9889 Other specified postprocedural states: Secondary | ICD-10-CM

## 2017-10-16 DIAGNOSIS — L03119 Cellulitis of unspecified part of limb: Secondary | ICD-10-CM

## 2017-10-16 DIAGNOSIS — L02619 Cutaneous abscess of unspecified foot: Secondary | ICD-10-CM

## 2017-10-16 MED ORDER — CEPHALEXIN 500 MG PO CAPS: 500.0000 mg | ORAL_CAPSULE | Freq: Three times a day (TID) | ORAL | 0 refills | Status: DC

## 2017-10-16 NOTE — Progress Notes (Signed)
  Subjective:  Patient ID: Walter MalesIsaiah Wrage Jr., male    DOB: 08-Apr-1971,  MRN: [id number]  Chief Complaint  Patient presents with  . Wound Check    S/P Scarf/Akin Bunionectomy 09/24/17 LEFT - swelling and drainage the last few days at incision   46 y.o. male returns for the above complaint.  Had sutures removed at last visit. Reports drainage from his incision states that there was an odor from the fluid.  Denies nausea vomiting fever chills.  Objective:  There were no vitals filed for this visit.  General AA&O x3. Normal mood and affect.  Vascular Pedal pulses palpable.  Neurologic Epicritic sensation grossly intact.  Dermatologic Skin incision with small central dehiscence with mild fibrotic base.  Mild erythema.  No active drainage.  Orthopedic: No pain to palpation either foot.   Assessment & Plan:  Patient was evaluated and treated and all questions answered.  Status post scarf Akin bunionectomy 09/24/2017 -Small central dehiscence noted.  Painted with Betadine and dry sterile dressing.  Directed to apply dry dressing daily. -Refill Keflex for 7 days duration -Follow up in 1 week for wound check.  We will get x-rays at next visit  Return in about 1 week (around 10/23/2017) for Post-op.

## 2017-10-17 ENCOUNTER — Ambulatory Visit: Payer: Self-pay | Admitting: Surgery

## 2017-10-22 ENCOUNTER — Encounter: Payer: BLUE CROSS/BLUE SHIELD | Admitting: Podiatry

## 2017-10-23 ENCOUNTER — Encounter: Payer: Self-pay | Admitting: Podiatry

## 2017-10-23 ENCOUNTER — Ambulatory Visit (INDEPENDENT_AMBULATORY_CARE_PROVIDER_SITE_OTHER): Payer: BLUE CROSS/BLUE SHIELD

## 2017-10-23 ENCOUNTER — Ambulatory Visit (INDEPENDENT_AMBULATORY_CARE_PROVIDER_SITE_OTHER): Payer: BLUE CROSS/BLUE SHIELD | Admitting: Podiatry

## 2017-10-23 DIAGNOSIS — M21612 Bunion of left foot: Secondary | ICD-10-CM | POA: Diagnosis not present

## 2017-10-23 DIAGNOSIS — L600 Ingrowing nail: Secondary | ICD-10-CM

## 2017-10-23 DIAGNOSIS — M2012 Hallux valgus (acquired), left foot: Secondary | ICD-10-CM

## 2017-10-23 DIAGNOSIS — Z9889 Other specified postprocedural states: Secondary | ICD-10-CM

## 2017-10-24 ENCOUNTER — Encounter (HOSPITAL_COMMUNITY): Payer: Self-pay

## 2017-10-24 NOTE — Progress Notes (Signed)
Spoke to patient on phone and gave instructions for morning of surgery. Told patient he may take zofran and oxycodone if needed the morning of surgery. Also discussed NPO status night before surgery. Gave patient phone number to call if he had any questions.

## 2017-10-27 ENCOUNTER — Ambulatory Visit (HOSPITAL_COMMUNITY)
Admission: RE | Admit: 2017-10-27 | Discharge: 2017-10-27 | Disposition: A | Discharge status: Home / Self Care | Payer: BLUE CROSS/BLUE SHIELD | Source: Ambulatory Visit | Attending: Surgery | Admitting: Surgery

## 2017-10-27 ENCOUNTER — Encounter (HOSPITAL_COMMUNITY): Payer: Self-pay | Admitting: Urology

## 2017-10-27 ENCOUNTER — Ambulatory Visit (HOSPITAL_COMMUNITY): Payer: BLUE CROSS/BLUE SHIELD | Admitting: Certified Registered Nurse Anesthetist

## 2017-10-27 ENCOUNTER — Encounter (HOSPITAL_COMMUNITY)
Admission: RE | Disposition: A | Discharge status: Home / Self Care | Payer: Self-pay | Source: Ambulatory Visit | Attending: Surgery

## 2017-10-27 ENCOUNTER — Other Ambulatory Visit: Payer: Self-pay

## 2017-10-27 DIAGNOSIS — K429 Umbilical hernia without obstruction or gangrene: Secondary | ICD-10-CM | POA: Diagnosis present

## 2017-10-27 DIAGNOSIS — Z7984 Long term (current) use of oral hypoglycemic drugs: Secondary | ICD-10-CM | POA: Diagnosis not present

## 2017-10-27 DIAGNOSIS — D649 Anemia, unspecified: Secondary | ICD-10-CM | POA: Insufficient documentation

## 2017-10-27 DIAGNOSIS — I10 Essential (primary) hypertension: Secondary | ICD-10-CM | POA: Diagnosis not present

## 2017-10-27 DIAGNOSIS — Z79899 Other long term (current) drug therapy: Secondary | ICD-10-CM | POA: Insufficient documentation

## 2017-10-27 DIAGNOSIS — E119 Type 2 diabetes mellitus without complications: Secondary | ICD-10-CM | POA: Insufficient documentation

## 2017-10-27 DIAGNOSIS — G4730 Sleep apnea, unspecified: Secondary | ICD-10-CM | POA: Insufficient documentation

## 2017-10-27 HISTORY — PX: UMBILICAL HERNIA REPAIR: SHX196

## 2017-10-27 HISTORY — DX: Type 2 diabetes mellitus without complications: E11.9

## 2017-10-27 HISTORY — DX: Personal history of urinary calculi: Z87.442

## 2017-10-27 LAB — CBC WITH DIFFERENTIAL/PLATELET
Basophils Absolute: 0 10*3/uL (ref 0.0–0.1)
Basophils Relative: 1 %
EOS ABS: 0.2 10*3/uL (ref 0.0–0.7)
EOS PCT: 3 %
HCT: 44.1 % (ref 39.0–52.0)
Hemoglobin: 13.7 g/dL (ref 13.0–17.0)
LYMPHS ABS: 2.9 10*3/uL (ref 0.7–4.0)
LYMPHS PCT: 44 %
MCH: 23.8 pg — AB (ref 26.0–34.0)
MCHC: 31.1 g/dL (ref 30.0–36.0)
MCV: 76.6 fL — AB (ref 78.0–100.0)
MONO ABS: 0.6 10*3/uL (ref 0.1–1.0)
Monocytes Relative: 9 %
Neutro Abs: 2.8 10*3/uL (ref 1.7–7.7)
Neutrophils Relative %: 43 %
PLATELETS: 278 10*3/uL (ref 150–400)
RBC: 5.76 MIL/uL (ref 4.22–5.81)
RDW: 15.9 % — AB (ref 11.5–15.5)
WBC: 6.5 10*3/uL (ref 4.0–10.5)

## 2017-10-27 LAB — HEMOGLOBIN A1C
Hgb A1c MFr Bld: 6.6 % — ABNORMAL HIGH (ref 4.8–5.6)
Mean Plasma Glucose: 142.72 mg/dL

## 2017-10-27 LAB — BASIC METABOLIC PANEL
Anion gap: 8 (ref 5–15)
BUN: 11 mg/dL (ref 6–20)
CO2: 28 mmol/L (ref 22–32)
CREATININE: 1.11 mg/dL (ref 0.61–1.24)
Calcium: 9 mg/dL (ref 8.9–10.3)
Chloride: 104 mmol/L (ref 101–111)
GFR calc Af Amer: >60 mL/min (ref >60)
GLUCOSE: 107 mg/dL — AB (ref 65–99)
POTASSIUM: 4.6 mmol/L (ref 3.5–5.1)
Sodium: 140 mmol/L (ref 135–145)

## 2017-10-27 LAB — GLUCOSE, CAPILLARY
Glucose-Capillary: 122 mg/dL — ABNORMAL HIGH (ref 65–99)
Glucose-Capillary: 94 mg/dL (ref 65–99)
Glucose-Capillary: 100 mg/dL — ABNORMAL HIGH (ref 65–99)

## 2017-10-27 SURGERY — REPAIR, HERNIA, UMBILICAL, ADULT
Anesthesia: General | Site: Abdomen

## 2017-10-27 MED ORDER — PHENYLEPHRINE 40 MCG/ML (10ML) SYRINGE FOR IV PUSH (FOR BLOOD PRESSURE SUPPORT)
PREFILLED_SYRINGE | INTRAVENOUS | Status: AC
  Filled 2017-10-27: qty 10

## 2017-10-27 MED ORDER — OXYCODONE HCL 5 MG PO TABS: 5.0000 mg | ORAL_TABLET | ORAL | Status: DC | PRN

## 2017-10-27 MED ORDER — 0.9 % SODIUM CHLORIDE (POUR BTL) OPTIME
TOPICAL | Status: DC | PRN
  Administered 2017-10-27: 1000 mL

## 2017-10-27 MED ORDER — PROPOFOL 10 MG/ML IV BOLUS
INTRAVENOUS | Status: AC
  Filled 2017-10-27: qty 20

## 2017-10-27 MED ORDER — PHENYLEPHRINE HCL 10 MG/ML IJ SOLN
INTRAVENOUS | Status: DC | PRN
  Administered 2017-10-27: 30 ug/min via INTRAVENOUS

## 2017-10-27 MED ORDER — CEFAZOLIN SODIUM-DEXTROSE 2-4 GM/100ML-% IV SOLN
2.0000 g | INTRAVENOUS | Status: AC
Start: 2017-10-27 — End: 2017-10-27
  Administered 2017-10-27: 2 g via INTRAVENOUS

## 2017-10-27 MED ORDER — PHENYLEPHRINE 40 MCG/ML (10ML) SYRINGE FOR IV PUSH (FOR BLOOD PRESSURE SUPPORT)
PREFILLED_SYRINGE | INTRAVENOUS | Status: DC | PRN
  Administered 2017-10-27 (×2): 80 ug via INTRAVENOUS

## 2017-10-27 MED ORDER — SODIUM CHLORIDE 0.9% FLUSH: 3.0000 mL | INTRAVENOUS | Status: DC | PRN

## 2017-10-27 MED ORDER — SUCCINYLCHOLINE CHLORIDE 200 MG/10ML IV SOSY
PREFILLED_SYRINGE | INTRAVENOUS | Status: AC
  Filled 2017-10-27: qty 10

## 2017-10-27 MED ORDER — MIDAZOLAM HCL 2 MG/2ML IJ SOLN
INTRAMUSCULAR | Status: DC | PRN
  Administered 2017-10-27 (×2): 1 mg via INTRAVENOUS

## 2017-10-27 MED ORDER — SUGAMMADEX SODIUM 200 MG/2ML IV SOLN
INTRAVENOUS | Status: DC | PRN
  Administered 2017-10-27: 100 mg via INTRAVENOUS
  Administered 2017-10-27: 200 mg via INTRAVENOUS

## 2017-10-27 MED ORDER — BUPIVACAINE-EPINEPHRINE 0.25% -1:200000 IJ SOLN
INTRAMUSCULAR | Status: DC | PRN
  Administered 2017-10-27: 10 mL

## 2017-10-27 MED ORDER — PROPOFOL 10 MG/ML IV BOLUS
INTRAVENOUS | Status: DC | PRN
  Administered 2017-10-27: 200 mg via INTRAVENOUS

## 2017-10-27 MED ORDER — LIDOCAINE 2% (20 MG/ML) 5 ML SYRINGE
INTRAMUSCULAR | Status: DC | PRN
  Administered 2017-10-27: 100 mg via INTRAVENOUS

## 2017-10-27 MED ORDER — CEFAZOLIN SODIUM-DEXTROSE 2-4 GM/100ML-% IV SOLN
INTRAVENOUS | Status: AC
  Filled 2017-10-27: qty 100

## 2017-10-27 MED ORDER — ACETAMINOPHEN 500 MG PO TABS
ORAL_TABLET | ORAL | Status: AC
  Administered 2017-10-27: 1000 mg via ORAL
  Filled 2017-10-27: qty 2

## 2017-10-27 MED ORDER — ACETAMINOPHEN 500 MG PO TABS
1000.0000 mg | ORAL_TABLET | ORAL | Status: AC
  Administered 2017-10-27: 1000 mg via ORAL

## 2017-10-27 MED ORDER — SODIUM CHLORIDE 0.9 % IV SOLN: 250.0000 mL | INTRAVENOUS | Status: DC | PRN

## 2017-10-27 MED ORDER — DEXAMETHASONE SODIUM PHOSPHATE 10 MG/ML IJ SOLN
INTRAMUSCULAR | Status: DC | PRN
  Administered 2017-10-27: 4 mg via INTRAVENOUS

## 2017-10-27 MED ORDER — ROCURONIUM BROMIDE 10 MG/ML (PF) SYRINGE
PREFILLED_SYRINGE | INTRAVENOUS | Status: DC | PRN
  Administered 2017-10-27: 50 mg via INTRAVENOUS

## 2017-10-27 MED ORDER — FENTANYL CITRATE (PF) 250 MCG/5ML IJ SOLN
INTRAMUSCULAR | Status: AC
  Filled 2017-10-27: qty 5

## 2017-10-27 MED ORDER — FENTANYL CITRATE (PF) 100 MCG/2ML IJ SOLN
25.0000 ug | INTRAMUSCULAR | Status: DC | PRN
Start: 2017-10-27 — End: 2017-10-27

## 2017-10-27 MED ORDER — OXYCODONE-ACETAMINOPHEN 5-325 MG PO TABS: 1.0000 | ORAL_TABLET | Freq: Four times a day (QID) | ORAL | 0 refills | Status: DC | PRN

## 2017-10-27 MED ORDER — LACTATED RINGERS IV SOLN
INTRAVENOUS | Status: DC
  Administered 2017-10-27: 08:00:00 via INTRAVENOUS

## 2017-10-27 MED ORDER — BUPIVACAINE-EPINEPHRINE (PF) 0.25% -1:200000 IJ SOLN
INTRAMUSCULAR | Status: AC
  Filled 2017-10-27: qty 30

## 2017-10-27 MED ORDER — ACETAMINOPHEN 325 MG PO TABS: 650.0000 mg | ORAL_TABLET | ORAL | Status: DC | PRN

## 2017-10-27 MED ORDER — ACETAMINOPHEN 650 MG RE SUPP: 650.0000 mg | RECTAL | Status: DC | PRN

## 2017-10-27 MED ORDER — LIDOCAINE 2% (20 MG/ML) 5 ML SYRINGE
INTRAMUSCULAR | Status: AC
  Filled 2017-10-27: qty 5

## 2017-10-27 MED ORDER — DOCUSATE SODIUM 100 MG PO CAPS
100.0000 mg | ORAL_CAPSULE | Freq: Two times a day (BID) | ORAL | 0 refills | Status: AC
Start: 2017-10-27 — End: 2017-11-26

## 2017-10-27 MED ORDER — FENTANYL CITRATE (PF) 250 MCG/5ML IJ SOLN
INTRAMUSCULAR | Status: DC | PRN
  Administered 2017-10-27: 150 ug via INTRAVENOUS

## 2017-10-27 MED ORDER — HYDROMORPHONE HCL 1 MG/ML IJ SOLN: 0.2500 mg | INTRAMUSCULAR | Status: DC | PRN

## 2017-10-27 MED ORDER — EPHEDRINE SULFATE-NACL 50-0.9 MG/10ML-% IV SOSY
PREFILLED_SYRINGE | INTRAVENOUS | Status: DC | PRN
  Administered 2017-10-27: 10 mg via INTRAVENOUS

## 2017-10-27 MED ORDER — GABAPENTIN 300 MG PO CAPS
ORAL_CAPSULE | ORAL | Status: AC
  Administered 2017-10-27: 300 mg via ORAL
  Filled 2017-10-27: qty 1

## 2017-10-27 MED ORDER — CHLORHEXIDINE GLUCONATE 4 % EX LIQD: 60.0000 mL | Freq: Once | CUTANEOUS | Status: DC

## 2017-10-27 MED ORDER — SODIUM CHLORIDE 0.9% FLUSH: 3.0000 mL | Freq: Two times a day (BID) | INTRAVENOUS | Status: DC

## 2017-10-27 MED ORDER — MIDAZOLAM HCL 2 MG/2ML IJ SOLN
INTRAMUSCULAR | Status: AC
  Filled 2017-10-27: qty 2

## 2017-10-27 MED ORDER — GABAPENTIN 300 MG PO CAPS
300.0000 mg | ORAL_CAPSULE | ORAL | Status: AC
  Administered 2017-10-27: 300 mg via ORAL

## 2017-10-27 MED ORDER — ONDANSETRON HCL 4 MG/2ML IJ SOLN
INTRAMUSCULAR | Status: DC | PRN
  Administered 2017-10-27: 4 mg via INTRAVENOUS

## 2017-10-27 MED ORDER — ROCURONIUM BROMIDE 10 MG/ML (PF) SYRINGE
PREFILLED_SYRINGE | INTRAVENOUS | Status: AC
  Filled 2017-10-27: qty 5

## 2017-10-27 MED ORDER — EPHEDRINE 5 MG/ML INJ
INTRAVENOUS | Status: AC
  Filled 2017-10-27: qty 10

## 2017-10-27 MED ORDER — EPINEPHRINE PF 1 MG/10ML IJ SOSY
PREFILLED_SYRINGE | INTRAMUSCULAR | Status: AC
  Filled 2017-10-27: qty 10

## 2017-10-27 SURGICAL SUPPLY — 38 items
BLADE CLIPPER SURG (BLADE) ×4 IMPLANT
BLADE SURG 15 STRL LF DISP TIS (BLADE) ×2 IMPLANT
BLADE SURG 15 STRL SS (BLADE) ×2
CANISTER SUCT 3000ML PPV (MISCELLANEOUS) ×4 IMPLANT
CHLORAPREP W/TINT 26ML (MISCELLANEOUS) ×4 IMPLANT
COVER SURGICAL LIGHT HANDLE (MISCELLANEOUS) ×4 IMPLANT
DERMABOND ADVANCED (GAUZE/BANDAGES/DRESSINGS) ×2
DERMABOND ADVANCED .7 DNX12 (GAUZE/BANDAGES/DRESSINGS) ×2 IMPLANT
DRAPE LAPAROTOMY 100X72 PEDS (DRAPES) ×4 IMPLANT
DRAPE UTILITY XL STRL (DRAPES) ×4 IMPLANT
ELECT CAUTERY BLADE 6.4 (BLADE) ×4 IMPLANT
ELECT REM PT RETURN 9FT ADLT (ELECTROSURGICAL) ×4
ELECTRODE REM PT RTRN 9FT ADLT (ELECTROSURGICAL) ×2 IMPLANT
GAUZE SPONGE 2X2 8PLY STRL LF (GAUZE/BANDAGES/DRESSINGS) IMPLANT
GAUZE SPONGE 4X4 16PLY XRAY LF (GAUZE/BANDAGES/DRESSINGS) ×4 IMPLANT
GLOVE BIO SURGEON STRL SZ 6 (GLOVE) ×4 IMPLANT
GLOVE BIOGEL PI IND STRL 6.5 (GLOVE) ×2 IMPLANT
GLOVE BIOGEL PI INDICATOR 6.5 (GLOVE) ×2
GOWN STRL REUS W/ TWL LRG LVL3 (GOWN DISPOSABLE) ×4 IMPLANT
GOWN STRL REUS W/TWL LRG LVL3 (GOWN DISPOSABLE) ×4
KIT BASIN OR (CUSTOM PROCEDURE TRAY) ×4 IMPLANT
KIT ROOM TURNOVER OR (KITS) ×4 IMPLANT
NEEDLE HYPO 25GX1X1/2 BEV (NEEDLE) ×4 IMPLANT
NS IRRIG 1000ML POUR BTL (IV SOLUTION) ×4 IMPLANT
PACK SURGICAL SETUP 50X90 (CUSTOM PROCEDURE TRAY) ×4 IMPLANT
PAD ARMBOARD 7.5X6 YLW CONV (MISCELLANEOUS) ×4 IMPLANT
PENCIL BUTTON HOLSTER BLD 10FT (ELECTRODE) ×4 IMPLANT
SPONGE GAUZE 2X2 STER 10/PKG (GAUZE/BANDAGES/DRESSINGS)
SUT ETHIBOND 0 MO6 C/R (SUTURE) ×4 IMPLANT
SUT MNCRL AB 4-0 PS2 18 (SUTURE) ×4 IMPLANT
SUT VIC AB 3-0 SH 27 (SUTURE) ×2
SUT VIC AB 3-0 SH 27X BRD (SUTURE) ×2 IMPLANT
SYR BULB 3OZ (MISCELLANEOUS) ×4 IMPLANT
SYR CONTROL 10ML LL (SYRINGE) ×4 IMPLANT
TOWEL OR 17X24 6PK STRL BLUE (TOWEL DISPOSABLE) ×4 IMPLANT
TUBE CONNECTING 12'X1/4 (SUCTIONS) ×1
TUBE CONNECTING 12X1/4 (SUCTIONS) ×3 IMPLANT
YANKAUER SUCT BULB TIP NO VENT (SUCTIONS) ×4 IMPLANT

## 2017-10-27 NOTE — Discharge Instructions (Signed)
HERNIA REPAIR: POST OP INSTRUCTIONS  ######################################################################  EAT Gradually transition to a high fiber diet with a fiber supplement over the next few weeks after discharge.  Start with a pureed / full liquid diet (see below)  WALK Walk an hour a day.  Control your pain to do that.    CONTROL PAIN Control pain so that you can walk, sleep, tolerate sneezing/coughing, go up/down stairs.  HAVE A BOWEL MOVEMENT DAILY Keep your bowels regular to avoid problems.  OK to try a laxative to override constipation.  OK to use an antidairrheal to slow down diarrhea.  Call if not better after 2 tries  CALL IF YOU HAVE PROBLEMS/CONCERNS Call if you are still struggling despite following these instructions. Call if you have concerns not answered by these instructions  ######################################################################    1. DIET: Follow a light bland diet the first 24 hours after arrival home, such as soup, liquids, crackers, etc.  Be sure to include lots of fluids daily.  Avoid fast food or heavy meals as your are more likely to get nauseated.  Eat a low fat the next few days after surgery. 2. Take your usually prescribed home medications unless otherwise directed. 3. PAIN CONTROL: a. Pain is best controlled by a usual combination of three different methods TOGETHER: i. Ice/Heat ii. Over the counter pain medication iii. Prescription pain medication b. Most patients will experience some swelling and bruising around the hernia(s) such as the bellybutton, groins, or old incisions.  Ice packs or heating pads (30-60 minutes up to 6 times a day) will help. Use ice for the first few days to help decrease swelling and bruising, then switch to heat to help relax tight/sore spots and speed recovery.  Some people prefer to use ice alone, heat alone, alternating between ice & heat.  Experiment to what works for you.  Swelling and bruising can take  several weeks to resolve.   c. It is helpful to take an over-the-counter pain medication regularly for the first few weeks.  Choose one of the following that works best for you: i. Naproxen (Aleve, etc)  Two 220mg  tabs twice a day ii. Ibuprofen (Advil, etc) Three 200mg  tabs four times a day (every meal & bedtime) iii. Acetaminophen (Tylenol, etc) 325-650mg  four times a day (every meal & bedtime) d. A  prescription for pain medication should be given to you upon discharge.  Take your pain medication as prescribed.  i. If you are having problems/concerns with the prescription medicine (does not control pain, nausea, vomiting, rash, itching, etc), please call us [phone] to see if we need to switch you to a different pain medicine that will work better for you and/or control your side effect better. ii. If you need a refill on your pain medication, please contact your pharmacy.  They will contact our office to request authorization. Prescriptions will not be filled after 5 pm or on week-ends. 4. Avoid getting constipated.  Between the surgery and the pain medications, it is common to experience some constipation.  Increasing fluid intake and taking a fiber supplement (such as Metamucil, Citrucel, FiberCon, MiraLax, etc) 1-2 times a day regularly will usually help prevent this problem from occurring.  A mild laxative (prune juice, Milk of Magnesia, MiraLax, etc) should be taken according to package directions if there are no bowel movements after 48 hours.   5. Wash / shower every day.  You may shower over the skin glue which is waterproof.   6. Skin  glue will flake off after about 2 weeks.  You may leave the incision open to air.  You may replace a dressing/Band-Aid to cover the incision for comfort if you wish.  Continue to shower over incision(s) after the dressing is off.    7. ACTIVITIES as tolerated:   a. You may resume regular (light) daily activities beginning the next day--such as daily  self-care, walking, climbing stairs--gradually increasing activities as tolerated.  If you can walk 30 minutes without difficulty, it is safe to try more intense activity such as jogging, treadmill, bicycling, low-impact aerobics, swimming, etc. b. Save the most intensive and strenuous activity for last such as sit-ups, heavy lifting, contact sports, etc  Refrain from any heavy lifting or straining until 6 weeks after surgery. c. DO NOT PUSH THROUGH PAIN.  Let pain be your guide: If it hurts to do something, don't do it.  Pain is your body warning you to avoid that activity for another week until the pain goes down. d. You may drive when you are no longer taking prescription pain medication, you can comfortably wear a seatbelt, and you can safely maneuver your car and apply brakes. e. Bonita QuinYou may have sexual intercourse when it is comfortable.  8. FOLLOW UP in our office a. Please call [facility] at [phone] to set up an appointment to see your surgeon in the office for a follow-up appointment approximately 2-3 weeks after your surgery. b. Make sure that you call for this appointment the day you arrive home to insure a convenient appointment time. 9.  IF YOU HAVE DISABILITY OR FAMILY LEAVE FORMS, BRING THEM TO THE OFFICE FOR PROCESSING.  DO NOT GIVE THEM TO YOUR DOCTOR.  WHEN TO CALL US [phone]: 1. Poor pain control 2. Reactions / problems with new medications (rash/itching, nausea, etc)  3. Fever over 101.5 F (38.5 C) 4. Inability to urinate 5. Nausea and/or vomiting 6. Worsening swelling or bruising 7. Continued bleeding from incision. 8. Increased pain, redness, or drainage from the incision   The clinic staff is available to answer your questions during regular business hours (8:30am-5pm).  Please dont hesitate to call and ask to speak to one of our nurses for clinical concerns.   If you have a medical emergency, go to the nearest emergency room or call 911.  A [facility] from [facility] is always on call at the hospitals in [facility]  [facility], [place] [address], [address], [place], [place]  [address] ?  [address] [address], [place], [place]   [id number] MAIN: [phone] ? TOLL FREE: [phone] ? FAX: [phone] [email or web address]

## 2017-10-27 NOTE — Op Note (Signed)
Operative Note  Walter Malessaiah Reitman Jr.  [id number]  [id number]  10/27/2017   Surgeon: Lady Deutscherhelsea A ConnorMD  Assistant: OR staff  Procedure performed: open repair of umbilical hernia  Preop diagnosis: umbilical hernia Post-op diagnosis/intraop findings: same, defect 1cm  Specimens: no Retained items: no EBL: minimal cc Complications: none  Description of procedure: After obtaining informed consent the patient was taken to the operating room and placed supine on operating room table wheregeneral endotracheal anesthesia was initiated, preoperative antibiotics were administered, SCDs applied, and a formal timeout was performed. The abdomen was clipped, prepped and draped in the usual sterile fashion. After infiltration with local, a curvilinear infraumbilical incision was made. Cautery and blunt dissection used to separate the umbilical skin from the hernia sac. The fascia was cleaned off circumferentially and the sac excised at the level of the fascia. The defect was about 1cm in diameter. The bowel was visible just below but not within the hernia defect and great care was taken to ensure no injury. The fascia was reapproximated transversely with interrupted 0 ethibonds. Hemostasis was ensured within the wound. The umbilical skin was tacked back down to the fascia with a single 3-0 vicryl. The skin was closed with running subcuticular monocryl and dermabond. The patient was then awakened, extubated and taken to PACU in stable condition.   All counts were correct at the completion of the case.

## 2017-10-27 NOTE — Transfer of Care (Signed)
Immediate Anesthesia Transfer of Care Note  Patient: Walter Malessaiah Hemmer Jr.  Procedure(s) Performed: HERNIA REPAIR UMBILICAL ADULT (N/A Abdomen)  Patient Location: PACU  Anesthesia Type:General  Level of Consciousness: awake, alert , oriented and patient cooperative  Airway & Oxygen Therapy: Patient Spontanous Breathing and Patient connected to nasal cannula oxygen  Post-op Assessment: Report given to RN, Post -op Vital signs reviewed and stable and Patient moving all extremities X 4  Post vital signs: Reviewed and stable  Last Vitals:  Vitals:   10/27/17 0720 10/27/17 1106  BP: 130/86   Pulse: 72 88  Resp: 18 12  Temp: 36.7 C   SpO2: 99% 99%    Last Pain:  Vitals:   10/27/17 0720  TempSrc: Oral      Patients Stated Pain Goal: 3 (10/27/17 0731)  Complications: No apparent anesthesia complications

## 2017-10-27 NOTE — Anesthesia Postprocedure Evaluation (Signed)
Anesthesia Post Note  Patient: Walter Runkles Jr.  Procedure(s) Performed: HERNIA REPAIR UMBILICAL ADULT (N/A Abdomen)     Patient location during evaluation: PACU Anesthesia Type: General Level of consciousness: awake Pain management: pain level controlled Vital Signs Assessment: post-procedure vital signs reviewed and stable Respiratory status: spontaneous breathing Cardiovascular status: stable Anesthetic complications: no    Last Vitals:  Vitals:   10/27/17 1207 10/27/17 1221  BP: 125/67 (!) 122/59  Pulse: 79 72  Resp: (!) 23 11  Temp:  (!) 36.3 C  SpO2: 98% 100%    Last Pain:  Vitals:   10/27/17 0720  TempSrc: Oral                 Marchel Foote     

## 2017-10-27 NOTE — H&P (Signed)
  Walter Reynolds 10/09/2017 11:16 AM Location: [facility] Patient #: [id number] DOB: 10/28/1971 Married / Language: English / Race: Black or African American Male   History of Present Illness (Chelsea A. Fredricka Bonineonnor MD; 10/09/2017 12:43 PM) Patient words: This is a pleasant 46 year old gentleman with an umbilical hernia. Does not know how long it has been there. It does not really bother him. Denies any episodes of incarceration, does have some intermittent constipation. No nausea or bloating. He is interested in possibly getting repaired. He is not a smoker. He works as a Furniture conservator/restorerproduction manager at [employer] and does have to do a fair amount of heavy lifting there. 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 No prior abdominal surgeries.  The patient is a 46 year old male.   Allergies (Tanisha A. Manson PasseyBrown, RMA; 10/09/2017 11:18 AM) Erythromycin *DERMATOLOGICALS*  Doxycycline *DERMATOLOGICALS*   Medication History (Tanisha A. Manson PasseyBrown, RMA; 10/09/2017 11:18 AM) Lisinopril (20MG  Tablet, Oral) Active. MetFORMIN HCl (500MG  Tablet, Oral) Active. Fenofibrate (145MG  Tablet, Oral) Active. Fluticasone Propionate (50MCG/ACT Suspension, Nasal as needed) Active. Medications Reconciled  Vitals (Tanisha A. Brown RMA; 10/09/2017 11:17 AM) 10/09/2017 11:16 AM Weight: 240.2 lb Height: 67in Body Surface Area: 2.19 m Body Mass Index: 37.62 kg/m  Temp.: 97.25F  Pulse: 88 (Regular)  BP: 132/86 (Sitting, Left Arm, Standard)       Physical Exam (Chelsea A. Fredricka Bonineonnor MD; 10/09/2017 12:45 PM) General Note: Alert and well-appearing   Integumentary Note: Warm and dry   Eye Note: No scleral icterus, extraocular motions intact   Chest and Lung Exam Note: Unlabored, clear bilaterally   Cardiovascular Note: Regular rate and rhythm, no pedal edema   Abdomen Note: Obese, nontender, there is a small reduced umbilical hernia  with a defect approximately 1 cm in diameter.   Neurologic Note: Grossly intact, normal gait   Neuropsychiatric Note: Normal mood and affect, appropriate insight   Musculoskeletal Note: Strength symmetrical throughout, no deformity     Assessment & Plan (Chelsea A. Fredricka Bonineonnor MD; 10/09/2017 12:50 PM) UMBILICAL HERNIA (K42.9) Story: Discussed the nature of hernias and attempts of repair available. I recommend proceeding with an open umbilical hernia repair with mesh. We discussed how the surgery is done and the risks involved including bleeding, infection, pain, scarring, intra-abdominal injury, hernia recurrence, and chronic abdominal pain. He had several tidal questions all which were answered. We discussed the risks of not repairing the hernia which would include a low risk of incarceration/strangulation and the signs and symptoms of that which should prompt him to be evaluated emergently, also discussed the risk of increasing size and symptoms from the hernia if not repaired. I advised him that this can be done at a convenient time for him. He will call us when he is ready to schedule.  Signed electronically by Berna Buehelsea A Connor, MD (10/09/2017 12:50 PM)

## 2017-10-27 NOTE — Anesthesia Postprocedure Evaluation (Signed)
Anesthesia Post Note  Patient: Walter Malessaiah Hartner Jr.  Procedure(s) Performed: HERNIA REPAIR UMBILICAL ADULT (N/A Abdomen)     Patient location during evaluation: PACU Anesthesia Type: General Level of consciousness: awake Pain management: pain level controlled Vital Signs Assessment: post-procedure vital signs reviewed and stable Respiratory status: spontaneous breathing Cardiovascular status: stable Anesthetic complications: no    Last Vitals:  Vitals:   10/27/17 1207 10/27/17 1221  BP: 125/67 (!) 122/59  Pulse: 79 72  Resp: (!) 23 11  Temp:  (!) 36.3 C  SpO2: 98% 100%    Last Pain:  Vitals:   10/27/17 0720  TempSrc: Oral                 Allani Reber

## 2017-10-27 NOTE — Anesthesia Preprocedure Evaluation (Signed)
Anesthesia Evaluation  Patient identified by MRN, date of birth, ID band Patient awake    Reviewed: Allergy & Precautions, NPO status , Patient's Chart, lab work & pertinent test results  History of Anesthesia Complications (+) Emergence Delirium  Airway Mallampati: II  TM Distance: >3 FB     Dental   Pulmonary sleep apnea ,    breath sounds clear to auscultation       Cardiovascular hypertension,  Rhythm:Regular Rate:Normal     Neuro/Psych    GI/Hepatic   Endo/Other  diabetes  Renal/GU      Musculoskeletal   Abdominal   Peds  Hematology  (+) anemia ,   Anesthesia Other Findings   Reproductive/Obstetrics                             Anesthesia Physical Anesthesia Plan  ASA: III  Anesthesia Plan: General   Post-op Pain Management:    Induction:   PONV Risk Score and Plan: 2 and Treatment may vary due to age or medical condition  Airway Management Planned: Oral ETT  Additional Equipment:   Intra-op Plan:   Post-operative Plan: Extubation in OR  Informed Consent: I have reviewed the patients History and Physical, chart, labs and discussed the procedure including the risks, benefits and alternatives for the proposed anesthesia with the patient or authorized representative who has indicated his/her understanding and acceptance.   Dental advisory given  Plan Discussed with: CRNA and Anesthesiologist  Anesthesia Plan Comments:         Anesthesia Quick Evaluation

## 2017-10-27 NOTE — Anesthesia Procedure Notes (Signed)
Procedure Name: Intubation Date/Time: 10/27/2017 10:11 AM Performed by: Julieta Bellini, CRNA Pre-anesthesia Checklist: Patient identified, Suction available, Patient being monitored and Emergency Drugs available Patient Re-evaluated:Patient Re-evaluated prior to induction Oxygen Delivery Method: Circle system utilized Preoxygenation: Pre-oxygenation with 100% oxygen Induction Type: IV induction Ventilation: Mask ventilation with difficulty and Oral airway inserted - appropriate to patient size Laryngoscope Size: Mac and 4 Grade View: Grade I Tube type: Oral Tube size: 7.5 mm Number of attempts: 1 Airway Equipment and Method: Stylet Placement Confirmation: ETT inserted through vocal cords under direct vision,  positive ETCO2 and breath sounds checked- equal and bilateral Secured at: 23 cm Tube secured with: Tape Dental Injury: Teeth and Oropharynx as per pre-operative assessment

## 2017-10-28 ENCOUNTER — Encounter (HOSPITAL_COMMUNITY): Payer: Self-pay | Admitting: Surgery

## 2017-10-31 ENCOUNTER — Ambulatory Visit (INDEPENDENT_AMBULATORY_CARE_PROVIDER_SITE_OTHER): Payer: BLUE CROSS/BLUE SHIELD | Admitting: Podiatry

## 2017-10-31 ENCOUNTER — Encounter: Payer: Self-pay | Admitting: Podiatry

## 2017-10-31 ENCOUNTER — Ambulatory Visit (INDEPENDENT_AMBULATORY_CARE_PROVIDER_SITE_OTHER): Payer: BLUE CROSS/BLUE SHIELD

## 2017-10-31 DIAGNOSIS — M21612 Bunion of left foot: Secondary | ICD-10-CM

## 2017-10-31 DIAGNOSIS — M2012 Hallux valgus (acquired), left foot: Secondary | ICD-10-CM | POA: Diagnosis not present

## 2017-11-19 ENCOUNTER — Ambulatory Visit (INDEPENDENT_AMBULATORY_CARE_PROVIDER_SITE_OTHER): Payer: BLUE CROSS/BLUE SHIELD

## 2017-11-19 ENCOUNTER — Ambulatory Visit (INDEPENDENT_AMBULATORY_CARE_PROVIDER_SITE_OTHER): Payer: BLUE CROSS/BLUE SHIELD | Admitting: Podiatry

## 2017-11-19 ENCOUNTER — Encounter: Payer: Self-pay | Admitting: Podiatry

## 2017-11-19 DIAGNOSIS — M21612 Bunion of left foot: Secondary | ICD-10-CM

## 2017-11-19 DIAGNOSIS — M2012 Hallux valgus (acquired), left foot: Secondary | ICD-10-CM | POA: Diagnosis not present

## 2017-11-19 MED ORDER — NONFORMULARY OR COMPOUNDED ITEM: 120.0000 g | Freq: Four times a day (QID) | 2 refills | Status: DC

## 2017-11-19 NOTE — Progress Notes (Signed)
Subjective: Walter Malessaiah Leisure Jr. is a 46 y.o. is seen today in office s/p left scarf/akin bunionectomy preformed on 09/24/2017 with Dr. Samuella CotaPrice. They state their pain is improving.  He does complain of sharp pain along the incision going into the big toe and this is intermittent.  He is not taking any pain medication for this.  He did go back into a regular shoe with due to swelling he has remained in the surgical shoe as he feels that the incision has been rubbing on the shoe.  Denies any recent injury or trauma.. Denies any systemic complaints such as fevers, chills, nausea, vomiting. No calf pain, chest pain, shortness of breath.   He also states he has a history of a heel spur and plantar fasciitis although currently not symptomatic and he would like to get custom inserts once the swelling comes down.  Objective: General: No acute distress, AAOx3  DP/PT pulses palpable 2/4, CRT < 3 sec to all digits.  Protective sensation intact. Motor function intact.  LEFT foot: Incision is well coapted without any evidence of dehiscence and eschar is formed.  On the proximal one third of the incision there is a small area which looks like a scab had come off but there is no definitive open sore identified and there is no drainage or pus or any clinical signs of infection.. There is no surrounding erythema, ascending cellulitis, fluctuance, crepitus, malodor, drainage/purulence. There is mild edema around the surgical site. There is minimal pain along the surgical site.  Toes and rectus position.  No pain with MPJ range of motion. No other areas of tenderness to bilateral lower extremities.  No other open lesions or pre-ulcerative lesions.  No pain with calf compression, swelling, warmth, erythema.   Assessment and Plan:  Status post left foot surgery, doing well with no complications   -Treatment options discussed including all alternatives, risks, and complications -X-rays were obtained and reviewed.  Hardware  intact to the hallux as well as the first metatarsal without any complicating factors.  There is no evidence of acute fracture present today. -I ordered a compound cream today to include gabapentin as well as topical anti-inflammatory to use as needed along the incision.  This was ordered today through [facility] -At this point we will start physical therapy.  [facility].  They will be contacting him but discussed that they do not call in the next couple days to call our office. -I dispensed an offloading pad that I made for the bunion site. -Monitor for any clinical signs or symptoms of infection and directed to call the office immediately should any occur or go to the ER. -Follow-up in 2 weeks with Dr. Samuella CotaPrice or sooner if any issues are to arise.  He agrees this plan.  Ovid CurdMatthew Wagoner, DPM

## 2017-11-21 NOTE — Addendum Note (Signed)
Addended by: Clifton JamesQUINTANA, Berania Peedin L on: 11/21/2017 11:35 AM   Modules accepted: Orders

## 2017-12-02 ENCOUNTER — Encounter: Payer: Self-pay | Admitting: Physician Assistant

## 2017-12-02 ENCOUNTER — Other Ambulatory Visit: Payer: Self-pay

## 2017-12-02 ENCOUNTER — Ambulatory Visit (INDEPENDENT_AMBULATORY_CARE_PROVIDER_SITE_OTHER): Payer: BLUE CROSS/BLUE SHIELD | Admitting: Physician Assistant

## 2017-12-02 VITALS — BP 118/82 | HR 87 | Temp 98.4°F | Resp 18 | Ht 67.0 in | Wt 250.0 lb

## 2017-12-02 DIAGNOSIS — E785 Hyperlipidemia, unspecified: Secondary | ICD-10-CM

## 2017-12-02 DIAGNOSIS — R450 Nervousness: Secondary | ICD-10-CM

## 2017-12-02 DIAGNOSIS — I10 Essential (primary) hypertension: Secondary | ICD-10-CM

## 2017-12-02 DIAGNOSIS — D649 Anemia, unspecified: Secondary | ICD-10-CM | POA: Diagnosis not present

## 2017-12-02 DIAGNOSIS — E669 Obesity, unspecified: Secondary | ICD-10-CM | POA: Diagnosis not present

## 2017-12-02 DIAGNOSIS — E119 Type 2 diabetes mellitus without complications: Secondary | ICD-10-CM | POA: Diagnosis not present

## 2017-12-02 DIAGNOSIS — Z6839 Body mass index (BMI) 39.0-39.9, adult: Secondary | ICD-10-CM

## 2017-12-02 MED ORDER — METFORMIN HCL ER (MOD) 1000 MG PO TB24: 1000.0000 mg | ORAL_TABLET | Freq: Every day | ORAL | 3 refills | Status: DC

## 2017-12-02 NOTE — Patient Instructions (Addendum)
You can take all your medicines at night. I've CHANGED the metformin from twice a day to a ONCE A DAY formulation. If your liver tests are normal, we'll plan to change the fenofibrate to a statin medication to reduce your risk of a heart attack and stroke.  Please resume some exercise. Consider swimming until your foot is better.    IF you received an x-ray today, you will receive an invoice from [facility]. Please contact [facility] at [phone] with questions or concerns regarding your invoice.   IF you received labwork today, you will receive an invoice from [facility]. Please contact [facility] at [phone] with questions or concerns regarding your invoice.   Our billing staff will not be able to assist you with questions regarding bills from these companies.  You will be contacted with the lab results as soon as they are available. The fastest way to get your results is to activate your My Chart account. Instructions are located on the last page of this paperwork. If you have not heard from us regarding the results in 2 weeks, please contact this office.

## 2017-12-02 NOTE — Progress Notes (Signed)
Chief Complaint  Patient presents with  . Diabetes  . Follow-up   Subjective:    Patient ID: Walter Malessaiah Sheller Jr., male    DOB: [birth year], 46 y.o.   MRN: [id number]  HPI Patient presents for evaluation of diabetes.   He has been working on loosing weight but hasn't been able to work out due to recent surgeries. He eats 2-3 meals a day.  He has been eating out a lot since his surgeries but is working on getting back to eating mostly home cooked meals.   He is taking his medication and is tolerating them well. His only complain is feeling "jittery" some nights. He is interested in tools to check his blood sugar and blood pressure.  He had left foot bunion surgery (09/17/17) and umbilical hernia operation (10/27/17). He is still having pain on his left foot and is seeing a podiatrist. Pain is sharp and is on the medial side of his left foot next to his big toe. Pain is a 5/10. No bleeding or drainage. He occassionally hs swelling. Pain is aggravated by a lot of walking. He was on percocet and meloxicam for pain but he is no longer taking them.  He endorses increased blurry vision. [facility]. He will inquire about his next eye exam and make an appointment.   Review of Systems  Constitutional: Negative for appetite change, fatigue, fever and unexpected weight change.  HENT: Negative for rhinorrhea and sore throat.   Eyes: Positive for visual disturbance.  Respiratory: Negative for cough and shortness of breath.   Cardiovascular: Negative for chest pain and palpitations.  Gastrointestinal: Negative for abdominal pain, blood in stool, constipation, diarrhea, nausea and vomiting.  Genitourinary: Negative for dysuria, frequency, hematuria and urgency.  Musculoskeletal: Positive for joint swelling (left lateral foot).  Neurological: Negative for light-headedness and headaches.   Patient Active Problem List   Diagnosis Date Noted  . Class 2 obesity without serious  comorbidity with body mass index (BMI) of 39.0 to 39.9 in adult 09/03/2017  . Well controlled type 2 diabetes mellitus (HCC) 09/03/2017  . Anemia 04/14/2017  . Paresthesia 02/12/2017  . Hyperlipidemia 05/02/2016  . RBC microcytosis 05/02/2016  . OSA (obstructive sleep apnea) 09/06/2015  . Benign essential HTN 09/06/2015  . BMI 39.0-39.9,adult 09/06/2015  . Metabolic syndrome 06/13/2014   Current Outpatient Medications on File Prior to Visit  Medication Sig Dispense Refill  . fenofibrate (TRICOR) 145 MG tablet Take 1 tablet (145 mg total) by mouth daily. 90 tablet 3  . fluticasone (FLONASE) 50 MCG/ACT nasal spray Place 2 sprays into both nostrils daily. 16 g 12  . lisinopril (PRINIVIL,ZESTRIL) 20 MG tablet Take 1 tablet (20 mg total) by mouth daily. 90 tablet 3  . metFORMIN (GLUCOPHAGE) 500 MG tablet TAKE 1 TABLET TWICE A DAY WITH A MEAL (Patient taking differently: Take 500mg  by mouth once daily at night) 180 tablet 0  . NONFORMULARY OR COMPOUNDED ITEM Apply 120 g topically 4 (four) times daily. Diclofenac 3% Baclofen 2%, Bupivacaine 1%, Gabapentin 6% Ibuprofen 3%, Pentoxifylline 3% 120 each 2   No current facility-administered medications on file prior to visit.    Allergies  Allergen Reactions  . Doxycycline Hives  . Erythromycin Base Hives   Social History   Socioeconomic History  . Marital status: Married    Spouse name: Anne NgLatoya C Narain  . Number of children: 2  . Years of education: Associates  . Highest education level: Not on file  Social Needs  . Financial resource strain: Not on file  . Food insecurity - worry: Not on file  . Food insecurity - inability: Not on file  . Transportation needs - medical: Not on file  . Transportation needs - non-medical: Not on file  Occupational History  . Occupation: Merchandiser, retail    Comment: [employer]  Tobacco Use  . Smoking status: Never Smoker  . Smokeless tobacco: Never Used  Substance and Sexual Activity  . Alcohol use: No     Alcohol/week: 0.0 oz  . Drug use: No  . Sexual activity: Yes    Partners: Female    Birth control/protection: Condom  Other Topics Concern  . Not on file  Social History Narrative   Lives with his wife and their 2 children.   He has 3 sons from a previous relationship that live independently.   Formerly in the [employer].   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   Past Surgical History:  Procedure Laterality Date  . CARPAL TUNNEL RELEASE Right 2007  . CARPAL TUNNEL RELEASE Left 10/28/2014   Procedure: LEFT CARPAL TUNNEL RELEASE;  Surgeon: Dairl Ponder, MD;  Location: [facility];  Service: Orthopedics;  Laterality: Left;  . FOOT SURGERY Left   . INGUINAL HERNIA REPAIR     times two, each side  . KIDNEY STONE SURGERY    . MASS EXCISION Left 10/28/2014   Procedure: LEFT WRIST VOLAR MASS EXCISION;  Surgeon: Dairl Ponder, MD;  Location: [facility];  Service: Orthopedics;  Laterality: Left;  . UMBILICAL HERNIA REPAIR N/A 10/27/2017   Procedure: HERNIA REPAIR UMBILICAL ADULT;  Surgeon: Berna Bue, MD;  Location: [facility];  Service: General;  Laterality: N/A;      Objective:  Vitals:   12/02/17 0919  BP: 118/82  Pulse: 87  Resp: 18  Temp: 98.4 F (36.9 C)  SpO2: 94%     Physical Exam  Constitutional: He is oriented to person, place, and time. He appears well-developed and well-nourished. No distress.  HENT:  Head: Normocephalic and atraumatic.  Right Ear: External ear normal.  Left Ear: External ear normal.  Mouth/Throat: Oropharynx is clear and moist.  Eyes: Conjunctivae and EOM are normal. Pupils are equal, round, and reactive to light.  Neck: Neck supple. No thyromegaly present.  Cardiovascular: Normal rate, regular rhythm, normal heart sounds and intact distal pulses. Exam reveals no gallop and no friction rub.  No murmur  heard. Pulmonary/Chest: Effort normal and breath sounds normal.  Musculoskeletal:  Well healing scar on lateral left foot.   Lymphadenopathy:    He has no cervical adenopathy.  Neurological: He is alert and oriented to person, place, and time.  Skin: Skin is warm.  Psychiatric: He has a normal mood and affect.      Assessment & Plan:  1. Well controlled type 2 diabetes mellitus (HCC) Stable. Continue current medication regimen. Discussed importance of having a healthy diet and exercising. Patient agrees to increase physical activity as tolerated once his foot problem is resolved.  - Ordered a Comprehensive metabolic panel - metFORMIN (GLUMETZA) 1000 MG (MOD) 24 hr tablet; Take 1 tablet (1,000 mg total) by mouth daily with breakfast.  Dispense: 90 tablet; Refill: 3  2. Hyperlipidemia, unspecified hyperlipidemia type Discussed initiation of a statin after checking liver tests.  - Comprehensive metabolic panel  3. Benign essential HTN Controlled. Continue current medication regimen.  -  Orders: Comprehensive metabolic panel - TSH  4. Anemia, unspecified type Resolved.    5. Class 2 obesity without serious comorbidity with body mass index (BMI) of 39.0 to 39.9 in adult, unspecified obesity type Discussed lifestyle changes and encouraged patient to continue working on weight loss.  6. Feeling jittery Ordered TSH to rule out thyroid dysfunction. - TSH  Follow up in 3 months for re-evaluation of diabetes, blood pressure, cholesterol and weight.  Monica Zahler W HewittMoche, [place] ForestStudent-PA

## 2017-12-02 NOTE — Progress Notes (Signed)
Patient ID: Walter Reynolds., male    DOB: 1971-03-05, 46 y.o.   MRN: [id number]  PCP: Porfirio Oar, PA-C  Chief Complaint  Patient presents with  . Prediabetes  . Follow-up    Subjective:   Presents for evaluation of diabetes.  Since his last visit with me he has undergone LEFT bunionectomy and umbilical hernia repair. Recovery from these has limited his ability to exercise, and he has been eating out a lot.Marland Kitchen He continues to have pain at the bunionectomy site, and is doing therapy.  Not on a statin. Unclear why that is.  Some visual changes-blurry. He is due for a routine exam and will schedule that soon. He also notes feeling jittery, without identified cause or trigger. Not anxious.  Review of Systems Constitutional: Negative for appetite change, fatigue, fever and unexpected weight change.  HENT: Negative for rhinorrhea and sore throat.   Eyes: Positive for visual disturbance.  Respiratory: Negative for cough and shortness of breath.   Cardiovascular: Negative for chest pain and palpitations.  Gastrointestinal: Negative for abdominal pain, blood in stool, constipation, diarrhea, nausea and vomiting.  Genitourinary: Negative for dysuria, frequency, hematuria and urgency.  Musculoskeletal: Positive for joint swelling (left lateral foot).  Neurological: Negative for light-headedness and headaches.   Depression screen [facility] 2/9 12/02/2017 08/12/2017 04/14/2017 02/12/2017 05/02/2016  Decreased Interest 0 0 0 0 0  Down, Depressed, Hopeless 0 0 0 0 0  PHQ - 2 Score 0 0 0 0 0       Patient Active Problem List   Diagnosis Date Noted  . Umbilical hernia without obstruction and without gangrene 09/03/2017  . Periumbilical abdominal pain 09/03/2017  . Class 2 obesity due to excess calories without serious comorbidity with body mass index (BMI) of 39.0 to 39.9 in adult 09/03/2017  . Well controlled type 2 diabetes mellitus (HCC) 09/03/2017  . Anemia 04/14/2017  .  Paresthesia 02/12/2017  . Hyperlipidemia 05/02/2016  . RBC microcytosis 05/02/2016  . OSA (obstructive sleep apnea) 09/06/2015  . Benign essential HTN 09/06/2015  . BMI 39.0-39.9,adult 09/06/2015  . Prediabetes 06/13/2014  . Metabolic syndrome 06/13/2014     Prior to Admission medications   Medication Sig Start Date End Date Taking? Authorizing Provider  fenofibrate (TRICOR) 145 MG tablet Take 1 tablet (145 mg total) by mouth daily. 04/14/17  Yes Preslyn Warr, PA-C  fluticasone (FLONASE) 50 MCG/ACT nasal spray Place 2 sprays into both nostrils daily. 08/12/17  Yes Merion Grimaldo, PA-C  lisinopril (PRINIVIL,ZESTRIL) 20 MG tablet Take 1 tablet (20 mg total) by mouth daily. 02/12/17  Yes Ahmani Daoud, PA-C  metFORMIN (GLUCOPHAGE) 500 MG tablet TAKE 1 TABLET TWICE A DAY WITH A MEAL Patient taking differently: Take 500mg  by mouth once daily at night 06/23/17  Yes Courtenay Creger, PA-C  NONFORMULARY OR COMPOUNDED ITEM Apply 120 g topically 4 (four) times daily. Diclofenac 3% Baclofen 2%, Bupivacaine 1%, Gabapentin 6% Ibuprofen 3%, Pentoxifylline 3% 11/19/17  Yes Vivi Barrack, DPM  cephALEXin (KEFLEX) 500 MG capsule Take 1 capsule (500 mg total) by mouth 3 (three) times daily. Patient not taking: Reported on 12/02/2017 10/16/17   Park Liter, DPM  meloxicam (MOBIC) 15 MG tablet Take 1 tablet (15 mg total) by mouth daily. Patient not taking: Reported on 12/02/2017 08/12/17   Porfirio Oar, PA-C  ondansetron (ZOFRAN) 4 MG tablet Take 1 tablet (4 mg total) by mouth every 8 (eight) hours as needed for nausea or vomiting. Patient not taking: Reported on 12/02/2017  09/24/17   Park LiterPrice, Michael J, DPM  oxyCODONE-acetaminophen (PERCOCET/ROXICET) 5-325 MG tablet Take 1 tablet every 6 (six) hours as needed by mouth for severe pain. Patient not taking: Reported on 12/02/2017 10/27/17   Berna Bueonnor, Chelsea A, MD     Allergies  Allergen Reactions  . Doxycycline Hives  . Erythromycin Base Hives        Objective:  Physical Exam  Constitutional: He is oriented to person, place, and time. He appears well-developed and well-nourished. He is active and cooperative. No distress.  BP 118/82 (BP Location: Left Arm, Patient Position: Sitting, Cuff Size: Large)   Pulse 87   Temp 98.4 F (36.9 C) (Oral)   Resp 18   Ht 5\' 7"  (1.702 m)   Wt 250 lb (113.4 kg)   SpO2 94%   BMI 39.16 kg/m   HENT:  Head: Normocephalic and atraumatic.  Right Ear: Hearing normal.  Left Ear: Hearing normal.  Eyes: Conjunctivae are normal. No scleral icterus.  Neck: Normal range of motion. Neck supple. No thyromegaly present.  Cardiovascular: Normal rate, regular rhythm and normal heart sounds.  Pulses:      Radial pulses are 2+ on the right side, and 2+ on the left side.  Pulmonary/Chest: Effort normal and breath sounds normal.  Lymphadenopathy:       Head (right side): No tonsillar, no preauricular, no posterior auricular and no occipital adenopathy present.       Head (left side): No tonsillar, no preauricular, no posterior auricular and no occipital adenopathy present.    He has no cervical adenopathy.       Right: No supraclavicular adenopathy present.       Left: No supraclavicular adenopathy present.  Neurological: He is alert and oriented to person, place, and time. No sensory deficit.  Skin: Skin is warm, dry and intact. No rash noted. No cyanosis or erythema. Nails show no clubbing.  Psychiatric: He has a normal mood and affect. His speech is normal and behavior is normal.       Wt Readings from Last 3 Encounters:  12/02/17 250 lb (113.4 kg)  08/22/17 250 lb 9.6 oz (113.7 kg)  08/12/17 249 lb (112.9 kg)       Assessment & Plan:   Problem List Items Addressed This Visit    Benign essential HTN    Well controlled. Continue lisinopril 20 mg.      Relevant Orders   Comprehensive metabolic panel (Completed)   TSH   Hyperlipidemia    Unclear why he is not on a statin. Await labs.  Adjust regimen as indicated by results. If LDL >70, plan to start statin therapy.      Relevant Orders   Comprehensive metabolic panel (Completed)   Anemia    Update CBC today.      Class 2 obesity without serious comorbidity with body mass index (BMI) of 39.0 to 39.9 in adult    Healthy eating and regular exercise encouraged.       Relevant Medications   metFORMIN (GLUMETZA) 1000 MG (MOD) 24 hr tablet   Well controlled type 2 diabetes mellitus (HCC) - Primary    Has been well controlled, A1C 6.6% last month. Change metformin to extended release product.      Relevant Medications   metFORMIN (GLUMETZA) 1000 MG (MOD) 24 hr tablet   Other Relevant Orders   Comprehensive metabolic panel (Completed)    Other Visit Diagnoses    Feeling jittery       Relevant Orders  TSH       Return in about 3 months (around 03/02/2018) for re-evaluation of diabetes, blood pressure, cholesterol, weight.   Fernande Brashelle S. Shan Valdes, PA-C [facility] at [facility]

## 2017-12-03 ENCOUNTER — Telehealth: Payer: Self-pay

## 2017-12-03 DIAGNOSIS — E119 Type 2 diabetes mellitus without complications: Secondary | ICD-10-CM

## 2017-12-03 LAB — COMPREHENSIVE METABOLIC PANEL
ALK PHOS: 75 IU/L (ref 39–117)
ALT: 22 IU/L (ref 0–44)
AST: 18 IU/L (ref 0–40)
Albumin/Globulin Ratio: 1.7 (ref 1.2–2.2)
Albumin: 4.8 g/dL (ref 3.5–5.5)
BUN/Creatinine Ratio: 17 (ref 9–20)
BUN: 18 mg/dL (ref 6–24)
Bilirubin Total: <0.2 mg/dL (ref 0.0–1.2)
CALCIUM: 9.6 mg/dL (ref 8.7–10.2)
CO2: 22 mmol/L (ref 20–29)
CREATININE: 1.06 mg/dL (ref 0.76–1.27)
Chloride: 106 mmol/L (ref 96–106)
GFR calc Af Amer: 96 mL/min/{1.73_m2} (ref >59)
GFR, EST NON AFRICAN AMERICAN: 83 mL/min/{1.73_m2} (ref >59)
GLOBULIN, TOTAL: 2.8 g/dL (ref 1.5–4.5)
GLUCOSE: 116 mg/dL — AB (ref 65–99)
Potassium: 4.7 mmol/L (ref 3.5–5.2)
SODIUM: 145 mmol/L — AB (ref 134–144)
Total Protein: 7.6 g/dL (ref 6.0–8.5)

## 2017-12-03 LAB — TSH: TSH: 1.3 u[IU]/mL (ref 0.450–4.500)

## 2017-12-03 NOTE — Telephone Encounter (Signed)
Attempted filling out a PA for patient's Metformin ER, however, insurance was inactive.  Checked this with cover my meds and the local pharmacy.  I will notify patient and find out if he has any new insurance. Called patient and he told me that he still has insurance but he is on a leave from work for surgery so they are not honoring it right now..  He said that he called them today and they said they will have it fixed by tomorrow. I will call back tomorrow to try to initiate his PA.

## 2017-12-04 ENCOUNTER — Ambulatory Visit (INDEPENDENT_AMBULATORY_CARE_PROVIDER_SITE_OTHER): Payer: BLUE CROSS/BLUE SHIELD | Admitting: Podiatry

## 2017-12-04 ENCOUNTER — Encounter: Payer: Self-pay | Admitting: Podiatry

## 2017-12-04 ENCOUNTER — Telehealth: Payer: Self-pay | Admitting: *Deleted

## 2017-12-04 DIAGNOSIS — Z9889 Other specified postprocedural states: Secondary | ICD-10-CM

## 2017-12-04 NOTE — Telephone Encounter (Signed)
Pt states he needs clinicals and paperwork sent to [facility] for his return to work, Dr. Al CorpusHyatt stated he could return to work 12/18/2017.

## 2017-12-04 NOTE — Telephone Encounter (Signed)
Received form today from patient's insurance company for completion.  Left it in Chelle's box at 104 to be signed and faxed back by her assistant.

## 2017-12-05 ENCOUNTER — Encounter: Payer: Self-pay | Admitting: Physician Assistant

## 2017-12-05 NOTE — Telephone Encounter (Signed)
Signed and returned to Prior Authorizations desk

## 2017-12-08 NOTE — Telephone Encounter (Signed)
Faxed

## 2017-12-12 NOTE — Telephone Encounter (Signed)
Received a fax requesting additional information.  Completed, signed by Avelino Leedshelle, and faxed.

## 2017-12-14 NOTE — Progress Notes (Signed)
  Subjective:  Patient ID: Walter MalesIsaiah Hemler Jr., male    DOB: [birth year],  MRN: [id number]  Chief Complaint  Patient presents with  . Routine Post Op    POV - DOS 09-24-17  Scarf/Akin bunionectomy left  "Its okay. I get these sharp pains"   DOS: 09/24/17 Procedure: L Scarf/Akin Bunionectomy.  46 y.o. male returns for post-op check. Denies N/V/F/Ch.  States he occasionally gets these sharp pains..  Objective:   General AA&O x3. Normal mood and affect.  Vascular Foot warm and well perfused.  Neurologic Gross sensation intact.  Dermatologic Skin healing well without signs of infection. Skin edges well coapted without signs of infection.  Small area overlying scab without any drainage or  Orthopedic: Slight tenderness to palpation noted about the surgical site.    Assessment & Plan:  Patient was evaluated and treated and all questions answered.  S/p scarf Akin bunionectomy -Progressing as expected post-operatively. -Well-healed incision with small area overlying scab -Medications refilled: none -Foot redressed.  No Follow-up on file.

## 2017-12-14 NOTE — Progress Notes (Signed)
  Subjective:  Patient ID: Walter MalesIsaiah Swiatek Jr., male    DOB: 05/06/1971,  MRN: [id number]  Chief Complaint  Patient presents with  . Routine Post Op    doing well, some pain continues on the bottom of his foot with swelling   DOS: 09/24/17 PROCEDURE: L Scarf/Akin Bunionectomy  46 y.o. male returns for post-op check. Denies N/V/F/Ch. Pain is controlled with current medications.  Objective:   General AA&O x3. Normal mood and affect.  Vascular Foot warm and well perfused.  Neurologic Gross sensation intact.  Dermatologic Skin well-healed without signs of infection  Orthopedic: Tenderness to palpation noted about the surgical site.   Assessment & Plan:  Patient was evaluated and treated and all questions answered.  S/p scarf Akin bunionectomy left -Progressing as expected post-operatively. -XR taken and reviewed healing osteotomy without evidence of hardware failure. -Transition to surgical shoe. WBAT.  Return in about 1 week (around 10/30/2017) for Post-op.

## 2017-12-15 NOTE — Telephone Encounter (Signed)
Checked on status.  Still awaiting result.  [facility]

## 2017-12-16 NOTE — Assessment & Plan Note (Signed)
Healthy eating and regular exercise encouraged.

## 2017-12-16 NOTE — Assessment & Plan Note (Signed)
Update CBC today. 

## 2017-12-16 NOTE — Assessment & Plan Note (Signed)
Unclear why he is not on a statin. Await labs. Adjust regimen as indicated by results. If LDL >70, plan to start statin therapy.

## 2017-12-16 NOTE — Assessment & Plan Note (Signed)
Well controlled. Continue lisinopril 20 mg.

## 2017-12-16 NOTE — Assessment & Plan Note (Addendum)
Has been well controlled, A1C 6.6% last month. Change metformin to extended release product.

## 2017-12-19 MED ORDER — METFORMIN HCL ER 500 MG PO TB24: 1000.0000 mg | ORAL_TABLET | Freq: Every day | ORAL | 3 refills | Status: DC

## 2017-12-19 NOTE — Addendum Note (Signed)
Addended by: Oneida AlarPETERSON, Huyen Perazzo on: 12/19/2017 03:21 PM   Modules accepted: Orders

## 2017-12-19 NOTE — Telephone Encounter (Signed)
Incoming fax received for Metformin 1000mg  ER- prior auth denied. [facility], prescription changed to Metformin 500mg  ER 500mg  take two tablets daily by mouth. Prescription sent to [facility] pharmacy.

## 2018-01-01 ENCOUNTER — Encounter: Payer: BLUE CROSS/BLUE SHIELD | Admitting: Podiatry

## 2018-01-04 NOTE — Progress Notes (Signed)
Encounter created in error. Disregard.

## 2018-01-04 NOTE — Progress Notes (Signed)
  Subjective:  Patient ID: Walter MalesIsaiah Burgeson Jr., male    DOB: [birth year],  MRN: [id number]  No chief complaint on file.   DOS: 09/24/17 Procedure: L Scarf/Akin Bunionectomy  47 y.o. male returns for post-op check.  States that he twisted his left ankle but is no longer hurting.  Is doing physical therapy with noted improvement.  Would like to go back to work.  States he is still having some pain along the scar however states that overall his pain is less than it was prior to surgery.  Objective:   General AA&O x3. Normal mood and affect.  Vascular Foot warm and well perfused.  Neurologic Gross sensation intact.  Dermatologic Skin healing well without signs of infection. Skin edges well coapted without signs of infection.  Orthopedic:  Slight tenderness to palpation located along the incision    Assessment & Plan:  Patient was evaluated and treated and all questions answered.  S/p left scarf Akin bunionectomy -Progressing as expected post-operatively. -We will write a note allowing patient to return to work without restriction -Continue PT to maximal medical improvement.  New cross-sectional scar massage.  F/u in 4 weeks.

## 2018-03-03 ENCOUNTER — Encounter: Payer: Self-pay | Admitting: Physician Assistant

## 2018-03-03 ENCOUNTER — Other Ambulatory Visit: Payer: Self-pay

## 2018-03-03 ENCOUNTER — Ambulatory Visit (INDEPENDENT_AMBULATORY_CARE_PROVIDER_SITE_OTHER): Payer: BLUE CROSS/BLUE SHIELD | Admitting: Physician Assistant

## 2018-03-03 VITALS — BP 118/72 | HR 88 | Temp 98.3°F | Resp 16 | Ht 67.0 in | Wt 256.8 lb

## 2018-03-03 DIAGNOSIS — I10 Essential (primary) hypertension: Secondary | ICD-10-CM

## 2018-03-03 DIAGNOSIS — R718 Other abnormality of red blood cells: Secondary | ICD-10-CM | POA: Diagnosis not present

## 2018-03-03 DIAGNOSIS — E785 Hyperlipidemia, unspecified: Secondary | ICD-10-CM | POA: Diagnosis not present

## 2018-03-03 DIAGNOSIS — E119 Type 2 diabetes mellitus without complications: Secondary | ICD-10-CM

## 2018-03-03 MED ORDER — GLUCOSE BLOOD VI STRP: ORAL_STRIP | 99 refills | Status: DC

## 2018-03-03 MED ORDER — LANCETS MISC: 99 refills | Status: DC

## 2018-03-03 NOTE — Progress Notes (Signed)
Patient ID: Walter Arth., male    DOB: 07-Aug-1971, 47 y.o.   MRN: [id number]  PCP: Porfirio Oar, PA-C  Chief Complaint  Patient presents with  . Diabetes    follow up   . Hyperlipidemia    follow up   . Hypertension    follow up     Subjective:   Presents for evaluation of diabetes, HTN, hyperlipidemia.  Last labs: A1C 6.6% (10/2017) LDL 118 (07/2017)  Hasn't taken fenofibrate in a coup[le of weeks. Ran out. Notes that he feels a lot better (bones and joints don't hurt as much) without it.  He's back in school, taking HVAC and carpentry courses. His wife is in a Engineer, building services. 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 Middle son has moved back home to save money in preparation for his marriage later this year.  Thinks he's gained some weight back. Has not been making the healthy eating choices that he was previously. Did not get scheduled with medical weight management due to his work schedule.   Review of Systems As above. No CP, SOB, HA, Dizziness, polydipsia, polyuria.    Patient Active Problem List   Diagnosis Date Noted  . Class 2 obesity without serious comorbidity with body mass index (BMI) of 39.0 to 39.9 in adult 09/03/2017  . Well controlled type 2 diabetes mellitus (HCC) 09/03/2017  . Anemia 04/14/2017  . Paresthesia 02/12/2017  . Hyperlipidemia 05/02/2016  . RBC microcytosis 05/02/2016  . OSA (obstructive sleep apnea) 09/06/2015  . Benign essential HTN 09/06/2015  . BMI 39.0-39.9,adult 09/06/2015  . Metabolic syndrome 06/13/2014    Prior to Admission medications   Medication Sig Start Date End Date Taking? Authorizing Provider  fenofibrate (TRICOR) 145 MG tablet Take 1 tablet (145 mg total) by mouth daily. 04/14/17  Yes Chayson Charters, PA-C  fluticasone (FLONASE) 50 MCG/ACT nasal spray Place 2 sprays into both nostrils daily. 08/12/17  Yes Lesia Monica, PA-C  lisinopril (PRINIVIL,ZESTRIL) 20 MG tablet Take 1  tablet (20 mg total) by mouth daily. 02/12/17  Yes Mindy Gali, PA-C  metFORMIN (GLUCOPHAGE-XR) 500 MG 24 hr tablet Take 2 tablets (1,000 mg total) by mouth daily with breakfast. 12/19/17  Yes Ursala Cressy, PA-C    Allergies  Allergen Reactions  . Doxycycline Hives  . Erythromycin Base Hives       Objective:  Physical Exam  Constitutional: He is oriented to person, place, and time. He appears well-developed and well-nourished. He is active and cooperative. No distress.  BP 118/72   Pulse 88   Temp 98.3 F (36.8 C)   Resp 16   Ht 5\' 7"  (1.702 m)   Wt 256 lb 12.8 oz (116.5 kg)   SpO2 96%   BMI 40.22 kg/m   HENT:  Head: Normocephalic and atraumatic.  Right Ear: Hearing normal.  Left Ear: Hearing normal.  Eyes: Conjunctivae are normal. No scleral icterus.  Neck: Normal range of motion. Neck supple. No thyromegaly present.  Cardiovascular: Normal rate, regular rhythm and normal heart sounds.  Pulses:      Radial pulses are 2+ on the right side, and 2+ on the left side.  Pulmonary/Chest: Effort normal and breath sounds normal.  Lymphadenopathy:       Head (right side): No tonsillar, no preauricular, no posterior auricular and no occipital adenopathy present.       Head (left side): No tonsillar, no preauricular, no posterior auricular and no occipital adenopathy present.  He has no cervical adenopathy.       Right: No supraclavicular adenopathy present.       Left: No supraclavicular adenopathy present.  Neurological: He is alert and oriented to person, place, and time. No sensory deficit.  Skin: Skin is warm, dry and intact. No rash noted. No cyanosis or erythema. Nails show no clubbing.  Psychiatric: He has a normal mood and affect. His speech is normal and behavior is normal.       Wt Readings from Last 3 Encounters:  03/03/18 256 lb 12.8 oz (116.5 kg)  12/02/17 250 lb (113.4 kg)  08/22/17 250 lb 9.6 oz (113.7 kg)       Assessment & Plan:   Problem List  Items Addressed This Visit    Benign essential HTN    Well-controlled.  Continue lisinopril 20 mg daily.  Encouraged healthy eating habits and increased exercise.  Weight loss will help.      Relevant Orders   Comprehensive metabolic panel (Completed)   Hyperlipidemia    Update lipid profile today.  Goal LDL is less than 70.  Not currently on a statin.  Did not tolerate fenofibrate due to muscle pain.      Relevant Orders   Comprehensive metabolic panel (Completed)   Lipid panel (Completed)   RBC microcytosis    Has been stable.  Update CBC.      Relevant Orders   CBC with Differential/Platelet (Completed)   Well controlled type 2 diabetes mellitus (HCC) - Primary    Continue healthy lifestyle modifications.  Update hemoglobin A1c today.      Relevant Medications   glucose blood test strip   Lancets MISC   Other Relevant Orders   Hemoglobin A1c (Completed)   Comprehensive metabolic panel (Completed)   Microalbumin / creatinine urine ratio (Completed)   For home use only DME Glucometer       Return in about 3 months (around 06/03/2018) for re-evaluation of diabetes, blood pressure, cholesterol.   Fernande Brashelle S. Tamya Denardo, PA-C Primary Care at [facility]

## 2018-03-03 NOTE — Patient Instructions (Addendum)
Schedule your eye exam!  When I see your cholesterol, we'll see what we need to do, I'll plan to try something different for your cholesterol, since the fenofibrate seems to make your bones and joints ache.    IF you received an x-ray today, you will receive an invoice from [facility]. Please contact [facility] at [phone] with questions or concerns regarding your invoice.   IF you received labwork today, you will receive an invoice from [facility]. Please contact [facility] at [phone] with questions or concerns regarding your invoice.   Our billing staff will not be able to assist you with questions regarding bills from these companies.  You will be contacted with the lab results as soon as they are available. The fastest way to get your results is to activate your My Chart account. Instructions are located on the last page of this paperwork. If you have not heard from us regarding the results in 2 weeks, please contact this office.

## 2018-03-04 LAB — CBC WITH DIFFERENTIAL/PLATELET
BASOS: 0 %
Basophils Absolute: 0 10*3/uL (ref 0.0–0.2)
EOS (ABSOLUTE): 0.2 10*3/uL (ref 0.0–0.4)
EOS: 2 %
HEMATOCRIT: 42.6 % (ref 37.5–51.0)
Hemoglobin: 13.4 g/dL (ref 13.0–17.7)
IMMATURE GRANS (ABS): 0 10*3/uL (ref 0.0–0.1)
IMMATURE GRANULOCYTES: 0 %
LYMPHS: 36 %
Lymphocytes Absolute: 2.5 10*3/uL (ref 0.7–3.1)
MCH: 23.8 pg — ABNORMAL LOW (ref 26.6–33.0)
MCHC: 31.5 g/dL (ref 31.5–35.7)
MCV: 76 fL — ABNORMAL LOW (ref 79–97)
MONOS ABS: 0.8 10*3/uL (ref 0.1–0.9)
Monocytes: 11 %
NEUTROS PCT: 51 %
Neutrophils Absolute: 3.5 10*3/uL (ref 1.4–7.0)
PLATELETS: 281 10*3/uL (ref 150–379)
RBC: 5.62 x10E6/uL (ref 4.14–5.80)
RDW: 15.5 % — AB (ref 12.3–15.4)
WBC: 6.9 10*3/uL (ref 3.4–10.8)

## 2018-03-04 LAB — COMPREHENSIVE METABOLIC PANEL
ALK PHOS: 71 IU/L (ref 39–117)
ALT: 34 IU/L (ref 0–44)
AST: 29 IU/L (ref 0–40)
Albumin/Globulin Ratio: 1.7 (ref 1.2–2.2)
Albumin: 4.5 g/dL (ref 3.5–5.5)
BUN/Creatinine Ratio: 16 (ref 9–20)
BUN: 15 mg/dL (ref 6–24)
CHLORIDE: 104 mmol/L (ref 96–106)
CO2: 24 mmol/L (ref 20–29)
Calcium: 9.1 mg/dL (ref 8.7–10.2)
Creatinine, Ser: 0.93 mg/dL (ref 0.76–1.27)
GFR calc Af Amer: 112 mL/min/{1.73_m2} (ref >59)
GFR calc non Af Amer: 97 mL/min/{1.73_m2} (ref >59)
GLUCOSE: 99 mg/dL (ref 65–99)
Globulin, Total: 2.7 g/dL (ref 1.5–4.5)
Potassium: 4.4 mmol/L (ref 3.5–5.2)
Sodium: 143 mmol/L (ref 134–144)
TOTAL PROTEIN: 7.2 g/dL (ref 6.0–8.5)

## 2018-03-04 LAB — LIPID PANEL
CHOLESTEROL TOTAL: 217 mg/dL — AB (ref 100–199)
Chol/HDL Ratio: 7.2 ratio — ABNORMAL HIGH (ref 0.0–5.0)
HDL: 30 mg/dL — ABNORMAL LOW (ref >39)
LDL Calculated: 141 mg/dL — ABNORMAL HIGH (ref 0–99)
TRIGLYCERIDES: 229 mg/dL — AB (ref 0–149)
VLDL CHOLESTEROL CAL: 46 mg/dL — AB (ref 5–40)

## 2018-03-04 LAB — MICROALBUMIN / CREATININE URINE RATIO
Creatinine, Urine: 145.1 mg/dL
Microalb/Creat Ratio: 2.7 mg/g creat (ref 0.0–30.0)
Microalbumin, Urine: 3.9 ug/mL

## 2018-03-04 LAB — HEMOGLOBIN A1C
Est. average glucose Bld gHb Est-mCnc: 151 mg/dL
Hgb A1c MFr Bld: 6.9 % — ABNORMAL HIGH (ref 4.8–5.6)

## 2018-03-10 ENCOUNTER — Encounter: Payer: Self-pay | Admitting: Physician Assistant

## 2018-03-10 MED ORDER — ATORVASTATIN CALCIUM 20 MG PO TABS: 20.0000 mg | ORAL_TABLET | Freq: Every day | ORAL | 3 refills | Status: DC

## 2018-03-19 ENCOUNTER — Encounter: Payer: Self-pay | Admitting: Physician Assistant

## 2018-03-25 ENCOUNTER — Encounter: Payer: Self-pay | Admitting: Physician Assistant

## 2018-03-26 ENCOUNTER — Telehealth: Payer: Self-pay | Admitting: Physician Assistant

## 2018-03-26 NOTE — Telephone Encounter (Signed)
Called and spoke with pt about Chelle leaving. Pt chose to make an apt with Dr. Alvy BimlerSagardia on 06/10/18. I advised of building number, time and late policy. I also advised pt that he would be receiving a letter from PCP.

## 2018-04-07 ENCOUNTER — Other Ambulatory Visit: Payer: Self-pay | Admitting: Physician Assistant

## 2018-04-07 DIAGNOSIS — I10 Essential (primary) hypertension: Secondary | ICD-10-CM

## 2018-04-08 ENCOUNTER — Ambulatory Visit (INDEPENDENT_AMBULATORY_CARE_PROVIDER_SITE_OTHER): Payer: BLUE CROSS/BLUE SHIELD | Admitting: Physician Assistant

## 2018-04-08 ENCOUNTER — Encounter: Payer: Self-pay | Admitting: Physician Assistant

## 2018-04-08 ENCOUNTER — Other Ambulatory Visit: Payer: Self-pay

## 2018-04-08 VITALS — BP 120/84 | HR 80 | Temp 98.4°F | Resp 18 | Ht 67.0 in | Wt 253.2 lb

## 2018-04-08 DIAGNOSIS — L600 Ingrowing nail: Secondary | ICD-10-CM | POA: Insufficient documentation

## 2018-04-08 DIAGNOSIS — Z6839 Body mass index (BMI) 39.0-39.9, adult: Secondary | ICD-10-CM

## 2018-04-08 NOTE — Assessment & Plan Note (Signed)
With diabetes, HTN and hyperlipidemia, this is morbid obesity. He is ready for help. Re-referred to [facility].

## 2018-04-08 NOTE — Progress Notes (Signed)
Patient ID: Walter Reynolds., male    DOB: 1971-05-17, 47 y.o.   MRN: [id number]  PCP: Porfirio Oar, PA-C  Chief Complaint  Patient presents with  . Nail Problem    left foot, big toe, x1 week, pt states he tried to cut nail down himself because he had to work.      Subjective:   Presents for evaluation of ingrowing nail of the LEFT great toe.  Long standing ingrowing nails on the great toes. He "doctors" them at home by trimming the sharp corners. Recent redness and purulent drainage has resolved, but he still has tenderness at the lateral corner on the LEFT. The RIGHT isn't as bad. Has had multiple wedge excisions, with recurrence. The last wedge resection, 11/2013, was so painful that he has not wanted to have it redone.  Also is disturbed by the rise in A1C noted at his visit last month. He has not been able to resume healthier eating habits that had helped in the past. He previously did not go to [facility] as recommended, but is ready now.  Saw his eye specialist last month. Experiencing watery eyes, and reports the specialist "didn't see anything."   Review of Systems As above.    Patient Active Problem List   Diagnosis Date Noted  . Severe obesity (BMI >= 40) (HCC) 09/03/2017  . Well controlled type 2 diabetes mellitus (HCC) 09/03/2017  . Anemia 04/14/2017  . Paresthesia 02/12/2017  . Hyperlipidemia 05/02/2016  . RBC microcytosis 05/02/2016  . OSA (obstructive sleep apnea) 09/06/2015  . Benign essential HTN 09/06/2015  . Metabolic syndrome 06/13/2014     Prior to Admission medications   Medication Sig Start Date End Date Taking? Authorizing Provider  atorvastatin (LIPITOR) 20 MG tablet Take 1 tablet (20 mg total) by mouth daily. 03/10/18  Yes Perl Kerney, PA-C  fluticasone (FLONASE) 50 MCG/ACT nasal spray Place 2 sprays into both nostrils daily. 08/12/17  Yes Caela Huot, PA-C  glucose blood test strip Use as instructed 03/03/18  Yes Jolisa Intriago,  Dennies Coate, PA-C  Lancets MISC Use for home glucose monitoring, daily 03/03/18  Yes Areeb Corron, PA-C  lisinopril (PRINIVIL,ZESTRIL) 20 MG tablet TAKE 1 TABLET BY MOUTH EVERY DAY 04/07/18  Yes Galit Urich, PA-C  meloxicam (MOBIC) 15 MG tablet Take 15 mg by mouth daily as needed. 01/15/18  Yes [provider]  metFORMIN (GLUCOPHAGE-XR) 500 MG 24 hr tablet Take 2 tablets (1,000 mg total) by mouth daily with breakfast. 12/19/17  Yes Allen Basista, PA-C  fenofibrate (TRICOR) 145 MG tablet Take 1 tablet (145 mg total) by mouth daily. Patient not taking: Reported on 04/08/2018 04/14/17   Porfirio Oar, PA-C     Allergies  Allergen Reactions  . Doxycycline Hives  . Erythromycin Base Hives       Objective:  Physical Exam  Constitutional: He is oriented to person, place, and time. He appears well-developed and well-nourished. He is active and cooperative. No distress.  BP 120/84 (BP Location: Left Arm, Patient Position: Sitting, Cuff Size: Large)   Pulse 80   Temp 98.4 F (36.9 C) (Oral)   Resp 18   Ht 5\' 7"  (1.702 m)   Wt 253 lb 3.2 oz (114.9 kg)   SpO2 96%   BMI 39.66 kg/m    Eyes: Conjunctivae are normal.  Pulmonary/Chest: Effort normal.  Neurological: He is alert and oriented to person, place, and time.  Skin: Skin is warm, dry and intact.  LEFT great toenail is trimmed  very short. Evidence of recent ingrowing nail and edema. No current erythema, edema. No drainage or crusting. Tenderness of the lateral nail fold with palpation of the distal toe.  Psychiatric: He has a normal mood and affect. His speech is normal and behavior is normal.    Wt Readings from Last 3 Encounters:  04/08/18 253 lb 3.2 oz (114.9 kg)  03/03/18 256 lb 12.8 oz (116.5 kg)  12/02/17 250 lb (113.4 kg)        Assessment & Plan:   Problem List Items Addressed This Visit    BMI 39.0-39.9,adult    With diabetes, HTN and hyperlipidemia, this is morbid obesity. He is ready for help.  Re-referred to [facility].      Relevant Orders   Amb Ref to Medical Weight Management   Ingrowing toenail - Primary    Recurrent. Complicated by diabetes. Refer to podiatry for definitive treatment.      Relevant Orders   Ambulatory referral to Podiatry       No follow-ups on file.   Fernande Brashelle S. Patrisha Hausmann, PA-C Primary Care at [facility]

## 2018-04-08 NOTE — Assessment & Plan Note (Signed)
Recurrent. Complicated by diabetes. Refer to podiatry for definitive treatment.

## 2018-04-08 NOTE — Patient Instructions (Signed)
Try some natural tears (rewetting drops) to help with the sticky, draining eyes.      IF you received an x-ray today, you will receive an invoice from [facility]. Please contact [facility] at [phone] with questions or concerns regarding your invoice.   IF you received labwork today, you will receive an invoice from [facility]. Please contact [facility] at [phone] with questions or concerns regarding your invoice.   Our billing staff will not be able to assist you with questions regarding bills from these companies.  You will be contacted with the lab results as soon as they are available. The fastest way to get your results is to activate your My Chart account. Instructions are located on the last page of this paperwork. If you have not heard from us regarding the results in 2 weeks, please contact this office.

## 2018-04-09 ENCOUNTER — Ambulatory Visit (INDEPENDENT_AMBULATORY_CARE_PROVIDER_SITE_OTHER): Payer: BLUE CROSS/BLUE SHIELD | Admitting: Podiatry

## 2018-04-09 ENCOUNTER — Encounter: Payer: Self-pay | Admitting: Podiatry

## 2018-04-09 DIAGNOSIS — L03031 Cellulitis of right toe: Secondary | ICD-10-CM

## 2018-04-09 NOTE — Progress Notes (Signed)
Subjective:   Patient ID: Vicente MalesIsaiah Staff Jr., male   DOB: 47 y.o.   MRN: [id number]   HPI Patient presents stating he has had problems with his left hallux nail for a while and he saw pus recently and he is a diabetic and gets concerned.  States he is doing well from his surgery but does get occasional pain in his left big toe joint   ROS      Objective:  Physical Exam  Neurovascular status intact with scar along the medial side of the left first metatarsal with probable low-grade infection of the proximal portion which is resolved with good range of motion and irritation of the left hallux lateral border with crusted-like tissue and incurvation of the bed with slight distal redness but no active drainage or proximal edema erythema     Assessment:  Paronychial infection left hallux localized in nature with well-healed surgical site left first metatarsal and structural deformity right     Plan:  H&P condition reviewed with focus on this corner.  Today I went ahead and infiltrated 60 mg like Marcaine mixture and under sterile conditions after sterile prep of the area I did remove the lateral crusted tissue border and a lateral channel for drainage and explained soaks to the patient.  Will be seen back and ultimately may require pulmonary procedure and will see Dr. Samuella CotaPrice for consult concerning his right foot in the future

## 2018-04-09 NOTE — Patient Instructions (Signed)

## 2018-04-12 NOTE — Assessment & Plan Note (Signed)
Well-controlled.  Continue lisinopril 20 mg daily.  Encouraged healthy eating habits and increased exercise.  Weight loss will help.

## 2018-04-12 NOTE — Assessment & Plan Note (Signed)
Continue healthy lifestyle modifications.  Update hemoglobin A1c today.

## 2018-04-12 NOTE — Assessment & Plan Note (Signed)
Has been stable.  Update CBC.

## 2018-04-12 NOTE — Assessment & Plan Note (Signed)
Update lipid profile today.  Goal LDL is less than 70.  Not currently on a statin.  Did not tolerate fenofibrate due to muscle pain.

## 2018-06-04 ENCOUNTER — Encounter (INDEPENDENT_AMBULATORY_CARE_PROVIDER_SITE_OTHER): Payer: BLUE CROSS/BLUE SHIELD

## 2018-06-09 ENCOUNTER — Ambulatory Visit: Payer: BLUE CROSS/BLUE SHIELD | Admitting: Physician Assistant

## 2018-06-10 ENCOUNTER — Ambulatory Visit (INDEPENDENT_AMBULATORY_CARE_PROVIDER_SITE_OTHER): Payer: BLUE CROSS/BLUE SHIELD | Admitting: Emergency Medicine

## 2018-06-10 ENCOUNTER — Other Ambulatory Visit: Payer: Self-pay

## 2018-06-10 ENCOUNTER — Encounter: Payer: Self-pay | Admitting: Emergency Medicine

## 2018-06-10 VITALS — BP 122/84 | HR 84 | Temp 98.3°F | Resp 16 | Ht 66.5 in | Wt 252.0 lb

## 2018-06-10 DIAGNOSIS — E119 Type 2 diabetes mellitus without complications: Secondary | ICD-10-CM | POA: Diagnosis not present

## 2018-06-10 DIAGNOSIS — N529 Male erectile dysfunction, unspecified: Secondary | ICD-10-CM | POA: Diagnosis not present

## 2018-06-10 DIAGNOSIS — E785 Hyperlipidemia, unspecified: Secondary | ICD-10-CM | POA: Diagnosis not present

## 2018-06-10 DIAGNOSIS — I10 Essential (primary) hypertension: Secondary | ICD-10-CM | POA: Diagnosis not present

## 2018-06-10 LAB — POCT GLYCOSYLATED HEMOGLOBIN (HGB A1C): Hemoglobin A1C: 6.7 % — AB (ref 4.0–5.6)

## 2018-06-10 LAB — LIPID PANEL
Chol/HDL Ratio: 4.9 ratio (ref 0.0–5.0)
Cholesterol, Total: 147 mg/dL (ref 100–199)
HDL: 30 mg/dL — AB (ref >39)
LDL Calculated: 78 mg/dL (ref 0–99)
TRIGLYCERIDES: 195 mg/dL — AB (ref 0–149)
VLDL CHOLESTEROL CAL: 39 mg/dL (ref 5–40)

## 2018-06-10 LAB — GLUCOSE, POCT (MANUAL RESULT ENTRY): POC GLUCOSE: 145 mg/dL — AB (ref 70–99)

## 2018-06-10 MED ORDER — SILDENAFIL CITRATE 100 MG PO TABS: 50.0000 mg | ORAL_TABLET | Freq: Every day | ORAL | 11 refills | Status: DC | PRN

## 2018-06-10 NOTE — Progress Notes (Signed)
Walter Reynolds. 47 y.o.   Chief Complaint  Patient presents with  . Diabetes    follow up  3 months - former [facility] patient  . Hypertension    HISTORY OF PRESENT ILLNESS: This is a 47 y.o. male with history of hypertension and diabetes type 2, here for follow-up.  Compliant with medications.  No home blood pressure measurements.  Not compliant with glucose checks at home.  No medical concerns or complaints at this point.  HPI   Prior to Admission medications   Medication Sig Start Date End Date Taking? Authorizing Provider  atorvastatin (LIPITOR) 20 MG tablet Take 1 tablet (20 mg total) by mouth daily. 03/10/18  Yes Jeffery, Chelle, PA-C  lisinopril (PRINIVIL,ZESTRIL) 20 MG tablet TAKE 1 TABLET BY MOUTH EVERY DAY 04/07/18  Yes Jeffery, Chelle, PA-C  metFORMIN (GLUCOPHAGE-XR) 500 MG 24 hr tablet Take 2 tablets (1,000 mg total) by mouth daily with breakfast. 12/19/17  Yes Jeffery, Chelle, PA-C  fluticasone (FLONASE) 50 MCG/ACT nasal spray Place 2 sprays into both nostrils daily. Patient not taking: Reported on 06/10/2018 08/12/17   Porfirio Oar, PA-C  glucose blood test strip Use as instructed 03/03/18   Porfirio Oar, PA-C  Lancets MISC Use for home glucose monitoring, daily 03/03/18   Porfirio Oar, PA-C  meloxicam (MOBIC) 15 MG tablet Take 15 mg by mouth daily as needed. 01/15/18   [provider]    Allergies  Allergen Reactions  . Doxycycline Hives  . Erythromycin Base Hives    Patient Active Problem List   Diagnosis Date Noted  . Ingrowing toenail 04/08/2018  . BMI 39.0-39.9,adult 09/03/2017  . Well controlled type 2 diabetes mellitus (HCC) 09/03/2017  . Anemia 04/14/2017  . Paresthesia 02/12/2017  . Hyperlipidemia 05/02/2016  . RBC microcytosis 05/02/2016  . OSA (obstructive sleep apnea) 09/06/2015  . Benign essential HTN 09/06/2015  . Metabolic syndrome 06/13/2014    Past Medical History:  Diagnosis Date  . Allergy   . Diabetes mellitus without  complication (HCC)   . Elevated cholesterol   . Hernia    bi lateral hernia repair  . History of kidney stones   . Hypertension   . Sleep apnea 2014   severe  . Umbilical hernia without obstruction and without gangrene 09/03/2017    Past Surgical History:  Procedure Laterality Date  . CARPAL TUNNEL RELEASE Right 2007  . CARPAL TUNNEL RELEASE Left 10/28/2014   Procedure: LEFT CARPAL TUNNEL RELEASE;  Surgeon: Dairl Ponder, MD;  Location: [facility];  Service: Orthopedics;  Laterality: Left;  . FOOT SURGERY Left   . INGUINAL HERNIA REPAIR     times two, each side  . KIDNEY STONE SURGERY    . MASS EXCISION Left 10/28/2014   Procedure: LEFT WRIST VOLAR MASS EXCISION;  Surgeon: Dairl Ponder, MD;  Location: [facility];  Service: Orthopedics;  Laterality: Left;  . UMBILICAL HERNIA REPAIR N/A 10/27/2017   Procedure: HERNIA REPAIR UMBILICAL ADULT;  Surgeon: Berna Bue, MD;  Location: [facility] OR;  Service: General;  Laterality: N/A;    Social History   Socioeconomic History  . Marital status: Married    Spouse name: BERNICE MULLIN  . Number of children: 2  . Years of education: Associates  . Highest education level: Not on file  Occupational History  . Occupation: Merchandiser, retail    Comment: [employer]  Social Needs  . Financial resource strain: Not on file  . Food insecurity:    Worry: Not on  file    Inability: Not on file  . Transportation needs:    Medical: Not on file    Non-medical: Not on file  Tobacco Use  . Smoking status: Never Smoker  . Smokeless tobacco: Never Used  Substance and Sexual Activity  . Alcohol use: No    Alcohol/week: 0.0 oz  . Drug use: No  . Sexual activity: Yes    Partners: Female    Birth control/protection: Condom  Lifestyle  . Physical activity:    Days per week: Not on file    Minutes per session: Not on file  . Stress: Not on file  Relationships  . Social connections:    Talks on phone: Not on file     Gets together: Not on file    Attends religious service: Not on file    Active member of club or organization: Not on file    Attends meetings of clubs or organizations: Not on file    Relationship status: Not on file  . Intimate partner violence:    Fear of current or ex partner: Not on file    Emotionally abused: Not on file    Physically abused: Not on file    Forced sexual activity: Not on file  Other Topics Concern  . Not on file  Social History Narrative   Lives with his wife and their 2 children.   He has 3 sons from a previous relationship that live independently.   Formerly in the [employer].   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    Family History  Problem Relation Age of Onset  . Diabetes Mother   . Stroke Mother        4 strokes  . Heart attack Mother   . Heart disease Mother   . Hypertension Father   . Hypertension Sister   . Heart disease Sister        pacemaker     Review of Systems  Constitutional: Negative.  Negative for chills, fever and weight loss.  HENT: Negative for sore throat.   Eyes: Negative.  Negative for blurred vision and double vision.  Respiratory: Negative.  Negative for cough and shortness of breath.   Cardiovascular: Negative.  Negative for chest pain, palpitations and leg swelling.  Gastrointestinal: Negative.  Negative for abdominal pain, blood in stool, diarrhea, nausea and vomiting.  Genitourinary: Negative.  Negative for dysuria and hematuria.       Occasional ED.  Musculoskeletal: Negative.  Negative for back pain, myalgias and neck pain.  Skin: Negative.  Negative for rash.  Neurological: Negative.  Negative for dizziness, sensory change, focal weakness and headaches.  Endo/Heme/Allergies: Negative.   All other systems reviewed and are negative.   Vitals:   06/10/18 0800  BP: 122/84  Pulse: 84  Resp: 16  Temp: 98.3 F (36.8 C)    SpO2: 97%    Physical Exam  Constitutional: He is oriented to person, place, and time. He appears well-developed and well-nourished.  HENT:  Head: Normocephalic and atraumatic.  Nose: Nose normal.  Mouth/Throat: Oropharynx is clear and moist.  Eyes: Pupils are equal, round, and reactive to light. Conjunctivae and EOM are normal.  Neck: Normal range of motion. Neck supple. No JVD present.  Cardiovascular: Normal rate, regular rhythm and normal heart sounds.  Pulmonary/Chest: Effort normal and breath sounds normal.  Abdominal: Soft. Bowel sounds are normal. There is no tenderness.  Musculoskeletal: Normal range of motion.  Lymphadenopathy:    He has no cervical adenopathy.  Neurological: He is alert and oriented to person, place, and time. No sensory deficit. He exhibits normal muscle tone.  Skin: Skin is warm and dry. Capillary refill takes less than 2 seconds.  Psychiatric: He has a normal mood and affect. His behavior is normal.  Vitals reviewed.  Results for orders placed or performed in visit on 06/10/18 (from the past 24 hour(s))  POCT glycosylated hemoglobin (Hb A1C)     Status: Abnormal   Collection Time: 06/10/18  8:50 AM  Result Value Ref Range   Hemoglobin A1C 6.7 (A) 4.0 - 5.6 %   HbA1c POC (<> result, manual entry)  4.0 - 5.6 %   HbA1c, POC (prediabetic range)  5.7 - 6.4 %   HbA1c, POC (controlled diabetic range)  0.0 - 7.0 %  POCT glucose (manual entry)     Status: Abnormal   Collection Time: 06/10/18  8:51 AM  Result Value Ref Range   POC Glucose 145 (A) 70 - 99 mg/dl     Benign essential HTN Well-controlled.  Continue lisinopril 20 mg a day.  Advised to purchase blood pressure machine and take blood pressure at home at different times.  Advised about low-salt diet.  Type 2 diabetes mellitus without complication, without long-term current use of insulin (HCC) Diabetes fairly well controlled.  Hemoglobin A1c at 6.7.  We will continue present treatment.  Advised  about nutrition and physical activity.  Follow-up in 3 months.   ASSESSMENT & PLAN: Jacorie was seen today for diabetes and hypertension.  Diagnoses and all orders for this visit:  Type 2 diabetes mellitus without complication, without long-term current use of insulin (HCC) -     POCT glycosylated hemoglobin (Hb A1C) -     POCT glucose (manual entry)  Dyslipidemia -     Lipid panel  Benign essential HTN  Erectile dysfunction, unspecified erectile dysfunction type -     sildenafil (VIAGRA) 100 MG tablet; Take 0.5-1 tablets (50-100 mg total) by mouth daily as needed for erectile dysfunction.    Patient Instructions       IF you received an x-ray today, you will receive an invoice from [facility]. Please contact [facility] at [phone] with questions or concerns regarding your invoice.   IF you received labwork today, you will receive an invoice from [facility]. Please contact [facility] at [phone] with questions or concerns regarding your invoice.   Our billing staff will not be able to assist you with questions regarding bills from these companies.  You will be contacted with the lab results as soon as they are available. The fastest way to get your results is to activate your My Chart account. Instructions are located on the last page of this paperwork. If you have not heard from [place] regarding the results in 2 weeks, please contact this office.     Diabetes Mellitus and Nutrition When you have diabetes (diabetes mellitus), it is very important to have healthy eating habits because your blood sugar (glucose) levels are greatly affected by what you eat and drink. Eating healthy foods in the appropriate amounts, at about the same times every day, can help you:  Control your blood glucose.  Lower your risk of heart disease.  Improve your blood pressure.  Reach or maintain a healthy weight.  Every person with diabetes is different, and each person has  different needs for a meal plan. Your health  care provider may recommend that you work with a diet and nutrition specialist (dietitian) to make a meal plan that is best for you. Your meal plan may vary depending on factors such as:  The calories you need.  The medicines you take.  Your weight.  Your blood glucose, blood pressure, and cholesterol levels.  Your activity level.  Other health conditions you have, such as heart or kidney disease.  How do carbohydrates affect me? Carbohydrates affect your blood glucose level more than any other type of food. Eating carbohydrates naturally increases the amount of glucose in your blood. Carbohydrate counting is a method for keeping track of how many carbohydrates you eat. Counting carbohydrates is important to keep your blood glucose at a healthy level, especially if you use insulin or take certain oral diabetes medicines. It is important to know how many carbohydrates you can safely have in each meal. This is different for every person. Your dietitian can help you calculate how many carbohydrates you should have at each meal and for snack. Foods that contain carbohydrates include:  Bread, cereal, rice, pasta, and crackers.  Potatoes and corn.  Peas, beans, and lentils.  Milk and yogurt.  Fruit and juice.  Desserts, such as cakes, cookies, ice cream, and candy.  How does alcohol affect me? Alcohol can cause a sudden decrease in blood glucose (hypoglycemia), especially if you use insulin or take certain oral diabetes medicines. Hypoglycemia can be a life-threatening condition. Symptoms of hypoglycemia (sleepiness, dizziness, and confusion) are similar to symptoms of having too much alcohol. If your health care provider says that alcohol is safe for you, follow these guidelines:  Limit alcohol intake to no more than 1 drink per day for nonpregnant women and 2 drinks per day for men. One drink equals 12 oz of beer, 5 oz of wine, or 1 oz of  hard liquor.  Do not drink on an empty stomach.  Keep yourself hydrated with water, diet soda, or unsweetened iced tea.  Keep in mind that regular soda, juice, and other mixers may contain a lot of sugar and must be counted as carbohydrates.  What are tips for following this plan? Reading food labels  Start by checking the serving size on the label. The amount of calories, carbohydrates, fats, and other nutrients listed on the label are based on one serving of the food. Many foods contain more than one serving per package.  Check the total grams (g) of carbohydrates in one serving. You can calculate the number of servings of carbohydrates in one serving by dividing the total carbohydrates by 15. For example, if a food has 30 g of total carbohydrates, it would be equal to 2 servings of carbohydrates.  Check the number of grams (g) of saturated and trans fats in one serving. Choose foods that have low or no amount of these fats.  Check the number of milligrams (mg) of sodium in one serving. Most people should limit total sodium intake to less than 2,300 mg per day.  Always check the nutrition information of foods labeled as "low-fat" or "nonfat". These foods may be higher in added sugar or refined carbohydrates and should be avoided.  Talk to your dietitian to identify your daily goals for nutrients listed on the label. Shopping  Avoid buying canned, premade, or processed foods. These foods tend to be high in fat, sodium, and added sugar.  Shop around the outside edge of the grocery store. This includes fresh fruits and vegetables, bulk  grains, fresh meats, and fresh dairy. Cooking  Use low-heat cooking methods, such as baking, instead of high-heat cooking methods like deep frying.  Cook using healthy oils, such as olive, canola, or sunflower oil.  Avoid cooking with butter, cream, or high-fat meats. Meal planning  Eat meals and snacks regularly, preferably at the same times every  day. Avoid going long periods of time without eating.  Eat foods high in fiber, such as fresh fruits, vegetables, beans, and whole grains. Talk to your dietitian about how many servings of carbohydrates you can eat at each meal.  Eat 4-6 ounces of lean protein each day, such as lean meat, chicken, fish, eggs, or tofu. 1 ounce is equal to 1 ounce of meat, chicken, or fish, 1 egg, or 1/4 cup of tofu.  Eat some foods each day that contain healthy fats, such as avocado, nuts, seeds, and fish. Lifestyle   Check your blood glucose regularly.  Exercise at least 30 minutes 5 or more days each week, or as told by your health care provider.  Take medicines as told by your health care provider.  Do not use any products that contain nicotine or tobacco, such as cigarettes and e-cigarettes. If you need help quitting, ask your health care provider.  Work with a Veterinary surgeoncounselor or diabetes educator to identify strategies to manage stress and any emotional and social challenges. What are some questions to ask my health care provider?  Do I need to meet with a diabetes educator?  Do I need to meet with a dietitian?  What number can I call if I have questions?  When are the best times to check my blood glucose? Where to find more information:  American Diabetes Association: diabetes.org/food-and-fitness/food  Academy of Nutrition and Dietetics: https://www.vargas.com/www.eatright.org/resources/health/diseases-and-conditions/diabetes  [employer] Institute of Diabetes and Digestive and Kidney Diseases (NIH): FindJewelers.czwww.niddk.nih.gov/health-information/diabetes/overview/diet-eating-physical-activity Summary  A healthy meal plan will help you control your blood glucose and maintain a healthy lifestyle.  Working with a diet and nutrition specialist (dietitian) can help you make a meal plan that is best for you.  Keep in mind that carbohydrates and alcohol have immediate effects on your blood glucose levels. It is important to count  carbohydrates and to use alcohol carefully. This information is not intended to replace advice given to you by your health care provider. Make sure you discuss any questions you have with your health care provider. Document Released: 08/29/2005 Document Revised: 01/06/2017 Document Reviewed: 01/06/2017 Elsevier Interactive Patient Education  2018 [employer]      Edwina BarthMiguel Lachele Lievanos, MD [facility]

## 2018-06-10 NOTE — Assessment & Plan Note (Signed)
Well-controlled.  Continue lisinopril 20 mg a day.  Advised to purchase blood pressure machine and take blood pressure at home at different times.  Advised about low-salt diet.

## 2018-06-10 NOTE — Patient Instructions (Addendum)
   IF you received an x-ray today, you will receive an invoice from [facility]. Please contact  Radiology at [phone] with questions or concerns regarding your invoice.   IF you received labwork today, you will receive an invoice from [facility]. Please contact [facility] at [phone] with questions or concerns regarding your invoice.   Our billing staff will not be able to assist you with questions regarding bills from these companies.  You will be contacted with the lab results as soon as they are available. The fastest way to get your results is to activate your My Chart account. Instructions are located on the last page of this paperwork. If you have not heard from us regarding the results in 2 weeks, please contact this office.     Diabetes Mellitus and Nutrition When you have diabetes (diabetes mellitus), it is very important to have healthy eating habits because your blood sugar (glucose) levels are greatly affected by what you eat and drink. Eating healthy foods in the appropriate amounts, at about the same times every day, can help you:  Control your blood glucose.  Lower your risk of heart disease.  Improve your blood pressure.  Reach or maintain a healthy weight.  Every person with diabetes is different, and each person has different needs for a meal plan. Your health care provider may recommend that you work with a diet and nutrition specialist (dietitian) to make a meal plan that is best for you. Your meal plan may vary depending on factors such as:  The calories you need.  The medicines you take.  Your weight.  Your blood glucose, blood pressure, and cholesterol levels.  Your activity level.  Other health conditions you have, such as heart or kidney disease.  How do carbohydrates affect me? Carbohydrates affect your blood glucose level more than any other type of food. Eating carbohydrates naturally increases the amount of glucose in your  blood. Carbohydrate counting is a method for keeping track of how many carbohydrates you eat. Counting carbohydrates is important to keep your blood glucose at a healthy level, especially if you use insulin or take certain oral diabetes medicines. It is important to know how many carbohydrates you can safely have in each meal. This is different for every person. Your dietitian can help you calculate how many carbohydrates you should have at each meal and for snack. Foods that contain carbohydrates include:  Bread, cereal, rice, pasta, and crackers.  Potatoes and corn.  Peas, beans, and lentils.  Milk and yogurt.  Fruit and juice.  Desserts, such as cakes, cookies, ice cream, and candy.  How does alcohol affect me? Alcohol can cause a sudden decrease in blood glucose (hypoglycemia), especially if you use insulin or take certain oral diabetes medicines. Hypoglycemia can be a life-threatening condition. Symptoms of hypoglycemia (sleepiness, dizziness, and confusion) are similar to symptoms of having too much alcohol. If your health care provider says that alcohol is safe for you, follow these guidelines:  Limit alcohol intake to no more than 1 drink per day for nonpregnant women and 2 drinks per day for men. One drink equals 12 oz of beer, 5 oz of wine, or 1 oz of hard liquor.  Do not drink on an empty stomach.  Keep yourself hydrated with water, diet soda, or unsweetened iced tea.  Keep in mind that regular soda, juice, and other mixers may contain a lot of sugar and must be counted as carbohydrates.  What are tips for following   this plan? Reading food labels  Start by checking the serving size on the label. The amount of calories, carbohydrates, fats, and other nutrients listed on the label are based on one serving of the food. Many foods contain more than one serving per package.  Check the total grams (g) of carbohydrates in one serving. You can calculate the number of servings of  carbohydrates in one serving by dividing the total carbohydrates by 15. For example, if a food has 30 g of total carbohydrates, it would be equal to 2 servings of carbohydrates.  Check the number of grams (g) of saturated and trans fats in one serving. Choose foods that have low or no amount of these fats.  Check the number of milligrams (mg) of sodium in one serving. Most people should limit total sodium intake to less than 2,300 mg per day.  Always check the nutrition information of foods labeled as "low-fat" or "nonfat". These foods may be higher in added sugar or refined carbohydrates and should be avoided.  Talk to your dietitian to identify your daily goals for nutrients listed on the label. Shopping  Avoid buying canned, premade, or processed foods. These foods tend to be high in fat, sodium, and added sugar.  Shop around the outside edge of the grocery store. This includes fresh fruits and vegetables, bulk grains, fresh meats, and fresh dairy. Cooking  Use low-heat cooking methods, such as baking, instead of high-heat cooking methods like deep frying.  Cook using healthy oils, such as olive, canola, or sunflower oil.  Avoid cooking with butter, cream, or high-fat meats. Meal planning  Eat meals and snacks regularly, preferably at the same times every day. Avoid going long periods of time without eating.  Eat foods high in fiber, such as fresh fruits, vegetables, beans, and whole grains. Talk to your dietitian about how many servings of carbohydrates you can eat at each meal.  Eat 4-6 ounces of lean protein each day, such as lean meat, chicken, fish, eggs, or tofu. 1 ounce is equal to 1 ounce of meat, chicken, or fish, 1 egg, or 1/4 cup of tofu.  Eat some foods each day that contain healthy fats, such as avocado, nuts, seeds, and fish. Lifestyle   Check your blood glucose regularly.  Exercise at least 30 minutes 5 or more days each week, or as told by your health care  provider.  Take medicines as told by your health care provider.  Do not use any products that contain nicotine or tobacco, such as cigarettes and e-cigarettes. If you need help quitting, ask your health care provider.  Work with a counselor or diabetes educator to identify strategies to manage stress and any emotional and social challenges. What are some questions to ask my health care provider?  Do I need to meet with a diabetes educator?  Do I need to meet with a dietitian?  What number can I call if I have questions?  When are the best times to check my blood glucose? Where to find more information:  American Diabetes Association: diabetes.org/food-and-fitness/food  Academy of Nutrition and Dietetics: www.eatright.org/resources/health/diseases-and-conditions/diabetes  National Institute of Diabetes and Digestive and Kidney Diseases (NIH): www.niddk.nih.gov/health-information/diabetes/overview/diet-eating-physical-activity Summary  A healthy meal plan will help you control your blood glucose and maintain a healthy lifestyle.  Working with a diet and nutrition specialist (dietitian) can help you make a meal plan that is best for you.  Keep in mind that carbohydrates and alcohol have immediate effects on your blood glucose   levels. It is important to count carbohydrates and to use alcohol carefully. This information is not intended to replace advice given to you by your health care provider. Make sure you discuss any questions you have with your health care provider. Document Released: 08/29/2005 Document Revised: 01/06/2017 Document Reviewed: 01/06/2017 Elsevier Interactive Patient Education  2018 Elsevier Inc.  

## 2018-06-10 NOTE — Assessment & Plan Note (Signed)
Diabetes fairly well controlled.  Hemoglobin A1c at 6.7.  We will continue present treatment.  Advised about nutrition and physical activity.  Follow-up in 3 months.

## 2018-06-12 ENCOUNTER — Encounter: Payer: Self-pay | Admitting: Emergency Medicine

## 2018-06-29 ENCOUNTER — Ambulatory Visit (INDEPENDENT_AMBULATORY_CARE_PROVIDER_SITE_OTHER): Payer: BLUE CROSS/BLUE SHIELD | Admitting: Family Medicine

## 2018-07-23 ENCOUNTER — Emergency Department (HOSPITAL_COMMUNITY)
Admission: EM | Admit: 2018-07-23 | Discharge: 2018-07-23 | Disposition: A | Discharge status: Home / Self Care | Payer: BLUE CROSS/BLUE SHIELD | Attending: Emergency Medicine | Admitting: Emergency Medicine

## 2018-07-23 DIAGNOSIS — K0889 Other specified disorders of teeth and supporting structures: Secondary | ICD-10-CM | POA: Insufficient documentation

## 2018-07-23 DIAGNOSIS — Z5321 Procedure and treatment not carried out due to patient leaving prior to being seen by health care provider: Secondary | ICD-10-CM | POA: Diagnosis not present

## 2018-07-23 NOTE — ED Notes (Signed)
Pt was seen walking out of the emergency department with his family.

## 2018-07-23 NOTE — ED Triage Notes (Signed)
See downtime record

## 2018-07-27 NOTE — ED Notes (Signed)
Follow up call made  Pt followed up with his dentist  07/27/18   0913  s Denay Pleitez rn

## 2018-08-19 ENCOUNTER — Encounter (INDEPENDENT_AMBULATORY_CARE_PROVIDER_SITE_OTHER): Payer: BLUE CROSS/BLUE SHIELD

## 2018-08-31 ENCOUNTER — Ambulatory Visit (INDEPENDENT_AMBULATORY_CARE_PROVIDER_SITE_OTHER): Payer: BLUE CROSS/BLUE SHIELD | Admitting: Family Medicine

## 2018-09-09 ENCOUNTER — Ambulatory Visit (INDEPENDENT_AMBULATORY_CARE_PROVIDER_SITE_OTHER): Payer: BLUE CROSS/BLUE SHIELD | Admitting: Emergency Medicine

## 2018-09-09 ENCOUNTER — Other Ambulatory Visit: Payer: Self-pay

## 2018-09-09 ENCOUNTER — Encounter: Payer: Self-pay | Admitting: Emergency Medicine

## 2018-09-09 VITALS — BP 127/78 | HR 72 | Temp 97.9°F | Resp 16 | Ht 66.5 in | Wt 256.4 lb

## 2018-09-09 DIAGNOSIS — Z9109 Other allergy status, other than to drugs and biological substances: Secondary | ICD-10-CM | POA: Insufficient documentation

## 2018-09-09 DIAGNOSIS — Z23 Encounter for immunization: Secondary | ICD-10-CM | POA: Diagnosis not present

## 2018-09-09 DIAGNOSIS — I10 Essential (primary) hypertension: Secondary | ICD-10-CM

## 2018-09-09 DIAGNOSIS — E785 Hyperlipidemia, unspecified: Secondary | ICD-10-CM | POA: Diagnosis not present

## 2018-09-09 DIAGNOSIS — E119 Type 2 diabetes mellitus without complications: Secondary | ICD-10-CM | POA: Diagnosis not present

## 2018-09-09 LAB — POCT GLYCOSYLATED HEMOGLOBIN (HGB A1C): HEMOGLOBIN A1C: 6.3 % — AB (ref 4.0–5.6)

## 2018-09-09 LAB — GLUCOSE, POCT (MANUAL RESULT ENTRY): POC Glucose: 109 mg/dl — AB (ref 70–99)

## 2018-09-09 MED ORDER — FLUTICASONE PROPIONATE 50 MCG/ACT NA SUSP: 2.0000 | Freq: Every day | NASAL | 12 refills | Status: DC

## 2018-09-09 NOTE — Assessment & Plan Note (Signed)
Stable and well-controlled.  Continue present medication.  Follow-up in 6 months.

## 2018-09-09 NOTE — Progress Notes (Signed)
BP Readings from Last 3 Encounters:  09/09/18 127/78  06/10/18 122/84  04/08/18 120/84   Walter Reynolds. 47 y.o.   Chief Complaint  Patient presents with  . Diabetes    follow up and sinus congestion continous  . Hypertension    HISTORY OF PRESENT ILLNESS: This is a 47 y.o. male with history of hypertension diabetes and dyslipidemia here for follow-up. Also complaining of chronic sinus congestion.  Patient has a history of sleep apnea and uses CPAP intermittently.  Has been using machine without humidified air.  Using it without the water reservoir.  Oriented on how to properly use CPAP machine.  HPI   Prior to Admission medications   Medication Sig Start Date End Date Taking? Authorizing Provider  atorvastatin (LIPITOR) 20 MG tablet Take 1 tablet (20 mg total) by mouth daily. 03/10/18  Yes Jeffery, Chelle, PA  lisinopril (PRINIVIL,ZESTRIL) 20 MG tablet TAKE 1 TABLET BY MOUTH EVERY DAY 04/07/18  Yes Jeffery, Chelle, PA  meloxicam (MOBIC) 15 MG tablet Take 15 mg by mouth daily as needed. 01/15/18  Yes [provider]  metFORMIN (GLUCOPHAGE-XR) 500 MG 24 hr tablet Take 2 tablets (1,000 mg total) by mouth daily with breakfast. 12/19/17  Yes Jeffery, Chelle, PA  fluticasone (FLONASE) 50 MCG/ACT nasal spray Place 2 sprays into both nostrils daily. Patient not taking: Reported on 06/10/2018 08/12/17   Porfirio Oar, PA  glucose blood test strip Use as instructed 03/03/18   Porfirio Oar, PA  Lancets MISC Use for home glucose monitoring, daily 03/03/18   Porfirio Oar, PA  sildenafil (VIAGRA) 100 MG tablet Take 0.5-1 tablets (50-100 mg total) by mouth daily as needed for erectile dysfunction. Patient not taking: Reported on 09/09/2018 06/10/18   Georgina Quint, MD    Allergies  Allergen Reactions  . Doxycycline Hives  . Erythromycin Base Hives    Patient Active Problem List   Diagnosis Date Noted  . Ingrowing toenail 04/08/2018  . BMI 39.0-39.9,adult 09/03/2017    . Type 2 diabetes mellitus without complication, without long-term current use of insulin (HCC) 09/03/2017  . Anemia 04/14/2017  . Paresthesia 02/12/2017  . Hyperlipidemia 05/02/2016  . RBC microcytosis 05/02/2016  . OSA (obstructive sleep apnea) 09/06/2015  . Benign essential HTN 09/06/2015  . Metabolic syndrome 06/13/2014    Past Medical History:  Diagnosis Date  . Allergy   . Diabetes mellitus without complication (HCC)   . Elevated cholesterol   . Hernia    bi lateral hernia repair  . History of kidney stones   . Hypertension   . Sleep apnea 2014   severe  . Umbilical hernia without obstruction and without gangrene 09/03/2017    Past Surgical History:  Procedure Laterality Date  . CARPAL TUNNEL RELEASE Right 2007  . CARPAL TUNNEL RELEASE Left 10/28/2014   Procedure: LEFT CARPAL TUNNEL RELEASE;  Surgeon: Dairl Ponder, MD;  Location: [facility];  Service: Orthopedics;  Laterality: Left;  . FOOT SURGERY Left   . INGUINAL HERNIA REPAIR     times two, each side  . KIDNEY STONE SURGERY    . MASS EXCISION Left 10/28/2014   Procedure: LEFT WRIST VOLAR MASS EXCISION;  Surgeon: Dairl Ponder, MD;  Location: [facility];  Service: Orthopedics;  Laterality: Left;  . UMBILICAL HERNIA REPAIR N/A 10/27/2017   Procedure: HERNIA REPAIR UMBILICAL ADULT;  Surgeon: Berna Bue, MD;  Location: [facility] OR;  Service: General;  Laterality: N/A;    Social History  Socioeconomic History  . Marital status: Married    Spouse name: MARJORIE LUSSIER  . Number of children: 2  . Years of education: Associates  . Highest education level: Not on file  Occupational History  . Occupation: Merchandiser, retail    Comment: [employer]  Social Needs  . Financial resource strain: Not on file  . Food insecurity:    Worry: Not on file    Inability: Not on file  . Transportation needs:    Medical: Not on file    Non-medical: Not on file  Tobacco Use  . Smoking status:  Never Smoker  . Smokeless tobacco: Never Used  Substance and Sexual Activity  . Alcohol use: No    Alcohol/week: 0.0 standard drinks  . Drug use: No  . Sexual activity: Yes    Partners: Female    Birth control/protection: Condom  Lifestyle  . Physical activity:    Days per week: Not on file    Minutes per session: Not on file  . Stress: Not on file  Relationships  . Social connections:    Talks on phone: Not on file    Gets together: Not on file    Attends religious service: Not on file    Active member of club or organization: Not on file    Attends meetings of clubs or organizations: Not on file    Relationship status: Not on file  . Intimate partner violence:    Fear of current or ex partner: Not on file    Emotionally abused: Not on file    Physically abused: Not on file    Forced sexual activity: Not on file  Other Topics Concern  . Not on file  Social History Narrative   Lives with his wife and their 2 children.   He has 3 sons from a previous relationship that live independently.   Formerly in the [employer].   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    Family History  Problem Relation Age of Onset  . Diabetes Mother   . Stroke Mother        4 strokes  . Heart attack Mother   . Heart disease Mother   . Hypertension Father   . Hypertension Sister   . Heart disease Sister        pacemaker     Review of Systems  Constitutional: Negative.  Negative for chills and fever.  HENT: Positive for congestion. Negative for hearing loss.   Eyes: Negative.  Negative for blurred vision and double vision.  Respiratory: Negative.  Negative for cough and shortness of breath.   Cardiovascular: Negative.  Negative for chest pain and palpitations.  Gastrointestinal: Negative.  Negative for abdominal pain, diarrhea, nausea and vomiting.  Genitourinary: Negative.  Negative for dysuria and  hematuria.  Musculoskeletal: Negative for back pain, myalgias and neck pain.  Skin: Negative.  Negative for rash.  Neurological: Negative.  Negative for dizziness and headaches.  Endo/Heme/Allergies: Negative.     Vitals:   09/09/18 0825  BP: 127/78  Pulse: 72  Resp: 16  Temp: 97.9 F (36.6 C)  SpO2: 96%    Physical Exam  Constitutional: He is oriented to person, place, and time. He appears well-developed and well-nourished.  HENT:  Head: Normocephalic and atraumatic.  Nose: Mucosal edema present.  Eyes: Pupils are equal, round, and reactive to light. EOM are normal. Right conjunctiva is injected. Left conjunctiva  is injected.  Neck: Normal range of motion. Neck supple.  Cardiovascular: Normal rate, regular rhythm and normal heart sounds.  Pulmonary/Chest: Effort normal and breath sounds normal.  Abdominal: Soft. Bowel sounds are normal.  Musculoskeletal: Normal range of motion.  Lymphadenopathy:    He has no cervical adenopathy.  Neurological: He is alert and oriented to person, place, and time. No sensory deficit. He exhibits normal muscle tone.  Skin: Skin is warm and dry. Capillary refill takes less than 2 seconds. No rash noted.  Psychiatric: He has a normal mood and affect. His behavior is normal.  Vitals reviewed.  Results for orders placed or performed in visit on 09/09/18 (from the past 24 hour(s))  POCT glucose (manual entry)     Status: Abnormal   Collection Time: 09/09/18  9:08 AM  Result Value Ref Range   POC Glucose 109 (A) 70 - 99 mg/dl  POCT glycosylated hemoglobin (Hb A1C)     Status: Abnormal   Collection Time: 09/09/18  9:14 AM  Result Value Ref Range   Hemoglobin A1C 6.3 (A) 4.0 - 5.6 %   HbA1c POC (<> result, manual entry)     HbA1c, POC (prediabetic range)     HbA1c, POC (controlled diabetic range)     A total of 40 minutes was spent in the room with the patient, greater than 50% of which was in counseling/coordination of care regarding chronic  medical problems, management, medications, nutrition, need to follow-up.  Benign essential HTN Stable and well-controlled.  Continue present medication.  Follow-up in 6 months.  Type 2 diabetes mellitus without complication, without long-term current use of insulin (HCC) Stable diabetes.  Hemoglobin A1c down to 6.3.  Better than before.  Continue present treatment.  Follow-up in 6 months.   ASSESSMENT & PLAN: Nitesh was seen today for diabetes and hypertension.  Diagnoses and all orders for this visit:  Type 2 diabetes mellitus without complication, without long-term current use of insulin (HCC) -     POCT glucose (manual entry) -     POCT glycosylated hemoglobin (Hb A1C)  Dyslipidemia -     Lipid panel  Benign essential HTN  Environmental allergies Comments: Resume Flonase. If inadequate to control his symptoms, will add azelastine ns. Orders: -     fluticasone (FLONASE) 50 MCG/ACT nasal spray; Place 2 sprays into both nostrils daily.  Need for prophylactic vaccination and inoculation against influenza -     Flu Vaccine QUAD 36+ mos IM    Patient Instructions       If you have lab work done today you will be contacted with your lab results within the next 2 weeks.  If you have not heard from [place] then please contact us. The fastest way to get your results is to register for My Chart.   IF you received an x-ray today, you will receive an invoice from [facility]. Please contact [facility] at [phone] with questions or concerns regarding your invoice.   IF you received labwork today, you will receive an invoice from [facility]. Please contact [facility] at [phone] with questions or concerns regarding your invoice.   Our billing staff will not be able to assist you with questions regarding bills from these companies.  You will be contacted with the lab results as soon as they are available. The fastest way to get your results is to activate your My  Chart account. Instructions are located on the last page of this paperwork. If you have not heard  from [place] regarding the results in 2 weeks, please contact this office.     Diabetes Mellitus and Nutrition When you have diabetes (diabetes mellitus), it is very important to have healthy eating habits because your blood sugar (glucose) levels are greatly affected by what you eat and drink. Eating healthy foods in the appropriate amounts, at about the same times every day, can help you:  Control your blood glucose.  Lower your risk of heart disease.  Improve your blood pressure.  Reach or maintain a healthy weight.  Every person with diabetes is different, and each person has different needs for a meal plan. Your health care provider may recommend that you work with a diet and nutrition specialist (dietitian) to make a meal plan that is best for you. Your meal plan may vary depending on factors such as:  The calories you need.  The medicines you take.  Your weight.  Your blood glucose, blood pressure, and cholesterol levels.  Your activity level.  Other health conditions you have, such as heart or kidney disease.  How do carbohydrates affect me? Carbohydrates affect your blood glucose level more than any other type of food. Eating carbohydrates naturally increases the amount of glucose in your blood. Carbohydrate counting is a method for keeping track of how many carbohydrates you eat. Counting carbohydrates is important to keep your blood glucose at a healthy level, especially if you use insulin or take certain oral diabetes medicines. It is important to know how many carbohydrates you can safely have in each meal. This is different for every person. Your dietitian can help you calculate how many carbohydrates you should have at each meal and for snack. Foods that contain carbohydrates include:  Bread, cereal, rice, pasta, and crackers.  Potatoes and corn.  Peas, beans, and  lentils.  Milk and yogurt.  Fruit and juice.  Desserts, such as cakes, cookies, ice cream, and candy.  How does alcohol affect me? Alcohol can cause a sudden decrease in blood glucose (hypoglycemia), especially if you use insulin or take certain oral diabetes medicines. Hypoglycemia can be a life-threatening condition. Symptoms of hypoglycemia (sleepiness, dizziness, and confusion) are similar to symptoms of having too much alcohol. If your health care provider says that alcohol is safe for you, follow these guidelines:  Limit alcohol intake to no more than 1 drink per day for nonpregnant women and 2 drinks per day for men. One drink equals 12 oz of beer, 5 oz of wine, or 1 oz of hard liquor.  Do not drink on an empty stomach.  Keep yourself hydrated with water, diet soda, or unsweetened iced tea.  Keep in mind that regular soda, juice, and other mixers may contain a lot of sugar and must be counted as carbohydrates.  What are tips for following this plan? Reading food labels  Start by checking the serving size on the label. The amount of calories, carbohydrates, fats, and other nutrients listed on the label are based on one serving of the food. Many foods contain more than one serving per package.  Check the total grams (g) of carbohydrates in one serving. You can calculate the number of servings of carbohydrates in one serving by dividing the total carbohydrates by 15. For example, if a food has 30 g of total carbohydrates, it would be equal to 2 servings of carbohydrates.  Check the number of grams (g) of saturated and trans fats in one serving. Choose foods that have low or no amount of  these fats.  Check the number of milligrams (mg) of sodium in one serving. Most people should limit total sodium intake to less than 2,300 mg per day.  Always check the nutrition information of foods labeled as "low-fat" or "nonfat". These foods may be higher in added sugar or refined carbohydrates  and should be avoided.  Talk to your dietitian to identify your daily goals for nutrients listed on the label. Shopping  Avoid buying canned, premade, or processed foods. These foods tend to be high in fat, sodium, and added sugar.  Shop around the outside edge of the grocery store. This includes fresh fruits and vegetables, bulk grains, fresh meats, and fresh dairy. Cooking  Use low-heat cooking methods, such as baking, instead of high-heat cooking methods like deep frying.  Cook using healthy oils, such as olive, canola, or sunflower oil.  Avoid cooking with butter, cream, or high-fat meats. Meal planning  Eat meals and snacks regularly, preferably at the same times every day. Avoid going long periods of time without eating.  Eat foods high in fiber, such as fresh fruits, vegetables, beans, and whole grains. Talk to your dietitian about how many servings of carbohydrates you can eat at each meal.  Eat 4-6 ounces of lean protein each day, such as lean meat, chicken, fish, eggs, or tofu. 1 ounce is equal to 1 ounce of meat, chicken, or fish, 1 egg, or 1/4 cup of tofu.  Eat some foods each day that contain healthy fats, such as avocado, nuts, seeds, and fish. Lifestyle   Check your blood glucose regularly.  Exercise at least 30 minutes 5 or more days each week, or as told by your health care provider.  Take medicines as told by your health care provider.  Do not use any products that contain nicotine or tobacco, such as cigarettes and e-cigarettes. If you need help quitting, ask your health care provider.  Work with a Veterinary surgeon or diabetes educator to identify strategies to manage stress and any emotional and social challenges. What are some questions to ask my health care provider?  Do I need to meet with a diabetes educator?  Do I need to meet with a dietitian?  What number can I call if I have questions?  When are the best times to check my blood glucose? Where to find  more information:  American Diabetes Association: diabetes.org/food-and-fitness/food  Academy of Nutrition and Dietetics: https://www.vargas.com/  [employer] of Diabetes and Digestive and Kidney Diseases (NIH): FindJewelers.cz Summary  A healthy meal plan will help you control your blood glucose and maintain a healthy lifestyle.  Working with a diet and nutrition specialist (dietitian) can help you make a meal plan that is best for you.  Keep in mind that carbohydrates and alcohol have immediate effects on your blood glucose levels. It is important to count carbohydrates and to use alcohol carefully. This information is not intended to replace advice given to you by your health care provider. Make sure you discuss any questions you have with your health care provider. Document Released: 08/29/2005 Document Revised: 01/06/2017 Document Reviewed: 01/06/2017 Elsevier Interactive Patient Education  2018 [employer].  Hypertension Hypertension, commonly called high blood pressure, is when the force of blood pumping through the arteries is too strong. The arteries are the blood vessels that carry blood from the heart throughout the body. Hypertension forces the heart to work harder to pump blood and may cause arteries to become narrow or stiff. Having untreated or uncontrolled hypertension can  cause heart attacks, strokes, kidney disease, and other problems. A blood pressure reading consists of a higher number over a lower number. Ideally, your blood pressure should be below 120/80. The first ("top") number is called the systolic pressure. It is a measure of the pressure in your arteries as your heart beats. The second ("bottom") number is called the diastolic pressure. It is a measure of the pressure in your arteries as the heart relaxes. What are the causes? The cause of this  condition is not known. What increases the risk? Some risk factors for high blood pressure are under your control. Others are not. Factors you can change  Smoking.  Having type 2 diabetes mellitus, high cholesterol, or both.  Not getting enough exercise or physical activity.  Being overweight.  Having too much fat, sugar, calories, or salt (sodium) in your diet.  Drinking too much alcohol. Factors that are difficult or impossible to change  Having chronic kidney disease.  Having a family history of high blood pressure.  Age. Risk increases with age.  Race. You may be at higher risk if you are African-American.  Gender. Men are at higher risk than women before age 2. After age 90, women are at higher risk than men.  Having obstructive sleep apnea.  Stress. What are the signs or symptoms? Extremely high blood pressure (hypertensive crisis) may cause:  Headache.  Anxiety.  Shortness of breath.  Nosebleed.  Nausea and vomiting.  Severe chest pain.  Jerky movements you cannot control (seizures).  How is this diagnosed? This condition is diagnosed by measuring your blood pressure while you are seated, with your arm resting on a surface. The cuff of the blood pressure monitor will be placed directly against the skin of your upper arm at the level of your heart. It should be measured at least twice using the same arm. Certain conditions can cause a difference in blood pressure between your right and left arms. Certain factors can cause blood pressure readings to be lower or higher than normal (elevated) for a short period of time:  When your blood pressure is higher when you are in a health care provider's office than when you are at home, this is called white coat hypertension. Most people with this condition do not need medicines.  When your blood pressure is higher at home than when you are in a health care provider's office, this is called masked hypertension. Most  people with this condition may need medicines to control blood pressure.  If you have a high blood pressure reading during one visit or you have normal blood pressure with other risk factors:  You may be asked to return on a different day to have your blood pressure checked again.  You may be asked to monitor your blood pressure at home for 1 week or longer.  If you are diagnosed with hypertension, you may have other blood or imaging tests to help your health care provider understand your overall risk for other conditions. How is this treated? This condition is treated by making healthy lifestyle changes, such as eating healthy foods, exercising more, and reducing your alcohol intake. Your health care provider may prescribe medicine if lifestyle changes are not enough to get your blood pressure under control, and if:  Your systolic blood pressure is above 130.  Your diastolic blood pressure is above 80.  Your personal target blood pressure may vary depending on your medical conditions, your age, and other factors. Follow these instructions at  home: Eating and drinking  Eat a diet that is high in fiber and potassium, and low in sodium, added sugar, and fat. An example eating plan is called the DASH (Dietary Approaches to Stop Hypertension) diet. To eat this way: ? Eat plenty of fresh fruits and vegetables. Try to fill half of your plate at each meal with fruits and vegetables. ? Eat whole grains, such as whole wheat pasta, brown rice, or whole grain bread. Fill about one quarter of your plate with whole grains. ? Eat or drink low-fat dairy products, such as skim milk or low-fat yogurt. ? Avoid fatty cuts of meat, processed or cured meats, and poultry with skin. Fill about one quarter of your plate with lean proteins, such as fish, chicken without skin, beans, eggs, and tofu. ? Avoid premade and processed foods. These tend to be higher in sodium, added sugar, and fat.  Reduce your daily  sodium intake. Most people with hypertension should eat less than 1,500 mg of sodium a day.  Limit alcohol intake to no more than 1 drink a day for nonpregnant women and 2 drinks a day for men. One drink equals 12 oz of beer, 5 oz of wine, or 1 oz of hard liquor. Lifestyle  Work with your health care provider to maintain a healthy body weight or to lose weight. Ask what an ideal weight is for you.  Get at least 30 minutes of exercise that causes your heart to beat faster (aerobic exercise) most days of the week. Activities may include walking, swimming, or biking.  Include exercise to strengthen your muscles (resistance exercise), such as pilates or lifting weights, as part of your weekly exercise routine. Try to do these types of exercises for 30 minutes at least 3 days a week.  Do not use any products that contain nicotine or tobacco, such as cigarettes and e-cigarettes. If you need help quitting, ask your health care provider.  Monitor your blood pressure at home as told by your health care provider.  Keep all follow-up visits as told by your health care provider. This is important. Medicines  Take over-the-counter and prescription medicines only as told by your health care provider. Follow directions carefully. Blood pressure medicines must be taken as prescribed.  Do not skip doses of blood pressure medicine. Doing this puts you at risk for problems and can make the medicine less effective.  Ask your health care provider about side effects or reactions to medicines that you should watch for. Contact a health care provider if:  You think you are having a reaction to a medicine you are taking.  You have headaches that keep coming back (recurring).  You feel dizzy.  You have swelling in your ankles.  You have trouble with your vision. Get help right away if:  You develop a severe headache or confusion.  You have unusual weakness or numbness.  You feel faint.  You have  severe pain in your chest or abdomen.  You vomit repeatedly.  You have trouble breathing. Summary  Hypertension is when the force of blood pumping through your arteries is too strong. If this condition is not controlled, it may put you at risk for serious complications.  Your personal target blood pressure may vary depending on your medical conditions, your age, and other factors. For most people, a normal blood pressure is less than 120/80.  Hypertension is treated with lifestyle changes, medicines, or a combination of both. Lifestyle changes include weight loss, eating a  healthy, low-sodium diet, exercising more, and limiting alcohol. This information is not intended to replace advice given to you by your health care provider. Make sure you discuss any questions you have with your health care provider. Document Released: 12/02/2005 Document Revised: 10/30/2016 Document Reviewed: 10/30/2016 Elsevier Interactive Patient Education  2018 Elsevier Inc.      Edwina Barth, MD [facility]

## 2018-09-09 NOTE — Assessment & Plan Note (Signed)
Stable diabetes.  Hemoglobin A1c down to 6.3.  Better than before.  Continue present treatment.  Follow-up in 6 months.

## 2018-09-09 NOTE — Patient Instructions (Addendum)
   If you have lab work done today you will be contacted with your lab results within the next 2 weeks.  If you have not heard from us then please contact us. The fastest way to get your results is to register for My Chart.   IF you received an x-ray today, you will receive an invoice from [facility]. Please contact [facility] at [phone] with questions or concerns regarding your invoice.   IF you received labwork today, you will receive an invoice from [facility]. Please contact [facility] at [phone] with questions or concerns regarding your invoice.   Our billing staff will not be able to assist you with questions regarding bills from these companies.  You will be contacted with the lab results as soon as they are available. The fastest way to get your results is to activate your My Chart account. Instructions are located on the last page of this paperwork. If you have not heard from us regarding the results in 2 weeks, please contact this office.     Diabetes Mellitus and Nutrition When you have diabetes (diabetes mellitus), it is very important to have healthy eating habits because your blood sugar (glucose) levels are greatly affected by what you eat and drink. Eating healthy foods in the appropriate amounts, at about the same times every day, can help you:  Control your blood glucose.  Lower your risk of heart disease.  Improve your blood pressure.  Reach or maintain a healthy weight.  Every person with diabetes is different, and each person has different needs for a meal plan. Your health care provider may recommend that you work with a diet and nutrition specialist (dietitian) to make a meal plan that is best for you. Your meal plan may vary depending on factors such as:  The calories you need.  The medicines you take.  Your weight.  Your blood glucose, blood pressure, and cholesterol levels.  Your activity level.  Other health conditions you  have, such as heart or kidney disease.  How do carbohydrates affect me? Carbohydrates affect your blood glucose level more than any other type of food. Eating carbohydrates naturally increases the amount of glucose in your blood. Carbohydrate counting is a method for keeping track of how many carbohydrates you eat. Counting carbohydrates is important to keep your blood glucose at a healthy level, especially if you use insulin or take certain oral diabetes medicines. It is important to know how many carbohydrates you can safely have in each meal. This is different for every person. Your dietitian can help you calculate how many carbohydrates you should have at each meal and for snack. Foods that contain carbohydrates include:  Bread, cereal, rice, pasta, and crackers.  Potatoes and corn.  Peas, beans, and lentils.  Milk and yogurt.  Fruit and juice.  Desserts, such as cakes, cookies, ice cream, and candy.  How does alcohol affect me? Alcohol can cause a sudden decrease in blood glucose (hypoglycemia), especially if you use insulin or take certain oral diabetes medicines. Hypoglycemia can be a life-threatening condition. Symptoms of hypoglycemia (sleepiness, dizziness, and confusion) are similar to symptoms of having too much alcohol. If your health care provider says that alcohol is safe for you, follow these guidelines:  Limit alcohol intake to no more than 1 drink per day for nonpregnant women and 2 drinks per day for men. One drink equals 12 oz of beer, 5 oz of wine, or 1 oz of hard liquor.  Do   not drink on an empty stomach.  Keep yourself hydrated with water, diet soda, or unsweetened iced tea.  Keep in mind that regular soda, juice, and other mixers may contain a lot of sugar and must be counted as carbohydrates.  What are tips for following this plan? Reading food labels  Start by checking the serving size on the label. The amount of calories, carbohydrates, fats, and other  nutrients listed on the label are based on one serving of the food. Many foods contain more than one serving per package.  Check the total grams (g) of carbohydrates in one serving. You can calculate the number of servings of carbohydrates in one serving by dividing the total carbohydrates by 15. For example, if a food has 30 g of total carbohydrates, it would be equal to 2 servings of carbohydrates.  Check the number of grams (g) of saturated and trans fats in one serving. Choose foods that have low or no amount of these fats.  Check the number of milligrams (mg) of sodium in one serving. Most people should limit total sodium intake to less than 2,300 mg per day.  Always check the nutrition information of foods labeled as "low-fat" or "nonfat". These foods may be higher in added sugar or refined carbohydrates and should be avoided.  Talk to your dietitian to identify your daily goals for nutrients listed on the label. Shopping  Avoid buying canned, premade, or processed foods. These foods tend to be high in fat, sodium, and added sugar.  Shop around the outside edge of the grocery store. This includes fresh fruits and vegetables, bulk grains, fresh meats, and fresh dairy. Cooking  Use low-heat cooking methods, such as baking, instead of high-heat cooking methods like deep frying.  Cook using healthy oils, such as olive, canola, or sunflower oil.  Avoid cooking with butter, cream, or high-fat meats. Meal planning  Eat meals and snacks regularly, preferably at the same times every day. Avoid going long periods of time without eating.  Eat foods high in fiber, such as fresh fruits, vegetables, beans, and whole grains. Talk to your dietitian about how many servings of carbohydrates you can eat at each meal.  Eat 4-6 ounces of lean protein each day, such as lean meat, chicken, fish, eggs, or tofu. 1 ounce is equal to 1 ounce of meat, chicken, or fish, 1 egg, or 1/4 cup of tofu.  Eat some  foods each day that contain healthy fats, such as avocado, nuts, seeds, and fish. Lifestyle   Check your blood glucose regularly.  Exercise at least 30 minutes 5 or more days each week, or as told by your health care provider.  Take medicines as told by your health care provider.  Do not use any products that contain nicotine or tobacco, such as cigarettes and e-cigarettes. If you need help quitting, ask your health care provider.  Work with a counselor or diabetes educator to identify strategies to manage stress and any emotional and social challenges. What are some questions to ask my health care provider?  Do I need to meet with a diabetes educator?  Do I need to meet with a dietitian?  What number can I call if I have questions?  When are the best times to check my blood glucose? Where to find more information:  American Diabetes Association: diabetes.org/food-and-fitness/food  Academy of Nutrition and Dietetics: www.eatright.org/resources/health/diseases-and-conditions/diabetes  National Institute of Diabetes and Digestive and Kidney Diseases (NIH): www.niddk.nih.gov/health-information/diabetes/overview/diet-eating-physical-activity Summary  A healthy meal plan will   help you control your blood glucose and maintain a healthy lifestyle.  Working with a diet and nutrition specialist (dietitian) can help you make a meal plan that is best for you.  Keep in mind that carbohydrates and alcohol have immediate effects on your blood glucose levels. It is important to count carbohydrates and to use alcohol carefully. This information is not intended to replace advice given to you by your health care provider. Make sure you discuss any questions you have with your health care provider. Document Released: 08/29/2005 Document Revised: 01/06/2017 Document Reviewed: 01/06/2017 Elsevier Interactive Patient Education  2018 Elsevier Inc.  Hypertension Hypertension, commonly called high  blood pressure, is when the force of blood pumping through the arteries is too strong. The arteries are the blood vessels that carry blood from the heart throughout the body. Hypertension forces the heart to work harder to pump blood and may cause arteries to become narrow or stiff. Having untreated or uncontrolled hypertension can cause heart attacks, strokes, kidney disease, and other problems. A blood pressure reading consists of a higher number over a lower number. Ideally, your blood pressure should be below 120/80. The first ("top") number is called the systolic pressure. It is a measure of the pressure in your arteries as your heart beats. The second ("bottom") number is called the diastolic pressure. It is a measure of the pressure in your arteries as the heart relaxes. What are the causes? The cause of this condition is not known. What increases the risk? Some risk factors for high blood pressure are under your control. Others are not. Factors you can change  Smoking.  Having type 2 diabetes mellitus, high cholesterol, or both.  Not getting enough exercise or physical activity.  Being overweight.  Having too much fat, sugar, calories, or salt (sodium) in your diet.  Drinking too much alcohol. Factors that are difficult or impossible to change  Having chronic kidney disease.  Having a family history of high blood pressure.  Age. Risk increases with age.  Race. You may be at higher risk if you are African-American.  Gender. Men are at higher risk than women before age 45. After age 65, women are at higher risk than men.  Having obstructive sleep apnea.  Stress. What are the signs or symptoms? Extremely high blood pressure (hypertensive crisis) may cause:  Headache.  Anxiety.  Shortness of breath.  Nosebleed.  Nausea and vomiting.  Severe chest pain.  Jerky movements you cannot control (seizures).  How is this diagnosed? This condition is diagnosed by  measuring your blood pressure while you are seated, with your arm resting on a surface. The cuff of the blood pressure monitor will be placed directly against the skin of your upper arm at the level of your heart. It should be measured at least twice using the same arm. Certain conditions can cause a difference in blood pressure between your right and left arms. Certain factors can cause blood pressure readings to be lower or higher than normal (elevated) for a short period of time:  When your blood pressure is higher when you are in a health care provider's office than when you are at home, this is called white coat hypertension. Most people with this condition do not need medicines.  When your blood pressure is higher at home than when you are in a health care provider's office, this is called masked hypertension. Most people with this condition may need medicines to control blood pressure.  If you have   a high blood pressure reading during one visit or you have normal blood pressure with other risk factors:  You may be asked to return on a different day to have your blood pressure checked again.  You may be asked to monitor your blood pressure at home for 1 week or longer.  If you are diagnosed with hypertension, you may have other blood or imaging tests to help your health care provider understand your overall risk for other conditions. How is this treated? This condition is treated by making healthy lifestyle changes, such as eating healthy foods, exercising more, and reducing your alcohol intake. Your health care provider may prescribe medicine if lifestyle changes are not enough to get your blood pressure under control, and if:  Your systolic blood pressure is above 130.  Your diastolic blood pressure is above 80.  Your personal target blood pressure may vary depending on your medical conditions, your age, and other factors. Follow these instructions at home: Eating and drinking  Eat a  diet that is high in fiber and potassium, and low in sodium, added sugar, and fat. An example eating plan is called the DASH (Dietary Approaches to Stop Hypertension) diet. To eat this way: ? Eat plenty of fresh fruits and vegetables. Try to fill half of your plate at each meal with fruits and vegetables. ? Eat whole grains, such as whole wheat pasta, brown rice, or whole grain bread. Fill about one quarter of your plate with whole grains. ? Eat or drink low-fat dairy products, such as skim milk or low-fat yogurt. ? Avoid fatty cuts of meat, processed or cured meats, and poultry with skin. Fill about one quarter of your plate with lean proteins, such as fish, chicken without skin, beans, eggs, and tofu. ? Avoid premade and processed foods. These tend to be higher in sodium, added sugar, and fat.  Reduce your daily sodium intake. Most people with hypertension should eat less than 1,500 mg of sodium a day.  Limit alcohol intake to no more than 1 drink a day for nonpregnant women and 2 drinks a day for men. One drink equals 12 oz of beer, 5 oz of wine, or 1 oz of hard liquor. Lifestyle  Work with your health care provider to maintain a healthy body weight or to lose weight. Ask what an ideal weight is for you.  Get at least 30 minutes of exercise that causes your heart to beat faster (aerobic exercise) most days of the week. Activities may include walking, swimming, or biking.  Include exercise to strengthen your muscles (resistance exercise), such as pilates or lifting weights, as part of your weekly exercise routine. Try to do these types of exercises for 30 minutes at least 3 days a week.  Do not use any products that contain nicotine or tobacco, such as cigarettes and e-cigarettes. If you need help quitting, ask your health care provider.  Monitor your blood pressure at home as told by your health care provider.  Keep all follow-up visits as told by your health care provider. This is  important. Medicines  Take over-the-counter and prescription medicines only as told by your health care provider. Follow directions carefully. Blood pressure medicines must be taken as prescribed.  Do not skip doses of blood pressure medicine. Doing this puts you at risk for problems and can make the medicine less effective.  Ask your health care provider about side effects or reactions to medicines that you should watch for. Contact a health care   provider if:  You think you are having a reaction to a medicine you are taking.  You have headaches that keep coming back (recurring).  You feel dizzy.  You have swelling in your ankles.  You have trouble with your vision. Get help right away if:  You develop a severe headache or confusion.  You have unusual weakness or numbness.  You feel faint.  You have severe pain in your chest or abdomen.  You vomit repeatedly.  You have trouble breathing. Summary  Hypertension is when the force of blood pumping through your arteries is too strong. If this condition is not controlled, it may put you at risk for serious complications.  Your personal target blood pressure may vary depending on your medical conditions, your age, and other factors. For most people, a normal blood pressure is less than 120/80.  Hypertension is treated with lifestyle changes, medicines, or a combination of both. Lifestyle changes include weight loss, eating a healthy, low-sodium diet, exercising more, and limiting alcohol. This information is not intended to replace advice given to you by your health care provider. Make sure you discuss any questions you have with your health care provider. Document Released: 12/02/2005 Document Revised: 10/30/2016 Document Reviewed: 10/30/2016 Elsevier Interactive Patient Education  2018 Elsevier Inc.  

## 2018-09-10 LAB — LIPID PANEL
CHOLESTEROL TOTAL: 128 mg/dL (ref 100–199)
Chol/HDL Ratio: 4.3 ratio (ref 0.0–5.0)
HDL: 30 mg/dL — ABNORMAL LOW (ref >39)
LDL Calculated: 78 mg/dL (ref 0–99)
TRIGLYCERIDES: 102 mg/dL (ref 0–149)
VLDL Cholesterol Cal: 20 mg/dL (ref 5–40)

## 2018-09-11 ENCOUNTER — Encounter: Payer: Self-pay | Admitting: Emergency Medicine

## 2018-09-11 ENCOUNTER — Encounter: Payer: Self-pay | Admitting: *Deleted

## 2019-02-08 DIAGNOSIS — M19071 Primary osteoarthritis, right ankle and foot: Secondary | ICD-10-CM | POA: Diagnosis not present

## 2019-02-10 ENCOUNTER — Other Ambulatory Visit: Payer: Self-pay

## 2019-02-10 ENCOUNTER — Encounter (HOSPITAL_COMMUNITY): Payer: Self-pay | Admitting: Emergency Medicine

## 2019-02-10 ENCOUNTER — Emergency Department (HOSPITAL_COMMUNITY)
Admission: EM | Admit: 2019-02-10 | Discharge: 2019-02-10 | Disposition: A | Discharge status: Home / Self Care | Payer: BLUE CROSS/BLUE SHIELD | Attending: Emergency Medicine | Admitting: Emergency Medicine

## 2019-02-10 ENCOUNTER — Emergency Department (HOSPITAL_COMMUNITY): Payer: BLUE CROSS/BLUE SHIELD

## 2019-02-10 DIAGNOSIS — Z79899 Other long term (current) drug therapy: Secondary | ICD-10-CM | POA: Insufficient documentation

## 2019-02-10 DIAGNOSIS — M79671 Pain in right foot: Secondary | ICD-10-CM

## 2019-02-10 DIAGNOSIS — I10 Essential (primary) hypertension: Secondary | ICD-10-CM | POA: Insufficient documentation

## 2019-02-10 DIAGNOSIS — M21611 Bunion of right foot: Secondary | ICD-10-CM | POA: Diagnosis not present

## 2019-02-10 DIAGNOSIS — E119 Type 2 diabetes mellitus without complications: Secondary | ICD-10-CM | POA: Insufficient documentation

## 2019-02-10 DIAGNOSIS — Z7984 Long term (current) use of oral hypoglycemic drugs: Secondary | ICD-10-CM | POA: Insufficient documentation

## 2019-02-10 MED ORDER — HYDROCODONE-ACETAMINOPHEN 5-325 MG PO TABS: 1.0000 | ORAL_TABLET | Freq: Four times a day (QID) | ORAL | 0 refills | Status: DC | PRN

## 2019-02-10 MED ORDER — KETOROLAC TROMETHAMINE 30 MG/ML IJ SOLN
60.0000 mg | Freq: Once | INTRAMUSCULAR | Status: AC
  Administered 2019-02-10: 60 mg via INTRAMUSCULAR
  Filled 2019-02-10: qty 2

## 2019-02-10 NOTE — ED Provider Notes (Signed)
TIME SEEN: 6:20 AM  CHIEF COMPLAINT: Right foot pain  HPI: Patient is a 48 year old male with history of hypertension, diabetes, hyperlipidemia who presents to the emergency department with right foot pain for the past several days.  Has had a previous left-sided bunionectomy with podiatry.  States he was scheduled to have bunionectomy on the right side but canceled this as he wanted to finish school first.  States over the past several days this area has been bothering him and is throbbing in pain.  Was seen at urgent care and was given tramadol which is no longer relieving his pain.  States he could not sleep tonight because of foot is throbbing.  States he is on his feet frequently when he is working.  No injury that he can recall.  No ankle pain.  No swelling, redness or warmth.  No fever.  No numbness.  Able to ambulate.  ROS: See HPI Constitutional: no fever  Eyes: no drainage  ENT: no runny nose   Cardiovascular:  no chest pain  Resp: no SOB  GI: no vomiting GU: no dysuria Integumentary: no rash  Allergy: no hives  Musculoskeletal: no leg swelling  Neurological: no slurred speech ROS otherwise negative  PAST MEDICAL HISTORY/PAST SURGICAL HISTORY:  Past Medical History:  Diagnosis Date  . Allergy   . Diabetes mellitus without complication (HCC)   . Elevated cholesterol   . Hernia    bi lateral hernia repair  . History of kidney stones   . Hypertension   . Sleep apnea 2014   severe  . Umbilical hernia without obstruction and without gangrene 09/03/2017    MEDICATIONS:  Prior to Admission medications   Medication Sig Start Date End Date Taking? Authorizing Provider  atorvastatin (LIPITOR) 20 MG tablet Take 1 tablet (20 mg total) by mouth daily. 03/10/18   Porfirio Oar, PA  fluticasone (FLONASE) 50 MCG/ACT nasal spray Place 2 sprays into both nostrils daily. 09/09/18   Georgina Quint, MD  glucose blood test strip Use as instructed 03/03/18   Porfirio Oar, PA   Lancets MISC Use for home glucose monitoring, daily 03/03/18   Porfirio Oar, PA  lisinopril (PRINIVIL,ZESTRIL) 20 MG tablet TAKE 1 TABLET BY MOUTH EVERY DAY 04/07/18   Porfirio Oar, PA  meloxicam (MOBIC) 15 MG tablet Take 15 mg by mouth daily as needed. 01/15/18   [provider]  metFORMIN (GLUCOPHAGE-XR) 500 MG 24 hr tablet Take 2 tablets (1,000 mg total) by mouth daily with breakfast. 12/19/17   Porfirio Oar, PA  sildenafil (VIAGRA) 100 MG tablet Take 0.5-1 tablets (50-100 mg total) by mouth daily as needed for erectile dysfunction. Patient not taking: Reported on 09/09/2018 06/10/18   Georgina Quint, MD    ALLERGIES:  Allergies  Allergen Reactions  . Doxycycline Hives  . Erythromycin Base Hives    SOCIAL HISTORY:  Social History   Tobacco Use  . Smoking status: Never Smoker  . Smokeless tobacco: Never Used  Substance Use Topics  . Alcohol use: No    Alcohol/week: 0.0 standard drinks    FAMILY HISTORY: Family History  Problem Relation Age of Onset  . Diabetes Mother   . Stroke Mother        4 strokes  . Heart attack Mother   . Heart disease Mother   . Hypertension Father   . Hypertension Sister   . Heart disease Sister        pacemaker    EXAM: BP (!) 139/97 (BP Location:  Left Arm)   Pulse 76   Temp 97.6 F (36.4 C) (Oral)   Resp 18   Ht 5\' 7"  (1.702 m)   Wt 117.9 kg   SpO2 98%   BMI 40.72 kg/m  CONSTITUTIONAL: Alert and oriented and responds appropriately to questions. Well-appearing; well-nourished HEAD: Normocephalic EYES: Conjunctivae clear, pupils appear equal, EOMI ENT: normal nose; moist mucous membranes NECK: Supple, no meningismus, no nuchal rigidity, no LAD  CARD: RRR; S1 and S2 appreciated; no murmurs, no clicks, no rubs, no gallops RESP: Normal chest excursion without splinting or tachypnea; breath sounds clear and equal bilaterally; no wheezes, no rhonchi, no rales, no hypoxia or respiratory distress, speaking full  sentences ABD/GI: Normal bowel sounds; non-distended; soft, non-tender, no rebound, no guarding, no peritoneal signs, no hepatosplenomegaly BACK:  The back appears normal  EXT: Patient is tender to palpation over the dorsal medial right foot.  He has a bunion.  He is status post bunionectomy of the left foot.  He has 2+ DP pulses bilaterally.  No redness, warmth, swelling, ecchymosis, gangrene, breaks in the skin noted to the right foot.  Sensation to the right foot is normal.  Ankle is nontender to palpation.  No calf tenderness or swelling.  No joint effusion noted.  Reports normal sensation in the right foot.  Compartments soft in the right leg. SKIN: Normal color for age and race; warm; no rash NEURO: Moves all extremities equally PSYCH: The patient's mood and manner are appropriate. Grooming and personal hygiene are appropriate.  MEDICAL DECISION MAKING: Patient here with pain likely from his bunion.  States tramadol has not relieving his pain.  Will obtain x-ray to evaluate for any pathologic fracture, stress fracture.  No injury that he can recall.  Neurovascularly intact distally.  Nothing to suggest gout, septic arthritis, compartment syndrome, DVT, arterial obstruction.  He drove himself to the emergency department and would like to drive home.  Will give IM Toradol for pain control.  ED PROGRESS: Patient reports no significant improvement after Toradol but he was sleeping in the room when I entered.  Will discharge with short prescription of hydrocodone for pain control.  Have advised him he cannot take this with tramadol.  Re[facility] where he has [facility] had a left bunionectomy.  Recommended rice.  Discussed return precautions.  At this time, I do not feel there is any life-threatening condition present. I have reviewed and discussed all results (EKG, imaging, lab, urine as appropriate) and exam findings with patient/family. I have reviewed nursing  notes and appropriate previous records.  I feel the patient is safe to be discharged home without further emergent workup and can continue workup as an outpatient as needed. Discussed usual and customary return precautions. Patient/family verbalize understanding and are comfortable with this plan.  Outpatient follow-up has been provided as needed. All questions have been answered.      , Layla Maw, DO 02/10/19 [phone]

## 2019-02-10 NOTE — ED Notes (Signed)
Patient transported to X-ray 

## 2019-02-10 NOTE — ED Triage Notes (Addendum)
Pt reports going to the [facility] 3 days ago for right foot pain and being given tramadol for pain. Pt reports taking some tramadol last evening with no relief. Pt reports the pain is related to a bunyan was supposed to have same removed.

## 2019-02-11 DIAGNOSIS — I10 Essential (primary) hypertension: Secondary | ICD-10-CM | POA: Diagnosis not present

## 2019-02-11 DIAGNOSIS — M21611 Bunion of right foot: Secondary | ICD-10-CM | POA: Diagnosis not present

## 2019-02-11 DIAGNOSIS — E119 Type 2 diabetes mellitus without complications: Secondary | ICD-10-CM | POA: Diagnosis not present

## 2019-02-15 ENCOUNTER — Ambulatory Visit (INDEPENDENT_AMBULATORY_CARE_PROVIDER_SITE_OTHER): Payer: BLUE CROSS/BLUE SHIELD | Admitting: Podiatry

## 2019-02-15 ENCOUNTER — Encounter: Payer: Self-pay | Admitting: Podiatry

## 2019-02-15 ENCOUNTER — Ambulatory Visit (INDEPENDENT_AMBULATORY_CARE_PROVIDER_SITE_OTHER): Payer: BLUE CROSS/BLUE SHIELD

## 2019-02-15 ENCOUNTER — Other Ambulatory Visit: Payer: Self-pay | Admitting: Podiatry

## 2019-02-15 DIAGNOSIS — M79672 Pain in left foot: Principal | ICD-10-CM

## 2019-02-15 DIAGNOSIS — M79671 Pain in right foot: Secondary | ICD-10-CM

## 2019-02-15 DIAGNOSIS — M2011 Hallux valgus (acquired), right foot: Secondary | ICD-10-CM

## 2019-02-15 DIAGNOSIS — M2012 Hallux valgus (acquired), left foot: Principal | ICD-10-CM

## 2019-02-15 DIAGNOSIS — L600 Ingrowing nail: Secondary | ICD-10-CM

## 2019-02-15 DIAGNOSIS — M779 Enthesopathy, unspecified: Secondary | ICD-10-CM | POA: Diagnosis not present

## 2019-02-15 DIAGNOSIS — Q742 Other congenital malformations of lower limb(s), including pelvic girdle: Secondary | ICD-10-CM

## 2019-02-15 MED ORDER — NEOMYCIN-POLYMYXIN-HC 3.5-10000-1 OT SOLN: OTIC | 0 refills | Status: DC

## 2019-02-15 MED ORDER — TRIAMCINOLONE ACETONIDE 10 MG/ML IJ SUSP
10.0000 mg | Freq: Once | INTRAMUSCULAR | Status: AC
  Administered 2019-02-15: 10 mg

## 2019-02-15 NOTE — Patient Instructions (Addendum)
Bunion  A bunion is a bump on the base of the big toe that forms when the bones of the big toe joint move out of position. Bunions may be small at first, but they often get larger over time. They can make walking painful. What are the causes? A bunion may be caused by:  Wearing narrow or pointed shoes that force the big toe to press against the other toes.  Abnormal foot development that causes the foot to roll inward (pronate).  Changes in the foot that are caused by certain diseases, such as rheumatoid arthritis or polio.  A foot injury. What increases the risk? The following factors may make you more likely to develop this condition:  Wearing shoes that squeeze the toes together.  Having certain diseases, such as: ? Rheumatoid arthritis. ? Polio. ? Cerebral palsy.  Having family members who have bunions.  Being born with a foot deformity, such as flat feet or low arches.  Doing activities that put a lot of pressure on the feet, such as ballet dancing. What are the signs or symptoms? The main symptom of a bunion is a noticeable bump on the big toe. Other symptoms may include:  Pain.  Swelling around the big toe.  Redness and inflammation.  Thick or hardened skin on the big toe or between the toes.  Stiffness or loss of motion in the big toe.  Trouble with walking. How is this diagnosed? A bunion may be diagnosed based on your symptoms, medical history, and activities. You may have tests, such as:  X-rays. These allow your health care provider to check the position of the bones in your foot and look for damage to your joint. They also help your health care provider determine the severity of your bunion and the best way to treat it.  Joint aspiration. In this test, a sample of fluid is removed from the toe joint. This test may be done if you are in a lot of pain. It helps rule out diseases that cause painful swelling of the joints, such as arthritis. How is this  treated? Treatment depends on the severity of your symptoms. The goal of treatment is to relieve symptoms and prevent the bunion from getting worse. Your health care provider may recommend:  Wearing shoes that have a wide toe box.  Using bunion pads to cushion the affected area.  Taping your toes together to keep them in a normal position.  Placing a device inside your shoe (orthotics) to help reduce pressure on your toe joint.  Taking medicine to ease pain, inflammation, and swelling.  Applying heat or ice to the affected area.  Doing stretching exercises.  Surgery to remove scar tissue and move the toes back into their normal position. This treatment is rare. Follow these instructions at home: Managing pain, stiffness, and swelling   If directed, put ice on the painful area: ? Put ice in a plastic bag. ? Place a towel between your skin and the bag. ? Leave the ice on for 20 minutes, 2-3 times a day. Activity   If directed, apply heat to the affected area before you exercise. Use the heat source that your health care provider recommends, such as a moist heat pack or a heating pad. ? Place a towel between your skin and the heat source. ? Leave the heat on for 20-30 minutes. ? Remove the heat if your skin turns bright red. This is especially important if you are unable to feel pain,   heat, or cold. You may have a greater risk of getting burned.  Do exercises as told by your health care provider. General instructions  Support your toe joint with proper footwear, shoe padding, or taping as told by your health care provider.  Take over-the-counter and prescription medicines only as told by your health care provider.  Keep all follow-up visits as told by your health care provider. This is important. Contact a health care provider if your symptoms:  Get worse.  Do not improve in 2 weeks. Get help right away if you have:  Severe pain and trouble with walking. Summary  A  bunion is a bump on the base of the big toe that forms when the bones of the big toe joint move out of position.  Bunions can make walking painful.  Treatment depends on the severity of your symptoms.  Support your toe joint with proper footwear, shoe padding, or taping as told by your health care provider. This information is not intended to replace advice given to you by your health care provider. Make sure you discuss any questions you have with your health care provider. Document Released: 12/02/2005 Document Revised: 04/14/2018 Document Reviewed: 04/14/2018 Elsevier Interactive Patient Education  2019 [employer].  [employer] Instructions    THE DAY AFTER THE PROCEDURE  Place 1/4 cup of epsom salts in a quart of warm tap water.  Submerge your foot or feet with outer bandage intact for the initial soak; this will allow the bandage to become moist and wet for easy lift off.  Once you remove your bandage, continue to soak in the solution for 20 minutes.  This soak should be done twice a day.  Next, remove your foot or feet from solution, blot dry the affected area and cover.  You may use a band aid large enough to cover the area or use gauze and tape.  Apply other medications to the area as directed by the doctor such as polysporin neosporin.  IF YOUR SKIN BECOMES IRRITATED WHILE USING THESE INSTRUCTIONS, IT IS OKAY TO SWITCH TO  WHITE VINEGAR AND WATER. Or you may use antibacterial soap and water to keep the toe clean  Monitor for any signs/symptoms of infection. Call the office immediately if any occur or go directly to the emergency room. Call with any questions/concerns.    Long Term Care Instructions-Post Nail Surgery  You have had your ingrown toenail and root treated with a chemical.  This chemical causes a burn that will drain and ooze like a blister.  This can drain for 6-8 weeks or longer.  It is important to keep this area clean, covered, and follow the soaking instructions  dispensed at the time of your surgery.  This area will eventually dry and form a scab.  Once the scab forms you no longer need to soak or apply a dressing.  If at any time you experience an increase in pain, redness, swelling, or drainage, you should contact the office as soon as possible.

## 2019-02-18 ENCOUNTER — Telehealth: Payer: Self-pay | Admitting: Podiatry

## 2019-02-18 NOTE — Telephone Encounter (Signed)
Pt had injection on 02/15/19 for a bunion on right foot, patient states that the area seems to be hurting him more now than before the injection. Patient would like to know what he can do for pain or know if he should come back in to be seen.

## 2019-02-18 NOTE — Telephone Encounter (Signed)
Pt states he has sharp pain in the bunion area and the ingrown toenails are doing fine, and just found a blister on my toe. I told pt on occasion after a steroid injection some pts may experience a steroid flare of symptoms that generally last 3-5 days, mobic as directed, ice 3-4 times daily for 15-20 minutes/session protecting the skin with a light cloth, wear a stiff bottom shoe to decrease bend at the joint and continue irritation to the area. I told pt that we needed to see him for the blister it was a new problem, to keep clean and if opened cover with an antibiotic ointment dressing. Pt is scheduled tomorrow with Dr. Charlsie Merles at 10:45am.

## 2019-02-18 NOTE — Progress Notes (Addendum)
Subjective:   Patient ID: Walter Reynolds., male   DOB: 48 y.o.   MRN: [id number]   HPI Patient presents stating that he had his left bunion fixed and the right one has been moderately bothersome and he is getting chronic ingrown toenails of both his big toes that he is tried to trim himself and soak without relief patient has quite a bit of pain and at this point is not able to wear shoe gear with any degree of comfort.  Patient states he is not able to work currently due to the shoe gear restrictions and the type of work he has to do and I agree until we do surgery this is the best way for him to go would be to be off of work currently   ROS      Objective:  Physical Exam  Neurovascular status intact with mild inflammation around the first MPJ right localized with patient found to have incurvated nail borders of the hallux bilateral that are painful when pressed with chronic nature to this condition.  Patient has good digital perfusion well oriented x3 and does have scar the left first MPJ from previous surgery     Assessment:  Inflammatory capsulitis first MPJ right with ingrown toenail deformity hallux bilateral with moderate tenderness     Plan:  H&P conditions reviewed and discussed correction of the ingrown toenails.  Patient states his had a long time he wants it fixed and I explained procedure risk and the procedure itself.  Patient signed consent form after review and at this time I infiltrated each hallux 60 mg like Marcaine mixture sterile prep applied to the digits and using sterile instrumentation I remove the corner exposed matrix and applied phenol 3 applications 30 seconds followed by alcohol lavage and sterile dressing.  I then did careful injection of the first MPJ right 3 mg Kenalog 5 mg Xylocaine and advised that some day surgery to be done but I rather try to treat it conservatively.  Reappoint for [place] to recheck.  At this point due to the intensity of his discomfort I  recommended that we go ahead and keep him off work at the current time understanding that were going to get him scheduled for outpatient surgery to fix the structural deformity that is creating this chronic pain he is in.  I did go ahead and allow him to do that with wider shoes to be worn at home and no increased activity until we can do the surgical procedure on him  X-ray indicates mild deformity first MPJ right with some compression of the joint with left showing fixation of the first metatarsal and fixation of the left big toe from previous surgery

## 2019-02-19 ENCOUNTER — Encounter: Payer: Self-pay | Admitting: Podiatry

## 2019-02-19 ENCOUNTER — Ambulatory Visit: Payer: BLUE CROSS/BLUE SHIELD | Admitting: Podiatry

## 2019-02-19 DIAGNOSIS — M779 Enthesopathy, unspecified: Secondary | ICD-10-CM

## 2019-02-19 MED ORDER — DICLOFENAC SODIUM 75 MG PO TBEC: 75.0000 mg | DELAYED_RELEASE_TABLET | Freq: Two times a day (BID) | ORAL | 2 refills | Status: DC

## 2019-02-19 MED ORDER — TRIAMCINOLONE ACETONIDE 10 MG/ML IJ SUSP
10.0000 mg | Freq: Once | INTRAMUSCULAR | Status: AC
  Administered 2019-02-19: 10 mg

## 2019-02-21 NOTE — Progress Notes (Signed)
Subjective:   Patient ID: Walter Reynolds., male   DOB: 48 y.o.   MRN: [id number]   HPI Patient states seems to be doing pretty well with his nail procedures with some slight blistering but local but is having a lot of pain in the plantar first MPJ and states the injection we gave him only helped a portion of his problems   ROS      Objective:  Physical Exam  Neurovascular status intact with improvement around the dorsal first MPJ right but quite a bit of plantar pain in the sesamoidal complex that is localized with ingrown toenails that have healed well     Assessment:  Dorsolateral capsulitis first MPJ which seems to be improved with sesamoiditis with inflammation plantar aspect right first MPJ     Plan:  H&P discussed both conditions and at this time I did a careful plantar sesamoidal injection 3 mg Dexasone Kenalog 5 mg Xylocaine and applied dancers pad to take pressure off the joint surface.  Continue soaks and will be seen back and may require other treatments if symptoms persist

## 2019-02-23 ENCOUNTER — Encounter: Payer: Self-pay | Admitting: Podiatry

## 2019-02-23 NOTE — Telephone Encounter (Signed)
I called pt, asked pt to describe symptoms of the right foot. Pt states he is having severe pain in the right foot at the big toe joint, has some relief with the antiinflammatory from Dr. Charlsie Merles and the pain medication from a different doctor. Pt states he has a surgical shoe that Dr. Charlsie Merles has padded off, but can't put the foot down to walk to the bathroom without the shoe. I spoke with Dr. Ardelle Anton due to DR. Regal not in office, Dr. Ardelle Anton states have pt continue in the shoe, come in office to pick up another off loading pad as the pad flattens and decreases the offloading capability, continue the diclofenac and make an appt with Dr. Charlsie Merles for Friday to re-x-ray for possible changes. I informed pt of Dr. Gabriel Rung orders and to also ice 3-4 times daily 15-20 minutes/session. Pt states understanding. Pt states he would like to pick up 02/26/2019, the letter he forgot to get 02/19/2019, stating he is to be out of work until reevaluated 03/03/2019. Letter placed in front reception pick-up box. Pt states he will need paperwork filled out for our office and needed our fax number. I informed pt the paperwork should be addressed to Algis Greenhouse and faxed to [phone].

## 2019-02-26 ENCOUNTER — Other Ambulatory Visit: Payer: Self-pay

## 2019-02-26 ENCOUNTER — Encounter: Payer: Self-pay | Admitting: Podiatry

## 2019-02-26 ENCOUNTER — Ambulatory Visit: Payer: BLUE CROSS/BLUE SHIELD | Admitting: Podiatry

## 2019-02-26 DIAGNOSIS — M779 Enthesopathy, unspecified: Secondary | ICD-10-CM | POA: Diagnosis not present

## 2019-02-26 DIAGNOSIS — M21611 Bunion of right foot: Secondary | ICD-10-CM

## 2019-02-26 DIAGNOSIS — M79676 Pain in unspecified toe(s): Secondary | ICD-10-CM

## 2019-02-26 DIAGNOSIS — M2011 Hallux valgus (acquired), right foot: Secondary | ICD-10-CM | POA: Diagnosis not present

## 2019-02-26 DIAGNOSIS — M2012 Hallux valgus (acquired), left foot: Secondary | ICD-10-CM

## 2019-02-26 DIAGNOSIS — M21612 Bunion of left foot: Principal | ICD-10-CM

## 2019-02-26 NOTE — Progress Notes (Signed)
Subjective:   Patient ID: Walter Reynolds., male   DOB: 48 y.o.   MRN: [id number]   HPI Patient states that the plantar surface is feeling much better but that he continues to have pain in his bunion site and he would just like to go ahead and get it corrected like the other foot.  States it is been a long time is bothering him and he is tried wider shoes he is tried wearing a surgical shoe and soaks without relief and he would just rather go ahead and get it corrected   ROS      Objective:  Physical Exam  Neurovascular status intact with structural bunion deformity noted right with moderate hallux limitus and reduced range of motion of the first MPJ right foot     Assessment:  Chronic HAV deformity right with hallux limitus component right with history of foot surgery on the left which did well over time     Plan:  H&P condition reviewed and discussed the differences between hallux limitus and HAV and the fact he has components of both in the difference between the other foot.  I recommended a distal osteotomy right with possibility of a biplanar component and I went ahead and discussed this with him and he wants to get it done soon as possible due to his schedule.  At this point I did allow him to read a consent form for correction and spent a great deal time with him going over the procedure and recovery.  He understands no guarantee as far success understands total recovery.  Takes about 6 months and is willing to accept risk of surgery signed consent form and is scheduled for outpatient procedure.  I did dispense air fracture walker as his this fall apart gave instructions on how to use it and he will get used to this prior to the procedure.  Reappoint next week for surgery and encouraged to call with questions

## 2019-02-26 NOTE — Patient Instructions (Signed)
Pre-Operative Instructions  Congratulations, you have decided to take an important step towards improving your quality of life.  You can be assured that the doctors and staff at [facility] will be with you every step of the way.  Here are some important things you should know:  1. Plan to be at the surgery center/hospital at least 1 (one) hour prior to your scheduled time, unless otherwise directed by the surgical center/hospital staff.  You must have a responsible adult accompany you, remain during the surgery and drive you home.  Make sure you have directions to the surgical center/hospital to ensure you arrive on time. 2. If you are having surgery at [facility] or  hospitals, you will need a copy of your medical history and physical form from your family physician within one month prior to the date of surgery. We will give you a form for your primary physician to complete.  3. We make every effort to accommodate the date you request for surgery.  However, there are times where surgery dates or times have to be moved.  We will contact you as soon as possible if a change in schedule is required.   4. No aspirin/ibuprofen for one week before surgery.  If you are on aspirin, any non-steroidal anti-inflammatory medications (Mobic, Aleve, Ibuprofen) should not be taken seven (7) days prior to your surgery.  You make take Tylenol for pain prior to surgery.  5. Medications - If you are taking daily heart and blood pressure medications, seizure, reflux, allergy, asthma, anxiety, pain or diabetes medications, make sure you notify the surgery center/hospital before the day of surgery so they can tell you which medications you should take or avoid the day of surgery. 6. No food or drink after midnight the night before surgery unless directed otherwise by surgical center/hospital staff. 7. No alcoholic beverages 24-hours prior to surgery.  No smoking 24-hours prior or 24-hours after  surgery. 8. Wear loose pants or shorts. They should be loose enough to fit over bandages, boots, and casts. 9. Don't wear slip-on shoes. Sneakers are preferred. 10. Bring your boot with you to the surgery center/hospital.  Also bring crutches or a walker if your physician has prescribed it for you.  If you do not have this equipment, it will be provided for you after surgery. 11. If you have not been contacted by the surgery center/hospital by the day before your surgery, call to confirm the date and time of your surgery. 12. Leave-time from work may vary depending on the type of surgery you have.  Appropriate arrangements should be made prior to surgery with your employer. 13. Prescriptions will be provided immediately following surgery by your doctor.  Fill these as soon as possible after surgery and take the medication as directed. Pain medications will not be refilled on weekends and must be approved by the doctor. 14. Remove nail polish on the operative foot and avoid getting pedicures prior to surgery. 15. Wash the night before surgery.  The night before surgery wash the foot and leg well with water and the antibacterial soap provided. Be sure to pay special attention to beneath the toenails and in between the toes.  Wash for at least three (3) minutes. Rinse thoroughly with water and dry well with a towel.  Perform this wash unless told not to do so by your physician.  Enclosed: 1 Ice pack (please put in freezer the night before surgery)   1 Hibiclens skin cleaner     Pre-op instructions  If you have any questions regarding the instructions, please do not hesitate to call our office.  [place]: [address], [place], [place] [address] -- [phone]  [place]: [address] [place], [place] [address] -- [phone]  [place]: [address]  [place],  [address] -- [phone]  [place]: [address], [address], [place],  [address] -- [phone]  Website:  [email or web address] 

## 2019-03-01 ENCOUNTER — Telehealth: Payer: Self-pay | Admitting: *Deleted

## 2019-03-01 NOTE — Telephone Encounter (Signed)
"  I'm scheduled to have surgery tomorrow.  I don't know what time I need to get there."  Someone from the surgical center normally calls the Friday or the Monday prior to your surgery date.  They will give you your arrival time.  You can call them as well to get the arrival time.  The number to the surgical center is on the back of your brochure that we gave you.  "Okay, thank you."

## 2019-03-02 ENCOUNTER — Encounter: Payer: Self-pay | Admitting: Podiatry

## 2019-03-02 DIAGNOSIS — M2011 Hallux valgus (acquired), right foot: Secondary | ICD-10-CM | POA: Diagnosis not present

## 2019-03-02 DIAGNOSIS — M21611 Bunion of right foot: Secondary | ICD-10-CM | POA: Diagnosis not present

## 2019-03-02 DIAGNOSIS — I10 Essential (primary) hypertension: Secondary | ICD-10-CM | POA: Diagnosis not present

## 2019-03-03 ENCOUNTER — Telehealth: Payer: Self-pay | Admitting: *Deleted

## 2019-03-03 ENCOUNTER — Ambulatory Visit: Payer: BLUE CROSS/BLUE SHIELD | Admitting: Podiatry

## 2019-03-03 DIAGNOSIS — M2010 Hallux valgus (acquired), unspecified foot: Secondary | ICD-10-CM | POA: Diagnosis not present

## 2019-03-03 NOTE — Telephone Encounter (Signed)
Called and spoke with the patient and patient has an ice pack on the foot and there is some pain and has taken the pain medicine every 4 hours and can take ibuprofen and has been elevating and icing and stated to just limit walking to 15 minutes on the hour and to call the [place] office if any concerns or questions at [phone]. Misty Stanley

## 2019-03-05 ENCOUNTER — Encounter: Payer: Self-pay | Admitting: Podiatry

## 2019-03-06 ENCOUNTER — Other Ambulatory Visit: Payer: Self-pay

## 2019-03-06 DIAGNOSIS — E119 Type 2 diabetes mellitus without complications: Secondary | ICD-10-CM

## 2019-03-08 ENCOUNTER — Ambulatory Visit: Payer: BLUE CROSS/BLUE SHIELD | Admitting: Emergency Medicine

## 2019-03-09 ENCOUNTER — Telehealth (INDEPENDENT_AMBULATORY_CARE_PROVIDER_SITE_OTHER): Payer: BLUE CROSS/BLUE SHIELD | Admitting: Emergency Medicine

## 2019-03-09 ENCOUNTER — Other Ambulatory Visit: Payer: Self-pay

## 2019-03-09 DIAGNOSIS — I10 Essential (primary) hypertension: Secondary | ICD-10-CM | POA: Diagnosis not present

## 2019-03-09 DIAGNOSIS — E1169 Type 2 diabetes mellitus with other specified complication: Secondary | ICD-10-CM

## 2019-03-09 DIAGNOSIS — E1159 Type 2 diabetes mellitus with other circulatory complications: Secondary | ICD-10-CM | POA: Diagnosis not present

## 2019-03-09 DIAGNOSIS — E785 Hyperlipidemia, unspecified: Secondary | ICD-10-CM | POA: Diagnosis not present

## 2019-03-09 DIAGNOSIS — E119 Type 2 diabetes mellitus without complications: Secondary | ICD-10-CM

## 2019-03-09 NOTE — Progress Notes (Addendum)
Telemedicine Encounter- SOAP NOTE Established Patient This telephone encounter was conducted with the patient's (or proxy's) verbal consent via audio telecommunications: yes/no: Yes Patient was instructed to have this encounter in a suitably private space; and to only have persons present to whom they give permission to participate. In addition, patient identity was confirmed by use of name plus two identifiers (DOB and address). I spent a total of TIME; 0 MIN TO 60 MIN: 15 minutes talking with the patient or their proxy. No chief complaint on file.  Subjective  Walter Reynolds. is a 48 y.o. male established patient. Telephone visit today for follow-up of chronic medical conditions.  Patient has hypertension, diabetes, and dyslipidemia.  Recently had surgery on his foot, bunionectomy,?  Gout.  Doing well has no complaints or medical concerns today.  Does not take blood pressure at home.  Does not check glucose at home.  Scheduled for blood work tomorrow. HPI ? Patient Active Problem List   Diagnosis Date Noted  . Dyslipidemia 09/09/2018  . Environmental allergies 09/09/2018  . Ingrowing toenail 04/08/2018  . BMI 39.0-39.9,adult 09/03/2017  . Type 2 diabetes mellitus without complication, without long-term current use of insulin (HCC) 09/03/2017  . Anemia 04/14/2017  . Paresthesia 02/12/2017  . Hyperlipidemia 05/02/2016  . RBC microcytosis 05/02/2016  . OSA (obstructive sleep apnea) 09/06/2015  . Benign essential HTN 09/06/2015  . Metabolic syndrome 06/13/2014   Past Medical History:  Diagnosis Date  . Allergy   . Diabetes mellitus without complication (HCC)   . Elevated cholesterol   . Hernia    bi lateral hernia repair  . History of kidney stones   . Hypertension   . Sleep apnea 2014   severe  . Umbilical hernia without obstruction and without gangrene 09/03/2017   Current Outpatient Medications  Medication Sig Dispense Refill  . atorvastatin (LIPITOR) 20 MG tablet Take 1  tablet (20 mg total) by mouth daily. 90 tablet 3  . metFORMIN (GLUCOPHAGE-XR) 500 MG 24 hr tablet Take 2 tablets (1,000 mg total) by mouth daily with breakfast. 180 tablet 3  . diclofenac (VOLTAREN) 75 MG EC tablet Take 1 tablet (75 mg total) by mouth 2 (two) times daily. (Patient not taking: Reported on 03/09/2019) 50 tablet 2  . fluticasone (FLONASE) 50 MCG/ACT nasal spray Place 2 sprays into both nostrils daily. (Patient not taking: Reported on 03/09/2019) 16 g 12  . lisinopril (PRINIVIL,ZESTRIL) 20 MG tablet TAKE 1 TABLET BY MOUTH EVERY DAY (Patient not taking: Reported on 03/09/2019) 90 tablet 3  . meloxicam (MOBIC) 15 MG tablet Take 15 mg by mouth daily as needed.  2  . neomycin-polymyxin-hydrocortisone (CORTISPORIN) OTIC solution Apply 1-2 drops to toe after soaking twice a day (Patient not taking: Reported on 03/09/2019) 10 mL 0   No current facility-administered medications for this visit.    Allergies  Allergen Reactions  . Doxycycline Hives  . Erythromycin Base Hives   Social History   Socioeconomic History  . Marital status: Married    Spouse name: WENDELL WOLLE  . Number of children: 2  . Years of education: Associates  . Highest education level: Not on file  Occupational History  . Occupation: Merchandiser, retail    Comment: [employer]  Social Needs  . Financial resource strain: Not on file  . Food insecurity:    Worry: Not on file    Inability: Not on file  . Transportation needs:    Medical: Not on file    Non-medical: Not on file  Tobacco Use  . Smoking status: Never Smoker  . Smokeless tobacco: Never Used  Substance and Sexual Activity  . Alcohol use: No    Alcohol/week: 0.0 standard drinks  . Drug use: No  . Sexual activity: Yes    Partners: Female    Birth control/protection: Condom  Lifestyle  . Physical activity:    Days per week: Not on file    Minutes per session: Not on file  . Stress: Not on file  Relationships  . Social connections:    Talks on phone:  Not on file    Gets together: Not on file    Attends religious service: Not on file    Active member of club or organization: Not on file    Attends meetings of clubs or organizations: Not on file    Relationship status: Not on file  . Intimate partner violence:    Fear of current or ex partner: Not on file    Emotionally abused: Not on file    Physically abused: Not on file    Forced sexual activity: Not on file  Other Topics Concern  . Not on file  Social History Narrative   Lives with his wife and their 2 children.   He has 3 sons from a previous relationship that live independently.   Formerly in the [employer].   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   ROS unremarkable and noncontributory Objective  Vitals as reported by the patient: None at home. There were no vitals filed for this visit. There are no diagnoses linked to this encounter. Assessment and plan: 1.  Hypertension.  Continue present medications. 2.  Diabetes type 2.  Continue present medications. 3.  Dyslipidemia.  Continue present medications Blood work Advertising account executive.  Follow-up in 3 to 6 months. Diagnoses and all orders for this visit:  Type 2 diabetes mellitus without complication, without long-term current use of insulin (HCC)  Dyslipidemia  Benign essential HTN  Dyslipidemia associated with type 2 diabetes mellitus (HCC)  Hypertension associated with diabetes (HCC)    I discussed the assessment and treatment plan with the patient. The patient was provided an opportunity to ask questions and all were answered. The patient agreed with the plan and demonstrated an understanding of the instructions.   The patient was advised to call back or seek an in-person evaluation if the symptoms worsen or if the condition fails to improve as anticipated.  I provided 15 minutes of non-face-to-face time during this encounter.

## 2019-03-10 ENCOUNTER — Other Ambulatory Visit: Payer: Self-pay

## 2019-03-10 ENCOUNTER — Ambulatory Visit (INDEPENDENT_AMBULATORY_CARE_PROVIDER_SITE_OTHER): Payer: BLUE CROSS/BLUE SHIELD | Admitting: Emergency Medicine

## 2019-03-10 DIAGNOSIS — E119 Type 2 diabetes mellitus without complications: Secondary | ICD-10-CM

## 2019-03-11 ENCOUNTER — Ambulatory Visit (INDEPENDENT_AMBULATORY_CARE_PROVIDER_SITE_OTHER): Payer: BLUE CROSS/BLUE SHIELD

## 2019-03-11 ENCOUNTER — Other Ambulatory Visit: Payer: Self-pay

## 2019-03-11 ENCOUNTER — Ambulatory Visit (INDEPENDENT_AMBULATORY_CARE_PROVIDER_SITE_OTHER): Payer: BLUE CROSS/BLUE SHIELD | Admitting: Podiatry

## 2019-03-11 ENCOUNTER — Encounter: Payer: Self-pay | Admitting: Podiatry

## 2019-03-11 DIAGNOSIS — M2012 Hallux valgus (acquired), left foot: Secondary | ICD-10-CM

## 2019-03-11 DIAGNOSIS — M21612 Bunion of left foot: Principal | ICD-10-CM

## 2019-03-11 DIAGNOSIS — M79671 Pain in right foot: Secondary | ICD-10-CM

## 2019-03-11 LAB — COMPREHENSIVE METABOLIC PANEL
ALT: 50 IU/L — ABNORMAL HIGH (ref 0–44)
AST: 29 IU/L (ref 0–40)
Albumin/Globulin Ratio: 1.8 (ref 1.2–2.2)
Albumin: 4.4 g/dL (ref 4.0–5.0)
Alkaline Phosphatase: 87 IU/L (ref 39–117)
BUN/Creatinine Ratio: 14 (ref 9–20)
BUN: 14 mg/dL (ref 6–24)
Bilirubin Total: 0.2 mg/dL (ref 0.0–1.2)
CO2: 23 mmol/L (ref 20–29)
Calcium: 9.2 mg/dL (ref 8.7–10.2)
Chloride: 102 mmol/L (ref 96–106)
Creatinine, Ser: 0.98 mg/dL (ref 0.76–1.27)
GFR calc Af Amer: 104 mL/min/{1.73_m2} (ref >59)
GFR calc non Af Amer: 90 mL/min/{1.73_m2} (ref >59)
Globulin, Total: 2.5 g/dL (ref 1.5–4.5)
Glucose: 125 mg/dL — ABNORMAL HIGH (ref 65–99)
POTASSIUM: 4.4 mmol/L (ref 3.5–5.2)
Sodium: 143 mmol/L (ref 134–144)
Total Protein: 6.9 g/dL (ref 6.0–8.5)

## 2019-03-11 LAB — HEMOGLOBIN A1C
Est. average glucose Bld gHb Est-mCnc: 166 mg/dL
Hgb A1c MFr Bld: 7.4 % — ABNORMAL HIGH (ref 4.8–5.6)

## 2019-03-11 LAB — LIPID PANEL
Chol/HDL Ratio: 5.8 ratio — ABNORMAL HIGH (ref 0.0–5.0)
Cholesterol, Total: 175 mg/dL (ref 100–199)
HDL: 30 mg/dL — AB (ref >39)
LDL Calculated: 105 mg/dL — ABNORMAL HIGH (ref 0–99)
Triglycerides: 198 mg/dL — ABNORMAL HIGH (ref 0–149)
VLDL Cholesterol Cal: 40 mg/dL (ref 5–40)

## 2019-03-11 LAB — MICROALBUMIN, URINE: Microalbumin, Urine: 9.2 ug/mL

## 2019-03-11 NOTE — Progress Notes (Signed)
Subjective:   Patient ID: Walter Reynolds., male   DOB: 48 y.o.   MRN: [id number]   HPI Patient's right foot is doing very well and he states is quite a bit better than his left one when he had surgery on that one.  States that he is having no problems with motion or any other pathology and is very pleased so far with results   ROS      Objective:  Physical Exam  Neurovascular status intact negative Homans sign noted with patient's right incision site first MPJ healing well wound edges well coapted with good range of motion and no crepitus of the joint     Assessment:  Doing well post osteotomy first metatarsal of the right foot     Plan:  Reviewed continued conservative care continued elevation compression and immobilization and reappoint 3 weeks and encourage range of motion exercises  X-rays indicate osteotomies healing well joint is open and congruence with no signs of pathology

## 2019-03-12 ENCOUNTER — Encounter: Payer: Self-pay | Admitting: Emergency Medicine

## 2019-03-12 ENCOUNTER — Other Ambulatory Visit: Payer: BLUE CROSS/BLUE SHIELD

## 2019-03-16 ENCOUNTER — Other Ambulatory Visit: Payer: Self-pay | Admitting: *Deleted

## 2019-03-16 DIAGNOSIS — E119 Type 2 diabetes mellitus without complications: Secondary | ICD-10-CM

## 2019-03-16 MED ORDER — METFORMIN HCL ER 500 MG PO TB24: 1000.0000 mg | ORAL_TABLET | Freq: Every day | ORAL | 1 refills | Status: DC

## 2019-03-16 MED ORDER — ATORVASTATIN CALCIUM 20 MG PO TABS: 20.0000 mg | ORAL_TABLET | Freq: Every day | ORAL | 1 refills | Status: DC

## 2019-03-17 ENCOUNTER — Telehealth: Payer: Self-pay | Admitting: *Deleted

## 2019-03-17 NOTE — Telephone Encounter (Signed)
Pt called states there are some discrepancies in his "LOA" paperwork concerning dates.

## 2019-03-20 ENCOUNTER — Encounter: Payer: Self-pay | Admitting: Podiatry

## 2019-03-24 ENCOUNTER — Encounter: Payer: Self-pay | Admitting: Podiatry

## 2019-03-24 ENCOUNTER — Ambulatory Visit (INDEPENDENT_AMBULATORY_CARE_PROVIDER_SITE_OTHER): Payer: BLUE CROSS/BLUE SHIELD

## 2019-03-24 ENCOUNTER — Ambulatory Visit (INDEPENDENT_AMBULATORY_CARE_PROVIDER_SITE_OTHER): Payer: BLUE CROSS/BLUE SHIELD | Admitting: Podiatry

## 2019-03-24 ENCOUNTER — Other Ambulatory Visit: Payer: Self-pay

## 2019-03-24 VITALS — Temp 97.7°F

## 2019-03-24 DIAGNOSIS — M779 Enthesopathy, unspecified: Secondary | ICD-10-CM

## 2019-03-24 DIAGNOSIS — M2011 Hallux valgus (acquired), right foot: Secondary | ICD-10-CM

## 2019-03-24 DIAGNOSIS — M21612 Bunion of left foot: Secondary | ICD-10-CM

## 2019-03-24 DIAGNOSIS — M2012 Hallux valgus (acquired), left foot: Secondary | ICD-10-CM

## 2019-03-25 NOTE — Progress Notes (Signed)
Subjective:   Patient ID: Walter Reynolds., male   DOB: 48 y.o.   MRN: [id number]   HPI Patient presents stating that he seems to be doing okay but he is also concerned about his ability to walk comfortably with this deformity   ROS      Objective:  Physical Exam  Neurovascular status intact with patient's right first MPJ healing well wound edges well coapted minimal swelling and patient having a pathological examination of granuloma which does not indicate currently that it is gout in this particular situation.  Range of motion is good with no crepitus postoperatively     Assessment:  Overall doing pretty well with well-healing surgical site first MPJ right with wound edges well coapted and good range of motion     Plan:  X-rays dated today and at this point we can allow the patient to gradually increase activities and slowly return to soft shoes but continue to be careful with this.  Reviewed lab work with him and do not indicate gout and I do think it was an inflammatory process occurring.  Cathlean Sauer indicates that there is good healing of the osteotomy fixation in place joint congruence

## 2019-04-13 ENCOUNTER — Ambulatory Visit: Payer: BLUE CROSS/BLUE SHIELD

## 2019-04-14 ENCOUNTER — Other Ambulatory Visit: Payer: Self-pay

## 2019-04-14 ENCOUNTER — Ambulatory Visit (INDEPENDENT_AMBULATORY_CARE_PROVIDER_SITE_OTHER): Payer: BLUE CROSS/BLUE SHIELD

## 2019-04-14 ENCOUNTER — Ambulatory Visit (INDEPENDENT_AMBULATORY_CARE_PROVIDER_SITE_OTHER): Payer: BLUE CROSS/BLUE SHIELD | Admitting: Podiatry

## 2019-04-14 ENCOUNTER — Encounter: Payer: Self-pay | Admitting: Podiatry

## 2019-04-14 VITALS — Temp 97.7°F

## 2019-04-14 DIAGNOSIS — M2011 Hallux valgus (acquired), right foot: Secondary | ICD-10-CM | POA: Diagnosis not present

## 2019-04-14 NOTE — Progress Notes (Signed)
Subjective:   Patient ID: Walter Reynolds., male   DOB: 48 y.o.   MRN: [id number]   HPI Patient states he is improving quite a bit with mild discomfort and does get some burning at the end of his toe that occurs sporadically   ROS      Objective:  Physical Exam  Neurovascular status intact with patient's right first MPJ range of motion excellent with no crepitus of the joint no pain when I palpated with ingrown toenail corrected medial border right hallux with mild deformity of the lateral border     Assessment:  Overall doing well with the postoperative correction with normal amount of discomfort at 6 weeks postop with possibility for ingrown toenail right hallux lateral border     Plan:  H&P conditions reviewed x-ray reviewed and today I recommended continued range of motion exercises anti-inflammatories physical therapy and patient will be seen back for [place] to recheck again as needed and he can gradually return to normal activities  X-rays indicate excellent shortening first metatarsal right with fixation in place joint congruence no indication of deformity

## 2019-05-23 ENCOUNTER — Other Ambulatory Visit: Payer: Self-pay | Admitting: Emergency Medicine

## 2019-05-23 ENCOUNTER — Encounter: Payer: Self-pay | Admitting: Emergency Medicine

## 2019-05-23 ENCOUNTER — Other Ambulatory Visit: Payer: Self-pay | Admitting: Podiatry

## 2019-05-23 DIAGNOSIS — I10 Essential (primary) hypertension: Secondary | ICD-10-CM

## 2019-05-24 ENCOUNTER — Ambulatory Visit (INDEPENDENT_AMBULATORY_CARE_PROVIDER_SITE_OTHER): Payer: BC Managed Care – PPO | Admitting: Podiatry

## 2019-05-24 ENCOUNTER — Other Ambulatory Visit: Payer: Self-pay

## 2019-05-24 ENCOUNTER — Ambulatory Visit (INDEPENDENT_AMBULATORY_CARE_PROVIDER_SITE_OTHER): Payer: BC Managed Care – PPO

## 2019-05-24 ENCOUNTER — Encounter: Payer: Self-pay | Admitting: Podiatry

## 2019-05-24 DIAGNOSIS — I10 Essential (primary) hypertension: Secondary | ICD-10-CM

## 2019-05-24 DIAGNOSIS — M2011 Hallux valgus (acquired), right foot: Secondary | ICD-10-CM | POA: Diagnosis not present

## 2019-05-24 DIAGNOSIS — L600 Ingrowing nail: Secondary | ICD-10-CM | POA: Diagnosis not present

## 2019-05-24 MED ORDER — DICLOFENAC SODIUM 75 MG PO TBEC: 75.0000 mg | DELAYED_RELEASE_TABLET | Freq: Two times a day (BID) | ORAL | 2 refills | Status: DC

## 2019-05-24 MED ORDER — LISINOPRIL 20 MG PO TABS: 20.0000 mg | ORAL_TABLET | Freq: Every day | ORAL | 0 refills | Status: DC

## 2019-05-24 NOTE — Telephone Encounter (Signed)
Requested medication (s) are due for refill today: Yes  Requested medication (s) are on the active medication list: Yes  Last refill:  04/07/18  Future visit scheduled: No  Notes to clinic: Unable to refill, expired Rx     Requested Prescriptions  Pending Prescriptions Disp Refills   lisinopril (ZESTRIL) 20 MG tablet [Pharmacy Med Name: LISINOPRIL 20 MG TABLET] 90 tablet 3    Sig: TAKE 1 TABLET BY MOUTH EVERY DAY     Cardiovascular:  ACE Inhibitors Failed - 05/23/2019 11:05 PM      Failed - Last BP in normal range    BP Readings from Last 1 Encounters:  02/10/19 (!) 143/89         Failed - Valid encounter within last 6 months    Recent Outpatient Visits          2 months ago Type 2 diabetes mellitus without complication, without long-term current use of insulin ([place])   Primary Care at [place], [place], [place]   8 months ago Type 2 diabetes mellitus without complication, without long-term current use of insulin [facility])   Primary Care at [facility], [place], [place]   11 months ago Type 2 diabetes mellitus without complication, without long-term current use of insulin [facility])   Primary Care at [place], [place], [place]   1 year ago Ingrowing toenail   Primary Care at [facility], [place], [place]   1 year ago Well controlled type 2 diabetes mellitus [facility])   Primary Care at [facility], [place], [place] - Cr in normal range and within 180 days    Creat  Date Value Ref Range Status  05/02/2016 0.96 0.60 - 1.35 mg/dL Final   Creatinine, Ser  Date Value Ref Range Status  03/10/2019 0.98 0.76 - 1.27 mg/dL Final         Passed - K in normal range and within 180 days    Potassium  Date Value Ref Range Status  03/10/2019 4.4 3.5 - 5.2 mmol/L Final         Passed - Patient is not pregnant

## 2019-05-24 NOTE — Patient Instructions (Signed)

## 2019-05-26 NOTE — Progress Notes (Signed)
Subjective:   Patient ID: Walter Reynolds., male   DOB: 48 y.o.   MRN: [id number]   HPI Patient states that the pain in his right foot is present but better than previous and gradually getting better but he has an ingrown toenail of the right big toe lateral border that he needs to have corrected   ROS      Objective:  Physical Exam  Neurovascular status intact with incurvated right hallux lateral border that is painful when pressed with no drainage or redness noted.  Overall first MPJ is healing well with mild swelling still noted when he does too much but overall is gradually making progress with mild plantar pain     Assessment:  Ingrown toenail deformity right hallux with continued gradual healing of the first MPJ right     Plan:  H&P condition reviewed recommended correction of the ingrown toenail.  Patient wants surgery and I explained procedure to patient and today I infiltrated the right hallux 60 mg like Marcaine mixture explained procedure and he signed consent form under sterile conditions with sterile instrumentation I removed the lateral border exposed matrix applied phenol 3 applications 30 seconds followed by alcohol lavage sterile dressing and instructed on leaving dressing on 24 hours but take it off earlier if any throbbing were to occur.  Will be seen back and will continue to monitor the right foot  X-ray indicates the osteotomy appears to be healing well fixation in place joint congruence

## 2019-06-24 DIAGNOSIS — M65332 Trigger finger, left middle finger: Secondary | ICD-10-CM | POA: Insufficient documentation

## 2019-06-24 DIAGNOSIS — G5603 Carpal tunnel syndrome, bilateral upper limbs: Secondary | ICD-10-CM | POA: Diagnosis not present

## 2019-06-25 ENCOUNTER — Other Ambulatory Visit: Payer: Self-pay

## 2019-06-25 DIAGNOSIS — M65332 Trigger finger, left middle finger: Secondary | ICD-10-CM

## 2019-06-25 DIAGNOSIS — G5603 Carpal tunnel syndrome, bilateral upper limbs: Secondary | ICD-10-CM

## 2019-07-06 ENCOUNTER — Ambulatory Visit (INDEPENDENT_AMBULATORY_CARE_PROVIDER_SITE_OTHER): Payer: Self-pay | Admitting: Neurology

## 2019-07-06 ENCOUNTER — Other Ambulatory Visit: Payer: Self-pay

## 2019-07-06 DIAGNOSIS — M65332 Trigger finger, left middle finger: Secondary | ICD-10-CM

## 2019-07-06 DIAGNOSIS — G5603 Carpal tunnel syndrome, bilateral upper limbs: Secondary | ICD-10-CM

## 2019-07-06 NOTE — Procedures (Signed)
[facility]  [address], [address]  [place], [place] [address] Tel: [phone] Fax:  [phone] Test Date:  07/06/2019  Patient: Walter Reynolds, Jr. DOB: [birth year] Physician: Nita Sickleonika Kaleeyah Cuffie, DO  Sex: Male Height: 5\' 6"  Ref Phys: Dairl PonderMatthew Weingold, MD  ID#: [id number] Temp: 34.0C Technician:    Patient Complaints: This is a 48 year old man with history of bilateral carpal tunnel release referred for evaluation of bilateral hand paresthesias.  NCV & EMG Findings: Extensive electrodiagnostic testing of the right upper extremity and additional studies of the left shows:  1. Bilateral median sensory responses show prolonged distal peak latency (R4.0, L4.3 ms) and reduced amplitude (R12.2, L9.9 V).  Bilateral ulnar sensory responses are within normal limits. 2. Right median motor response shows prolonged distal onset latency (4.9 ms) and reduced amplitude (5.6 mV).  Left median motor response shows prolonged distal onset latency (4.7 ms) and normal amplitude.  Bilateral ulnar motor responses are within normal limits. 3. Chronic motor axonal loss changes are isolated to the right abductor pollicis brevis muscle; these findings are not present on the left upper extremity.  There is no evidence of accompanied active denervation.  Impression: Recurrent bilateral median neuropathy at or distal to the wrist, consistent with a clinical diagnosis of carpal tunnel syndrome.  Overall, these findings are severe in degree electrically and worse on the right.   ___________________________ Nita Sickleonika Alaena Strader, DO    Nerve Conduction Studies Anti Sensory Summary Table   Site NR Peak (ms) Norm Peak (ms) P-T Amp (V) Norm P-T Amp  Left Median Anti Sensory (2nd Digit)  34C  Wrist    4.3 <3.4 9.9 >20  Right Median Anti Sensory (2nd Digit)  34C  Wrist    4.0 <3.4 12.2 >20  Left Ulnar Anti Sensory (5th Digit)  34C  Wrist    3.1 <3.1 14.0 >12  Right Ulnar Anti Sensory (5th Digit)  34C   Wrist    2.7 <3.1 13.7 >12   Motor Summary Table   Site NR Onset (ms) Norm Onset (ms) O-P Amp (mV) Norm O-P Amp Site1 Site2 Delta-0 (ms) Dist (cm) Vel (m/s) Norm Vel (m/s)  Left Median Motor (Abd Poll Brev)  34C  Wrist    4.7 <3.9 7.1 >6 Elbow Wrist 5.5 30.0 55 >50  Elbow    10.2  6.6         Right Median Motor (Abd Poll Brev)  34C  Wrist    4.9 <3.9 5.6 >6 Elbow Wrist 4.5 29.0 64 >50  Elbow    9.4  5.4         Left Ulnar Motor (Abd Dig Minimi)  34C  Wrist    2.4 <3.1 9.1 >7 B Elbow Wrist 4.5 25.0 56 >50  B Elbow    6.9  8.8  A Elbow B Elbow 1.9 10.0 53 >50  A Elbow    8.8  8.4         Right Ulnar Motor (Abd Dig Minimi)  34C  Wrist    2.1 <3.1 9.8 >7 B Elbow Wrist 4.3 24.0 56 >50  B Elbow    6.4  9.1  A Elbow B Elbow 2.0 10.0 50 >50  A Elbow    8.4  8.9          EMG   Side Muscle Ins Act Fibs Psw Fasc Number Recrt Dur Dur. Amp Amp. Poly Poly. Comment  Right 1stDorInt Nml Nml Nml Nml Nml Nml Nml Nml Nml Nml  Nml Nml N/A  Right Abd Poll Brev Nml Nml Nml Nml 1- Rapid Some 1+ Some 1+ Nml Nml N/A  Right PronatorTeres Nml Nml Nml Nml Nml Nml Nml Nml Nml Nml Nml Nml N/A  Right Biceps Nml Nml Nml Nml Nml Nml Nml Nml Nml Nml Nml Nml N/A  Right Triceps Nml Nml Nml Nml Nml Nml Nml Nml Nml Nml Nml Nml N/A  Right Deltoid Nml Nml Nml Nml Nml Nml Nml Nml Nml Nml Nml Nml N/A  Left 1stDorInt Nml Nml Nml Nml Nml Nml Nml Nml Nml Nml Nml Nml N/A  Left Abd Poll Brev Nml Nml Nml Nml Nml Nml Nml Nml Nml Nml Nml Nml N/A  Left PronatorTeres Nml Nml Nml Nml Nml Nml Nml Nml Nml Nml Nml Nml N/A  Left Biceps Nml Nml Nml Nml Nml Nml Nml Nml Nml Nml Nml Nml N/A  Left Triceps Nml Nml Nml Nml Nml Nml Nml Nml Nml Nml Nml Nml N/A  Left Deltoid Nml Nml Nml Nml Nml Nml Nml Nml Nml Nml Nml Nml N/A      Waveforms:

## 2019-07-08 ENCOUNTER — Telehealth: Payer: Self-pay | Admitting: Neurology

## 2019-07-08 ENCOUNTER — Other Ambulatory Visit: Payer: Self-pay

## 2019-07-08 NOTE — Telephone Encounter (Signed)
New Message  Rise Paganini from [facility] verbalized she is needing report from 7.21.20 visit faxed to number below:  [phone]

## 2019-07-08 NOTE — Telephone Encounter (Signed)
Faxed again to [facility]

## 2019-07-22 ENCOUNTER — Encounter: Payer: Self-pay | Admitting: Emergency Medicine

## 2019-07-27 ENCOUNTER — Encounter: Payer: BLUE CROSS/BLUE SHIELD | Admitting: Neurology

## 2019-08-02 DIAGNOSIS — G5603 Carpal tunnel syndrome, bilateral upper limbs: Secondary | ICD-10-CM | POA: Diagnosis not present

## 2019-08-03 ENCOUNTER — Encounter: Payer: BLUE CROSS/BLUE SHIELD | Admitting: Neurology

## 2019-08-04 ENCOUNTER — Other Ambulatory Visit: Payer: Self-pay | Admitting: Emergency Medicine

## 2019-08-04 DIAGNOSIS — E119 Type 2 diabetes mellitus without complications: Secondary | ICD-10-CM

## 2019-08-15 ENCOUNTER — Other Ambulatory Visit: Payer: Self-pay | Admitting: Emergency Medicine

## 2019-08-15 DIAGNOSIS — I10 Essential (primary) hypertension: Secondary | ICD-10-CM

## 2019-08-15 NOTE — Telephone Encounter (Signed)
Requested medication (s) are due for refill today: yes  Requested medication (s) are on the active medication list: yes  Last refill:  05/24/2019  Future visit scheduled: no  Notes to clinic:  Patient needs appointment.    Requested Prescriptions  Pending Prescriptions Disp Refills   lisinopril (ZESTRIL) 20 MG tablet [Pharmacy Med Name: LISINOPRIL 20 MG TABLET] 30 tablet 2    Sig: TAKE 1 TABLET BY MOUTH EVERY DAY     Cardiovascular:  ACE Inhibitors Failed - 08/15/2019 12:13 AM      Failed - Last BP in normal range    BP Readings from Last 1 Encounters:  02/10/19 (!) 143/89         Failed - Valid encounter within last 6 months    Recent Outpatient Visits          5 months ago Type 2 diabetes mellitus without complication, without long-term current use of insulin [facility])   Primary Care at [facility], [place], [place]   11 months ago Type 2 diabetes mellitus without complication, without long-term current use of insulin [facility])   Primary Care at [place], [place], [place]   1 year ago Type 2 diabetes mellitus without complication, without long-term current use of insulin [facility])   Primary Care at [place], [place], [place]   1 year ago Ingrowing toenail   Primary Care at [facility], [place], [place]   1 year ago Well controlled type 2 diabetes mellitus [facility])   Primary Care at [facility], [place], [place] - Cr in normal range and within 180 days    Creat  Date Value Ref Range Status  05/02/2016 0.96 0.60 - 1.35 mg/dL Final   Creatinine, Ser  Date Value Ref Range Status  03/10/2019 0.98 0.76 - 1.27 mg/dL Final         Passed - K in normal range and within 180 days    Potassium  Date Value Ref Range Status  03/10/2019 4.4 3.5 - 5.2 mmol/L Final         Passed - Patient is not pregnant

## 2019-08-18 ENCOUNTER — Encounter: Payer: Self-pay | Admitting: Emergency Medicine

## 2019-08-18 DIAGNOSIS — G5601 Carpal tunnel syndrome, right upper limb: Secondary | ICD-10-CM | POA: Diagnosis not present

## 2019-08-26 DIAGNOSIS — G5603 Carpal tunnel syndrome, bilateral upper limbs: Secondary | ICD-10-CM | POA: Diagnosis not present

## 2019-08-26 DIAGNOSIS — G4733 Obstructive sleep apnea (adult) (pediatric): Secondary | ICD-10-CM | POA: Diagnosis not present

## 2019-09-01 ENCOUNTER — Other Ambulatory Visit: Payer: Self-pay | Admitting: Emergency Medicine

## 2019-09-01 DIAGNOSIS — E119 Type 2 diabetes mellitus without complications: Secondary | ICD-10-CM

## 2019-09-01 NOTE — Telephone Encounter (Signed)
Requested medication (s) are due for refill today: yes  Requested medication (s) are on the active medication list: yes  Last refill:  08/04/2019  Future visit scheduled: no  Notes to clinic:  Overdue for office visit    Requested Prescriptions  Pending Prescriptions Disp Refills   metFORMIN (GLUCOPHAGE-XR) 500 MG 24 hr tablet [Pharmacy Med Name: METFORMIN HCL ER 500 MG TABLET] 60 tablet 0    Sig: TAKE 2 TABLETS BY [place]     Endocrinology:  Diabetes - Biguanides Passed - 09/01/2019  3:25 PM      Passed - Cr in normal range and within 360 days    Creat  Date Value Ref Range Status  05/02/2016 0.96 0.60 - 1.35 mg/dL Final   Creatinine, Ser  Date Value Ref Range Status  03/10/2019 0.98 0.76 - 1.27 mg/dL Final         Passed - HBA1C is between 0 and 7.9 and within 180 days    Hgb A1c MFr Bld  Date Value Ref Range Status  03/10/2019 7.4 (H) 4.8 - 5.6 % Final    Comment:             Prediabetes: 5.7 - 6.4          Diabetes: >6.4          Glycemic control for adults with diabetes: <7.0          Passed - eGFR in normal range and within 360 days    GFR, Est African American  Date Value Ref Range Status  10/23/2015 79 >=60 mL/min Final   GFR calc Af Amer  Date Value Ref Range Status  03/10/2019 104 >59 mL/min/1.73 Final   GFR, Est Non African American  Date Value Ref Range Status  10/23/2015 68 >=60 mL/min Final    Comment:      The estimated GFR is a calculation valid for adults (>=85 years old) that uses the CKD-EPI algorithm to adjust for age and sex. It is   not to be used for children, pregnant women, hospitalized patients,    patients on dialysis, or with rapidly changing kidney function. According to the NKDEP, eGFR >89 is normal, 60-89 shows mild impairment, 30-59 shows moderate impairment, 15-29 shows severe impairment and <15 is ESRD.      GFR calc non Af Amer  Date Value Ref Range Status  03/10/2019 90 >59 mL/min/1.73 Final          Passed - Valid encounter within last 6 months    Recent Outpatient Visits          5 months ago Type 2 diabetes mellitus without complication, without long-term current use of insulin [facility])   Primary Care at [facility], [place], [place]   5 months ago Type 2 diabetes mellitus without complication, without long-term current use of insulin [facility])   Primary Care at [facility], [place], [place]   11 months ago Type 2 diabetes mellitus without complication, without long-term current use of insulin [facility])   Primary Care at [place], [place], [place]   1 year ago Type 2 diabetes mellitus without complication, without long-term current use of insulin [facility])   Primary Care at [facility], [place], [place]   1 year ago Ingrowing toenail   Primary Care at [facility], [place], [place]

## 2019-09-09 ENCOUNTER — Other Ambulatory Visit: Payer: Self-pay | Admitting: Emergency Medicine

## 2019-09-09 DIAGNOSIS — I10 Essential (primary) hypertension: Secondary | ICD-10-CM

## 2019-09-09 NOTE — Telephone Encounter (Signed)
Requested medication (s) are due for refill today: yes  Requested medication (s) are on the active medication list: yes  Last refill:  08/16/2019  Future visit scheduled: no  Notes to clinic:  Review for refill   Requested Prescriptions  Pending Prescriptions Disp Refills   lisinopril (ZESTRIL) 20 MG tablet [Pharmacy Med Name: LISINOPRIL 20 MG TABLET] 30 tablet 0    Sig: TAKE 1 TABLET BY MOUTH EVERY DAY     Cardiovascular:  ACE Inhibitors Failed - 09/09/2019 11:31 AM      Failed - Cr in normal range and within 180 days    Creat  Date Value Ref Range Status  05/02/2016 0.96 0.60 - 1.35 mg/dL Final   Creatinine, Ser  Date Value Ref Range Status  03/10/2019 0.98 0.76 - 1.27 mg/dL Final         Failed - K in normal range and within 180 days    Potassium  Date Value Ref Range Status  03/10/2019 4.4 3.5 - 5.2 mmol/L Final         Failed - Last BP in normal range    BP Readings from Last 1 Encounters:  02/10/19 (!) 143/89         Failed - Valid encounter within last 6 months    Recent Outpatient Visits          6 months ago Type 2 diabetes mellitus without complication, without long-term current use of insulin ([place])   Primary Care at [place], [place], [place]   6 months ago Type 2 diabetes mellitus without complication, without long-term current use of insulin [facility])   Primary Care at [place], [place], [place]   1 year ago Type 2 diabetes mellitus without complication, without long-term current use of insulin [facility])   Primary Care at [place], [place], [place]   1 year ago Type 2 diabetes mellitus without complication, without long-term current use of insulin [facility])   Primary Care at [facility], [place], [place]   1 year ago Ingrowing toenail   Primary Care at [place], [place], [place] - Patient is not pregnant

## 2019-09-14 ENCOUNTER — Other Ambulatory Visit: Payer: Self-pay | Admitting: Emergency Medicine

## 2019-09-14 DIAGNOSIS — Z9109 Other allergy status, other than to drugs and biological substances: Secondary | ICD-10-CM

## 2019-09-23 ENCOUNTER — Other Ambulatory Visit: Payer: Self-pay | Admitting: Family Medicine

## 2019-09-23 DIAGNOSIS — I10 Essential (primary) hypertension: Secondary | ICD-10-CM

## 2019-09-23 NOTE — Telephone Encounter (Signed)
Requested medication (s) are due for refill today: yes  Requested medication (s) are on the active medication list: yes  Last refill:  09/09/2019   Future visit scheduled: no  Notes to clinic:  Requesting 90 day supply   Requested Prescriptions  Pending Prescriptions Disp Refills   lisinopril (ZESTRIL) 20 MG tablet [Pharmacy Med Name: LISINOPRIL 20 MG TABLET] 90 tablet 1    Sig: TAKE 1 TABLET BY MOUTH EVERY DAY     Cardiovascular:  ACE Inhibitors Failed - 09/23/2019  8:39 AM      Failed - Cr in normal range and within 180 days    Creat  Date Value Ref Range Status  05/02/2016 0.96 0.60 - 1.35 mg/dL Final   Creatinine, Ser  Date Value Ref Range Status  03/10/2019 0.98 0.76 - 1.27 mg/dL Final         Failed - K in normal range and within 180 days    Potassium  Date Value Ref Range Status  03/10/2019 4.4 3.5 - 5.2 mmol/L Final         Failed - Last BP in normal range    BP Readings from Last 1 Encounters:  02/10/19 (!) 143/89         Failed - Valid encounter within last 6 months    Recent Outpatient Visits          6 months ago Type 2 diabetes mellitus without complication, without long-term current use of insulin ([place])   [facility] at [place], [place], [place]   6 months ago Type 2 diabetes mellitus without complication, without long-term current use of insulin [facility])   Primary [facility] at [place], [place], [place]   1 year ago Type 2 diabetes mellitus without complication, without long-term current use of insulin [facility])   [facility] at [place], [place], [place]   1 year ago Type 2 diabetes mellitus without complication, without long-term current use of insulin [facility])   Primary Care at [facility], Ines [place]er, MD   1 year ago [place] toenail   [facility] at [place], [place], [place] - Patient is not pregnant

## 2019-09-23 NOTE — Telephone Encounter (Signed)
Pt needs appt

## 2019-09-24 NOTE — Telephone Encounter (Signed)
Spoke with pt and scheduled appt. Pt stated he is having surgery on Wednesday.

## 2019-09-27 ENCOUNTER — Ambulatory Visit (INDEPENDENT_AMBULATORY_CARE_PROVIDER_SITE_OTHER): Payer: BC Managed Care – PPO | Admitting: Emergency Medicine

## 2019-09-27 ENCOUNTER — Encounter: Payer: Self-pay | Admitting: Emergency Medicine

## 2019-09-27 ENCOUNTER — Other Ambulatory Visit: Payer: Self-pay

## 2019-09-27 VITALS — BP 138/85 | HR 74 | Temp 98.5°F | Resp 16 | Ht 67.0 in | Wt 259.2 lb

## 2019-09-27 DIAGNOSIS — Z23 Encounter for immunization: Secondary | ICD-10-CM

## 2019-09-27 DIAGNOSIS — I10 Essential (primary) hypertension: Secondary | ICD-10-CM | POA: Diagnosis not present

## 2019-09-27 DIAGNOSIS — E1169 Type 2 diabetes mellitus with other specified complication: Secondary | ICD-10-CM | POA: Diagnosis not present

## 2019-09-27 DIAGNOSIS — I152 Hypertension secondary to endocrine disorders: Secondary | ICD-10-CM | POA: Insufficient documentation

## 2019-09-27 DIAGNOSIS — Z6841 Body Mass Index (BMI) 40.0 and over, adult: Secondary | ICD-10-CM

## 2019-09-27 DIAGNOSIS — E785 Hyperlipidemia, unspecified: Secondary | ICD-10-CM | POA: Diagnosis not present

## 2019-09-27 DIAGNOSIS — G4733 Obstructive sleep apnea (adult) (pediatric): Secondary | ICD-10-CM | POA: Diagnosis not present

## 2019-09-27 DIAGNOSIS — E1159 Type 2 diabetes mellitus with other circulatory complications: Secondary | ICD-10-CM | POA: Diagnosis not present

## 2019-09-27 LAB — POCT GLYCOSYLATED HEMOGLOBIN (HGB A1C): Hemoglobin A1C: 8.3 % — AB (ref 4.0–5.6)

## 2019-09-27 LAB — GLUCOSE, POCT (MANUAL RESULT ENTRY): POC Glucose: 262 mg/dl — AB (ref 70–99)

## 2019-09-27 MED ORDER — FARXIGA 5 MG PO TABS: 5.0000 mg | ORAL_TABLET | Freq: Every day | ORAL | 5 refills | Status: DC

## 2019-09-27 MED ORDER — ATORVASTATIN CALCIUM 20 MG PO TABS: 20.0000 mg | ORAL_TABLET | Freq: Every day | ORAL | 3 refills | Status: DC

## 2019-09-27 MED ORDER — LISINOPRIL 20 MG PO TABS: 20.0000 mg | ORAL_TABLET | Freq: Every day | ORAL | 3 refills | Status: DC

## 2019-09-27 MED ORDER — METFORMIN HCL 1000 MG PO TABS: 1000.0000 mg | ORAL_TABLET | Freq: Two times a day (BID) | ORAL | 3 refills | Status: DC

## 2019-09-27 NOTE — Assessment & Plan Note (Signed)
Well controlled blood pressure.  Continue present medication.  No changes. Uncontrolled diabetes with hemoglobin A1c at 8.3 higher than before.  Increase metformin to 1000 mg twice a day and add SGLT inhibitor as per formulary.  Advised on nutrition and physical activity.  Follow-up in 3 months.

## 2019-09-27 NOTE — Progress Notes (Signed)
Lab Results  Component Value Date   HGBA1C 7.4 (H) 03/10/2019   BP Readings from Last 3 Encounters:  09/27/19 138/85  02/10/19 (!) 143/89  09/09/18 127/78   Lab Results  Component Value Date   CHOL 175 03/10/2019   HDL 30 (L) 03/10/2019   LDLCALC 105 (H) 03/10/2019   TRIG 198 (H) 03/10/2019   CHOLHDL 5.8 (H) 03/10/2019   Lab Results  Component Value Date   CREATININE 0.98 03/10/2019   BUN 14 03/10/2019   NA 143 03/10/2019   K 4.4 03/10/2019   CL 102 03/10/2019   CO2 23 03/10/2019   Walter Reynolds. 48 y.o.   Chief Complaint  Patient presents with  . Medication Refill    Atorvastatin, Metformin and Lisinopril    HISTORY OF PRESENT ILLNESS: This is a 48 y.o. male with history of hypertension, diabetes, dyslipidemia, here for follow-up. 1.  Diabetes: On metformin 1000 mg daily. 2.  Hypertension: On lisinopril 20 mg daily. 3.  Dyslipidemia: On Lipitor 20 mg daily. Recently had carpal tunnel syndrome surgery on the right wrist.  Scheduled for left wrist in the near future. Also seen podiatrist for ingrown toenails. Has no complaints or medical concerns today. Needs medication refill.  HPI   Prior to Admission medications   Medication Sig Start Date End Date Taking? Authorizing Walter Reynolds  atorvastatin (LIPITOR) 20 MG tablet Take 1 tablet (20 mg total) by mouth daily. 03/16/19  Yes Walter Quint, MD  diclofenac (VOLTAREN) 75 MG EC tablet TAKE 1 TABLET BY MOUTH TWICE A DAY 05/24/19  Yes Reynolds, Walter Reynolds, DPM  diclofenac (VOLTAREN) 75 MG EC tablet Take 1 tablet (75 mg total) by mouth 2 (two) times daily. 05/24/19  Yes Reynolds, Walter Reynolds, DPM  fluticasone (FLONASE) 50 MCG/ACT nasal spray SPRAY 2 SPRAYS INTO EACH NOSTRIL EVERY DAY 09/14/19  Yes Walter Reynolds, Walter Kempf, MD  lisinopril (ZESTRIL) 20 MG tablet TAKE 1 TABLET BY MOUTH EVERY DAY 09/23/19  Yes Walter Reynolds, Walter Kempf, MD  meloxicam (MOBIC) 15 MG tablet Take 15 mg by mouth daily as needed. 01/15/18  Yes Walter Reynolds,  Historical, MD  metFORMIN (GLUCOPHAGE-XR) 500 MG 24 hr tablet TAKE 2 TABLETS BY MOUTH EVERY DAY WITH BREAKFAST 08/04/19  Yes Walter Reynolds, Walter Kempf, MD  neomycin-polymyxin-hydrocortisone (CORTISPORIN) OTIC solution Apply 1-2 drops to toe after soaking twice a day 02/15/19  Yes Reynolds, Walter Reynolds, DPM    Allergies  Allergen Reactions  . Doxycycline Hives  . Erythromycin Base Hives    Patient Active Problem List   Diagnosis Date Noted  . Dyslipidemia 09/09/2018  . Environmental allergies 09/09/2018  . BMI 39.0-39.9,adult 09/03/2017  . Type 2 diabetes mellitus without complication, without long-term current use of insulin (HCC) 09/03/2017  . Anemia 04/14/2017  . Hyperlipidemia 05/02/2016  . OSA (obstructive sleep apnea) 09/06/2015  . Benign essential HTN 09/06/2015    Past Medical History:  Diagnosis Date  . Allergy   . Diabetes mellitus without complication (HCC)   . Elevated cholesterol   . Hernia    bi lateral hernia repair  . History of kidney stones   . Hypertension   . Sleep apnea 2014   severe  . Umbilical hernia without obstruction and without gangrene 09/03/2017    Past Surgical History:  Procedure Laterality Date  . CARPAL TUNNEL RELEASE Right 2007  . CARPAL TUNNEL RELEASE Left 10/28/2014   Procedure: LEFT CARPAL TUNNEL RELEASE;  Surgeon: Walter Ponder, MD;  Location: [facility];  Service: Orthopedics;  Laterality: Left;  . FOOT SURGERY Left   . INGUINAL HERNIA REPAIR     times two, each side  . KIDNEY STONE SURGERY    . MASS EXCISION Left 10/28/2014   Procedure: LEFT WRIST VOLAR MASS EXCISION;  Surgeon: Walter Ponder, MD;  Location:  SURGERY CENTER;  Service: Orthopedics;  Laterality: Left;  . UMBILICAL HERNIA REPAIR N/A 10/27/2017   Procedure: HERNIA REPAIR UMBILICAL ADULT;  Surgeon: Walter Bue, MD;  Location: [facility] OR;  Service: General;  Laterality: N/A;    Social History   Socioeconomic History  . Marital status: Married     Spouse name: Walter Reynolds  . Number of children: 2  . Years of education: Associates  . Highest education level: Not on file  Occupational History  . Occupation: Merchandiser, retail    Comment: [employer]  Social Needs  . Financial resource strain: Not on file  . Food insecurity    Worry: Not on file    Inability: Not on file  . Transportation needs    Medical: Not on file    Non-medical: Not on file  Tobacco Use  . Smoking status: Never Smoker  . Smokeless tobacco: Never Used  Substance and Sexual Activity  . Alcohol use: No    Alcohol/week: 0.0 standard drinks  . Drug use: No  . Sexual activity: Yes    Partners: Female    Birth control/protection: Condom  Lifestyle  . Physical activity    Days per week: Not on file    Minutes per session: Not on file  . Stress: Not on file  Relationships  . Social Musician on phone: Not on file    Gets together: Not on file    Attends religious service: Not on file    Active member of club or organization: Not on file    Attends meetings of clubs or organizations: Not on file    Relationship status: Not on file  . Intimate partner violence    Fear of current or ex partner: Not on file    Emotionally abused: Not on file    Physically abused: Not on file    Forced sexual activity: Not on file  Other Topics Concern  . Not on file  Social History Narrative   Lives with his wife and their 2 children.   He has 3 sons from a previous relationship that live independently.   Formerly in the [employer].   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    Family History  Problem Relation Age of Onset  . Diabetes Mother   . Stroke Mother        4 strokes  . Heart attack Mother   . Heart disease Mother   . Hypertension Father   . Hypertension Sister   . Heart disease Sister        pacemaker     Review of Systems  Constitutional: Negative.   Negative for chills and fever.  HENT: Negative for congestion and sore throat.   Respiratory: Negative.  Negative for cough and shortness of breath.   Cardiovascular: Negative.  Negative for chest pain and palpitations.  Gastrointestinal: Negative.  Negative for abdominal pain, diarrhea, nausea and vomiting.  Genitourinary: Negative.  Negative for dysuria.  Musculoskeletal: Negative for myalgias.  Skin: Negative.  Negative for rash.  Neurological: Negative for dizziness and headaches.  All other systems reviewed and  are negative.   Today's Vitals   09/27/19 0816  BP: 138/85  Pulse: 74  Resp: 16  Temp: 98.5 F (36.9 C)  TempSrc: Oral  SpO2: 99%  Weight: 259 lb 3.2 oz (117.6 kg)  Height:  (1.702 m)   Body mass index is 40.6 kg/m.  Physical Exam Vitals signs reviewed.  Constitutional:      Appearance: He is obese.  HENT:     Head: Normocephalic.  Eyes:     Extraocular Movements: Extraocular movements intact.     Conjunctiva/sclera: Conjunctivae normal.     Pupils: Pupils are equal, round, and reactive to light.  Neck:     Musculoskeletal: Normal range of motion and neck supple.  Cardiovascular:     Rate and Rhythm: Normal rate and regular rhythm.     Heart sounds: Normal heart sounds.  Pulmonary:     Effort: Pulmonary effort is normal.     Breath sounds: Normal breath sounds.  Abdominal:     Palpations: Abdomen is soft.     Tenderness: There is no abdominal tenderness.  Musculoskeletal: Normal range of motion.  Skin:    General: Skin is warm and dry.     Capillary Refill: Capillary refill takes less than 2 seconds.  Neurological:     General: No focal deficit present.     Mental Status: He is alert and oriented to person, place, and time.  Psychiatric:        Mood and Affect: Mood normal.        Behavior: Behavior normal.    Results for orders placed or performed in visit on 09/27/19 (from the past 24 hour(s))  POCT glucose (manual entry)     Status:  Abnormal   Collection Time: 09/27/19  8:41 AM  Result Value Ref Range   POC Glucose 262 (A) 70 - 99 mg/dl  POCT glycosylated hemoglobin (Hb A1C)     Status: Abnormal   Collection Time: 09/27/19  8:43 AM  Result Value Ref Range   Hemoglobin A1C 8.3 (A) 4.0 - 5.6 %   HbA1c POC (<> result, manual entry)     HbA1c, POC (prediabetic range)     HbA1c, POC (controlled diabetic range)     A total of 40 minutes was spent in the room with the patient, greater than 50% of which was in counseling/coordination of care regarding chronic medical conditions including diabetes, hypertension and dyslipidemia, cardiovascular risks associated with these conditions, diet and nutrition, review of blood results, change of medications and new medication, hypoglycemia precautions, need to increase physical activity and lose weight, prognosis, and need for follow-up.   ASSESSMENT & PLAN: Hypertension associated with diabetes (HCC) Well controlled blood pressure.  Continue present medication.  No changes. Uncontrolled diabetes with hemoglobin A1c at 8.3 higher than before.  Increase metformin to 1000 mg twice a day and add SGLT inhibitor as per formulary.  Advised on nutrition and physical activity.  Follow-up in 3 months.  Mauri was seen today for medication refill.  Diagnoses and all orders for this visit:  Hypertension associated with diabetes (HCC) -     CBC with Differential/Platelet -     Comprehensive metabolic panel -     POCT glucose (manual entry) -     POCT glycosylated hemoglobin (Hb A1C) -     lisinopril (ZESTRIL) 20 MG tablet; Take 1 tablet (20 mg total) by mouth daily. -     HM Diabetes Foot Exam -     dapagliflozin  propanediol (FARXIGA) 5 MG TABS tablet; Take 5 mg by mouth daily before breakfast. -     metFORMIN (GLUCOPHAGE) 1000 MG tablet; Take 1 tablet (1,000 mg total) by mouth 2 (two) times daily with a meal.  Dyslipidemia associated with type 2 diabetes mellitus (HCC) -     Lipid panel   OSA (obstructive sleep apnea)  Class 3 severe obesity with serious comorbidity and body mass index (BMI) of 40.0 to 44.9 in adult, unspecified obesity type (HCC)  Need for prophylactic vaccination and inoculation against influenza -     Flu Vaccine QUAD 36+ mos IM  Other orders -     atorvastatin (LIPITOR) 20 MG tablet; Take 1 tablet (20 mg total) by mouth daily.    Patient Instructions       If you have lab work done today you will be contacted with your lab results within the next 2 weeks.  If you have not heard from [place] then please contact us. The fastest way to get your results is to register for My Chart.   IF you received an x-ray today, you will receive an invoice from [facility]. Please contact [facility] at [phone] with questions or concerns regarding your invoice.   IF you received labwork today, you will receive an invoice from [place]. Please contact [facility] at [phone] with questions or concerns regarding your invoice.   Our billing staff will not be able to assist you with questions regarding bills from these companies.  You will be contacted with the lab results as soon as they are available. The fastest way to get your results is to activate your My Chart account. Instructions are located on the last page of this paperwork. If you have not heard from [place] regarding the results in 2 weeks, please contact this office.     Diabetes Mellitus and Nutrition, Adult When you have diabetes (diabetes mellitus), it is very important to have healthy eating habits because your blood sugar (glucose) levels are greatly affected by what you eat and drink. Eating healthy foods in the appropriate amounts, at about the same times every day, can help you:  Control your blood glucose.  Lower your risk of heart disease.  Improve your blood pressure.  Reach or maintain a healthy weight. Every person with diabetes is different, and each person has  different needs for a meal plan. Your health care Walter Reynolds may recommend that you work with a diet and nutrition specialist (dietitian) to make a meal plan that is best for you. Your meal plan may vary depending on factors such as:  The calories you need.  The medicines you take.  Your weight.  Your blood glucose, blood pressure, and cholesterol levels.  Your activity level.  Other health conditions you have, such as heart or kidney disease. How do carbohydrates affect me? Carbohydrates, also called carbs, affect your blood glucose level more than any other type of food. Eating carbs naturally raises the amount of glucose in your blood. Carb counting is a method for keeping track of how many carbs you eat. Counting carbs is important to keep your blood glucose at a healthy level, especially if you use insulin or take certain oral diabetes medicines. It is important to know how many carbs you can safely have in each meal. This is different for every person. Your dietitian can help you calculate how many carbs you should have at each meal and for each snack. Foods that contain carbs include:  Bread,  cereal, rice, pasta, and crackers.  Potatoes and corn.  Peas, beans, and lentils.  Milk and yogurt.  Fruit and juice.  Desserts, such as cakes, cookies, ice cream, and candy. How does alcohol affect me? Alcohol can cause a sudden decrease in blood glucose (hypoglycemia), especially if you use insulin or take certain oral diabetes medicines. Hypoglycemia can be a life-threatening condition. Symptoms of hypoglycemia (sleepiness, dizziness, and confusion) are similar to symptoms of having too much alcohol. If your health care Walter Reynolds says that alcohol is safe for you, follow these guidelines:  Limit alcohol intake to no more than 1 drink per day for nonpregnant women and 2 drinks per day for men. One drink equals 12 oz of beer, 5 oz of wine, or 1 oz of hard liquor.  Do not drink on an  empty stomach.  Keep yourself hydrated with water, diet soda, or unsweetened iced tea.  Keep in mind that regular soda, juice, and other mixers may contain a lot of sugar and must be counted as carbs. What are tips for following this plan?  Reading food labels  Start by checking the serving size on the "Nutrition Facts" label of packaged foods and drinks. The amount of calories, carbs, fats, and other nutrients listed on the label is based on one serving of the item. Many items contain more than one serving per package.  Check the total grams (g) of carbs in one serving. You can calculate the number of servings of carbs in one serving by dividing the total carbs by 15. For example, if a food has 30 g of total carbs, it would be equal to 2 servings of carbs.  Check the number of grams (g) of saturated and trans fats in one serving. Choose foods that have low or no amount of these fats.  Check the number of milligrams (mg) of salt (sodium) in one serving. Most people should limit total sodium intake to less than 2,300 mg per day.  Always check the nutrition information of foods labeled as "low-fat" or "nonfat". These foods may be higher in added sugar or refined carbs and should be avoided.  Talk to your dietitian to identify your daily goals for nutrients listed on the label. Shopping  Avoid buying canned, premade, or processed foods. These foods tend to be high in fat, sodium, and added sugar.  Shop around the outside edge of the grocery store. This includes fresh fruits and vegetables, bulk grains, fresh meats, and fresh dairy. Cooking  Use low-heat cooking methods, such as baking, instead of high-heat cooking methods like deep frying.  Cook using healthy oils, such as olive, canola, or sunflower oil.  Avoid cooking with butter, cream, or high-fat meats. Meal planning  Eat meals and snacks regularly, preferably at the same times every day. Avoid going long periods of time without  eating.  Eat foods high in fiber, such as fresh fruits, vegetables, beans, and whole grains. Talk to your dietitian about how many servings of carbs you can eat at each meal.  Eat 4-6 ounces (oz) of lean protein each day, such as lean meat, chicken, fish, eggs, or tofu. One oz of lean protein is equal to: ? 1 oz of meat, chicken, or fish. ? 1 egg. ?  cup of tofu.  Eat some foods each day that contain healthy fats, such as avocado, nuts, seeds, and fish. Lifestyle  Check your blood glucose regularly.  Exercise regularly as told by your health care Walter Reynolds. This may include: ?  150 minutes of moderate-intensity or vigorous-intensity exercise each week. This could be brisk walking, biking, or water aerobics. ? Stretching and doing strength exercises, such as yoga or weightlifting, at least 2 times a week.  Take medicines as told by your health care Walter Reynolds.  Do not use any products that contain nicotine or tobacco, such as cigarettes and e-cigarettes. If you need help quitting, ask your health care Walter Reynolds.  Work with a Social worker or diabetes educator to identify strategies to manage stress and any emotional and social challenges. Questions to ask a health care provider  Do I need to meet with a diabetes educator?  Do I need to meet with a dietitian?  What number can I call if I have questions?  When are the best times to check my blood glucose? Where to find more information:  American Diabetes Association: diabetes.org  Academy of Nutrition and Dietetics: www.eatright.[employer] of Diabetes and Digestive and Kidney Diseases (NIH): [employer] Summary  A healthy meal plan will help you control your blood glucose and maintain a healthy lifestyle.  Working with a diet and nutrition specialist (dietitian) can help you make a meal plan that is best for you.  Keep in mind that carbohydrates (carbs) and alcohol have immediate effects on your blood glucose  levels. It is important to count carbs and to use alcohol carefully. This information is not intended to replace advice given to you by your health care Walter Reynolds. Make sure you discuss any questions you have with your health care Walter Reynolds. Document Released: 08/29/2005 Document Revised: 11/14/2017 Document Reviewed: 01/06/2017 Elsevier Patient Education  2020 Elsevier Inc.      Agustina Caroli, MD [facility]

## 2019-09-27 NOTE — Patient Instructions (Addendum)
   If you have lab work done today you will be contacted with your lab results within the next 2 weeks.  If you have not heard from us then please contact us. The fastest way to get your results is to register for My Chart.   IF you received an x-ray today, you will receive an invoice from [facility]. Please contact [facility] at [phone] with questions or concerns regarding your invoice.   IF you received labwork today, you will receive an invoice from [facility]. Please contact [facility] at [phone] with questions or concerns regarding your invoice.   Our billing staff will not be able to assist you with questions regarding bills from these companies.  You will be contacted with the lab results as soon as they are available. The fastest way to get your results is to activate your My Chart account. Instructions are located on the last page of this paperwork. If you have not heard from us regarding the results in 2 weeks, please contact this office.     Diabetes Mellitus and Nutrition, Adult When you have diabetes (diabetes mellitus), it is very important to have healthy eating habits because your blood sugar (glucose) levels are greatly affected by what you eat and drink. Eating healthy foods in the appropriate amounts, at about the same times every day, can help you:  Control your blood glucose.  Lower your risk of heart disease.  Improve your blood pressure.  Reach or maintain a healthy weight. Every person with diabetes is different, and each person has different needs for a meal plan. Your health care provider may recommend that you work with a diet and nutrition specialist (dietitian) to make a meal plan that is best for you. Your meal plan may vary depending on factors such as:  The calories you need.  The medicines you take.  Your weight.  Your blood glucose, blood pressure, and cholesterol levels.  Your activity level.  Other health conditions  you have, such as heart or kidney disease. How do carbohydrates affect me? Carbohydrates, also called carbs, affect your blood glucose level more than any other type of food. Eating carbs naturally raises the amount of glucose in your blood. Carb counting is a method for keeping track of how many carbs you eat. Counting carbs is important to keep your blood glucose at a healthy level, especially if you use insulin or take certain oral diabetes medicines. It is important to know how many carbs you can safely have in each meal. This is different for every person. Your dietitian can help you calculate how many carbs you should have at each meal and for each snack. Foods that contain carbs include:  Bread, cereal, rice, pasta, and crackers.  Potatoes and corn.  Peas, beans, and lentils.  Milk and yogurt.  Fruit and juice.  Desserts, such as cakes, cookies, ice cream, and candy. How does alcohol affect me? Alcohol can cause a sudden decrease in blood glucose (hypoglycemia), especially if you use insulin or take certain oral diabetes medicines. Hypoglycemia can be a life-threatening condition. Symptoms of hypoglycemia (sleepiness, dizziness, and confusion) are similar to symptoms of having too much alcohol. If your health care provider says that alcohol is safe for you, follow these guidelines:  Limit alcohol intake to no more than 1 drink per day for nonpregnant women and 2 drinks per day for men. One drink equals 12 oz of beer, 5 oz of wine, or 1 oz of hard liquor.    Do not drink on an empty stomach.  Keep yourself hydrated with water, diet soda, or unsweetened iced tea.  Keep in mind that regular soda, juice, and other mixers may contain a lot of sugar and must be counted as carbs. What are tips for following this plan?  Reading food labels  Start by checking the serving size on the "Nutrition Facts" label of packaged foods and drinks. The amount of calories, carbs, fats, and other  nutrients listed on the label is based on one serving of the item. Many items contain more than one serving per package.  Check the total grams (g) of carbs in one serving. You can calculate the number of servings of carbs in one serving by dividing the total carbs by 15. For example, if a food has 30 g of total carbs, it would be equal to 2 servings of carbs.  Check the number of grams (g) of saturated and trans fats in one serving. Choose foods that have low or no amount of these fats.  Check the number of milligrams (mg) of salt (sodium) in one serving. Most people should limit total sodium intake to less than 2,300 mg per day.  Always check the nutrition information of foods labeled as "low-fat" or "nonfat". These foods may be higher in added sugar or refined carbs and should be avoided.  Talk to your dietitian to identify your daily goals for nutrients listed on the label. Shopping  Avoid buying canned, premade, or processed foods. These foods tend to be high in fat, sodium, and added sugar.  Shop around the outside edge of the grocery store. This includes fresh fruits and vegetables, bulk grains, fresh meats, and fresh dairy. Cooking  Use low-heat cooking methods, such as baking, instead of high-heat cooking methods like deep frying.  Cook using healthy oils, such as olive, canola, or sunflower oil.  Avoid cooking with butter, cream, or high-fat meats. Meal planning  Eat meals and snacks regularly, preferably at the same times every day. Avoid going long periods of time without eating.  Eat foods high in fiber, such as fresh fruits, vegetables, beans, and whole grains. Talk to your dietitian about how many servings of carbs you can eat at each meal.  Eat 4-6 ounces (oz) of lean protein each day, such as lean meat, chicken, fish, eggs, or tofu. One oz of lean protein is equal to: ? 1 oz of meat, chicken, or fish. ? 1 egg. ?  cup of tofu.  Eat some foods each day that contain  healthy fats, such as avocado, nuts, seeds, and fish. Lifestyle  Check your blood glucose regularly.  Exercise regularly as told by your health care provider. This may include: ? 150 minutes of moderate-intensity or vigorous-intensity exercise each week. This could be brisk walking, biking, or water aerobics. ? Stretching and doing strength exercises, such as yoga or weightlifting, at least 2 times a week.  Take medicines as told by your health care provider.  Do not use any products that contain nicotine or tobacco, such as cigarettes and e-cigarettes. If you need help quitting, ask your health care provider.  Work with a counselor or diabetes educator to identify strategies to manage stress and any emotional and social challenges. Questions to ask a health care provider  Do I need to meet with a diabetes educator?  Do I need to meet with a dietitian?  What number can I call if I have questions?  When are the best times to   check my blood glucose? Where to find more information:  American Diabetes Association: diabetes.org  Academy of Nutrition and Dietetics: www.eatright.org  National Institute of Diabetes and Digestive and Kidney Diseases (NIH): www.niddk.nih.gov Summary  A healthy meal plan will help you control your blood glucose and maintain a healthy lifestyle.  Working with a diet and nutrition specialist (dietitian) can help you make a meal plan that is best for you.  Keep in mind that carbohydrates (carbs) and alcohol have immediate effects on your blood glucose levels. It is important to count carbs and to use alcohol carefully. This information is not intended to replace advice given to you by your health care provider. Make sure you discuss any questions you have with your health care provider. Document Released: 08/29/2005 Document Revised: 11/14/2017 Document Reviewed: 01/06/2017 Elsevier Patient Education  2020 [employer].  

## 2019-09-28 ENCOUNTER — Encounter: Payer: Self-pay | Admitting: Emergency Medicine

## 2019-09-28 LAB — COMPREHENSIVE METABOLIC PANEL
ALT: 29 IU/L (ref 0–44)
AST: 16 IU/L (ref 0–40)
Albumin/Globulin Ratio: 1.6 (ref 1.2–2.2)
Albumin: 4.6 g/dL (ref 4.0–5.0)
Alkaline Phosphatase: 121 IU/L — ABNORMAL HIGH (ref 39–117)
BUN/Creatinine Ratio: 13 (ref 9–20)
BUN: 12 mg/dL (ref 6–24)
Bilirubin Total: 0.3 mg/dL (ref 0.0–1.2)
CO2: 20 mmol/L (ref 20–29)
Calcium: 9.6 mg/dL (ref 8.7–10.2)
Chloride: 100 mmol/L (ref 96–106)
Creatinine, Ser: 0.95 mg/dL (ref 0.76–1.27)
GFR calc Af Amer: 108 mL/min/{1.73_m2} (ref >59)
GFR calc non Af Amer: 94 mL/min/{1.73_m2} (ref >59)
Globulin, Total: 2.8 g/dL (ref 1.5–4.5)
Glucose: 260 mg/dL — ABNORMAL HIGH (ref 65–99)
Potassium: 4.5 mmol/L (ref 3.5–5.2)
Sodium: 136 mmol/L (ref 134–144)
Total Protein: 7.4 g/dL (ref 6.0–8.5)

## 2019-09-28 LAB — CBC WITH DIFFERENTIAL/PLATELET
Basophils Absolute: 0 10*3/uL (ref 0.0–0.2)
Basos: 1 %
EOS (ABSOLUTE): 0.1 10*3/uL (ref 0.0–0.4)
Eos: 2 %
Hematocrit: 46.1 % (ref 37.5–51.0)
Hemoglobin: 14.2 g/dL (ref 13.0–17.7)
Immature Grans (Abs): 0 10*3/uL (ref 0.0–0.1)
Immature Granulocytes: 0 %
Lymphocytes Absolute: 3.2 10*3/uL — ABNORMAL HIGH (ref 0.7–3.1)
Lymphs: 39 %
MCH: 23.3 pg — ABNORMAL LOW (ref 26.6–33.0)
MCHC: 30.8 g/dL — ABNORMAL LOW (ref 31.5–35.7)
MCV: 76 fL — ABNORMAL LOW (ref 79–97)
Monocytes Absolute: 0.7 10*3/uL (ref 0.1–0.9)
Monocytes: 9 %
Neutrophils Absolute: 4 10*3/uL (ref 1.4–7.0)
Neutrophils: 49 %
Platelets: 293 10*3/uL (ref 150–450)
RBC: 6.09 x10E6/uL — ABNORMAL HIGH (ref 4.14–5.80)
RDW: 15.9 % — ABNORMAL HIGH (ref 11.6–15.4)
WBC: 8 10*3/uL (ref 3.4–10.8)

## 2019-09-28 LAB — LIPID PANEL
Chol/HDL Ratio: 5.5 ratio — ABNORMAL HIGH (ref 0.0–5.0)
Cholesterol, Total: 164 mg/dL (ref 100–199)
HDL: 30 mg/dL — ABNORMAL LOW (ref >39)
LDL Chol Calc (NIH): 94 mg/dL (ref 0–99)
Triglycerides: 237 mg/dL — ABNORMAL HIGH (ref 0–149)
VLDL Cholesterol Cal: 40 mg/dL (ref 5–40)

## 2019-09-29 DIAGNOSIS — G5602 Carpal tunnel syndrome, left upper limb: Secondary | ICD-10-CM | POA: Diagnosis not present

## 2019-09-29 DIAGNOSIS — M65332 Trigger finger, left middle finger: Secondary | ICD-10-CM | POA: Diagnosis not present

## 2019-10-04 DIAGNOSIS — G5603 Carpal tunnel syndrome, bilateral upper limbs: Secondary | ICD-10-CM | POA: Diagnosis not present

## 2019-10-05 ENCOUNTER — Encounter: Payer: Self-pay | Admitting: Emergency Medicine

## 2019-10-06 ENCOUNTER — Other Ambulatory Visit: Payer: Self-pay | Admitting: Emergency Medicine

## 2019-10-06 NOTE — Telephone Encounter (Signed)
Call his pharmacy and find out what the choices are according to his medical insurance.  Thanks.

## 2019-11-19 DIAGNOSIS — G5603 Carpal tunnel syndrome, bilateral upper limbs: Secondary | ICD-10-CM | POA: Diagnosis not present

## 2019-11-25 DIAGNOSIS — M79642 Pain in left hand: Secondary | ICD-10-CM | POA: Diagnosis not present

## 2019-11-25 DIAGNOSIS — M25532 Pain in left wrist: Secondary | ICD-10-CM | POA: Diagnosis not present

## 2019-11-25 DIAGNOSIS — G5603 Carpal tunnel syndrome, bilateral upper limbs: Secondary | ICD-10-CM | POA: Diagnosis not present

## 2019-11-25 DIAGNOSIS — M25642 Stiffness of left hand, not elsewhere classified: Secondary | ICD-10-CM | POA: Diagnosis not present

## 2019-12-13 DIAGNOSIS — G5603 Carpal tunnel syndrome, bilateral upper limbs: Secondary | ICD-10-CM | POA: Diagnosis not present

## 2019-12-13 DIAGNOSIS — M25532 Pain in left wrist: Secondary | ICD-10-CM | POA: Diagnosis not present

## 2019-12-13 DIAGNOSIS — M79642 Pain in left hand: Secondary | ICD-10-CM | POA: Diagnosis not present

## 2019-12-13 DIAGNOSIS — M25642 Stiffness of left hand, not elsewhere classified: Secondary | ICD-10-CM | POA: Diagnosis not present

## 2019-12-23 DIAGNOSIS — M25512 Pain in left shoulder: Secondary | ICD-10-CM | POA: Diagnosis not present

## 2019-12-27 ENCOUNTER — Ambulatory Visit: Payer: BC Managed Care – PPO | Admitting: Emergency Medicine

## 2019-12-29 ENCOUNTER — Ambulatory Visit: Payer: BC Managed Care – PPO | Admitting: Emergency Medicine

## 2019-12-29 ENCOUNTER — Other Ambulatory Visit: Payer: Self-pay

## 2019-12-29 ENCOUNTER — Encounter: Payer: Self-pay | Admitting: Emergency Medicine

## 2019-12-29 VITALS — BP 127/82 | HR 84 | Temp 98.1°F | Resp 16 | Ht 67.0 in | Wt 260.0 lb

## 2019-12-29 DIAGNOSIS — M25642 Stiffness of left hand, not elsewhere classified: Secondary | ICD-10-CM | POA: Diagnosis not present

## 2019-12-29 DIAGNOSIS — Z6841 Body Mass Index (BMI) 40.0 and over, adult: Secondary | ICD-10-CM | POA: Diagnosis not present

## 2019-12-29 DIAGNOSIS — M65332 Trigger finger, left middle finger: Secondary | ICD-10-CM | POA: Diagnosis not present

## 2019-12-29 DIAGNOSIS — E1159 Type 2 diabetes mellitus with other circulatory complications: Secondary | ICD-10-CM

## 2019-12-29 DIAGNOSIS — I10 Essential (primary) hypertension: Secondary | ICD-10-CM | POA: Diagnosis not present

## 2019-12-29 DIAGNOSIS — E1169 Type 2 diabetes mellitus with other specified complication: Secondary | ICD-10-CM

## 2019-12-29 DIAGNOSIS — E785 Hyperlipidemia, unspecified: Secondary | ICD-10-CM | POA: Diagnosis not present

## 2019-12-29 DIAGNOSIS — G5603 Carpal tunnel syndrome, bilateral upper limbs: Secondary | ICD-10-CM | POA: Diagnosis not present

## 2019-12-29 LAB — POCT GLYCOSYLATED HEMOGLOBIN (HGB A1C): Hemoglobin A1C: 9.1 % — AB (ref 4.0–5.6)

## 2019-12-29 LAB — GLUCOSE, POCT (MANUAL RESULT ENTRY): POC Glucose: 219 mg/dl — AB (ref 70–99)

## 2019-12-29 MED ORDER — TRULICITY 0.75 MG/0.5ML ~~LOC~~ SOAJ: 0.7500 mg | SUBCUTANEOUS | 5 refills | Status: DC

## 2019-12-29 NOTE — Patient Instructions (Addendum)
   If you have lab work done today you will be contacted with your lab results within the next 2 weeks.  If you have not heard from us then please contact us. The fastest way to get your results is to register for My Chart.   IF you received an x-ray today, you will receive an invoice from [facility]. Please contact [facility] at [phone] with questions or concerns regarding your invoice.   IF you received labwork today, you will receive an invoice from [facility]. Please contact [facility] at [phone] with questions or concerns regarding your invoice.   Our billing staff will not be able to assist you with questions regarding bills from these companies.  You will be contacted with the lab results as soon as they are available. The fastest way to get your results is to activate your My Chart account. Instructions are located on the last page of this paperwork. If you have not heard from us regarding the results in 2 weeks, please contact this office.      Diabetes Mellitus and Nutrition, Adult When you have diabetes (diabetes mellitus), it is very important to have healthy eating habits because your blood sugar (glucose) levels are greatly affected by what you eat and drink. Eating healthy foods in the appropriate amounts, at about the same times every day, can help you:  Control your blood glucose.  Lower your risk of heart disease.  Improve your blood pressure.  Reach or maintain a healthy weight. Every person with diabetes is different, and each person has different needs for a meal plan. Your health care provider may recommend that you work with a diet and nutrition specialist (dietitian) to make a meal plan that is best for you. Your meal plan may vary depending on factors such as:  The calories you need.  The medicines you take.  Your weight.  Your blood glucose, blood pressure, and cholesterol levels.  Your activity level.  Other health  conditions you have, such as heart or kidney disease. How do carbohydrates affect me? Carbohydrates, also called carbs, affect your blood glucose level more than any other type of food. Eating carbs naturally raises the amount of glucose in your blood. Carb counting is a method for keeping track of how many carbs you eat. Counting carbs is important to keep your blood glucose at a healthy level, especially if you use insulin or take certain oral diabetes medicines. It is important to know how many carbs you can safely have in each meal. This is different for every person. Your dietitian can help you calculate how many carbs you should have at each meal and for each snack. Foods that contain carbs include:  Bread, cereal, rice, pasta, and crackers.  Potatoes and corn.  Peas, beans, and lentils.  Milk and yogurt.  Fruit and juice.  Desserts, such as cakes, cookies, ice cream, and candy. How does alcohol affect me? Alcohol can cause a sudden decrease in blood glucose (hypoglycemia), especially if you use insulin or take certain oral diabetes medicines. Hypoglycemia can be a life-threatening condition. Symptoms of hypoglycemia (sleepiness, dizziness, and confusion) are similar to symptoms of having too much alcohol. If your health care provider says that alcohol is safe for you, follow these guidelines:  Limit alcohol intake to no more than 1 drink per day for nonpregnant women and 2 drinks per day for men. One drink equals 12 oz of beer, 5 oz of wine, or 1 oz of hard   liquor.  Do not drink on an empty stomach.  Keep yourself hydrated with water, diet soda, or unsweetened iced tea.  Keep in mind that regular soda, juice, and other mixers may contain a lot of sugar and must be counted as carbs. What are tips for following this plan?  Reading food labels  Start by checking the serving size on the "Nutrition Facts" label of packaged foods and drinks. The amount of calories, carbs, fats, and  other nutrients listed on the label is based on one serving of the item. Many items contain more than one serving per package.  Check the total grams (g) of carbs in one serving. You can calculate the number of servings of carbs in one serving by dividing the total carbs by 15. For example, if a food has 30 g of total carbs, it would be equal to 2 servings of carbs.  Check the number of grams (g) of saturated and trans fats in one serving. Choose foods that have low or no amount of these fats.  Check the number of milligrams (mg) of salt (sodium) in one serving. Most people should limit total sodium intake to less than 2,300 mg per day.  Always check the nutrition information of foods labeled as "low-fat" or "nonfat". These foods may be higher in added sugar or refined carbs and should be avoided.  Talk to your dietitian to identify your daily goals for nutrients listed on the label. Shopping  Avoid buying canned, premade, or processed foods. These foods tend to be high in fat, sodium, and added sugar.  Shop around the outside edge of the grocery store. This includes fresh fruits and vegetables, bulk grains, fresh meats, and fresh dairy. Cooking  Use low-heat cooking methods, such as baking, instead of high-heat cooking methods like deep frying.  Cook using healthy oils, such as olive, canola, or sunflower oil.  Avoid cooking with butter, cream, or high-fat meats. Meal planning  Eat meals and snacks regularly, preferably at the same times every day. Avoid going long periods of time without eating.  Eat foods high in fiber, such as fresh fruits, vegetables, beans, and whole grains. Talk to your dietitian about how many servings of carbs you can eat at each meal.  Eat 4-6 ounces (oz) of lean protein each day, such as lean meat, chicken, fish, eggs, or tofu. One oz of lean protein is equal to: ? 1 oz of meat, chicken, or fish. ? 1 egg. ?  cup of tofu.  Eat some foods each day that  contain healthy fats, such as avocado, nuts, seeds, and fish. Lifestyle  Check your blood glucose regularly.  Exercise regularly as told by your health care provider. This may include: ? 150 minutes of moderate-intensity or vigorous-intensity exercise each week. This could be brisk walking, biking, or water aerobics. ? Stretching and doing strength exercises, such as yoga or weightlifting, at least 2 times a week.  Take medicines as told by your health care provider.  Do not use any products that contain nicotine or tobacco, such as cigarettes and e-cigarettes. If you need help quitting, ask your health care provider.  Work with a counselor or diabetes educator to identify strategies to manage stress and any emotional and social challenges. Questions to ask a health care provider  Do I need to meet with a diabetes educator?  Do I need to meet with a dietitian?  What number can I call if I have questions?  When are the best   times to check my blood glucose? Where to find more information:  American Diabetes Association: diabetes.org  Academy of Nutrition and Dietetics: www.eatright.org  National Institute of Diabetes and Digestive and Kidney Diseases (NIH): www.niddk.nih.gov Summary  A healthy meal plan will help you control your blood glucose and maintain a healthy lifestyle.  Working with a diet and nutrition specialist (dietitian) can help you make a meal plan that is best for you.  Keep in mind that carbohydrates (carbs) and alcohol have immediate effects on your blood glucose levels. It is important to count carbs and to use alcohol carefully. This information is not intended to replace advice given to you by your health care provider. Make sure you discuss any questions you have with your health care provider. Document Revised: 11/14/2017 Document Reviewed: 01/06/2017 Elsevier Patient Education  2020 [employer] Inc.  

## 2019-12-29 NOTE — Assessment & Plan Note (Signed)
Well-controlled hypertension.  Continue present medication. Uncontrolled diabetes with hemoglobin A1c at 9.1, higher than before.  Continue Metformin 1000 mg twice a day and add Trulicity 0.75 mg weekly.  Refer to nutrition and diabetic services.  Follow-up in 3 months.

## 2019-12-29 NOTE — Progress Notes (Signed)
Walter Reynolds. 49 y.o.   Chief Complaint  Patient presents with  . Diabetes    follow up 3 month     HISTORY OF PRESENT ILLNESS: This is a 49 y.o. male with history of diabetes here for 68-month recheck.  Presently on Metformin 1000 mg twice a day.  Did not start Wilder Glade due to high cost. On atorvastatin 20 mg daily and lisinopril 20 mg daily. Blood sugars at home have been in the 200s. HPI   Prior to Admission medications   Medication Sig Start Date End Date Taking? Authorizing Provider  atorvastatin (LIPITOR) 20 MG tablet Take 1 tablet (20 mg total) by mouth daily. 09/27/19  Yes Horald Pollen, MD  metFORMIN (GLUCOPHAGE) 1000 MG tablet Take 1 tablet (1,000 mg total) by mouth 2 (two) times daily with a meal. 09/27/19  Yes Appollonia Klee, Ines Bloomer, MD  dapagliflozin propanediol (FARXIGA) 5 MG TABS tablet Take 5 mg by mouth daily before breakfast. Patient not taking: Reported on 12/29/2019 09/27/19   Horald Pollen, MD  diclofenac (VOLTAREN) 75 MG EC tablet TAKE 1 TABLET BY MOUTH TWICE A DAY Patient not taking: Reported on 12/29/2019 05/24/19   Wallene Huh, DPM  diclofenac (VOLTAREN) 75 MG EC tablet Take 1 tablet (75 mg total) by mouth 2 (two) times daily. Patient not taking: Reported on 12/29/2019 05/24/19   Wallene Huh, DPM  fluticasone [facility]) 50 MCG/ACT nasal spray SPRAY 2 SPRAYS INTO EACH NOSTRIL EVERY DAY Patient not taking: Reported on 12/29/2019 09/14/19   Horald Pollen, MD  lisinopril (ZESTRIL) 20 MG tablet Take 1 tablet (20 mg total) by mouth daily. 09/27/19 12/26/19  Horald Pollen, MD  meloxicam (MOBIC) 15 MG tablet Take 15 mg by mouth daily as needed. 01/15/18   [provider]  neomycin-polymyxin-hydrocortisone (CORTISPORIN) OTIC solution Apply 1-2 drops to toe after soaking twice a day Patient not taking: Reported on 12/29/2019 02/15/19   Wallene Huh, DPM    Allergies  Allergen Reactions  . Doxycycline Hives  . Erythromycin  Base Hives    Patient Active Problem List   Diagnosis Date Noted  . Hypertension associated with diabetes ([place]) 09/27/2019  . Class 3 severe obesity with serious comorbidity and body mass index (BMI) of 40.0 to 44.9 in adult ([place]) 09/27/2019  . Dyslipidemia 09/09/2018  . Environmental allergies 09/09/2018  . BMI 39.0-39.9,adult 09/03/2017  . Dyslipidemia associated with type 2 diabetes mellitus ([place]) 09/03/2017  . Anemia 04/14/2017  . Hyperlipidemia 05/02/2016  . OSA (obstructive sleep apnea) 09/06/2015  . Benign essential HTN 09/06/2015    Past Medical History:  Diagnosis Date  . Allergy   . Diabetes mellitus without complication ([place])   . Elevated cholesterol   . Hernia    bi lateral hernia repair  . History of kidney stones   . Hypertension   . Sleep apnea 2014   severe  . Umbilical hernia without obstruction and without gangrene 09/03/2017    Past Surgical History:  Procedure Laterality Date  . CARPAL TUNNEL RELEASE Right 2007  . CARPAL TUNNEL RELEASE Left 10/28/2014   Procedure: LEFT CARPAL TUNNEL RELEASE;  Surgeon: Charlotte Crumb, MD;  Location: [facility];  Service: Orthopedics;  Laterality: Left;  . FOOT SURGERY Left   . INGUINAL HERNIA REPAIR     times two, each side  . KIDNEY STONE SURGERY    . MASS EXCISION Left 10/28/2014   Procedure: LEFT WRIST VOLAR MASS EXCISION;  Surgeon: Charlotte Crumb, MD;  Location: [facility];  Service: Orthopedics;  Laterality: Left;  . UMBILICAL HERNIA REPAIR N/A 10/27/2017   Procedure: HERNIA REPAIR UMBILICAL ADULT;  Surgeon: Berna Bueonnor, Chelsea A, MD;  [facility];  Service: General;  Laterality: N/A;    Social History   Socioeconomic History  . Marital status: Married    Spouse name: Anne NgLatoya C Deloney  . Number of children: 2  . Years of education: Associates  . Highest education level: Not on file  Occupational History  . Occupation: Merchandiser, retailupervisor    Comment: [employer]  Tobacco Use  .  Smoking status: Never Smoker  . Smokeless tobacco: Never Used  Substance and Sexual Activity  . Alcohol use: No    Alcohol/week: 0.0 standard drinks  . Drug use: No  . Sexual activity: Yes    Partners: Female    Birth control/protection: Condom  Other Topics Concern  . Not on file  Social History Narrative   Lives with his wife and their 2 children.   He has 3 sons from a previous relationship that live independently.   Formerly in the [employer].   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   Social Determinants of Health   Financial Resource Strain:   . Difficulty of Paying Living Expenses: Not on file  Food Insecurity:   . Worried About Programme researcher, broadcasting/film/videounning Out of Food in the Last Year: Not on file  . Ran Out of Food in the Last Year: Not on file  Transportation Needs:   . Lack of Transportation (Medical): Not on file  . Lack of Transportation (Non-Medical): Not on file  Physical Activity:   . Days of Exercise per Week: Not on file  . Minutes of Exercise per Session: Not on file  Stress:   . Feeling of Stress : Not on file  Social Connections:   . Frequency of Communication with Friends and Family: Not on file  . Frequency of Social Gatherings with Friends and Family: Not on file  . Attends Religious Services: Not on file  . Active Member of Clubs or Organizations: Not on file  . Attends BankerClub or Organization Meetings: Not on file  . Marital Status: Not on file  Intimate Partner Violence:   . Fear of Current or Ex-Partner: Not on file  . Emotionally Abused: Not on file  . Physically Abused: Not on file  . Sexually Abused: Not on file    Family History  Problem Relation Age of Onset  . Diabetes Mother   . Stroke Mother        4 strokes  . Heart attack Mother   . Heart disease Mother   . Hypertension Father   . Hypertension Sister   . Heart disease Sister        pacemaker     Review of  Systems  Constitutional: Negative.  Negative for chills and fever.  HENT: Negative.  Negative for congestion and sore throat.   Eyes:       Chronic "white film" around both eyes for months. Changes in vision past few months.  Respiratory: Negative.  Negative for cough and shortness of breath.   Cardiovascular: Negative.  Negative for chest pain and leg swelling.  Gastrointestinal: Negative.  Negative for abdominal pain, blood in stool, diarrhea, nausea and vomiting.  Genitourinary: Negative.  Negative for dysuria.  Musculoskeletal:       Status post bilateral carpal tunnel syndrome surgeries.  Presently going through therapy. Chronic right foot pain.  Skin: Negative.  Negative for rash.  Neurological: Negative.  Negative for dizziness, focal weakness and headaches.  Endo/Heme/Allergies: Negative.   All other systems reviewed and are negative.  Today's Vitals   12/29/19 0909  BP: 127/82  Pulse: 84  Resp: 16  Temp: 98.1 F (36.7 C)  TempSrc: Temporal  SpO2: 96%  Weight: 260 lb (117.9 kg)  Height: 5\' 7"  (1.702 m)   Body mass index is 40.72 kg/m.   Physical Exam Vitals reviewed.  Constitutional:      Appearance: Normal appearance.  HENT:     Head: Normocephalic.  Eyes:     Extraocular Movements: Extraocular movements intact.     Conjunctiva/sclera: Conjunctivae normal.     Pupils: Pupils are equal, round, and reactive to light.  Cardiovascular:     Rate and Rhythm: Normal rate and regular rhythm.     Pulses: Normal pulses.     Heart sounds: Normal heart sounds.  Pulmonary:     Effort: Pulmonary effort is normal.     Breath sounds: Normal breath sounds.  Musculoskeletal:     Cervical back: Normal range of motion and neck supple.  Skin:    General: Skin is warm and dry.     Capillary Refill: Capillary refill takes less than 2 seconds.  Neurological:     General: No focal deficit present.     Mental Status: He is alert and oriented to person, place, and time.    Psychiatric:        Mood and Affect: Mood normal.        Behavior: Behavior normal.      ASSESSMENT & PLAN: Hypertension associated with diabetes (HCC) Well-controlled hypertension.  Continue present medication. Uncontrolled diabetes with hemoglobin A1c at 9.1, higher than before.  Continue Metformin 1000 mg twice a day and add Trulicity 0.75 mg weekly.  Refer to nutrition and diabetic services.  Follow-up in 3 months.  Fenris was seen today for diabetes.  Diagnoses and all orders for this visit:  Hypertension associated with diabetes (HCC) -     Comprehensive metabolic panel -     Dulaglutide (TRULICITY) 0.75 MG/0.5ML SOPN; Inject 0.75 mg into the skin once a week.  Dyslipidemia associated with type 2 diabetes mellitus (HCC) -     POCT glycosylated hemoglobin (Hb A1C) -     POCT glucose (manual entry) -     Lipid panel  Class 3 severe obesity with serious comorbidity and body mass index (BMI) of 40.0 to 44.9 in adult, unspecified obesity type (HCC)  Body mass index (BMI) of 40.1-44.9 in adult [facility]) -     Amb Referral to Nutrition and Diabetic E    Patient Instructions       If you have lab work done today you will be contacted with your lab results within the next 2 weeks.  If you have not heard from [facility] then please contact us. The fastest way to get your results is to register for My Chart.   IF you received an x-ray today, you will receive an invoice from [facility] Radiology. Please contact [facility] Radiology at [phone] with questions or concerns regarding your invoice.   IF you received labwork today, you will receive an invoice from [facility]. Please contact [facility] at [phone] with questions or concerns regarding your invoice.   Our billing staff will not be able to assist you with questions regarding bills from these companies.  You will be  contacted with the lab results as soon as they are available. The fastest way to get your results is to  activate your My Chart account. Instructions are located on the last page of this paperwork. If you have not heard from [place] regarding the results in 2 weeks, please contact this office.     Diabetes Mellitus and Nutrition, Adult When you have diabetes (diabetes mellitus), it is very important to have healthy eating habits because your blood sugar (glucose) levels are greatly affected by what you eat and drink. Eating healthy foods in the appropriate amounts, at about the same times every day, can help you:  Control your blood glucose.  Lower your risk of heart disease.  Improve your blood pressure.  Reach or maintain a healthy weight. Every person with diabetes is different, and each person has different needs for a meal plan. Your health care provider may recommend that you work with a diet and nutrition specialist (dietitian) to make a meal plan that is best for you. Your meal plan may vary depending on factors such as:  The calories you need.  The medicines you take.  Your weight.  Your blood glucose, blood pressure, and cholesterol levels.  Your activity level.  Other health conditions you have, such as heart or kidney disease. How do carbohydrates affect me? Carbohydrates, also called carbs, affect your blood glucose level more than any other type of food. Eating carbs naturally raises the amount of glucose in your blood. Carb counting is a method for keeping track of how many carbs you eat. Counting carbs is important to keep your blood glucose at a healthy level, especially if you use insulin or take certain oral diabetes medicines. It is important to know how many carbs you can safely have in each meal. This is different for every person. Your dietitian can help you calculate how many carbs you should have at each meal and for each snack. Foods that contain carbs include:  Bread, cereal, rice, pasta, and crackers.  Potatoes and corn.  Peas, beans, and lentils.  Milk and  yogurt.  Fruit and juice.  Desserts, such as cakes, cookies, ice cream, and candy. How does alcohol affect me? Alcohol can cause a sudden decrease in blood glucose (hypoglycemia), especially if you use insulin or take certain oral diabetes medicines. Hypoglycemia can be a life-threatening condition. Symptoms of hypoglycemia (sleepiness, dizziness, and confusion) are similar to symptoms of having too much alcohol. If your health care provider says that alcohol is safe for you, follow these guidelines:  Limit alcohol intake to no more than 1 drink per day for nonpregnant women and 2 drinks per day for men. One drink equals 12 oz of beer, 5 oz of wine, or 1 oz of hard liquor.  Do not drink on an empty stomach.  Keep yourself hydrated with water, diet soda, or unsweetened iced tea.  Keep in mind that regular soda, juice, and other mixers may contain a lot of sugar and must be counted as carbs. What are tips for following this plan?  Reading food labels  Start by checking the serving size on the "Nutrition Facts" label of packaged foods and drinks. The amount of calories, carbs, fats, and other nutrients listed on the label is based on one serving of the item. Many items contain more than one serving per package.  Check the total grams (g) of carbs in one serving. You can calculate the number of servings of carbs in one serving by  dividing the total carbs by 15. For example, if a food has 30 g of total carbs, it would be equal to 2 servings of carbs.  Check the number of grams (g) of saturated and trans fats in one serving. Choose foods that have low or no amount of these fats.  Check the number of milligrams (mg) of salt (sodium) in one serving. Most people should limit total sodium intake to less than 2,300 mg per day.  Always check the nutrition information of foods labeled as "low-fat" or "nonfat". These foods may be higher in added sugar or refined carbs and should be avoided.  Talk to  your dietitian to identify your daily goals for nutrients listed on the label. Shopping  Avoid buying canned, premade, or processed foods. These foods tend to be high in fat, sodium, and added sugar.  Shop around the outside edge of the grocery store. This includes fresh fruits and vegetables, bulk grains, fresh meats, and fresh dairy. Cooking  Use low-heat cooking methods, such as baking, instead of high-heat cooking methods like deep frying.  Cook using healthy oils, such as olive, canola, or sunflower oil.  Avoid cooking with butter, cream, or high-fat meats. Meal planning  Eat meals and snacks regularly, preferably at the same times every day. Avoid going long periods of time without eating.  Eat foods high in fiber, such as fresh fruits, vegetables, beans, and whole grains. Talk to your dietitian about how many servings of carbs you can eat at each meal.  Eat 4-6 ounces (oz) of lean protein each day, such as lean meat, chicken, fish, eggs, or tofu. One oz of lean protein is equal to: ? 1 oz of meat, chicken, or fish. ? 1 egg. ?  cup of tofu.  Eat some foods each day that contain healthy fats, such as avocado, nuts, seeds, and fish. Lifestyle  Check your blood glucose regularly.  Exercise regularly as told by your health care provider. This may include: ? 150 minutes of moderate-intensity or vigorous-intensity exercise each week. This could be brisk walking, biking, or water aerobics. ? Stretching and doing strength exercises, such as yoga or weightlifting, at least 2 times a week.  Take medicines as told by your health care provider.  Do not use any products that contain nicotine or tobacco, such as cigarettes and e-cigarettes. If you need help quitting, ask your health care provider.  Work with a Veterinary surgeon or diabetes educator to identify strategies to manage stress and any emotional and social challenges. Questions to ask a health care provider  Do I need to meet with  a diabetes educator?  Do I need to meet with a dietitian?  What number can I call if I have questions?  When are the best times to check my blood glucose? Where to find more information:  American Diabetes Association: diabetes.org  Academy of Nutrition and Dietetics: www.eatright.[employer] of Diabetes and Digestive and Kidney Diseases (NIH): CarFlippers.tn Summary  A healthy meal plan will help you control your blood glucose and maintain a healthy lifestyle.  Working with a diet and nutrition specialist (dietitian) can help you make a meal plan that is best for you.  Keep in mind that carbohydrates (carbs) and alcohol have immediate effects on your blood glucose levels. It is important to count carbs and to use alcohol carefully. This information is not intended to replace advice given to you by your health care provider. Make sure you discuss any questions you have  with your health care provider. Document Revised: 11/14/2017 Document Reviewed: 01/06/2017 Elsevier Patient Education  2020 Elsevier Inc.      Agustina Caroli, MD [facility]

## 2019-12-30 LAB — COMPREHENSIVE METABOLIC PANEL
ALT: 36 IU/L (ref 0–44)
AST: 22 IU/L (ref 0–40)
Albumin/Globulin Ratio: 1.8 (ref 1.2–2.2)
Albumin: 4.6 g/dL (ref 4.0–5.0)
Alkaline Phosphatase: 115 IU/L (ref 39–117)
BUN/Creatinine Ratio: 13 (ref 9–20)
BUN: 12 mg/dL (ref 6–24)
Bilirubin Total: 0.3 mg/dL (ref 0.0–1.2)
CO2: 24 mmol/L (ref 20–29)
Calcium: 9.9 mg/dL (ref 8.7–10.2)
Chloride: 99 mmol/L (ref 96–106)
Creatinine, Ser: 0.9 mg/dL (ref 0.76–1.27)
GFR calc Af Amer: 116 mL/min/{1.73_m2} (ref >59)
GFR calc non Af Amer: 100 mL/min/{1.73_m2} (ref >59)
Globulin, Total: 2.6 g/dL (ref 1.5–4.5)
Glucose: 217 mg/dL — ABNORMAL HIGH (ref 65–99)
Potassium: 4.6 mmol/L (ref 3.5–5.2)
Sodium: 140 mmol/L (ref 134–144)
Total Protein: 7.2 g/dL (ref 6.0–8.5)

## 2019-12-30 LAB — LIPID PANEL
Chol/HDL Ratio: 4.9 ratio (ref 0.0–5.0)
Cholesterol, Total: 138 mg/dL (ref 100–199)
HDL: 28 mg/dL — ABNORMAL LOW (ref >39)
LDL Chol Calc (NIH): 74 mg/dL (ref 0–99)
Triglycerides: 214 mg/dL — ABNORMAL HIGH (ref 0–149)
VLDL Cholesterol Cal: 36 mg/dL (ref 5–40)

## 2019-12-31 ENCOUNTER — Encounter: Payer: Self-pay | Admitting: Emergency Medicine

## 2019-12-31 ENCOUNTER — Other Ambulatory Visit: Payer: Self-pay | Admitting: Orthopedic Surgery

## 2019-12-31 DIAGNOSIS — M779 Enthesopathy, unspecified: Secondary | ICD-10-CM

## 2019-12-31 DIAGNOSIS — M25512 Pain in left shoulder: Secondary | ICD-10-CM

## 2020-01-06 DIAGNOSIS — M25642 Stiffness of left hand, not elsewhere classified: Secondary | ICD-10-CM | POA: Diagnosis not present

## 2020-01-06 DIAGNOSIS — M79642 Pain in left hand: Secondary | ICD-10-CM | POA: Diagnosis not present

## 2020-01-06 DIAGNOSIS — G5603 Carpal tunnel syndrome, bilateral upper limbs: Secondary | ICD-10-CM | POA: Diagnosis not present

## 2020-01-06 DIAGNOSIS — M25532 Pain in left wrist: Secondary | ICD-10-CM | POA: Diagnosis not present

## 2020-01-11 ENCOUNTER — Other Ambulatory Visit: Payer: Self-pay | Admitting: Orthopedic Surgery

## 2020-01-12 ENCOUNTER — Encounter: Payer: Self-pay | Admitting: Dietician

## 2020-01-12 ENCOUNTER — Ambulatory Visit
Admission: RE | Admit: 2020-01-12 | Discharge: 2020-01-12 | Disposition: A | Discharge status: Home / Self Care | Payer: BC Managed Care – PPO | Source: Ambulatory Visit | Attending: Orthopedic Surgery | Admitting: Orthopedic Surgery

## 2020-01-12 ENCOUNTER — Other Ambulatory Visit: Payer: Self-pay

## 2020-01-12 ENCOUNTER — Encounter: Payer: BC Managed Care – PPO | Attending: Emergency Medicine | Admitting: Dietician

## 2020-01-12 DIAGNOSIS — E1169 Type 2 diabetes mellitus with other specified complication: Secondary | ICD-10-CM | POA: Diagnosis not present

## 2020-01-12 DIAGNOSIS — M779 Enthesopathy, unspecified: Secondary | ICD-10-CM

## 2020-01-12 DIAGNOSIS — E785 Hyperlipidemia, unspecified: Secondary | ICD-10-CM | POA: Insufficient documentation

## 2020-01-12 DIAGNOSIS — S46012A Strain of muscle(s) and tendon(s) of the rotator cuff of left shoulder, initial encounter: Secondary | ICD-10-CM | POA: Diagnosis not present

## 2020-01-12 DIAGNOSIS — M25512 Pain in left shoulder: Secondary | ICD-10-CM

## 2020-01-12 NOTE — Progress Notes (Signed)
Medical Nutrition Therapy  Appt Start Time: 11:00am   End Time: 12:00pm  Primary concerns today: weight management   Referral diagnosis: E66.01- morbid (severe) obesity d/t excess calories Preferred learning style: no preference indicated Learning readiness: contemplating   NUTRITION ASSESSMENT   Clinical Medical Hx: obesity, T2DM, HTN, hypercholesterolemia, sleep apnea  Labs: self-reported fasting BG: 170-180 typically   Lifestyle & Dietary Hx Patient states his diet used to consist more of eggs and bacon for breakfast, eating out a lot for lunch/dinner. May skip lunch. Would like to avoid eating beef and start cutting back on pork. May snack on nuts. Typical meal pattern is 2-3 meals per day plus snacks.   24-Hr Dietary Recall First Meal: high protein oatmeal + tea w/ cayenne & honey Snack: nuts Second Meal: Glucerna  Snack: - Third Meal: pork chops + sauteed vegetables  Snack: Glucerna Beverages: hot tea, chocolate milk, water, soda  Estimated Energy Needs Calories: 2200 Carbohydrate: 248g Protein: 138g Fat: 73g   NUTRITION DIAGNOSIS  Food and nutrition-related knowledge deficit (NB-1.1) related to lack of prior nutrition education by a nutrition professional as evidenced by questions presented by patient about weight management and nutrition.    NUTRITION INTERVENTION  Nutrition education (E-1) on the following topics:  . General healthful eating- especially focus on fiber, protein, and water  Handouts Provided Include   MyPlate Portions  Meal Ideas   Balanced Snacks   Learning Style & Readiness for Change Teaching method utilized: Visual & Auditory  Demonstrated degree of understanding via: Teach Back  Barriers to learning/adherence to lifestyle change: Contemplative Stage of Change   MONITORING & EVALUATION Dietary intake, weekly physical activity, and goals prn.  Next Steps  Patient is to contact [facility] to schedule follow up visit as needed.

## 2020-01-14 DIAGNOSIS — M7542 Impingement syndrome of left shoulder: Secondary | ICD-10-CM | POA: Insufficient documentation

## 2020-01-18 DIAGNOSIS — G5603 Carpal tunnel syndrome, bilateral upper limbs: Secondary | ICD-10-CM | POA: Diagnosis not present

## 2020-01-18 DIAGNOSIS — M65332 Trigger finger, left middle finger: Secondary | ICD-10-CM | POA: Diagnosis not present

## 2020-01-22 ENCOUNTER — Other Ambulatory Visit: Payer: Self-pay

## 2020-01-26 DIAGNOSIS — M19012 Primary osteoarthritis, left shoulder: Secondary | ICD-10-CM | POA: Diagnosis not present

## 2020-01-26 DIAGNOSIS — G8918 Other acute postprocedural pain: Secondary | ICD-10-CM | POA: Diagnosis not present

## 2020-01-26 DIAGNOSIS — M7542 Impingement syndrome of left shoulder: Secondary | ICD-10-CM | POA: Diagnosis not present

## 2020-02-01 DIAGNOSIS — Z9889 Other specified postprocedural states: Secondary | ICD-10-CM | POA: Insufficient documentation

## 2020-02-02 DIAGNOSIS — Z7409 Other reduced mobility: Secondary | ICD-10-CM | POA: Diagnosis not present

## 2020-02-02 DIAGNOSIS — Z9889 Other specified postprocedural states: Secondary | ICD-10-CM | POA: Diagnosis not present

## 2020-02-02 DIAGNOSIS — M25612 Stiffness of left shoulder, not elsewhere classified: Secondary | ICD-10-CM | POA: Diagnosis not present

## 2020-02-02 DIAGNOSIS — M25512 Pain in left shoulder: Secondary | ICD-10-CM | POA: Diagnosis not present

## 2020-02-21 DIAGNOSIS — Z9889 Other specified postprocedural states: Secondary | ICD-10-CM | POA: Diagnosis not present

## 2020-02-21 DIAGNOSIS — M25512 Pain in left shoulder: Secondary | ICD-10-CM | POA: Diagnosis not present

## 2020-02-21 DIAGNOSIS — M25612 Stiffness of left shoulder, not elsewhere classified: Secondary | ICD-10-CM | POA: Diagnosis not present

## 2020-02-21 DIAGNOSIS — Z7409 Other reduced mobility: Secondary | ICD-10-CM | POA: Diagnosis not present

## 2020-03-30 ENCOUNTER — Ambulatory Visit: Payer: BC Managed Care – PPO | Admitting: Emergency Medicine

## 2020-03-31 ENCOUNTER — Emergency Department (HOSPITAL_COMMUNITY): Payer: BC Managed Care – PPO

## 2020-03-31 ENCOUNTER — Encounter (HOSPITAL_COMMUNITY): Payer: Self-pay | Admitting: Emergency Medicine

## 2020-03-31 ENCOUNTER — Other Ambulatory Visit: Payer: Self-pay

## 2020-03-31 ENCOUNTER — Observation Stay (HOSPITAL_COMMUNITY)
Admission: EM | Admit: 2020-03-31 | Discharge: 2020-04-01 | Disposition: A | Discharge status: Home / Self Care | Payer: BC Managed Care – PPO | Attending: Family Medicine | Admitting: Family Medicine

## 2020-03-31 DIAGNOSIS — Z8249 Family history of ischemic heart disease and other diseases of the circulatory system: Secondary | ICD-10-CM | POA: Diagnosis not present

## 2020-03-31 DIAGNOSIS — Z791 Long term (current) use of non-steroidal anti-inflammatories (NSAID): Secondary | ICD-10-CM | POA: Diagnosis not present

## 2020-03-31 DIAGNOSIS — Z881 Allergy status to other antibiotic agents status: Secondary | ICD-10-CM | POA: Diagnosis not present

## 2020-03-31 DIAGNOSIS — R778 Other specified abnormalities of plasma proteins: Secondary | ICD-10-CM | POA: Diagnosis not present

## 2020-03-31 DIAGNOSIS — R0789 Other chest pain: Secondary | ICD-10-CM | POA: Diagnosis not present

## 2020-03-31 DIAGNOSIS — Z7984 Long term (current) use of oral hypoglycemic drugs: Secondary | ICD-10-CM | POA: Diagnosis not present

## 2020-03-31 DIAGNOSIS — R002 Palpitations: Secondary | ICD-10-CM | POA: Insufficient documentation

## 2020-03-31 DIAGNOSIS — E119 Type 2 diabetes mellitus without complications: Secondary | ICD-10-CM | POA: Insufficient documentation

## 2020-03-31 DIAGNOSIS — E785 Hyperlipidemia, unspecified: Secondary | ICD-10-CM | POA: Diagnosis not present

## 2020-03-31 DIAGNOSIS — G4733 Obstructive sleep apnea (adult) (pediatric): Secondary | ICD-10-CM | POA: Diagnosis not present

## 2020-03-31 DIAGNOSIS — R0602 Shortness of breath: Secondary | ICD-10-CM | POA: Diagnosis not present

## 2020-03-31 DIAGNOSIS — R911 Solitary pulmonary nodule: Secondary | ICD-10-CM | POA: Diagnosis not present

## 2020-03-31 DIAGNOSIS — R079 Chest pain, unspecified: Secondary | ICD-10-CM | POA: Diagnosis present

## 2020-03-31 DIAGNOSIS — I10 Essential (primary) hypertension: Secondary | ICD-10-CM | POA: Insufficient documentation

## 2020-03-31 DIAGNOSIS — E1169 Type 2 diabetes mellitus with other specified complication: Secondary | ICD-10-CM

## 2020-03-31 DIAGNOSIS — R072 Precordial pain: Secondary | ICD-10-CM

## 2020-03-31 DIAGNOSIS — Z6841 Body Mass Index (BMI) 40.0 and over, adult: Secondary | ICD-10-CM | POA: Diagnosis not present

## 2020-03-31 DIAGNOSIS — E7800 Pure hypercholesterolemia, unspecified: Secondary | ICD-10-CM | POA: Diagnosis not present

## 2020-03-31 DIAGNOSIS — Z20822 Contact with and (suspected) exposure to covid-19: Secondary | ICD-10-CM | POA: Diagnosis not present

## 2020-03-31 DIAGNOSIS — G4730 Sleep apnea, unspecified: Secondary | ICD-10-CM | POA: Diagnosis not present

## 2020-03-31 DIAGNOSIS — Z79899 Other long term (current) drug therapy: Secondary | ICD-10-CM | POA: Insufficient documentation

## 2020-03-31 DIAGNOSIS — I2510 Atherosclerotic heart disease of native coronary artery without angina pectoris: Secondary | ICD-10-CM | POA: Diagnosis not present

## 2020-03-31 DIAGNOSIS — R7989 Other specified abnormal findings of blood chemistry: Secondary | ICD-10-CM | POA: Diagnosis not present

## 2020-03-31 LAB — BASIC METABOLIC PANEL
Anion gap: 10 (ref 5–15)
BUN: 11 mg/dL (ref 6–20)
CO2: 26 mmol/L (ref 22–32)
Calcium: 9.5 mg/dL (ref 8.9–10.3)
Chloride: 105 mmol/L (ref 98–111)
Creatinine, Ser: 0.97 mg/dL (ref 0.61–1.24)
GFR calc Af Amer: >60 mL/min (ref >60)
GFR calc non Af Amer: >60 mL/min (ref >60)
Glucose, Bld: 104 mg/dL — ABNORMAL HIGH (ref 70–99)
Potassium: 4.1 mmol/L (ref 3.5–5.1)
Sodium: 141 mmol/L (ref 135–145)

## 2020-03-31 LAB — CBC
HCT: 44.5 % (ref 39.0–52.0)
Hemoglobin: 13.4 g/dL (ref 13.0–17.0)
MCH: 23.6 pg — ABNORMAL LOW (ref 26.0–34.0)
MCHC: 30.1 g/dL (ref 30.0–36.0)
MCV: 78.3 fL — ABNORMAL LOW (ref 80.0–100.0)
Platelets: 315 10*3/uL (ref 150–400)
RBC: 5.68 MIL/uL (ref 4.22–5.81)
RDW: 16.3 % — ABNORMAL HIGH (ref 11.5–15.5)
WBC: 9.3 10*3/uL (ref 4.0–10.5)
nRBC: 0 % (ref 0.0–0.2)

## 2020-03-31 LAB — CBG MONITORING, ED: Glucose-Capillary: 100 mg/dL — ABNORMAL HIGH (ref 70–99)

## 2020-03-31 LAB — TROPONIN I (HIGH SENSITIVITY): Troponin I (High Sensitivity): 6 ng/L (ref <18)

## 2020-03-31 MED ORDER — SODIUM CHLORIDE 0.9% FLUSH: 3.0000 mL | Freq: Once | INTRAVENOUS | Status: DC

## 2020-03-31 NOTE — ED Triage Notes (Signed)
Pt c/o mid cp with some sob and diaphoresis, last covid vaccine today in the morning.

## 2020-04-01 ENCOUNTER — Other Ambulatory Visit (HOSPITAL_COMMUNITY): Payer: BC Managed Care – PPO

## 2020-04-01 ENCOUNTER — Other Ambulatory Visit: Payer: Self-pay

## 2020-04-01 ENCOUNTER — Observation Stay (HOSPITAL_COMMUNITY): Payer: BC Managed Care – PPO

## 2020-04-01 ENCOUNTER — Encounter (HOSPITAL_COMMUNITY): Payer: Self-pay | Admitting: Family Medicine

## 2020-04-01 DIAGNOSIS — Z9989 Dependence on other enabling machines and devices: Secondary | ICD-10-CM

## 2020-04-01 DIAGNOSIS — I10 Essential (primary) hypertension: Secondary | ICD-10-CM | POA: Diagnosis not present

## 2020-04-01 DIAGNOSIS — R7989 Other specified abnormal findings of blood chemistry: Secondary | ICD-10-CM | POA: Diagnosis not present

## 2020-04-01 DIAGNOSIS — R079 Chest pain, unspecified: Secondary | ICD-10-CM | POA: Diagnosis present

## 2020-04-01 DIAGNOSIS — E119 Type 2 diabetes mellitus without complications: Secondary | ICD-10-CM

## 2020-04-01 DIAGNOSIS — R002 Palpitations: Secondary | ICD-10-CM | POA: Diagnosis not present

## 2020-04-01 DIAGNOSIS — K760 Fatty (change of) liver, not elsewhere classified: Secondary | ICD-10-CM

## 2020-04-01 DIAGNOSIS — G4733 Obstructive sleep apnea (adult) (pediatric): Secondary | ICD-10-CM

## 2020-04-01 LAB — TROPONIN I (HIGH SENSITIVITY)
Troponin I (High Sensitivity): 18 ng/L — ABNORMAL HIGH (ref <18)
Troponin I (High Sensitivity): 16 ng/L (ref <18)

## 2020-04-01 LAB — CBG MONITORING, ED: Glucose-Capillary: 109 mg/dL — ABNORMAL HIGH (ref 70–99)

## 2020-04-01 LAB — SARS CORONAVIRUS 2 (TAT 6-24 HRS): SARS Coronavirus 2: NEGATIVE

## 2020-04-01 LAB — HEPARIN LEVEL (UNFRACTIONATED): Heparin Unfractionated: 0.37 IU/mL (ref 0.30–0.70)

## 2020-04-01 LAB — HEMOGLOBIN A1C
Hgb A1c MFr Bld: 7.7 % — ABNORMAL HIGH (ref 4.8–5.6)
Mean Plasma Glucose: 174.29 mg/dL

## 2020-04-01 LAB — TSH: TSH: 1.391 u[IU]/mL (ref 0.350–4.500)

## 2020-04-01 LAB — D-DIMER, QUANTITATIVE: D-Dimer, Quant: <0.27 ug/mL-FEU (ref 0.00–0.50)

## 2020-04-01 LAB — MAGNESIUM: Magnesium: 1.7 mg/dL (ref 1.7–2.4)

## 2020-04-01 MED ORDER — METOPROLOL TARTRATE 5 MG/5ML IV SOLN
5.0000 mg | INTRAVENOUS | Status: DC | PRN
  Administered 2020-04-01: 5 mg via INTRAVENOUS
  Filled 2020-04-01: qty 5

## 2020-04-01 MED ORDER — ONDANSETRON HCL 4 MG/2ML IJ SOLN: 4.0000 mg | Freq: Four times a day (QID) | INTRAMUSCULAR | Status: DC | PRN

## 2020-04-01 MED ORDER — METOPROLOL TARTRATE 25 MG PO TABS
50.0000 mg | ORAL_TABLET | Freq: Once | ORAL | Status: AC
  Administered 2020-04-01: 50 mg via ORAL
  Filled 2020-04-01: qty 2

## 2020-04-01 MED ORDER — HEPARIN BOLUS VIA INFUSION
4000.0000 [IU] | Freq: Once | INTRAVENOUS | Status: AC
  Administered 2020-04-01: 4000 [IU] via INTRAVENOUS
  Filled 2020-04-01: qty 4000

## 2020-04-01 MED ORDER — ATORVASTATIN CALCIUM 20 MG PO TABS: 40.0000 mg | ORAL_TABLET | Freq: Every day | ORAL | 3 refills | Status: DC

## 2020-04-01 MED ORDER — METOPROLOL TARTRATE 25 MG PO TABS
12.5000 mg | ORAL_TABLET | Freq: Two times a day (BID) | ORAL | Status: DC
  Administered 2020-04-01: 12.5 mg via ORAL
  Filled 2020-04-01: qty 1

## 2020-04-01 MED ORDER — ACETAMINOPHEN 325 MG PO TABS: 650.0000 mg | ORAL_TABLET | ORAL | Status: DC | PRN

## 2020-04-01 MED ORDER — ASPIRIN EC 81 MG PO TBEC: 81.0000 mg | DELAYED_RELEASE_TABLET | Freq: Every day | ORAL | Status: DC

## 2020-04-01 MED ORDER — INSULIN ASPART 100 UNIT/ML ~~LOC~~ SOLN: 0.0000 [IU] | Freq: Three times a day (TID) | SUBCUTANEOUS | Status: DC

## 2020-04-01 MED ORDER — ASPIRIN 81 MG PO CHEW
324.0000 mg | CHEWABLE_TABLET | Freq: Once | ORAL | Status: AC
  Administered 2020-04-01: 324 mg via ORAL
  Filled 2020-04-01: qty 4

## 2020-04-01 MED ORDER — IOHEXOL 350 MG/ML SOLN
80.0000 mL | Freq: Once | INTRAVENOUS | Status: AC | PRN
  Administered 2020-04-01: 80 mL via INTRAVENOUS

## 2020-04-01 MED ORDER — ATORVASTATIN CALCIUM 40 MG PO TABS: 40.0000 mg | ORAL_TABLET | Freq: Every day | ORAL | Status: DC

## 2020-04-01 MED ORDER — NITROGLYCERIN 0.4 MG SL SUBL
0.8000 mg | SUBLINGUAL_TABLET | Freq: Once | SUBLINGUAL | Status: DC | PRN
  Filled 2020-04-01: qty 2

## 2020-04-01 MED ORDER — HEPARIN (PORCINE) 25000 UT/250ML-% IV SOLN
1350.0000 [IU]/h | INTRAVENOUS | Status: DC
  Administered 2020-04-01: 1350 [IU]/h via INTRAVENOUS
  Filled 2020-04-01: qty 250

## 2020-04-01 MED ORDER — ASPIRIN 81 MG PO TBEC: 81.0000 mg | DELAYED_RELEASE_TABLET | Freq: Every day | ORAL | 0 refills | Status: AC

## 2020-04-01 MED ORDER — NITROGLYCERIN 0.4 MG SL SUBL: 0.4000 mg | SUBLINGUAL_TABLET | SUBLINGUAL | Status: DC | PRN

## 2020-04-01 NOTE — ED Notes (Signed)
Pt went to CT

## 2020-04-01 NOTE — Discharge Summary (Addendum)
Discharge Summary  Walter Reynolds. YIR:[id number] DOB: 09/04/1971  PCP: Walter Quint, MD  Admit date: 03/31/2020 Discharge date: 04/01/2020  Time spent:  Recommendations for Outpatient Follow-up:  1. F/u with PCP within a week  for hospital discharge follow up, repeat cbc/bmp at follow up 2. F/u with cardiology  Discharge Diagnoses:  Active Hospital Problems   Diagnosis Date Noted  . Chest pain 04/01/2020  . Diabetes mellitus type II, non insulin dependent (HCC) 09/03/2017  . Benign essential HTN 09/06/2015    Resolved Hospital Problems  No resolved problems to display.    Discharge Condition: stable  Diet recommendation: heart healthy/carb modified  Filed Weights   03/31/20 2107  Weight: 117.9 kg    History of present illness: (per admitting MD ) PCP: Walter Quint, MD   Patient coming from: Home   Chief Complaint: Chest discomfort, SOB, diaphoresis   HPI: Walter Reynolds. is a 49 y.o. male with medical history significant for hypertension, hyperlipidemia, and type 2 diabetes mellitus, now presenting to the emergency department for evaluation of chest discomfort, shortness of breath, and diaphoresis.  Patient reports that he received a Covid vaccination the morning of 03/31/2020, was having an uneventful day following this, and began to mow his lawn between 6 and 7 PM when he developed an intense discomfort in the central chest that he has difficulty characterizing.  Chest discomfort was associated with shortness of breath and diaphoresis.  He headed to the emergency department shortly after symptom onset and the symptoms began to ease off and resolved.  He has not been coughing, denies any fevers or chills, and denies any leg swelling or tenderness.  He reports undergoing a stress test approximately 5 years ago but does not recall what type of symptoms he was experiencing that led to this test.  He reports a family history of heart disease in  his mother which she believes began in her late 67s.  He does not smoke.  ED Course: Upon arrival to the ED, patient is found to be afebrile, saturating well on room air, and with stable blood pressure.  EKG features sinus tachycardia with rate 103 and left axis deviation.  No acute findings on chest x-ray.  Chemistry panel is unremarkable and CBC with mild microcytosis without anemia.  Initial high-sensitivity troponin is normal and second is elevated to 26.  Patient was given 324 mg of aspirin in the ED, Covid screening test was ordered, cardiology was consulted by the ED physician, and hospitalist asked to admit.  Hospital Course:  Principal Problem:   Chest pain Active Problems:   Benign essential HTN   Diabetes mellitus type II, non insulin dependent (HCC)  Chest pain /palpitation with  Mild elevation of high sensitivity troponin at 26 Per chart review, he has a negative cardiac stress test in 2016 ekg no acute ischemia changes , ekg/tele no arrythmia identified  Cardiology consulted, CTA coronary recommended which showed clean coronary,  Case discussed with cardiology Dr Cristal Deer over the phone who cleared patient to discharge home, Dr Cristal Deer will arrange outpatient cardiology follow up Recommend outpatient echocardiogram by cardiology  He is advised to take daily asa, increase lipitor dose from 20mg  daily to 40 mg daily   HTN: stable on home meds lisinopril  noninsulin dependent Diabetes, uncontrolled a1c 7.7  Continue home meds ,follow up with pcp  Class III Obesity/osa/cpap Body mass index is 40.71 kg/m.    5 mm peripheral nodule in the left lower lobe  is indeterminate. No follow-up needed if patient is low-risk. Non-contrast chest CT can be considered in 12 months if patient is high-risk  Possible hepatic steatosis incidental findings on CT scan Continue statin and weight management F/u with pcp   Procedures:  Coronary CTA  Consultations:  Cardiology    Discharge Exam: BP 118/86 (BP Location: Right Arm)   Pulse 76   Temp 97.7 F (36.5 C) (Oral)   Resp 14   Ht 5\' 7"  (1.702 m)   Wt 117.9 kg   SpO2 94%   BMI 40.71 kg/m   General: NAD Cardiovascular: RRR Respiratory: CTABL  Discharge Instructions You were cared for by a hospitalist during your hospital stay. If you have any questions about your discharge medications or the care you received while you were in the hospital after you are discharged, you can call the unit and asked to speak with the hospitalist on call if the hospitalist that took care of you is not available. Once you are discharged, your primary care physician will handle any further medical issues. Please note that NO REFILLS for any discharge medications will be authorized once you are discharged, as it is imperative that you return to your primary care physician (or establish a relationship with a primary care physician if you do not have one) for your aftercare needs so that they can reassess your need for medications and monitor your lab values.  Discharge Instructions    Diet - low sodium heart healthy   Complete by: As directed    Carb modified   Increase activity slowly   Complete by: As directed      Allergies as of 04/01/2020      Reactions   Doxycycline Hives   Erythromycin Base Hives      Medication List    STOP taking these medications   diclofenac 75 MG EC tablet Commonly known as: VOLTAREN   Farxiga 5 MG Tabs tablet Generic drug: dapagliflozin propanediol   neomycin-polymyxin-hydrocortisone OTIC solution Commonly known as: CORTISPORIN   Trulicity 7.35 HG/9.9ME Sopn Generic drug: Dulaglutide     TAKE these medications   aspirin 81 MG EC tablet Take 1 tablet (81 mg total) by mouth daily. Start taking on: April 02, 2020   atorvastatin 20 MG tablet Commonly known as: LIPITOR Take 2 tablets (40 mg total) by mouth daily. What changed: how much to take   fluticasone 50 MCG/ACT nasal  spray Commonly known as: FLONASE SPRAY 2 SPRAYS INTO EACH NOSTRIL EVERY DAY What changed: See the new instructions.   lisinopril 20 MG tablet Commonly known as: ZESTRIL Take 1 tablet (20 mg total) by mouth daily.   metFORMIN 1000 MG tablet Commonly known as: GLUCOPHAGE Take 1 tablet (1,000 mg total) by mouth 2 (two) times daily with a meal.      Allergies  Allergen Reactions  . Doxycycline Hives  . Erythromycin Base Hives   Follow-up Information    Horald Pollen, MD Follow up.   Specialty: Internal Medicine Why: hospital discharge follow up 5 mm peripheral nodule in the left lower lobe is indeterminate. No follow-up needed if patient is low-risk. Non-contrast chest CT can be considered in 12 months if patient is high-risk pcp to decide on follow up ct chest Contact information: [place] [address] [phone]        [facility] Follow up.   Specialty: Cardiology Contact information: [address], [place]  The results of significant diagnostics from this hospitalization (including imaging, microbiology, ancillary and laboratory) are listed below for reference.    Significant Diagnostic Studies: DG Chest 2 View  Result Date: 03/31/2020 CLINICAL DATA:  Hypertension, tachycardia, shortness of breath EXAM: CHEST - 2 VIEW COMPARISON:  10/15/2016 FINDINGS: The heart size and mediastinal contours are within normal limits. Both lungs are clear. The visualized skeletal structures are unremarkable. IMPRESSION: No active cardiopulmonary disease. Electronically Signed   By: Sharlet Salina M.D.   On: 03/31/2020 21:50   CT CORONARY MORPH W/CTA COR W/SCORE W/CA W/CM &/OR WO/CM  Addendum Date: 04/01/2020   ADDENDUM REPORT: 04/01/2020 15:30 CLINICAL DATA:  22M with chest pain EXAM: Cardiac/Coronary CTA TECHNIQUE: The patient was scanned on a [employer].  FINDINGS: A 100 kV prospective scan was triggered in the descending thoracic aorta at 111 HU's. Axial non-contrast 3 mm slices were carried out through the heart. The data set was analyzed on a dedicated work station and scored using the Agatson method. Gantry rotation speed was 250 msecs and collimation was .6 mm. No beta blockade and 0.8 mg of sl NTG was given. The 3D data set was reconstructed in 5% intervals of the 67-82 % of the R-R cycle. Diastolic phases were analyzed on a dedicated work station using MPR, MIP and VRT modes. The patient received 80 cc of contrast. Coronary Arteries:  Normal coronary origin.  Right dominance. RCA is a large dominant artery that gives rise to PDA and PLA. There is mixed plaque in the proximal RCA causing mild (25-49%) stenosis. Left main is a large artery that gives rise to LAD, LCX, and ramus arteries. LAD is a large vessel. There is calcified plaque in the mid LAD causing minimal (0-24%) stenosis. LCX is a non-dominant artery.  There is no plaque. Other findings: Left Ventricle: Normal size Left Atrium: Normal size Pulmonary Veins: Normal configuration Right Ventricle: Mild dilatation Right Atrium: Normal size Cardiac valves: No calcifications Thoracic aorta: Normal size Pulmonary Arteries: Normal size Systemic Veins: Normal drainage Pericardium: Normal thickness IMPRESSION: 1. Coronary calcium score of 110. This was 95th percentile for age and sex matched control. 2. Normal coronary origin with right dominance. 3. Nonobstructive CAD with mixed plaque in the proximal RCA causing mild (25-49%) stenosis CAD-RADS 1. Minimal non-obstructive CAD (0-24%). Consider non-atherosclerotic causes of chest pain. Consider preventive therapy and risk factor modification. Electronically Signed   By: Epifanio Lesches MD   On: 04/01/2020 15:30   Result Date: 04/01/2020 EXAM: OVER-READ INTERPRETATION  CT CHEST The following report is an over-read performed by radiologist Dr. Richarda Overlie of  [facility] on 04/01/2020. This over-read does not include interpretation of cardiac or coronary anatomy or pathology. The coronary calcium score/coronary CTA interpretation by the cardiologist is attached. COMPARISON:  None. FINDINGS: Vascular: Normal caliber of the visualized thoracic aorta without atherosclerotic disease. Main pulmonary arteries are patent. No significant pericardial effusion. Mediastinum/Nodes: Right hilar tissue is mildly prominent measuring up to 0.8 cm on sequence 12, image 23. Overall, the mediastinal structures and hilar structures are unremarkable. Lungs/Pleura: No large pleural effusions. Poorly defined peripheral densities in both lower lungs are nonspecific and could represent atelectasis. Poorly defined nodule in the periphery of the left lower lobe on sequence 13, image 14 measures 5 mm. Upper Abdomen: Visualized liver is low density and raises concern for hepatic steatosis. Musculoskeletal: No acute bone abnormality. IMPRESSION: 1. No acute abnormality. 2. Small peripheral densities along the lower lobes are nonspecific and  likely related to atelectasis. 5 mm peripheral nodule in the left lower lobe is indeterminate. No follow-up needed if patient is low-risk. Non-contrast chest CT can be considered in 12 months if patient is high-risk. This recommendation follows the consensus statement: Guidelines for Management of Incidental Pulmonary Nodules Detected on CT Images: From the Fleischner Society 2017; Radiology 2017; 284:228-243. 3. Visualized liver has low-density and suspicious for hepatic steatosis. Electronically Signed: By: Richarda Overlie M.D. On: 04/01/2020 13:31    Microbiology: Recent Results (from the past 240 hour(s))  SARS CORONAVIRUS 2 (TAT 6-24 HRS) Nasopharyngeal Nasopharyngeal Swab     Status: None   Collection Time: 04/01/20  5:17 AM   Specimen: Nasopharyngeal Swab  Result Value Ref Range Status   SARS Coronavirus 2 NEGATIVE NEGATIVE Final     Comment: (NOTE) SARS-CoV-2 target nucleic acids are NOT DETECTED. The SARS-CoV-2 RNA is generally detectable in upper and lower respiratory specimens during the acute phase of infection. Negative results do not preclude SARS-CoV-2 infection, do not rule out co-infections with other pathogens, and should not be used as the sole basis for treatment or other patient management decisions. Negative results must be combined with clinical observations, patient history, and epidemiological information. The expected result is Negative. Fact Sheet for Patients: HairSlick.no Fact Sheet for Healthcare Providers: quierodirigir.com This test is not yet approved or cleared by the [place] FDA and  has been authorized for detection and/or diagnosis of SARS-CoV-2 by FDA under an Emergency Use Authorization (EUA). This EUA will remain  in effect (meaning this test can be used) for the duration of the COVID-19 declaration under Section 56 4(b)(1) of the Act, 21 U.S.C. section 360bbb-3(b)(1), unless the authorization is terminated or revoked sooner. Performed at [facility], [address], [place], [place] [address]      Labs: Basic Metabolic Panel: Recent Labs  Lab 03/31/20 2115 04/01/20 1052  NA 141  --   K 4.1  --   CL 105  --   CO2 26  --   GLUCOSE 104*  --   BUN 11  --   CREATININE 0.97  --   CALCIUM 9.5  --   MG  --  1.7   Liver Function Tests: No results for input(s): AST, ALT, ALKPHOS, BILITOT, PROT, ALBUMIN in the last 168 hours. No results for input(s): LIPASE, AMYLASE in the last 168 hours. No results for input(s): AMMONIA in the last 168 hours. CBC: Recent Labs  Lab 03/31/20 2115  WBC 9.3  HGB 13.4  HCT 44.5  MCV 78.3*  PLT 315   Cardiac Enzymes: No results for input(s): CKTOTAL, CKMB, CKMBINDEX, TROPONINI in the last 168 hours. BNP: BNP (last 3 results) No results for input(s): BNP in the last 8760  hours.  ProBNP (last 3 results) No results for input(s): PROBNP in the last 8760 hours.  CBG: Recent Labs  Lab 03/31/20 2335 04/01/20 1147  GLUCAP 100* 109*       Signed:  Albertine Grates MD, PhD, FACP  [facility] 04/01/2020, 3:42 PM

## 2020-04-01 NOTE — ED Notes (Signed)
ED Provider at bedside. 

## 2020-04-01 NOTE — H&P (Signed)
History and Physical    Dola Factor. EVO:[id number] DOB: [birth year] DOA: 03/31/2020  PCP: Horald Pollen, MD   Patient coming from: Home   Chief Complaint: Chest discomfort, SOB, diaphoresis   HPI: Walter Wach. is a 49 y.o. male with medical history significant for hypertension, hyperlipidemia, and type 2 diabetes mellitus, now presenting to the emergency department for evaluation of chest discomfort, shortness of breath, and diaphoresis.  Patient reports that he received a Covid vaccination the morning of 03/31/2020, was having an uneventful day following this, and began to mow his lawn between 6 and 7 PM when he developed an intense discomfort in the central chest that he has difficulty characterizing.  Chest discomfort was associated with shortness of breath and diaphoresis.  He headed to the emergency department shortly after symptom onset and the symptoms began to ease off and resolved.  He has not been coughing, denies any fevers or chills, and denies any leg swelling or tenderness.  He reports undergoing a stress test approximately 5 years ago but does not recall what type of symptoms he was experiencing that led to this test.  He reports a family history of heart disease in his mother which she believes began in her late 49s.  He does not smoke.  ED Course: Upon arrival to the ED, patient is found to be afebrile, saturating well on room air, and with stable blood pressure.  EKG features sinus tachycardia with rate 103 and left axis deviation.  No acute findings on chest x-ray.  Chemistry panel is unremarkable and CBC with mild microcytosis without anemia.  Initial high-sensitivity troponin is normal and second is elevated to 26.  Patient was given 324 mg of aspirin in the ED, Covid screening test was ordered, cardiology was consulted by the ED physician, and hospitalist asked to admit.  Review of Systems:  All other systems reviewed and apart from HPI, are negative.  Past  Medical History:  Diagnosis Date  . Allergy   . Diabetes mellitus without complication ([place])   . Elevated cholesterol   . Hernia    bi lateral hernia repair  . History of kidney stones   . Hypertension   . Sleep apnea 2014   severe  . Umbilical hernia without obstruction and without gangrene 09/03/2017    Past Surgical History:  Procedure Laterality Date  . CARPAL TUNNEL RELEASE Right 2007  . CARPAL TUNNEL RELEASE Left 10/28/2014   Procedure: LEFT CARPAL TUNNEL RELEASE;  Surgeon: Charlotte Crumb, MD;  Location: [facility];  Service: Orthopedics;  Laterality: Left;  . FOOT SURGERY Left   . INGUINAL HERNIA REPAIR     times two, each side  . KIDNEY STONE SURGERY    . MASS EXCISION Left 10/28/2014   Procedure: LEFT WRIST VOLAR MASS EXCISION;  Surgeon: Charlotte Crumb, MD;  Location: [facility];  Service: Orthopedics;  Laterality: Left;  . UMBILICAL HERNIA REPAIR N/A 10/27/2017   Procedure: HERNIA REPAIR UMBILICAL ADULT;  Surgeon: Clovis Riley, MD;  Location: [facility];  Service: General;  Laterality: N/A;     reports that he has never smoked. He has never used smokeless tobacco. He reports that he does not drink alcohol or use drugs.  Allergies  Allergen Reactions  . Doxycycline Hives  . Erythromycin Base Hives    Family History  Problem Relation Age of Onset  . Diabetes Mother   . Stroke Mother        4 strokes  .  Heart attack Mother   . Heart disease Mother   . Hypertension Father   . Hypertension Sister   . Heart disease Sister        pacemaker     Prior to Admission medications   Medication Sig Start Date End Date Taking? Authorizing Provider  atorvastatin (LIPITOR) 20 MG tablet Take 1 tablet (20 mg total) by mouth daily. 09/27/19   Georgina Quint, MD  dapagliflozin propanediol (FARXIGA) 5 MG TABS tablet Take 5 mg by mouth daily before breakfast. Patient not taking: Reported on 12/29/2019 09/27/19   Georgina Quint, MD   diclofenac (VOLTAREN) 75 MG EC tablet TAKE 1 TABLET BY MOUTH TWICE A DAY Patient not taking: Reported on 12/29/2019 05/24/19   Lenn Sink, DPM  diclofenac (VOLTAREN) 75 MG EC tablet Take 1 tablet (75 mg total) by mouth 2 (two) times daily. Patient not taking: Reported on 12/29/2019 05/24/19   Lenn Sink, DPM  Dulaglutide (TRULICITY) 0.75 MG/0.5ML SOPN Inject 0.75 mg into the skin once a week. 12/29/19   Georgina Quint, MD  fluticasone [facility]) 50 MCG/ACT nasal spray SPRAY 2 SPRAYS INTO EACH NOSTRIL EVERY DAY Patient not taking: Reported on 12/29/2019 09/14/19   Georgina Quint, MD  lisinopril (ZESTRIL) 20 MG tablet Take 1 tablet (20 mg total) by mouth daily. 09/27/19 12/26/19  Georgina Quint, MD  meloxicam (MOBIC) 15 MG tablet Take 15 mg by mouth daily as needed. 01/15/18   [provider]  metFORMIN (GLUCOPHAGE) 1000 MG tablet Take 1 tablet (1,000 mg total) by mouth 2 (two) times daily with a meal. 09/27/19   Sagardia, Eilleen Kempf, MD  neomycin-polymyxin-hydrocortisone (CORTISPORIN) OTIC solution Apply 1-2 drops to toe after soaking twice a day Patient not taking: Reported on 12/29/2019 02/15/19   Lenn Sink, DPM    Physical Exam: Vitals:   04/01/20 0345 04/01/20 0400 04/01/20 0430 04/01/20 0445  BP: 125/88 (!) 135/94 137/85 (!) 140/96  Pulse: 72 78 73 79  Resp: 15 14 14 12   Temp:      TempSrc:      SpO2: 91% 98% 92% 99%  Weight:      Height:        Constitutional: NAD, calm  Eyes: PERTLA, lids and conjunctivae normal ENMT: Mucous membranes are moist. Posterior pharynx clear of any exudate or lesions.   Neck: normal, supple, no masses, no thyromegaly Respiratory:  no wheezing, no crackles. No accessory muscle use.  Cardiovascular: S1 & S2 heard, regular rate and rhythm. No extremity edema.   Abdomen: No distension, no tenderness, soft. Bowel sounds active.  Musculoskeletal: no clubbing / cyanosis. No joint deformity upper and lower extremities.     Skin: no significant rashes, lesions, ulcers. Warm, dry, well-perfused. Neurologic: No facial asymmetry. Sensation intact. Moving all extremities.  Psychiatric: Alert and oriented to person, place, and situation. Calm and cooperative.    Labs and Imaging on Admission: I have personally reviewed following labs and imaging studies  CBC: Recent Labs  Lab 03/31/20 2115  WBC 9.3  HGB 13.4  HCT 44.5  MCV 78.3*  PLT 315   Basic Metabolic Panel: Recent Labs  Lab 03/31/20 2115  NA 141  K 4.1  CL 105  CO2 26  GLUCOSE 104*  BUN 11  CREATININE 0.97  CALCIUM 9.5   GFR: Estimated Creatinine Clearance: 111.9 mL/min (by C-G formula based on SCr of 0.97 mg/dL). Liver Function Tests: No results for input(s): AST, ALT, ALKPHOS, BILITOT, PROT, ALBUMIN  in the last 168 hours. No results for input(s): LIPASE, AMYLASE in the last 168 hours. No results for input(s): AMMONIA in the last 168 hours. Coagulation Profile: No results for input(s): INR, PROTIME in the last 168 hours. Cardiac Enzymes: No results for input(s): CKTOTAL, CKMB, CKMBINDEX, TROPONINI in the last 168 hours. BNP (last 3 results) No results for input(s): PROBNP in the last 8760 hours. HbA1C: No results for input(s): HGBA1C in the last 72 hours. CBG: Recent Labs  Lab 03/31/20 2335  GLUCAP 100*   Lipid Profile: No results for input(s): CHOL, HDL, LDLCALC, TRIG, CHOLHDL, LDLDIRECT in the last 72 hours. Thyroid Function Tests: No results for input(s): TSH, T4TOTAL, FREET4, T3FREE, THYROIDAB in the last 72 hours. Anemia Panel: No results for input(s): VITAMINB12, FOLATE, FERRITIN, TIBC, IRON, RETICCTPCT in the last 72 hours. Urine analysis:    Component Value Date/Time   COLORURINE YELLOW 03/07/2011 2116   APPEARANCEUR CLEAR 03/07/2011 2116   LABSPEC 1.020 10/16/2012 1903   PHURINE 5.5 10/16/2012 1903   GLUCOSEU NEGATIVE 10/16/2012 1903   HGBUR NEGATIVE 10/16/2012 1903   BILIRUBINUR neg 11/17/2013 1806    KETONESUR NEGATIVE 10/16/2012 1903   PROTEINUR neg 11/17/2013 1806   PROTEINUR NEGATIVE 10/16/2012 1903   UROBILINOGEN 0.2 11/17/2013 1806   UROBILINOGEN 0.2 10/16/2012 1903   NITRITE neg 11/17/2013 1806   NITRITE NEGATIVE 10/16/2012 1903   LEUKOCYTESUR Negative 11/17/2013 1806   Sepsis Labs: @LABRCNTIP (procalcitonin:4,lacticidven:4) )No results found for this or any previous visit (from the past 240 hour(s)).   Radiological Exams on Admission: DG Chest 2 View  Result Date: 03/31/2020 CLINICAL DATA:  Hypertension, tachycardia, shortness of breath EXAM: CHEST - 2 VIEW COMPARISON:  10/15/2016 FINDINGS: The heart size and mediastinal contours are within normal limits. Both lungs are clear. The visualized skeletal structures are unremarkable. IMPRESSION: No active cardiopulmonary disease. Electronically Signed   By: 10/17/2016 M.D.   On: 03/31/2020 21:50    EKG: Independently reviewed. Sinus tachycardia, rate 103, LAD.   Assessment/Plan   1. NSTEMI - Presents with chest discomfort, SOB, and diaphoresis that developed with exertion and began to ease off with rest, found to have no acute ischemic features on EKG and initial HS troponin 6, second troponin 26  - He was given ASA 324 mg in ED and started on IV heparin infusion  - Continue cardiac monitoring, trend troponin, continue IV heparin, continue ASA and statin, start beta-blocker   2. Hypertension  - BP 140/96 in ED, pharmacy medication-reconciliation pending, will start metoprolol for now given concern for non-STEMI    3. Type II DM  - A1c was 9.1% in January 2021  - Check CBGs and use a low-intensity SSI with Novolog for now    DVT prophylaxis: IV heparin  Code Status: Full  Family Communication: Discussed with patient  Disposition Plan:  Patient is from: Home  Anticipated d/c is to: Home  Anticipated d/c date is: 04/02/20  Patient currently: Requiring ongoing evaluation for suspected NSTEMI, cardiology consultation  pending   Consults called: Cardiology  Admission status: Observation     04/04/20, MD [facility] Pager: See [email or web address]  If 7AM-7PM, please contact the daytime attending [email or web address]  04/01/2020, 4:54 AM

## 2020-04-01 NOTE — ED Provider Notes (Addendum)
[facility] EMERGENCY DEPARTMENT Provider Note  CSN: [id number] Arrival date & time: 03/31/20 2040  Chief Complaint(s) Chest Pain  HPI Walter Reynolds. is a 49 y.o. male   The history is provided by the patient.  Chest Pain Pain location:  Substernal area Pain quality: crushing   Pain radiates to:  Does not radiate Pain severity:  Moderate Duration:  30 minutes Timing:  Constant Progression:  Resolved Chronicity:  New Relieved by:  Rest Associated symptoms: diaphoresis, nausea, palpitations and shortness of breath   Associated symptoms: no cough and no vomiting   Risk factors: diabetes mellitus, high cholesterol, hypertension and male sex     Past Medical History Past Medical History:  Diagnosis Date   Allergy    Diabetes mellitus without complication (HCC)    Elevated cholesterol    Hernia    bi lateral hernia repair   History of kidney stones    Hypertension    Sleep apnea 2014   severe   Umbilical hernia without obstruction and without gangrene 09/03/2017   Patient Active Problem List   Diagnosis Date Noted   Chest pain 04/01/2020   Hypertension associated with diabetes (HCC) 09/27/2019   Class 3 severe obesity with serious comorbidity and body mass index (BMI) of 40.0 to 44.9 in adult [facility]) 09/27/2019   Dyslipidemia 09/09/2018   Environmental allergies 09/09/2018   BMI 39.0-39.9,adult 09/03/2017   Diabetes mellitus type II, non insulin dependent (HCC) 09/03/2017   Anemia 04/14/2017   Hyperlipidemia 05/02/2016   OSA (obstructive sleep apnea) 09/06/2015   Benign essential HTN 09/06/2015   Home Medication(s) Prior to Admission medications   Medication Sig Start Date End Date Taking? Authorizing Provider  atorvastatin (LIPITOR) 20 MG tablet Take 1 tablet (20 mg total) by mouth daily. 09/27/19   Georgina Quint, MD  dapagliflozin propanediol (FARXIGA) 5 MG TABS tablet Take 5 mg by mouth daily before  breakfast. Patient not taking: Reported on 12/29/2019 09/27/19   Georgina Quint, MD  diclofenac (VOLTAREN) 75 MG EC tablet TAKE 1 TABLET BY MOUTH TWICE A DAY Patient not taking: Reported on 12/29/2019 05/24/19   Lenn Sink, DPM  diclofenac (VOLTAREN) 75 MG EC tablet Take 1 tablet (75 mg total) by mouth 2 (two) times daily. Patient not taking: Reported on 12/29/2019 05/24/19   Lenn Sink, DPM  Dulaglutide (TRULICITY) 0.75 MG/0.5ML SOPN Inject 0.75 mg into the skin once a week. 12/29/19   Georgina Quint, MD  fluticasone [facility]) 50 MCG/ACT nasal spray SPRAY 2 SPRAYS INTO EACH NOSTRIL EVERY DAY Patient not taking: Reported on 12/29/2019 09/14/19   Georgina Quint, MD  lisinopril (ZESTRIL) 20 MG tablet Take 1 tablet (20 mg total) by mouth daily. 09/27/19 12/26/19  Georgina Quint, MD  meloxicam (MOBIC) 15 MG tablet Take 15 mg by mouth daily as needed. 01/15/18   [provider]  metFORMIN (GLUCOPHAGE) 1000 MG tablet Take 1 tablet (1,000 mg total) by mouth 2 (two) times daily with a meal. 09/27/19   Sagardia, Eilleen Kempf, MD  neomycin-polymyxin-hydrocortisone (CORTISPORIN) OTIC solution Apply 1-2 drops to toe after soaking twice a day Patient not taking: Reported on 12/29/2019 02/15/19   Lenn Sink, DPM  Past Surgical History Past Surgical History:  Procedure Laterality Date   CARPAL TUNNEL RELEASE Right 2007   CARPAL TUNNEL RELEASE Left 10/28/2014   Procedure: LEFT CARPAL TUNNEL RELEASE;  Surgeon: Charlotte Crumb, MD;  Location: [place];  Service: Orthopedics;  Laterality: Left;   FOOT SURGERY Left    INGUINAL HERNIA REPAIR     times two, each side   KIDNEY STONE SURGERY     MASS EXCISION Left 10/28/2014   Procedure: LEFT WRIST VOLAR MASS EXCISION;  Surgeon: Charlotte Crumb, MD;  Location: [place];  Service: Orthopedics;  Laterality: Left;   UMBILICAL HERNIA REPAIR N/A 10/27/2017   Procedure: HERNIA REPAIR UMBILICAL ADULT;  Surgeon: Clovis Riley, MD;  Location: [facility] OR;  Service: General;  Laterality: N/A;   Family History Family History  Problem Relation Age of Onset   Diabetes Mother    Stroke Mother        4 strokes   Heart attack Mother    Heart disease Mother    Hypertension Father    Hypertension Sister    Heart disease Sister        pacemaker    Social History Social History   Tobacco Use   Smoking status: Never Smoker   Smokeless tobacco: Never Used  Substance Use Topics   Alcohol use: No    Alcohol/week: 0.0 standard drinks   Drug use: No   Allergies Doxycycline and Erythromycin base  Review of Systems Review of Systems  Constitutional: Positive for diaphoresis.  Respiratory: Positive for shortness of breath. Negative for cough.   Cardiovascular: Positive for chest pain and palpitations.  Gastrointestinal: Positive for nausea. Negative for vomiting.   All other systems are reviewed and are negative for acute change except as noted in the HPI  Physical Exam Vital Signs  I have reviewed the triage vital signs BP (!) 130/91 (BP Location: Left Arm)    Pulse 79    Temp 98.4 F (36.9 C) (Oral)    Resp 18    Ht 5\' 7"  (1.702 m)    Wt 117.9 kg    SpO2 98%    BMI 40.71 kg/m   Physical Exam Vitals reviewed.  Constitutional:      General: He is not in acute distress.    Appearance: He is well-developed. He is not diaphoretic.  HENT:     Head: Normocephalic and atraumatic.     Nose: Nose normal.  Eyes:     General: No scleral icterus.       Right eye: No discharge.        Left eye: No discharge.     Conjunctiva/sclera: Conjunctivae normal.     Pupils: Pupils are equal, round, and reactive to light.  Cardiovascular:     Rate and Rhythm: Normal rate and regular rhythm.     Heart sounds: No murmur. No friction rub. No  gallop.   Pulmonary:     Effort: Pulmonary effort is normal. No respiratory distress.     Breath sounds: Normal breath sounds. No stridor. No rales.  Abdominal:     General: There is no distension.     Palpations: Abdomen is soft.     Tenderness: There is no abdominal tenderness.  Musculoskeletal:        General: No tenderness.     Cervical back: Normal range of motion and neck supple.  Skin:    General: Skin is warm and dry.     Findings: No  erythema or rash.  Neurological:     Mental Status: He is alert and oriented to person, place, and time.     ED Results and Treatments Labs (all labs ordered are listed, but only abnormal results are displayed) Labs Reviewed  BASIC METABOLIC PANEL - Abnormal; Notable for the following components:      Result Value   Glucose, Bld 104 (*)    All other components within normal limits  CBC - Abnormal; Notable for the following components:   MCV 78.3 (*)    MCH 23.6 (*)    RDW 16.3 (*)    All other components within normal limits  CBG MONITORING, ED - Abnormal; Notable for the following components:   Glucose-Capillary 100 (*)    All other components within normal limits  TROPONIN I (HIGH SENSITIVITY) - Abnormal; Notable for the following components:   Troponin I (High Sensitivity) 26 (*)    All other components within normal limits  SARS CORONAVIRUS 2 (TAT 6-24 HRS)  TROPONIN I (HIGH SENSITIVITY)  TROPONIN I (HIGH SENSITIVITY)                                                                                                                         EKG  EKG Interpretation  Date/Time:  Friday March 31 2020 20:58:31 EDT Ventricular Rate:  103 PR Interval:  160 QRS Duration: 84 QT Interval:  354 QTC Calculation: 463 R Axis:   -30 Text Interpretation: Sinus tachycardia Left axis deviation Abnormal ECG No significant change since last tracing Confirmed by Drema Pry [phone]) on 04/01/2020 12:08:26 AM      Radiology DG Chest 2  View  Result Date: 03/31/2020 CLINICAL DATA:  Hypertension, tachycardia, shortness of breath EXAM: CHEST - 2 VIEW COMPARISON:  10/15/2016 FINDINGS: The heart size and mediastinal contours are within normal limits. Both lungs are clear. The visualized skeletal structures are unremarkable. IMPRESSION: No active cardiopulmonary disease. Electronically Signed   By: Sharlet Salina M.D.   On: 03/31/2020 21:50    Pertinent labs & imaging results that were available during my care of the patient were reviewed by me and considered in my medical decision making (see chart for details).  Medications Ordered in ED Medications  sodium chloride flush (NS) 0.9 % injection 3 mL (has no administration in time range)  aspirin chewable tablet 324 mg (has no administration in time range)  Procedures .Critical Care Performed by: Nira Conn, MD Authorized by: Nira Conn, MD    CRITICAL CARE Performed by: Amadeo Garnet Sula Fetterly Total critical care time: 30 minutes Critical care time was exclusive of separately billable procedures and treating other patients. Critical care was necessary to treat or prevent imminent or life-threatening deterioration. Critical care was time spent personally by me on the following activities: development of treatment plan with patient and/or surrogate as well as nursing, discussions with consultants, evaluation of patient's response to treatment, examination of patient, obtaining history from patient or surrogate, ordering and performing treatments and interventions, ordering and review of laboratory studies, ordering and review of radiographic studies, pulse oximetry and re-evaluation of patient's condition.    (including critical care time)  Medical Decision Making / ED Course I have reviewed the nursing notes for this  encounter and the patient's prior records (if available in EHR or on provided paperwork).   Jaelyn Cloninger. was evaluated in Emergency Department on 04/01/2020 for the symptoms described in the history of present illness. He was evaluated in the context of the global COVID-19 pandemic, which necessitated consideration that the patient might be at risk for infection with the SARS-CoV-2 virus that causes COVID-19. Institutional protocols and algorithms that pertain to the evaluation of patients at risk for COVID-19 are in a state of rapid change based on information released by regulatory bodies including the CDC and federal and state organizations. These policies and algorithms were followed during the patient's care in the ED.  Patient presents with substernal chest pain earlier today, chest pain-free currently.  EKG without acute ischemic changes or evidence of pericarditis.  Initial troponin was negative however repeat troponin trended up with a delta of 20.  Heart score of 5.  Patient will require admission for ACS rule out and troponin trending.  Low suspicion for pulmonary embolism.  Presentation not classic for aortic dissection or esophageal perforation.  Chest x-ray without evidence suggestive of pneumonia, pneumothorax, pneumomediastinum.  No abnormal contour of the mediastinum to suggest dissection. No evidence of acute injuries.  ASA given.   Spoke with Dr. Joneen Roach from hospitalist team who will admit the patient.  cardiology consulted. Hep gtt.     Final Clinical Impression(s) / ED Diagnoses Final diagnoses:  Substernal chest pain  Elevated troponin      This chart was dictated using voice recognition software.  Despite best efforts to proofread,  errors can occur which can change the documentation meaning.     Nira Conn, MD 04/01/20 [id number]

## 2020-04-01 NOTE — Consult Note (Signed)
Cardiology Consultation:   Patient ID: Walter Reynolds. MRN: [id number]; DOB: 1971/05/19  Admit date: 03/31/2020 Date of Consult: 04/01/2020  Primary Care Provider: Georgina Quint, MD Primary Cardiologist: Dr Armanda Magic Primary Electrophysiologist:  None    Patient Profile:   Walter Reynolds. is a 49 y.o. male with a hx of HTN, DM2, obesity, HLD, OSA who is being seen today for the evaluation of chest pain at the request of Dr Eudelia Bunch.  History of Present Illness:   Walter Reynolds describes sudden onset chest pain and palpitations starting yesterday evening. He was outside mowing his grass when about 5 to 10 minutes into mowing he developed chest pain. He felt like his Reynolds was beating very fast and very hard at the time. He continued mowing and his symptoms worsened. Eventually he had to stop before finishing his mowing. He sat down and over time his symptoms improved. He had some diaphoresis and shortness of breath at the time as well. He states he has never had anything like this before. He has no known history of cardiac disease.   Walter Reynolds came to the ED for evaluation. On arrival, his vital signs were unremarkable. His initial presenting ECG revealed no ST or T wave changes suggestive of acute ischemia. His initial troponin was 6, then was 26 approximately 3 hours later. He was given a full dose aspirin and admitted to the medicine service with cardiology consultation.   On my assessment, the patient is chest pain free. He states he has been chest pain free since arriving to the ED last night. He does not smoke cigarettes or use illicit drugs. He drinks alcohol infrequently. He has a significant family history for cardiac disease, with MI/stroke in his mother (35's), defibrillator in his sister, and multiple cardiac risk factors among other family members.   Reynolds Pathway Score:     Past Medical History:  Diagnosis Date  . Allergy   . Diabetes mellitus without  complication (HCC)   . Elevated cholesterol   . Hernia    bi lateral hernia repair  . History of kidney stones   . Hypertension   . Sleep apnea 2014   severe  . Umbilical hernia without obstruction and without gangrene 09/03/2017    Past Surgical History:  Procedure Laterality Date  . CARPAL TUNNEL RELEASE Right 2007  . CARPAL TUNNEL RELEASE Left 10/28/2014   Procedure: LEFT CARPAL TUNNEL RELEASE;  Surgeon: Dairl Ponder, MD;  Location: [facility];  Service: Orthopedics;  Laterality: Left;  . FOOT SURGERY Left   . INGUINAL HERNIA REPAIR     times two, each side  . KIDNEY STONE SURGERY    . MASS EXCISION Left 10/28/2014   Procedure: LEFT WRIST VOLAR MASS EXCISION;  Surgeon: Dairl Ponder, MD;  Location: [facility];  Service: Orthopedics;  Laterality: Left;  . UMBILICAL HERNIA REPAIR N/A 10/27/2017   Procedure: HERNIA REPAIR UMBILICAL ADULT;  Surgeon: Berna Bue, MD;  Location: [facility] OR;  Service: General;  Laterality: N/A;     Home Medications:  Prior to Admission medications   Medication Sig Start Date End Date Taking? Authorizing Provider  atorvastatin (LIPITOR) 20 MG tablet Take 1 tablet (20 mg total) by mouth daily. 09/27/19   Georgina Quint, MD  dapagliflozin propanediol (FARXIGA) 5 MG TABS tablet Take 5 mg by mouth daily before breakfast. Patient not taking: Reported on 12/29/2019 09/27/19   Georgina Quint, MD  diclofenac (VOLTAREN) 75 MG EC  tablet TAKE 1 TABLET BY MOUTH TWICE A DAY Patient not taking: Reported on 12/29/2019 05/24/19   Wallene Huh, DPM  diclofenac (VOLTAREN) 75 MG EC tablet Take 1 tablet (75 mg total) by mouth 2 (two) times daily. Patient not taking: Reported on 12/29/2019 05/24/19   Wallene Huh, DPM  Dulaglutide (TRULICITY) 0.86 VH/8.4ON SOPN Inject 0.75 mg into the skin once a week. 12/29/19   Horald Pollen, MD  fluticasone [facility]) 50 MCG/ACT nasal spray SPRAY 2 SPRAYS INTO EACH NOSTRIL EVERY  DAY Patient not taking: Reported on 12/29/2019 09/14/19   Horald Pollen, MD  lisinopril (ZESTRIL) 20 MG tablet Take 1 tablet (20 mg total) by mouth daily. 09/27/19 12/26/19  Horald Pollen, MD  meloxicam (MOBIC) 15 MG tablet Take 15 mg by mouth daily as needed. 01/15/18   [provider]  metFORMIN (GLUCOPHAGE) 1000 MG tablet Take 1 tablet (1,000 mg total) by mouth 2 (two) times daily with a meal. 09/27/19   Sagardia, Ines Bloomer, MD  neomycin-polymyxin-hydrocortisone (CORTISPORIN) OTIC solution Apply 1-2 drops to toe after soaking twice a day Patient not taking: Reported on 12/29/2019 02/15/19   Wallene Huh, DPM    Inpatient Medications: Scheduled Meds: . aspirin  324 mg Oral Once  . sodium chloride flush  3 mL Intravenous Once   Continuous Infusions:  PRN Meds:   Allergies:    Allergies  Allergen Reactions  . Doxycycline Hives  . Erythromycin Base Hives    Social History:   Social History   Socioeconomic History  . Marital status: Married    Spouse name: BELDON NOWLING  . Number of children: 2  . Years of education: Associates  . Highest education level: Not on file  Occupational History  . Occupation: Librarian, academic    Comment: [employer]  Tobacco Use  . Smoking status: Never Smoker  . Smokeless tobacco: Never Used  Substance and Sexual Activity  . Alcohol use: No    Alcohol/week: 0.0 standard drinks  . Drug use: No  . Sexual activity: Yes    Partners: Female    Birth control/protection: Condom  Other Topics Concern  . Not on file  Social History Narrative   Lives with his wife and their 2 children.   He has 3 sons from a previous relationship that live independently.   Formerly in the [employer].   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   Social Determinants of Health   Financial Resource Strain:   . Difficulty of Paying Living Expenses:   Food  Insecurity:   . Worried About Charity fundraiser in the Last Year:   . Arboriculturist in the Last Year:   Transportation Needs:   . Film/video editor (Medical):   Marland Kitchen Lack of Transportation (Non-Medical):   Physical Activity:   . Days of Exercise per Week:   . Minutes of Exercise per Session:   Stress:   . Feeling of Stress :   Social Connections:   . Frequency of Communication with Friends and Family:   . Frequency of Social Gatherings with Friends and Family:   . Attends Religious Services:   . Active Member of Clubs or Organizations:   . Attends Archivist Meetings:   Marland Kitchen Marital Status:   Intimate Partner Violence:   . Fear of Current or Ex-Partner:   . Emotionally Abused:   Marland Kitchen Physically Abused:   .  Sexually Abused:     Family History:    Family History  Problem Relation Age of Onset  . Diabetes Mother   . Stroke Mother        4 strokes  . Reynolds attack Mother   . Reynolds disease Mother   . Hypertension Father   . Hypertension Sister   . Reynolds disease Sister        pacemaker     ROS:  Please see the history of present illness.   All other ROS reviewed and negative.     Physical Exam/Data:   Vitals:   03/31/20 2105 03/31/20 2107 04/01/20 0239  BP: (!) 142/97  (!) 130/91  Pulse: (!) 101  79  Resp: 20  18  Temp: 98.6 F (37 C)  98.4 F (36.9 C)  TempSrc: Oral  Oral  SpO2: 99%  98%  Weight:  117.9 kg   Height:  5\' 7"  (1.702 m)    No intake or output data in the 24 hours ending 04/01/20 0446 Last 3 Weights 03/31/2020 12/29/2019 09/27/2019  Weight (lbs) 259 lb 14.8 oz 260 lb 259 lb 3.2 oz  Weight (kg) 117.9 kg 117.935 kg 117.572 kg     Body mass index is 40.71 kg/m.  General:  Well nourished, well developed, in no acute distress HEENT: normal Lymph: no adenopathy Neck: no JVD Endocrine:  No thryomegaly Vascular: No carotid bruits; FA pulses 2+ bilaterally without bruits  Cardiac:  normal S1, S2; RRR; no murmur  Lungs:  clear to  auscultation bilaterally, no wheezing, rhonchi or rales  Abd: soft, nontender, no hepatomegaly  Ext: no edema Musculoskeletal:  No deformities, BUE and BLE strength normal and equal Skin: warm and dry  Neuro:  CNs 2-12 intact, no focal abnormalities noted Psych:  Normal affect   EKG:  The EKG was personally reviewed and demonstrates:  Sinus tachycardia, HR 103, borderline left axis deviation, no Q waves, no ST or T wave changes to suggest acute ischemia   Relevant CV Studies: 2016 Exercise SPECT   Nuclear stress EF: 47%.  There was no ST segment deviation noted during stress.  The left ventricular ejection fraction is mildly decreased (45-54%).  This is a low risk study.   Good exercise tolerance. Exercised for 10 minutes. No chest pain. No significant EKG changes with exercise. No ischemia by perfusion. Mild LV systolic dysfunction EF 47%.  Laboratory Data:  High Sensitivity Troponin:   Recent Labs  Lab 03/31/20 2115 03/31/20 2354  TROPONINIHS 6 26*     Chemistry Recent Labs  Lab 03/31/20 2115  NA 141  K 4.1  CL 105  CO2 26  GLUCOSE 104*  BUN 11  CREATININE 0.97  CALCIUM 9.5  GFRNONAA >60  GFRAA >60  ANIONGAP 10    No results for input(s): PROT, ALBUMIN, AST, ALT, ALKPHOS, BILITOT in the last 168 hours. Hematology Recent Labs  Lab 03/31/20 2115  WBC 9.3  RBC 5.68  HGB 13.4  HCT 44.5  MCV 78.3*  MCH 23.6*  MCHC 30.1  RDW 16.3*  PLT 315   BNPNo results for input(s): BNP, PROBNP in the last 168 hours.  DDimer No results for input(s): DDIMER in the last 168 hours.   Radiology/Studies:  DG Chest 2 View  Result Date: 03/31/2020 CLINICAL DATA:  Hypertension, tachycardia, shortness of breath EXAM: CHEST - 2 VIEW COMPARISON:  10/15/2016 FINDINGS: The Reynolds size and mediastinal contours are within normal limits. Both lungs are clear. The visualized skeletal structures  are unremarkable. IMPRESSION: No active cardiopulmonary disease. Electronically Signed    By: Sharlet Salina M.D.   On: 03/31/2020 21:50       TIMI Risk Score for Unstable Angina or Non-ST Elevation MI:   The patient's TIMI risk score is 2, which indicates a 8% risk of all cause mortality, new or recurrent myocardial infarction or need for urgent revascularization in the next 14 days.   Assessment and Plan:  Walter Reynolds. is a 49 y.o. male with a hx of HTN, DM2, obesity, HLD, OSA who presents with chest pain and is found to have uptrending troponin. Patient's story concerning for ACS, although troponin minimally elevated (rule in by 3 hour delta). Alternatively, the patient's description of palpitations suggests possible arrhythmia such as AF driving his chest pain and minimally elevated troponin. He has multiple risk factors for AF including obesity and OSA. Given his multiple coronary risk factors and significant family history of CAD, it would be reasonable to proceed with invasive assessment at this time.    #) Chest pain, elevated troponin, palpitations - echo in AM - monitor on telemetry - check lipids, A1c - ASA 324mg  then 81mg  daily - start heparin drip for ACS per pharmacy protocol - increase atorvastatin to 80mg  QHS - start metoprolol succinate 25mg  daily - SLN, nitro gtt PRN - will reassess in AM regarding timing of coronary assessment - may consider cardiac rhythm monitor to evaluate for paroxysmal AF  #) Diabetes - continue metformin, dulaglutide, dapagliflozin  #) HTN - cont home lisinopril      For questions or updates, please contact [facility] Please consult [email or web address] for contact info under     Signed, , MD  04/01/2020 4:46 AM

## 2020-04-01 NOTE — ED Notes (Signed)
Admitting doctor paged in regards to meds for DMII. Pt states he takes metformin at home, per Dr. Francesco Runner note, patient is to have novolog while in hospital, however, no orders for novolog present in chart.

## 2020-04-01 NOTE — Progress Notes (Signed)
ANTICOAGULATION CONSULT NOTE  Pharmacy Consult for Heparin Indication: chest pain/ACS  Allergies  Allergen Reactions  . Doxycycline Hives  . Erythromycin Base Hives    Patient Measurements: Height: 5\' 7"  (170.2 cm) Weight: 117.9 kg (259 lb 14.8 oz) IBW/kg (Calculated) : 66.1 Heparin Dosing Weight: 96 kg  Vital Signs: Temp: 98.4 F (36.9 C) (04/17 0239) Temp Source: Oral (04/17 0239) BP: 120/84 (04/17 1216) Pulse Rate: 71 (04/17 1216)  Labs: Recent Labs    03/31/20 2115 03/31/20 2115 03/31/20 2354 04/01/20 0542 04/01/20 0649 04/01/20 1052  HGB 13.4  --   --   --   --   --   HCT 44.5  --   --   --   --   --   PLT 315  --   --   --   --   --   HEPARINUNFRC  --   --   --   --   --  0.37  CREATININE 0.97  --   --   --   --   --   TROPONINIHS 6   < > 26* 18* 16  --    < > = values in this interval not displayed.    Estimated Creatinine Clearance: 111.9 mL/min (by C-G formula based on SCr of 0.97 mg/dL).   Medical History: Past Medical History:  Diagnosis Date  . Allergy   . Diabetes mellitus without complication (HCC)   . Elevated cholesterol   . Hernia    bi lateral hernia repair  . History of kidney stones   . Hypertension   . Sleep apnea 2014   severe  . Umbilical hernia without obstruction and without gangrene 09/03/2017    Medications:  Awaiting electronic med rec  Assessment: 49 y.o. M presents with CP. To begin heparin for r/o ACS. No AC PTA. CBC ok on admission.  Initial heparin level therapeutic on 1350 units/hr, no bleeding noted.  Goal of Therapy:  Heparin level 0.3-0.7 units/ml Monitor platelets by anticoagulation protocol: Yes   Plan:  Continue heparin gtt at 1350 units/hr F/u 6 hour heparin level F/u cards plan for possible cath  44, PharmD Clinical Pharmacist ED Pharmacist Phone # [phone] 04/01/2020 12:19 PM

## 2020-04-01 NOTE — ED Notes (Signed)
Cards at bedside

## 2020-04-01 NOTE — ED Notes (Signed)
Attempted to call report. Nurse at lunch, will call me back.

## 2020-04-01 NOTE — Progress Notes (Signed)
ANTICOAGULATION CONSULT NOTE - Initial Consult  Pharmacy Consult for Heparin Indication: chest pain/ACS  Allergies  Allergen Reactions  . Doxycycline Hives  . Erythromycin Base Hives    Patient Measurements: Height: 5\' 7"  (170.2 cm) Weight: 117.9 kg (259 lb 14.8 oz) IBW/kg (Calculated) : 66.1 Heparin Dosing Weight: 96 kg  Vital Signs: Temp: 98.4 F (36.9 C) (04/17 0239) Temp Source: Oral (04/17 0239) BP: 130/91 (04/17 0239) Pulse Rate: 79 (04/17 0239)  Labs: Recent Labs    03/31/20 2115 03/31/20 2354  HGB 13.4  --   HCT 44.5  --   PLT 315  --   CREATININE 0.97  --   TROPONINIHS 6 26*    Estimated Creatinine Clearance: 111.9 mL/min (by C-G formula based on SCr of 0.97 mg/dL).   Medical History: Past Medical History:  Diagnosis Date  . Allergy   . Diabetes mellitus without complication (HCC)   . Elevated cholesterol   . Hernia    bi lateral hernia repair  . History of kidney stones   . Hypertension   . Sleep apnea 2014   severe  . Umbilical hernia without obstruction and without gangrene 09/03/2017    Medications:  Awaiting electronic med rec  Assessment: 49 y.o. M presents with CP. To begin heparin for r/o ACS. No AC PTA. CBC ok on admission.  Goal of Therapy:  Heparin level 0.3-0.7 units/ml Monitor platelets by anticoagulation protocol: Yes   Plan:  Heparin IV bolus 4000 units Heparin gtt at 1350 units/hr Will f/u heparin level in 6 hours Daily heparin level and CBC  44, PharmD, BCPS Please see amion for complete clinical pharmacist phone list 04/01/2020,4:45 AM

## 2020-04-01 NOTE — Progress Notes (Addendum)
Patient remains chest pain free.  Mild troponin elevation (26).  Discussed with patient.  He does not wish to remain in hospital for cath on Monday.  Recommended that we check a coronary CTA today, and if no significant obstruction, could discharge with outpatient follow-up.  If evidence of obstructive CAD on coronary CTA, would plan for cath Monday.  He was agreeable to this plan.    Addendum: Coronary CTA shows nonobstructive CAD.  Calcium score 110 (95th percentile).  Recommend increasing atorvastatin to 40 mg daily.  Episode may have been precipitated by tachyarrhythmia, as reported palpitations preceding chest pain.  Recommend outpatient f/u for monitor.

## 2020-04-02 LAB — TROPONIN I (HIGH SENSITIVITY): Troponin I (High Sensitivity): 26 ng/L — ABNORMAL HIGH (ref <18)

## 2020-04-03 ENCOUNTER — Ambulatory Visit: Payer: BC Managed Care – PPO | Admitting: Emergency Medicine

## 2020-04-10 ENCOUNTER — Inpatient Hospital Stay: Payer: BC Managed Care – PPO | Admitting: Emergency Medicine

## 2020-04-10 ENCOUNTER — Ambulatory Visit: Payer: BC Managed Care – PPO | Admitting: Emergency Medicine

## 2020-04-10 NOTE — Progress Notes (Signed)
Cardiology Office Note:    Date:  04/11/2020   ID:  Walter Males., DOB [birth year], MRN [id number]  PCP:  Georgina Quint, MD  Cardiologist:  No primary care provider on file.  Electrophysiologist:  None   Referring MD: Georgina Quint, *   Chief Complaint  Patient presents with  . Follow-up    Post CTA.  Marland Kitchen Chest Pain    History of Present Illness:    Walter Tremaine. is a 49 y.o. male with a hx of hypertension, type 2 diabetes, obesity, hyperlipidemia, OSA who presents as a hospital follow-up for chest pain.  He presented to the ED on 03/31/2020 with chest pain.  Reports that he was mowing his grass that day when he developed chest pain.  States that he felt his heart was racing and as he continued moving his symptoms worsen.  He eventually had to stop and his symptoms resolved over time.  On presentation to the ED, EKG revealed no ST/T wave abnormalities.  High-sensitivity troponin was 6 > 26 >18.  Coronary CTA was done on 04/01/2020 which showed calcium score 110 (95th percentile), nonobstructive CAD with mixed plaque in the proximal RCA causing mild (25 to 49%) stenosis.  5 mm pulmonary nodule also seen.  Since ED visit, he reports that he is doing well.  He denies any further chest pain since his ED visit.  No further palpitations.  Denies any lightheadedness or syncope.  Does note some lower extremity edema at the end of each day.  Reports that he smoked cigars for 5 years, but quit over 20 years ago.  Family history includes sister had likely postpartum cardiomyopathy, had an ICD placed.  Mother had CVA and MI in 50s.   Past Medical History:  Diagnosis Date  . Allergy   . Diabetes mellitus without complication (HCC)   . Elevated cholesterol   . Hernia    bi lateral hernia repair  . History of kidney stones   . Hypertension   . Sleep apnea 2014   severe  . Umbilical hernia without obstruction and without gangrene 09/03/2017    Past Surgical History:    Procedure Laterality Date  . CARPAL TUNNEL RELEASE Right 2007  . CARPAL TUNNEL RELEASE Left 10/28/2014   Procedure: LEFT CARPAL TUNNEL RELEASE;  Surgeon: Dairl Ponder, MD;  Location: [facility];  Service: Orthopedics;  Laterality: Left;  . FOOT SURGERY Left   . INGUINAL HERNIA REPAIR     times two, each side  . KIDNEY STONE SURGERY    . MASS EXCISION Left 10/28/2014   Procedure: LEFT WRIST VOLAR MASS EXCISION;  Surgeon: Dairl Ponder, MD;  Location: [facility];  Service: Orthopedics;  Laterality: Left;  . UMBILICAL HERNIA REPAIR N/A 10/27/2017   Procedure: HERNIA REPAIR UMBILICAL ADULT;  Surgeon: Berna Bue, MD;  Location: [facility];  Service: General;  Laterality: N/A;    Current Medications: No outpatient medications have been marked as taking for the 04/11/20 encounter (Office Visit) with Little Ishikawa, MD.     Allergies:   Doxycycline and Erythromycin base   Social History   Socioeconomic History  . Marital status: Married    Spouse name: Walter Reynolds  . Number of children: 2  . Years of education: Associates  . Highest education level: Not on file  Occupational History  . Occupation: Merchandiser, retail    Comment: [employer]  Tobacco Use  . Smoking status: Never Smoker  . Smokeless tobacco:  Never Used  Substance and Sexual Activity  . Alcohol use: No    Alcohol/week: 0.0 standard drinks  . Drug use: No  . Sexual activity: Yes    Partners: Female    Birth control/protection: Condom  Other Topics Concern  . Not on file  Social History Narrative   Lives with his wife and their 2 children.   He has 3 sons from a previous relationship that live independently.   Formerly in the [employer].   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   Social Determinants of Health   Financial Resource Strain:   . Difficulty of Paying Living Expenses:    Food Insecurity:   . Worried About Programme researcher, broadcasting/film/video in the Last Year:   . Barista in the Last Year:   Transportation Needs:   . Freight forwarder (Medical):   Marland Kitchen Lack of Transportation (Non-Medical):   Physical Activity:   . Days of Exercise per Week:   . Minutes of Exercise per Session:   Stress:   . Feeling of Stress :   Social Connections:   . Frequency of Communication with Friends and Family:   . Frequency of Social Gatherings with Friends and Family:   . Attends Religious Services:   . Active Member of Clubs or Organizations:   . Attends Banker Meetings:   Marland Kitchen Marital Status:      Family History: The patient's family history includes Diabetes in his mother; Heart attack in his mother; Heart disease in his mother and sister; Hypertension in his father and sister; Stroke in his mother.  ROS:   Please see the history of present illness.     All other systems reviewed and are negative.  EKGs/Labs/Other Studies Reviewed:    The following studies were reviewed today:   EKG:  EKG is not ordered today.   Coronary CTA 04/01/20: 1. Coronary calcium score of 110. This was 95th percentile for age and sex matched control.  2. Normal coronary origin with right dominance.  3. Nonobstructive CAD with mixed plaque in the proximal RCA causing mild (25-49%) stenosis  CAD-RADS 1. Minimal non-obstructive CAD (0-24%). Consider non-atherosclerotic causes of chest pain. Consider preventive therapy and risk factor modification.  1. No acute abnormality. 2. Small peripheral densities along the lower lobes are nonspecific and likely related to atelectasis. 5 mm peripheral nodule in the left lower lobe is indeterminate. No follow-up needed if patient is low-risk. Non-contrast chest CT can be considered in 12 months if patient is high-risk. This recommendation follows the consensus statement: Guidelines for Management of Incidental Pulmonary  Nodules Detected on CT Images: From the Fleischner Society 2017; Radiology 2017; 284:228-243. 3. Visualized liver has low-density and suspicious for hepatic steatosis.   Recent Labs: 12/29/2019: ALT 36 03/31/2020: BUN 11; Creatinine, Ser 0.97; Hemoglobin 13.4; Platelets 315; Potassium 4.1; Sodium 141 04/01/2020: Magnesium 1.7; TSH 1.391  Recent Lipid Panel    Component Value Date/Time   CHOL 138 12/29/2019 0946   TRIG 214 (H) 12/29/2019 0946   HDL 28 (L) 12/29/2019 0946   CHOLHDL 4.9 12/29/2019 0946   CHOLHDL 4.8 05/02/2016 0855   VLDL 17 05/02/2016 0855   LDLCALC 74 12/29/2019 0946    Physical Exam:    VS:  BP 124/78 (BP Location: Left Arm, Patient Position: Sitting, Cuff Size: Large)   Pulse 80   Temp (!) 97.1 F (36.2 C)  Ht 5\' 7"  (1.702 m)   Wt 257 lb (116.6 kg)   BMI 40.25 kg/m     Wt Readings from Last 3 Encounters:  04/11/20 257 lb (116.6 kg)  03/31/20 259 lb 14.8 oz (117.9 kg)  12/29/19 260 lb (117.9 kg)     GEN:   in no acute distress HEENT: Normal NECK: No JVD CARDIAC: RRR, no murmurs, rubs, gallops RESPIRATORY:  Clear to auscultation without rales, wheezing or rhonchi  ABDOMEN: Soft, non-tender, non-distended MUSCULOSKELETAL:  No edema; No deformity  SKIN: Warm and dry NEUROLOGIC:  Alert and oriented x 3 PSYCHIATRIC:  Normal affect   ASSESSMENT:    1. Chest pain of uncertain etiology   2. Palpitations   3. Lung nodule   4. Essential hypertension   5. Hyperlipidemia, unspecified hyperlipidemia type    PLAN:    CAD: Presented with chest pain to ED on 03/31/2020, coronary CTA showed  calcium score 110 (95th percentile), nonobstructive CAD with mixed plaque in the proximal RCA causing mild (25 to 49%) stenosis. -Continue atorvastatin 40 mg daily -Check TTE  Palpitations: Episode of chest pain precipitated by feeling that heart is racing.  Will check Zio patch times 2 weeks  Hypertension: Continue lisinopril 20 mg daily.  Appears  controlled  Pulmonary nodule: 5 mm nodule noted on recent coronary CTA.  Given smoking history, will check repeat CT chest in 1 year  Type 2 diabetes: On Metformin.  A1c 7.7 on 04/01/2020  Hyperlipidemia: LDL 74 on 12/29/2019.  On atorvastatin 40 mg daily  RTC in 3 months  Medication Adjustments/Labs and Tests Ordered: Current medicines are reviewed at length with the patient today.  Concerns regarding medicines are outlined above.  Orders Placed This Encounter  Procedures  . CT Chest Wo Contrast  . LONG TERM MONITOR (3-14 DAYS)  . ECHOCARDIOGRAM COMPLETE   No orders of the defined types were placed in this encounter.   Patient Instructions  Medication Instructions:  Your physician recommends that you continue on your current medications as directed. Please refer to the Current Medication list given to you today.  *If you need a refill on your cardiac medications before your next appointment, please call your pharmacy*   Testing/Procedures: Your physician has requested that you have an echocardiogram. Echocardiography is a painless test that uses sound waves to create images of your heart. It provides your doctor with information about the size and shape of your heart and how well your heart's chambers and valves are working. This procedure takes approximately one hour. There are no restrictions for this procedure. This will be done at our [facility] location:  [address]   ZIO XT- Long Term Monitor Instructions   Your physician has requested you wear your ZIO patch monitor for 14 days.   This is a single patch monitor.  Irhythm supplies one patch monitor per enrollment.  Additional stickers are not available.   Please do not apply patch if you will be having a Nuclear Stress Test, Echocardiogram, Cardiac CT, MRI, or Chest Xray during the time frame you would be wearing the monitor. The patch cannot be worn during these tests.  You cannot remove and  re-apply the ZIO XT patch monitor.   Your ZIO patch monitor will be sent USPS Priority mail from [facility] directly to your home address. The monitor may also be mailed to a PO BOX if home delivery is not available.   It may take 3-5 days to  receive your monitor after you have been enrolled.   Once you have received you monitor, please review enclosed instructions.  Your monitor has already been registered assigning a specific monitor serial # to you.   Applying the monitor   Shave hair from upper left chest.   Hold abrader disc by orange tab.  Rub abrader in 40 strokes over left upper chest as indicated in your monitor instructions.   Clean area with 4 enclosed alcohol pads .  Use all pads to assure are is cleaned thoroughly.  Let dry.   Apply patch as indicated in monitor instructions.  Patch will be place under collarbone on left side of chest with arrow pointing upward.   Rub patch adhesive wings for 2 minutes.Remove white label marked "1".  Remove white label marked "2".  Rub patch adhesive wings for 2 additional minutes.   While looking in a mirror, press and release button in center of patch.  A small green light will flash 3-4 times .  This will be your only indicator the monitor has been turned on.     Do not shower for the first 24 hours.  You may shower after the first 24 hours.   Press button if you feel a symptom. You will hear a small click.  Record Date, Time and Symptom in the Patient Log Book.   When you are ready to remove patch, follow instructions on last 2 pages of Patient Log Book.  Stick patch monitor onto last page of Patient Log Book.   Place Patient Log Book in [place] box.  Use locking tab on box and tape box closed securely.  The Orange and [employer] has [employer] on it.  Please place in mailbox as soon as possible.  Your physician should have your test results approximately 7 days after the monitor has been mailed back to [facility].   Call [place] at [phone] if you have questions regarding your ZIO XT patch monitor.  Call them immediately if you see an orange light blinking on your monitor.   If your monitor falls off in less than 4 days contact our Monitor department at [phone].  If your monitor becomes loose or falls off after 4 days call [employer] at [phone] for suggestions on securing your monitor.   Non-Cardiac CT scanning, (CAT scanning), is a noninvasive, special x-ray that produces cross-sectional images of the body using x-rays and a computer. CT scans help physicians diagnose and treat medical conditions. For some CT exams, a contrast material is used to enhance visibility in the area of the body being studied. CT scans provide greater clarity and reveal more details than regular x-ray exams. --CT chest w/o contrast in 1 YEAR     Follow-Up: At [facility], you and your health needs are our priority.  As part of our continuing mission to provide you with exceptional heart care, we have created designated Provider Care Teams.  These Care Teams include your primary Cardiologist (physician) and Advanced Practice Providers (APPs -  Physician Assistants and Nurse Practitioners) who all work together to provide you with the care you need, when you need it.  We recommend signing up for the patient portal called "MyChart".  Sign up information is provided on this After Visit Summary.  MyChart is used to connect with patients for Virtual Visits (Telemedicine).  Patients are able to view lab/test results, encounter notes, upcoming appointments, etc.  Non-urgent messages can be sent to your provider  as well.   To learn more about what you can do with MyChart, go to [email or web address].    Your next appointment:   3 month(s)  The format for your next appointment:   In Person  Provider:   Epifanio Lescheshristopher Riyad Keena, MD        Signed, Little Ishikawahristopher L Tivis Wherry, MD  04/11/2020 12:35 PM     [facility]

## 2020-04-11 ENCOUNTER — Encounter: Payer: Self-pay | Admitting: *Deleted

## 2020-04-11 ENCOUNTER — Ambulatory Visit: Payer: BC Managed Care – PPO | Admitting: Cardiology

## 2020-04-11 ENCOUNTER — Other Ambulatory Visit: Payer: Self-pay

## 2020-04-11 ENCOUNTER — Encounter: Payer: Self-pay | Admitting: Cardiology

## 2020-04-11 VITALS — BP 124/78 | HR 80 | Temp 97.1°F | Ht 67.0 in | Wt 257.0 lb

## 2020-04-11 DIAGNOSIS — R079 Chest pain, unspecified: Secondary | ICD-10-CM | POA: Diagnosis not present

## 2020-04-11 DIAGNOSIS — E785 Hyperlipidemia, unspecified: Secondary | ICD-10-CM

## 2020-04-11 DIAGNOSIS — R002 Palpitations: Secondary | ICD-10-CM

## 2020-04-11 DIAGNOSIS — R911 Solitary pulmonary nodule: Secondary | ICD-10-CM | POA: Diagnosis not present

## 2020-04-11 DIAGNOSIS — I10 Essential (primary) hypertension: Secondary | ICD-10-CM | POA: Diagnosis not present

## 2020-04-11 NOTE — Patient Instructions (Signed)
Medication Instructions:  Your physician recommends that you continue on your current medications as directed. Please refer to the Current Medication list given to you today.  *If you need a refill on your cardiac medications before your next appointment, please call your pharmacy*   Testing/Procedures: Your physician has requested that you have an echocardiogram. Echocardiography is a painless test that uses sound waves to create images of your heart. It provides your doctor with information about the size and shape of your heart and how well your heart's chambers and valves are working. This procedure takes approximately one hour. There are no restrictions for this procedure. This will be done at our [facility] location:  [address]   ZIO XT- Long Term Monitor Instructions   Your physician has requested you wear your ZIO patch monitor for 14 days.   This is a single patch monitor.  Irhythm supplies one patch monitor per enrollment.  Additional stickers are not available.   Please do not apply patch if you will be having a Nuclear Stress Test, Echocardiogram, Cardiac CT, MRI, or Chest Xray during the time frame you would be wearing the monitor. The patch cannot be worn during these tests.  You cannot remove and re-apply the ZIO XT patch monitor.   Your ZIO patch monitor will be sent USPS Priority mail from [facility] directly to your home address. The monitor may also be mailed to a PO BOX if home delivery is not available.   It may take 3-5 days to receive your monitor after you have been enrolled.   Once you have received you monitor, please review enclosed instructions.  Your monitor has already been registered assigning a specific monitor serial # to you.   Applying the monitor   Shave hair from upper left chest.   Hold abrader disc by orange tab.  Rub abrader in 40 strokes over left upper chest as indicated in your monitor instructions.   Clean area  with 4 enclosed alcohol pads .  Use all pads to assure are is cleaned thoroughly.  Let dry.   Apply patch as indicated in monitor instructions.  Patch will be place under collarbone on left side of chest with arrow pointing upward.   Rub patch adhesive wings for 2 minutes.Remove white label marked "1".  Remove white label marked "2".  Rub patch adhesive wings for 2 additional minutes.   While looking in a mirror, press and release button in center of patch.  A small green light will flash 3-4 times .  This will be your only indicator the monitor has been turned on.     Do not shower for the first 24 hours.  You may shower after the first 24 hours.   Press button if you feel a symptom. You will hear a small click.  Record Date, Time and Symptom in the Patient Log Book.   When you are ready to remove patch, follow instructions on last 2 pages of Patient Log Book.  Stick patch monitor onto last page of Patient Log Book.   Place Patient Log Book in [place] box.  Use locking tab on box and tape box closed securely.  The Orange and [employer] has [employer] on it.  Please place in mailbox as soon as possible.  Your physician should have your test results approximately 7 days after the monitor has been mailed back to [facility].   Call [facility] Customer Care at [phone] if you  have questions regarding your ZIO XT patch monitor.  Call them immediately if you see an orange light blinking on your monitor.   If your monitor falls off in less than 4 days contact our Monitor department at [phone].  If your monitor becomes loose or falls off after 4 days call [employer] at [phone] for suggestions on securing your monitor.   Non-Cardiac CT scanning, (CAT scanning), is a noninvasive, special x-ray that produces cross-sectional images of the body using x-rays and a computer. CT scans help physicians diagnose and treat medical conditions. For some CT exams, a contrast material is used to  enhance visibility in the area of the body being studied. CT scans provide greater clarity and reveal more details than regular x-ray exams. --CT chest w/o contrast in 1 YEAR     Follow-Up: At [facility], you and your health needs are our priority.  As part of our continuing mission to provide you with exceptional heart care, we have created designated Provider Care Teams.  These Care Teams include your primary Cardiologist (physician) and Advanced Practice Providers (APPs -  Physician Assistants and Nurse Practitioners) who all work together to provide you with the care you need, when you need it.  We recommend signing up for the patient portal called "MyChart".  Sign up information is provided on this After Visit Summary.  MyChart is used to connect with patients for Virtual Visits (Telemedicine).  Patients are able to view lab/test results, encounter notes, upcoming appointments, etc.  Non-urgent messages can be sent to your provider as well.   To learn more about what you can do with MyChart, go to [email or web address].    Your next appointment:   3 month(s)  The format for your next appointment:   In Person  Provider:   Oswaldo Milian, MD

## 2020-04-11 NOTE — Progress Notes (Signed)
Patient ID: Walter Reynolds., male   DOB: [birth year], 49 y.o.   MRN: [id number] 14 day ZIO XT long term holter monitor shipped to patients home.

## 2020-04-13 ENCOUNTER — Other Ambulatory Visit: Payer: Self-pay

## 2020-04-13 ENCOUNTER — Encounter: Payer: Self-pay | Admitting: Emergency Medicine

## 2020-04-13 ENCOUNTER — Ambulatory Visit: Payer: BC Managed Care – PPO | Admitting: Emergency Medicine

## 2020-04-13 VITALS — BP 127/76 | HR 88 | Temp 98.0°F | Resp 16 | Ht 67.0 in | Wt 254.0 lb

## 2020-04-13 DIAGNOSIS — Z1211 Encounter for screening for malignant neoplasm of colon: Secondary | ICD-10-CM

## 2020-04-13 DIAGNOSIS — E785 Hyperlipidemia, unspecified: Secondary | ICD-10-CM

## 2020-04-13 DIAGNOSIS — I10 Essential (primary) hypertension: Secondary | ICD-10-CM

## 2020-04-13 DIAGNOSIS — G4733 Obstructive sleep apnea (adult) (pediatric): Secondary | ICD-10-CM

## 2020-04-13 DIAGNOSIS — E1159 Type 2 diabetes mellitus with other circulatory complications: Secondary | ICD-10-CM

## 2020-04-13 DIAGNOSIS — E1169 Type 2 diabetes mellitus with other specified complication: Secondary | ICD-10-CM | POA: Diagnosis not present

## 2020-04-13 DIAGNOSIS — Z6841 Body Mass Index (BMI) 40.0 and over, adult: Secondary | ICD-10-CM

## 2020-04-13 LAB — POCT GLYCOSYLATED HEMOGLOBIN (HGB A1C): Hemoglobin A1C: 7.3 % — AB (ref 4.0–5.6)

## 2020-04-13 LAB — GLUCOSE, POCT (MANUAL RESULT ENTRY): POC Glucose: 102 mg/dl — AB (ref 70–99)

## 2020-04-13 LAB — LIPID PANEL

## 2020-04-13 MED ORDER — GLIPIZIDE 5 MG PO TABS: 5.0000 mg | ORAL_TABLET | Freq: Every day | ORAL | 3 refills | Status: DC

## 2020-04-13 NOTE — Patient Instructions (Addendum)
   If you have lab work done today you will be contacted with your lab results within the next 2 weeks.  If you have not heard from us then please contact us. The fastest way to get your results is to register for My Chart.   IF you received an x-ray today, you will receive an invoice from [facility]. Please contact [facility] at [phone] with questions or concerns regarding your invoice.   IF you received labwork today, you will receive an invoice from [facility]. Please contact [facility] at [phone] with questions or concerns regarding your invoice.   Our billing staff will not be able to assist you with questions regarding bills from these companies.  You will be contacted with the lab results as soon as they are available. The fastest way to get your results is to activate your My Chart account. Instructions are located on the last page of this paperwork. If you have not heard from us regarding the results in 2 weeks, please contact this office.      Diabetes Mellitus and Nutrition, Adult When you have diabetes (diabetes mellitus), it is very important to have healthy eating habits because your blood sugar (glucose) levels are greatly affected by what you eat and drink. Eating healthy foods in the appropriate amounts, at about the same times every day, can help you:  Control your blood glucose.  Lower your risk of heart disease.  Improve your blood pressure.  Reach or maintain a healthy weight. Every person with diabetes is different, and each person has different needs for a meal plan. Your health care provider may recommend that you work with a diet and nutrition specialist (dietitian) to make a meal plan that is best for you. Your meal plan may vary depending on factors such as:  The calories you need.  The medicines you take.  Your weight.  Your blood glucose, blood pressure, and cholesterol levels.  Your activity level.  Other health  conditions you have, such as heart or kidney disease. How do carbohydrates affect me? Carbohydrates, also called carbs, affect your blood glucose level more than any other type of food. Eating carbs naturally raises the amount of glucose in your blood. Carb counting is a method for keeping track of how many carbs you eat. Counting carbs is important to keep your blood glucose at a healthy level, especially if you use insulin or take certain oral diabetes medicines. It is important to know how many carbs you can safely have in each meal. This is different for every person. Your dietitian can help you calculate how many carbs you should have at each meal and for each snack. Foods that contain carbs include:  Bread, cereal, rice, pasta, and crackers.  Potatoes and corn.  Peas, beans, and lentils.  Milk and yogurt.  Fruit and juice.  Desserts, such as cakes, cookies, ice cream, and candy. How does alcohol affect me? Alcohol can cause a sudden decrease in blood glucose (hypoglycemia), especially if you use insulin or take certain oral diabetes medicines. Hypoglycemia can be a life-threatening condition. Symptoms of hypoglycemia (sleepiness, dizziness, and confusion) are similar to symptoms of having too much alcohol. If your health care provider says that alcohol is safe for you, follow these guidelines:  Limit alcohol intake to no more than 1 drink per day for nonpregnant women and 2 drinks per day for men. One drink equals 12 oz of beer, 5 oz of wine, or 1 oz of hard   liquor.  Do not drink on an empty stomach.  Keep yourself hydrated with water, diet soda, or unsweetened iced tea.  Keep in mind that regular soda, juice, and other mixers may contain a lot of sugar and must be counted as carbs. What are tips for following this plan?  Reading food labels  Start by checking the serving size on the "Nutrition Facts" label of packaged foods and drinks. The amount of calories, carbs, fats, and  other nutrients listed on the label is based on one serving of the item. Many items contain more than one serving per package.  Check the total grams (g) of carbs in one serving. You can calculate the number of servings of carbs in one serving by dividing the total carbs by 15. For example, if a food has 30 g of total carbs, it would be equal to 2 servings of carbs.  Check the number of grams (g) of saturated and trans fats in one serving. Choose foods that have low or no amount of these fats.  Check the number of milligrams (mg) of salt (sodium) in one serving. Most people should limit total sodium intake to less than 2,300 mg per day.  Always check the nutrition information of foods labeled as "low-fat" or "nonfat". These foods may be higher in added sugar or refined carbs and should be avoided.  Talk to your dietitian to identify your daily goals for nutrients listed on the label. Shopping  Avoid buying canned, premade, or processed foods. These foods tend to be high in fat, sodium, and added sugar.  Shop around the outside edge of the grocery store. This includes fresh fruits and vegetables, bulk grains, fresh meats, and fresh dairy. Cooking  Use low-heat cooking methods, such as baking, instead of high-heat cooking methods like deep frying.  Cook using healthy oils, such as olive, canola, or sunflower oil.  Avoid cooking with butter, cream, or high-fat meats. Meal planning  Eat meals and snacks regularly, preferably at the same times every day. Avoid going long periods of time without eating.  Eat foods high in fiber, such as fresh fruits, vegetables, beans, and whole grains. Talk to your dietitian about how many servings of carbs you can eat at each meal.  Eat 4-6 ounces (oz) of lean protein each day, such as lean meat, chicken, fish, eggs, or tofu. One oz of lean protein is equal to: ? 1 oz of meat, chicken, or fish. ? 1 egg. ?  cup of tofu.  Eat some foods each day that  contain healthy fats, such as avocado, nuts, seeds, and fish. Lifestyle  Check your blood glucose regularly.  Exercise regularly as told by your health care provider. This may include: ? 150 minutes of moderate-intensity or vigorous-intensity exercise each week. This could be brisk walking, biking, or water aerobics. ? Stretching and doing strength exercises, such as yoga or weightlifting, at least 2 times a week.  Take medicines as told by your health care provider.  Do not use any products that contain nicotine or tobacco, such as cigarettes and e-cigarettes. If you need help quitting, ask your health care provider.  Work with a counselor or diabetes educator to identify strategies to manage stress and any emotional and social challenges. Questions to ask a health care provider  Do I need to meet with a diabetes educator?  Do I need to meet with a dietitian?  What number can I call if I have questions?  When are the best   times to check my blood glucose? Where to find more information:  American Diabetes Association: diabetes.org  Academy of Nutrition and Dietetics: www.eatright.org  National Institute of Diabetes and Digestive and Kidney Diseases (NIH): www.niddk.nih.gov Summary  A healthy meal plan will help you control your blood glucose and maintain a healthy lifestyle.  Working with a diet and nutrition specialist (dietitian) can help you make a meal plan that is best for you.  Keep in mind that carbohydrates (carbs) and alcohol have immediate effects on your blood glucose levels. It is important to count carbs and to use alcohol carefully. This information is not intended to replace advice given to you by your health care provider. Make sure you discuss any questions you have with your health care provider. Document Revised: 11/14/2017 Document Reviewed: 01/06/2017 [employer]vier Patient Education  2020 [employer]  

## 2020-04-13 NOTE — Assessment & Plan Note (Signed)
Well-controlled hypertension.  Continue present medications.  No changes. Hemoglobin A1c at 7.3 better than before.  Continue Metformin 1000 mg twice a day.  Start glipizide 5 mg in the morning with meals.  Diet and nutrition discussed. Follow-up in 3 months.

## 2020-04-13 NOTE — Progress Notes (Signed)
Walter Reynolds. 49 y.o.   Chief Complaint  Patient presents with  . Diabetes    follow up    HISTORY OF PRESENT ILLNESS: This is a 49 y.o. male with history of diabetes and hypertension recently in the emergency department complaining of chest pain.  Here for follow-up.  Saw cardiologist in the office last week, assessment as follows: ASSESSMENT:    1. Chest pain of uncertain etiology   2. Palpitations   3. Lung nodule   4. Essential hypertension   5. Hyperlipidemia, unspecified hyperlipidemia type    PLAN:    CAD: Presented with chest pain to ED on 03/31/2020, coronary CTA showed  calcium score 110 (95th percentile), nonobstructive CAD with mixed plaque in the proximal RCA causing mild (25 to 49%) stenosis. -Continue atorvastatin 40 mg daily -Check TTE  Palpitations: Episode of chest pain precipitated by feeling that heart is racing.  Will check Zio patch times 2 weeks  Hypertension: Continue lisinopril 20 mg daily.  Appears controlled  Pulmonary nodule: 5 mm nodule noted on recent coronary CTA.  Given smoking history, will check repeat CT chest in 1 year  Type 2 diabetes: On Metformin.  A1c 7.7 on 04/01/2020  Hyperlipidemia: LDL 74 on 12/29/2019.  On atorvastatin 40 mg daily   HPI   Prior to Admission medications   Medication Sig Start Date End Date Taking? Authorizing Provider  aspirin EC 81 MG EC tablet Take 1 tablet (81 mg total) by mouth daily. 04/02/20  Yes Albertine Grates, MD  atorvastatin (LIPITOR) 20 MG tablet Take 2 tablets (40 mg total) by mouth daily. 04/01/20  Yes Albertine Grates, MD  fluticasone (FLONASE) 50 MCG/ACT nasal spray SPRAY 2 SPRAYS INTO EACH NOSTRIL EVERY DAY Patient taking differently: Place 2 sprays into both nostrils daily.  09/14/19  Yes Georgina Quint, MD  metFORMIN (GLUCOPHAGE) 1000 MG tablet Take 1 tablet (1,000 mg total) by mouth 2 (two) times daily with a meal. 09/27/19  Yes Dugan Vanhoesen, Eilleen Kempf, MD  lisinopril (ZESTRIL) 20 MG tablet  Take 1 tablet (20 mg total) by mouth daily. 09/27/19 04/01/20  Georgina Quint, MD    Allergies  Allergen Reactions  . Doxycycline Hives  . Erythromycin Base Hives    Patient Active Problem List   Diagnosis Date Noted  . Hypertension associated with diabetes (HCC) 09/27/2019  . Class 3 severe obesity with serious comorbidity and body mass index (BMI) of 40.0 to 44.9 in adult (HCC) 09/27/2019  . Dyslipidemia 09/09/2018  . Environmental allergies 09/09/2018  . BMI 39.0-39.9,adult 09/03/2017  . Diabetes mellitus type II, non insulin dependent (HCC) 09/03/2017  . Anemia 04/14/2017  . Hyperlipidemia 05/02/2016  . OSA (obstructive sleep apnea) 09/06/2015  . Benign essential HTN 09/06/2015    Past Medical History:  Diagnosis Date  . Allergy   . Diabetes mellitus without complication (HCC)   . Elevated cholesterol   . Hernia    bi lateral hernia repair  . History of kidney stones   . Hypertension   . Sleep apnea 2014   severe  . Umbilical hernia without obstruction and without gangrene 09/03/2017    Past Surgical History:  Procedure Laterality Date  . CARPAL TUNNEL RELEASE Right 2007  . CARPAL TUNNEL RELEASE Left 10/28/2014   Procedure: LEFT CARPAL TUNNEL RELEASE;  Surgeon: Dairl Ponder, MD;  Location: [facility];  Service: Orthopedics;  Laterality: Left;  . FOOT SURGERY Left   . INGUINAL HERNIA REPAIR     times two,  each side  . KIDNEY STONE SURGERY    . MASS EXCISION Left 10/28/2014   Procedure: LEFT WRIST VOLAR MASS EXCISION;  Surgeon: Dairl Ponder, MD;  Location: [facility];  Service: Orthopedics;  Laterality: Left;  . UMBILICAL HERNIA REPAIR N/A 10/27/2017   Procedure: HERNIA REPAIR UMBILICAL ADULT;  Surgeon: Berna Bue, MD;  Location: [facility];  Service: General;  Laterality: N/A;    Social History   Socioeconomic History  . Marital status: Married    Spouse name: ALDWIN MICALIZZI  . Number of children: 2  . Years  of education: Associates  . Highest education level: Not on file  Occupational History  . Occupation: Merchandiser, retail    Comment: [employer]  Tobacco Use  . Smoking status: Never Smoker  . Smokeless tobacco: Never Used  Substance and Sexual Activity  . Alcohol use: No    Alcohol/week: 0.0 standard drinks  . Drug use: No  . Sexual activity: Yes    Partners: Female    Birth control/protection: Condom  Other Topics Concern  . Not on file  Social History Narrative   Lives with his wife and their 2 children.   He has 3 sons from a previous relationship that live independently.   Formerly in the [employer].   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   Social Determinants of Health   Financial Resource Strain:   . Difficulty of Paying Living Expenses:   Food Insecurity:   . Worried About Programme researcher, broadcasting/film/video in the Last Year:   . Barista in the Last Year:   Transportation Needs:   . Freight forwarder (Medical):   Marland Kitchen Lack of Transportation (Non-Medical):   Physical Activity:   . Days of Exercise per Week:   . Minutes of Exercise per Session:   Stress:   . Feeling of Stress :   Social Connections:   . Frequency of Communication with Friends and Family:   . Frequency of Social Gatherings with Friends and Family:   . Attends Religious Services:   . Active Member of Clubs or Organizations:   . Attends Banker Meetings:   Marland Kitchen Marital Status:   Intimate Partner Violence:   . Fear of Current or Ex-Partner:   . Emotionally Abused:   Marland Kitchen Physically Abused:   . Sexually Abused:     Family History  Problem Relation Age of Onset  . Diabetes Mother   . Stroke Mother        4 strokes  . Heart attack Mother   . Heart disease Mother   . Hypertension Father   . Hypertension Sister   . Heart disease Sister        pacemaker     Review of Systems  Constitutional: Negative.   Negative for chills and fever.  HENT: Negative.  Negative for congestion and sore throat.   Respiratory: Negative.  Negative for cough and shortness of breath.   Cardiovascular: Negative.  Negative for chest pain and palpitations.  Gastrointestinal: Negative.  Negative for abdominal pain, blood in stool, diarrhea, melena, nausea and vomiting.  Genitourinary: Negative.  Negative for dysuria and hematuria.  Musculoskeletal: Negative.  Negative for myalgias.  Skin: Negative.  Negative for rash.  Neurological: Negative.  Negative for dizziness and headaches.  All other systems reviewed and are negative.  Today's Vitals   04/13/20 1448  BP: 127/76  Pulse: 88  Resp: 16  Temp: 98 F (36.7 C)  TempSrc: Temporal  SpO2: 95%  Weight: 254 lb (115.2 kg)  Height: 5\' 7"  (1.702 m)   Body mass index is 39.78 kg/m.   Physical Exam Vitals reviewed.  Constitutional:      Appearance: Normal appearance. He is obese.  HENT:     Head: Normocephalic.  Eyes:     Extraocular Movements: Extraocular movements intact.     Conjunctiva/sclera: Conjunctivae normal.     Pupils: Pupils are equal, round, and reactive to light.  Cardiovascular:     Rate and Rhythm: Normal rate and regular rhythm.     Pulses: Normal pulses.     Heart sounds: Normal heart sounds.  Pulmonary:     Effort: Pulmonary effort is normal.     Breath sounds: Normal breath sounds.  Musculoskeletal:        General: Normal range of motion.     Cervical back: Normal range of motion.  Skin:    General: Skin is warm and dry.     Capillary Refill: Capillary refill takes less than 2 seconds.  Neurological:     General: No focal deficit present.     Mental Status: He is alert and oriented to person, place, and time.  Psychiatric:        Mood and Affect: Mood normal.        Behavior: Behavior normal.    Results for orders placed or performed in visit on 04/13/20 (from the past 24 hour(s))  POCT glucose (manual entry)     Status:  Abnormal   Collection Time: 04/13/20  3:28 PM  Result Value Ref Range   POC Glucose 102 (A) 70 - 99 mg/dl  POCT glycosylated hemoglobin (Hb A1C)     Status: Abnormal   Collection Time: 04/13/20  3:34 PM  Result Value Ref Range   Hemoglobin A1C 7.3 (A) 4.0 - 5.6 %   HbA1c POC (<> result, manual entry)     HbA1c, POC (prediabetic range)     HbA1c, POC (controlled diabetic range)       ASSESSMENT & PLAN: Hypertension associated with diabetes (HCC) Well-controlled hypertension.  Continue present medications.  No changes. Hemoglobin A1c at 7.3 better than before.  Continue Metformin 1000 mg twice a day.  Start glipizide 5 mg in the morning with meals.  Diet and nutrition discussed. Follow-up in 3 months.  Duwayne Hecksaiah was seen today for diabetes.  Diagnoses and all orders for this visit:  Hypertension associated with diabetes (HCC) -     CBC with Differential/Platelet -     Comprehensive metabolic panel -     POCT glucose (manual entry) -     POCT glycosylated hemoglobin (Hb A1C) -     glipiZIDE (GLUCOTROL) 5 MG tablet; Take 1 tablet (5 mg total) by mouth daily with breakfast.  Dyslipidemia associated with type 2 diabetes mellitus (HCC) -     Lipid panel -     POCT glucose (manual entry) -     POCT glycosylated hemoglobin (Hb A1C)  Class 3 severe obesity with serious comorbidity and body mass index (BMI) of 40.0 to 44.9 in adult, unspecified obesity type (HCC)  OSA (obstructive sleep apnea)  Screening for colon cancer -     Cologuard    Patient Instructions       If you have lab work done today you will be contacted with your lab results within the next 2 weeks.  If you have  not heard from [place] then please contact us. The fastest way to get your results is to register for My Chart.   IF you received an x-ray today, you will receive an invoice from [facility]. Please contact [facility] at [phone] with questions or concerns regarding your invoice.    IF you received labwork today, you will receive an invoice from [facility]. Please contact [facility] at [phone] with questions or concerns regarding your invoice.   Our billing staff will not be able to assist you with questions regarding bills from these companies.  You will be contacted with the lab results as soon as they are available. The fastest way to get your results is to activate your My Chart account. Instructions are located on the last page of this paperwork. If you have not heard from [place] regarding the results in 2 weeks, please contact this office.     Diabetes Mellitus and Nutrition, Adult When you have diabetes (diabetes mellitus), it is very important to have healthy eating habits because your blood sugar (glucose) levels are greatly affected by what you eat and drink. Eating healthy foods in the appropriate amounts, at about the same times every day, can help you:  Control your blood glucose.  Lower your risk of heart disease.  Improve your blood pressure.  Reach or maintain a healthy weight. Every person with diabetes is different, and each person has different needs for a meal plan. Your health care provider may recommend that you work with a diet and nutrition specialist (dietitian) to make a meal plan that is best for you. Your meal plan may vary depending on factors such as:  The calories you need.  The medicines you take.  Your weight.  Your blood glucose, blood pressure, and cholesterol levels.  Your activity level.  Other health conditions you have, such as heart or kidney disease. How do carbohydrates affect me? Carbohydrates, also called carbs, affect your blood glucose level more than any other type of food. Eating carbs naturally raises the amount of glucose in your blood. Carb counting is a method for keeping track of how many carbs you eat. Counting carbs is important to keep your blood glucose at a healthy level, especially if you use insulin or  take certain oral diabetes medicines. It is important to know how many carbs you can safely have in each meal. This is different for every person. Your dietitian can help you calculate how many carbs you should have at each meal and for each snack. Foods that contain carbs include:  Bread, cereal, rice, pasta, and crackers.  Potatoes and corn.  Peas, beans, and lentils.  Milk and yogurt.  Fruit and juice.  Desserts, such as cakes, cookies, ice cream, and candy. How does alcohol affect me? Alcohol can cause a sudden decrease in blood glucose (hypoglycemia), especially if you use insulin or take certain oral diabetes medicines. Hypoglycemia can be a life-threatening condition. Symptoms of hypoglycemia (sleepiness, dizziness, and confusion) are similar to symptoms of having too much alcohol. If your health care provider says that alcohol is safe for you, follow these guidelines:  Limit alcohol intake to no more than 1 drink per day for nonpregnant women and 2 drinks per day for men. One drink equals 12 oz of beer, 5 oz of wine, or 1 oz of hard liquor.  Do not drink on an empty stomach.  Keep yourself hydrated with water, diet soda, or unsweetened iced tea.  Keep in mind that regular  soda, juice, and other mixers may contain a lot of sugar and must be counted as carbs. What are tips for following this plan?  Reading food labels  Start by checking the serving size on the "Nutrition Facts" label of packaged foods and drinks. The amount of calories, carbs, fats, and other nutrients listed on the label is based on one serving of the item. Many items contain more than one serving per package.  Check the total grams (g) of carbs in one serving. You can calculate the number of servings of carbs in one serving by dividing the total carbs by 15. For example, if a food has 30 g of total carbs, it would be equal to 2 servings of carbs.  Check the number of grams (g) of saturated and trans fats in  one serving. Choose foods that have low or no amount of these fats.  Check the number of milligrams (mg) of salt (sodium) in one serving. Most people should limit total sodium intake to less than 2,300 mg per day.  Always check the nutrition information of foods labeled as "low-fat" or "nonfat". These foods may be higher in added sugar or refined carbs and should be avoided.  Talk to your dietitian to identify your daily goals for nutrients listed on the label. Shopping  Avoid buying canned, premade, or processed foods. These foods tend to be high in fat, sodium, and added sugar.  Shop around the outside edge of the grocery store. This includes fresh fruits and vegetables, bulk grains, fresh meats, and fresh dairy. Cooking  Use low-heat cooking methods, such as baking, instead of high-heat cooking methods like deep frying.  Cook using healthy oils, such as olive, canola, or sunflower oil.  Avoid cooking with butter, cream, or high-fat meats. Meal planning  Eat meals and snacks regularly, preferably at the same times every day. Avoid going long periods of time without eating.  Eat foods high in fiber, such as fresh fruits, vegetables, beans, and whole grains. Talk to your dietitian about how many servings of carbs you can eat at each meal.  Eat 4-6 ounces (oz) of lean protein each day, such as lean meat, chicken, fish, eggs, or tofu. One oz of lean protein is equal to: ? 1 oz of meat, chicken, or fish. ? 1 egg. ?  cup of tofu.  Eat some foods each day that contain healthy fats, such as avocado, nuts, seeds, and fish. Lifestyle  Check your blood glucose regularly.  Exercise regularly as told by your health care provider. This may include: ? 150 minutes of moderate-intensity or vigorous-intensity exercise each week. This could be brisk walking, biking, or water aerobics. ? Stretching and doing strength exercises, such as yoga or weightlifting, at least 2 times a week.  Take  medicines as told by your health care provider.  Do not use any products that contain nicotine or tobacco, such as cigarettes and e-cigarettes. If you need help quitting, ask your health care provider.  Work with a Veterinary surgeon or diabetes educator to identify strategies to manage stress and any emotional and social challenges. Questions to ask a health care provider  Do I need to meet with a diabetes educator?  Do I need to meet with a dietitian?  What number can I call if I have questions?  When are the best times to check my blood glucose? Where to find more information:  American Diabetes Association: diabetes.org  Academy of Nutrition and Dietetics: www.eatright.[employer] of  Diabetes and Digestive and Kidney Diseases (NIH): CarFlippers.tn Summary  A healthy meal plan will help you control your blood glucose and maintain a healthy lifestyle.  Working with a diet and nutrition specialist (dietitian) can help you make a meal plan that is best for you.  Keep in mind that carbohydrates (carbs) and alcohol have immediate effects on your blood glucose levels. It is important to count carbs and to use alcohol carefully. This information is not intended to replace advice given to you by your health care provider. Make sure you discuss any questions you have with your health care provider. Document Revised: 11/14/2017 Document Reviewed: 01/06/2017 Elsevier Patient Education  2020 Elsevier Inc.      Edwina Barth, MD [facility]

## 2020-04-14 ENCOUNTER — Other Ambulatory Visit (INDEPENDENT_AMBULATORY_CARE_PROVIDER_SITE_OTHER): Payer: BC Managed Care – PPO

## 2020-04-14 DIAGNOSIS — R002 Palpitations: Secondary | ICD-10-CM

## 2020-04-14 LAB — LIPID PANEL
Chol/HDL Ratio: 5 ratio (ref 0.0–5.0)
Cholesterol, Total: 125 mg/dL (ref 100–199)
HDL: 25 mg/dL — ABNORMAL LOW (ref >39)
LDL Chol Calc (NIH): 68 mg/dL (ref 0–99)
Triglycerides: 192 mg/dL — ABNORMAL HIGH (ref 0–149)
VLDL Cholesterol Cal: 32 mg/dL (ref 5–40)

## 2020-04-14 LAB — CBC WITH DIFFERENTIAL/PLATELET
Basophils Absolute: 0.1 10*3/uL (ref 0.0–0.2)
Basos: 1 %
EOS (ABSOLUTE): 0.3 10*3/uL (ref 0.0–0.4)
Eos: 3 %
Hematocrit: 41.9 % (ref 37.5–51.0)
Hemoglobin: 13.2 g/dL (ref 13.0–17.7)
Immature Grans (Abs): 0 10*3/uL (ref 0.0–0.1)
Immature Granulocytes: 0 %
Lymphocytes Absolute: 3.7 10*3/uL — ABNORMAL HIGH (ref 0.7–3.1)
Lymphs: 38 %
MCH: 23.7 pg — ABNORMAL LOW (ref 26.6–33.0)
MCHC: 31.5 g/dL (ref 31.5–35.7)
MCV: 75 fL — ABNORMAL LOW (ref 79–97)
Monocytes Absolute: 0.9 10*3/uL (ref 0.1–0.9)
Monocytes: 10 %
Neutrophils Absolute: 4.6 10*3/uL (ref 1.4–7.0)
Neutrophils: 48 %
Platelets: 292 10*3/uL (ref 150–450)
RBC: 5.58 x10E6/uL (ref 4.14–5.80)
RDW: 15.3 % (ref 11.6–15.4)
WBC: 9.5 10*3/uL (ref 3.4–10.8)

## 2020-04-14 LAB — COMPREHENSIVE METABOLIC PANEL
ALT: 30 IU/L (ref 0–44)
AST: 31 IU/L (ref 0–40)
Albumin/Globulin Ratio: 2 (ref 1.2–2.2)
Albumin: 4.7 g/dL (ref 4.0–5.0)
Alkaline Phosphatase: 95 IU/L (ref 39–117)
BUN/Creatinine Ratio: 12 (ref 9–20)
BUN: 12 mg/dL (ref 6–24)
Bilirubin Total: 0.3 mg/dL (ref 0.0–1.2)
CO2: 23 mmol/L (ref 20–29)
Calcium: 9.6 mg/dL (ref 8.7–10.2)
Chloride: 102 mmol/L (ref 96–106)
Creatinine, Ser: 1.01 mg/dL (ref 0.76–1.27)
GFR calc Af Amer: 100 mL/min/{1.73_m2} (ref >59)
GFR calc non Af Amer: 86 mL/min/{1.73_m2} (ref >59)
Globulin, Total: 2.3 g/dL (ref 1.5–4.5)
Glucose: 94 mg/dL (ref 65–99)
Potassium: 4.3 mmol/L (ref 3.5–5.2)
Sodium: 140 mmol/L (ref 134–144)
Total Protein: 7 g/dL (ref 6.0–8.5)

## 2020-04-27 DIAGNOSIS — Z1211 Encounter for screening for malignant neoplasm of colon: Secondary | ICD-10-CM | POA: Diagnosis not present

## 2020-05-01 ENCOUNTER — Other Ambulatory Visit (HOSPITAL_COMMUNITY): Payer: BC Managed Care – PPO

## 2020-05-03 LAB — COLOGUARD: COLOGUARD: NEGATIVE

## 2020-05-08 ENCOUNTER — Encounter: Payer: Self-pay | Admitting: Emergency Medicine

## 2020-05-08 LAB — COLOGUARD: Cologuard: NEGATIVE

## 2020-05-24 ENCOUNTER — Other Ambulatory Visit: Payer: Self-pay

## 2020-05-24 ENCOUNTER — Ambulatory Visit (HOSPITAL_COMMUNITY): Payer: Self-pay | Attending: Cardiology

## 2020-05-24 DIAGNOSIS — R079 Chest pain, unspecified: Secondary | ICD-10-CM | POA: Insufficient documentation

## 2020-06-05 ENCOUNTER — Ambulatory Visit: Payer: Self-pay | Admitting: Cardiology

## 2020-07-10 NOTE — Progress Notes (Deleted)
Cardiology Office Note:    Date:  07/10/2020   ID:  Walter Males., DOB Sep 28, 1971, MRN [id number]  PCP:  Georgina Quint, MD  Cardiologist:  Little Ishikawa, MD  Electrophysiologist:  None   Referring MD: Georgina Quint, *   No chief complaint on file.   History of Present Illness:    Walter Guay. is a 49 y.o. male with a hx of hypertension, type 2 diabetes, obesity, hyperlipidemia, OSA who presents for follow-up.  He presented to the ED on 03/31/2020 with chest pain.  Reports that he was mowing his grass that day when he developed chest pain.  States that he felt his heart was racing and as he continued moving his symptoms worsen.  He eventually had to stop and his symptoms resolved over time.  On presentation to the ED, EKG revealed no ST/T wave abnormalities.  High-sensitivity troponin was 6 > 26 >18.  Coronary CTA was done on 04/01/2020 which showed calcium score 110 (95th percentile), nonobstructive CAD with mixed plaque in the proximal RCA causing mild (25 to 49%) stenosis.  5 mm pulmonary nodule also seen.  Zio patch x2 weeks on 05/12/2020 showed 2 short episodes of NSVT, otherwise no significant abnormalities.  Echocardiogram on 05/24/2020 showed LVEF 45 to 50%, grade 1 diastolic dysfunction, normal RV function.  Reports that he smoked cigars for 5 years, but quit over 20 years ago.  Family history includes sister had likely postpartum cardiomyopathy, had an ICD placed.  Mother had CVA and MI in 29s.  Since last clinic visit,  Since ED visit, he reports that he is doing well.  He denies any further chest pain since his ED visit.  No further palpitations.  Denies any lightheadedness or syncope.  Does note some lower extremity edema at the end of each day.     Past Medical History:  Diagnosis Date  . Allergy   . Diabetes mellitus without complication (HCC)   . Elevated cholesterol   . Hernia    bi lateral hernia repair  . History of kidney stones   .  Hypertension   . Sleep apnea 2014   severe  . Umbilical hernia without obstruction and without gangrene 09/03/2017    Past Surgical History:  Procedure Laterality Date  . CARPAL TUNNEL RELEASE Right 2007  . CARPAL TUNNEL RELEASE Left 10/28/2014   Procedure: LEFT CARPAL TUNNEL RELEASE;  Surgeon: Dairl Ponder, MD;  Location: [facility];  Service: Orthopedics;  Laterality: Left;  . FOOT SURGERY Left   . INGUINAL HERNIA REPAIR     times two, each side  . KIDNEY STONE SURGERY    . MASS EXCISION Left 10/28/2014   Procedure: LEFT WRIST VOLAR MASS EXCISION;  Surgeon: Dairl Ponder, MD;  Location: [facility];  Service: Orthopedics;  Laterality: Left;  . UMBILICAL HERNIA REPAIR N/A 10/27/2017   Procedure: HERNIA REPAIR UMBILICAL ADULT;  Surgeon: Berna Bue, MD;  Location: [facility];  Service: General;  Laterality: N/A;    Current Medications: No outpatient medications have been marked as taking for the 07/11/20 encounter (Appointment) with Little Ishikawa, MD.     Allergies:   Doxycycline and Erythromycin base   Social History   Socioeconomic History  . Marital status: Married    Spouse name: Walter Reynolds  . Number of children: 2  . Years of education: Associates  . Highest education level: Not on file  Occupational History  . Occupation: Merchandiser, retail  Comment: [employer]  Tobacco Use  . Smoking status: Never Smoker  . Smokeless tobacco: Never Used  Vaping Use  . Vaping Use: Never used  Substance and Sexual Activity  . Alcohol use: No    Alcohol/week: 0.0 standard drinks  . Drug use: No  . Sexual activity: Yes    Partners: Female    Birth control/protection: Condom  Other Topics Concern  . Not on file  Social History Narrative   Lives with his wife and their 2 children.   He has 3 sons from a previous relationship that live independently.   Formerly in the [employer].   Culinary degree, cuts hair, also has a business  Community education officer; hopes to retire from [employer] after 20 years.   Social Determinants of Health   Financial Resource Strain:   . Difficulty of Paying Living Expenses:   Food Insecurity:   . Worried About Programme researcher, broadcasting/film/video in the Last Year:   . Barista in the Last Year:   Transportation Needs:   . Freight forwarder (Medical):   Marland Kitchen Lack of Transportation (Non-Medical):   Physical Activity:   . Days of Exercise per Week:   . Minutes of Exercise per Session:   Stress:   . Feeling of Stress :   Social Connections:   . Frequency of Communication with Friends and Family:   . Frequency of Social Gatherings with Friends and Family:   . Attends Religious Services:   . Active Member of Clubs or Organizations:   . Attends Banker Meetings:   Marland Kitchen Marital Status:      Family History: The patient's family history includes Diabetes in his mother; Heart attack in his mother; Heart disease in his mother and sister; Hypertension in his father and sister; Stroke in his mother.  ROS:   Please see the history of present illness.     All other systems reviewed and are negative.  EKGs/Labs/Other Studies Reviewed:    The following studies were reviewed today:   EKG:  EKG is not ordered today.   Coronary CTA 04/01/20: 1. Coronary calcium score of 110. This was 95th percentile for age and sex matched control.  2. Normal coronary origin with right dominance.  3. Nonobstructive CAD with mixed plaque in the proximal RCA causing mild (25-49%) stenosis  CAD-RADS 1. Minimal non-obstructive CAD (0-24%). Consider non-atherosclerotic causes of chest pain. Consider preventive therapy and risk factor modification.  1. No acute abnormality. 2. Small peripheral densities along the lower lobes are nonspecific and likely related to atelectasis. 5 mm peripheral nodule in the left lower lobe is indeterminate. No follow-up needed if patient is low-risk. Non-contrast  chest CT can be considered in 12 months if patient is high-risk. This recommendation follows the consensus statement: Guidelines for Management of Incidental Pulmonary Nodules Detected on CT Images: From the Fleischner Society 2017; Radiology 2017; 284:228-243. 3. Visualized liver has low-density and suspicious for hepatic steatosis.   Recent Labs: 04/01/2020: Magnesium 1.7; TSH 1.391 04/13/2020: ALT 30; BUN 12; Creatinine, Ser 1.01; Hemoglobin 13.2; Platelets 292; Potassium 4.3; Sodium 140  Recent Lipid Panel    Component Value Date/Time   CHOL 125 04/13/2020 1532   TRIG 192 (H) 04/13/2020 1532   HDL 25 (L) 04/13/2020 1532   CHOLHDL 5.0 04/13/2020 1532   CHOLHDL 4.8 05/02/2016 0855   VLDL 17 05/02/2016 0855   LDLCALC 68 04/13/2020 1532    Physical Exam:    VS:  There  were no vitals taken for this visit.    Wt Readings from Last 3 Encounters:  04/13/20 254 lb (115.2 kg)  04/11/20 257 lb (116.6 kg)  03/31/20 259 lb 14.8 oz (117.9 kg)     GEN:   in no acute distress HEENT: Normal NECK: No JVD CARDIAC: RRR, no murmurs, rubs, gallops RESPIRATORY:  Clear to auscultation without rales, wheezing or rhonchi  ABDOMEN: Soft, non-tender, non-distended MUSCULOSKELETAL:  No edema; No deformity  SKIN: Warm and dry NEUROLOGIC:  Alert and oriented x 3 PSYCHIATRIC:  Normal affect   ASSESSMENT:    No diagnosis found. PLAN:    CAD: Presented with chest pain to ED on 03/31/2020, coronary CTA showed  calcium score 110 (95th percentile), nonobstructive CAD with mixed plaque in the proximal RCA causing mild (25 to 49%) stenosis. -Continue atorvastatin 40 mg daily  Systolic dysfunction: EF 45 to 50% on echocardiogram on 05/24/2020.  On lisinopril 20 mg daily.  Palpitations: Episode of chest pain precipitated by feeling that heart is racing.  Zio patch x2 weeks on 05/12/2020 showed 2 short episodes of NSVT, otherwise no significant abnormalities  Hypertension: Continue lisinopril 20 mg  daily.  Appears controlled  Pulmonary nodule: 5 mm nodule noted on recent coronary CTA.  Given smoking history, will check repeat CT chest in 1 year  Type 2 diabetes: On Metformin.  A1c 7.7 on 04/01/2020  Hyperlipidemia: LDL 74 on 12/29/2019.  On atorvastatin 40 mg daily  RTC in ***  Medication Adjustments/Labs and Tests Ordered: Current medicines are reviewed at length with the patient today.  Concerns regarding medicines are outlined above.  No orders of the defined types were placed in this encounter.  No orders of the defined types were placed in this encounter.   There are no Patient Instructions on file for this visit.   Signed, Little Ishikawa, MD  07/10/2020 3:38 PM    [facility]

## 2020-07-11 ENCOUNTER — Ambulatory Visit: Payer: BC Managed Care – PPO | Admitting: Cardiology

## 2020-07-17 ENCOUNTER — Encounter: Payer: Self-pay | Admitting: Emergency Medicine

## 2020-07-17 ENCOUNTER — Ambulatory Visit (INDEPENDENT_AMBULATORY_CARE_PROVIDER_SITE_OTHER): Payer: BC Managed Care – PPO | Admitting: Emergency Medicine

## 2020-07-17 ENCOUNTER — Other Ambulatory Visit: Payer: Self-pay

## 2020-07-17 VITALS — BP 130/84 | HR 81 | Temp 97.7°F | Ht 67.0 in | Wt 256.0 lb

## 2020-07-17 DIAGNOSIS — Z6841 Body Mass Index (BMI) 40.0 and over, adult: Secondary | ICD-10-CM | POA: Diagnosis not present

## 2020-07-17 DIAGNOSIS — E1159 Type 2 diabetes mellitus with other circulatory complications: Secondary | ICD-10-CM | POA: Diagnosis not present

## 2020-07-17 DIAGNOSIS — G4733 Obstructive sleep apnea (adult) (pediatric): Secondary | ICD-10-CM | POA: Diagnosis not present

## 2020-07-17 DIAGNOSIS — E1169 Type 2 diabetes mellitus with other specified complication: Secondary | ICD-10-CM | POA: Diagnosis not present

## 2020-07-17 DIAGNOSIS — I10 Essential (primary) hypertension: Secondary | ICD-10-CM

## 2020-07-17 DIAGNOSIS — E785 Hyperlipidemia, unspecified: Secondary | ICD-10-CM

## 2020-07-17 LAB — POCT GLYCOSYLATED HEMOGLOBIN (HGB A1C): Hemoglobin A1C: 7.3 % — AB (ref 4.0–5.6)

## 2020-07-17 LAB — GLUCOSE, POCT (MANUAL RESULT ENTRY): POC Glucose: 112 mg/dl — AB (ref 70–99)

## 2020-07-17 MED ORDER — DAPAGLIFLOZIN PROPANEDIOL 5 MG PO TABS: 5.0000 mg | ORAL_TABLET | Freq: Every day | ORAL | 3 refills | Status: DC

## 2020-07-17 NOTE — Progress Notes (Signed)
Walter Reynolds. 49 y.o.   Chief Complaint  Patient presents with   Diabetes    3 m f/u    Medication Management    Glipizide-- having side effects from Med. SHOB and shaky     HISTORY OF PRESENT ILLNESS: This is a 49 y.o. male with history of hypertension, diabetes, and dyslipidemia here for 70-month diabetes follow-up. Presently on Metformin 1000 mg twice a day.  Normal renal function.  Did not tolerate glipizide. History of hypertension on lisinopril 20 mg daily.  Recent echo cardiogram done last June as follows: IMPRESSIONS    1. Left ventricular ejection fraction, by estimation, is 45 to 50%. The  left ventricle has mildly decreased function. The left ventricle  demonstrates global hypokinesis. There is mild left ventricular  hypertrophy. Left ventricular diastolic parameters  are consistent with Grade I diastolic dysfunction (impaired relaxation).  2. Right ventricular systolic function is normal. The right ventricular  size is normal.  3. The mitral valve is normal in structure. No evidence of mitral valve  regurgitation. No evidence of mitral stenosis.  4. The aortic valve is normal in structure. Aortic valve regurgitation is  not visualized. No aortic stenosis is present.  5. The inferior vena cava is normal in size with greater than 50%  respiratory variability, suggesting right atrial pressure of 3 mmHg.   Has follow-up appointment with cardiologist in October. History of dyslipidemia on atorvastatin 20 mg daily. Takes 1 baby aspirin a day. Fully vaccinated against Covid.  HPI   Prior to Admission medications   Medication Sig Start Date End Date Taking? Authorizing Provider  aspirin EC 81 MG EC tablet Take 1 tablet (81 mg total) by mouth daily. 04/02/20  Yes Albertine Grates, MD  atorvastatin (LIPITOR) 20 MG tablet Take 2 tablets (40 mg total) by mouth daily. 04/01/20  Yes Albertine Grates, MD  fluticasone (FLONASE) 50 MCG/ACT nasal spray SPRAY 2 SPRAYS INTO EACH NOSTRIL  EVERY DAY Patient taking differently: Place 2 sprays into both nostrils daily.  09/14/19  Yes Arriana Lohmann, Eilleen Kempf, MD  lisinopril (ZESTRIL) 20 MG tablet Take 1 tablet (20 mg total) by mouth daily. 09/27/19 07/17/20 Yes Andra Matsuo, Eilleen Kempf, MD  metFORMIN (GLUCOPHAGE) 1000 MG tablet Take 1 tablet (1,000 mg total) by mouth 2 (two) times daily with a meal. 09/27/19  Yes Jermani Eberlein, Eilleen Kempf, MD  glipiZIDE (GLUCOTROL) 5 MG tablet Take 1 tablet (5 mg total) by mouth daily with breakfast. Patient not taking: Reported on 07/17/2020 04/13/20 07/12/20  Georgina Quint, MD    Allergies  Allergen Reactions   Glipizide Shortness Of Breath    Shakey    Doxycycline Hives   Erythromycin Base Hives    Patient Active Problem List   Diagnosis Date Noted   Body mass index (BMI) of 40.1-44.9 in adult [facility]) 07/17/2020   Hypertension associated with diabetes (HCC) 09/27/2019   Class 3 severe obesity with serious comorbidity and body mass index (BMI) of 40.0 to 44.9 in adult [facility]) 09/27/2019   Dyslipidemia 09/09/2018   Environmental allergies 09/09/2018   BMI 39.0-39.9,adult 09/03/2017   Dyslipidemia associated with type 2 diabetes mellitus (HCC) 09/03/2017   Anemia 04/14/2017   Hyperlipidemia 05/02/2016   OSA (obstructive sleep apnea) 09/06/2015   Benign essential HTN 09/06/2015    Past Medical History:  Diagnosis Date   Allergy    Diabetes mellitus without complication (HCC)    Elevated cholesterol    Hernia    bi lateral hernia repair   History  of kidney stones    Hypertension    Sleep apnea 2014   severe   Umbilical hernia without obstruction and without gangrene 09/03/2017    Past Surgical History:  Procedure Laterality Date   CARPAL TUNNEL RELEASE Right 2007   CARPAL TUNNEL RELEASE Left 10/28/2014   Procedure: LEFT CARPAL TUNNEL RELEASE;  Surgeon: Dairl Ponder, MD;  Location: [facility];  Service: Orthopedics;  Laterality: Left;   FOOT  SURGERY Left    INGUINAL HERNIA REPAIR     times two, each side   KIDNEY STONE SURGERY     MASS EXCISION Left 10/28/2014   Procedure: LEFT WRIST VOLAR MASS EXCISION;  Surgeon: Dairl Ponder, MD;  Location: [facility];  Service: Orthopedics;  Laterality: Left;   UMBILICAL HERNIA REPAIR N/A 10/27/2017   Procedure: HERNIA REPAIR UMBILICAL ADULT;  Surgeon: Berna Bue, MD;  Location: [facility];  Service: General;  Laterality: N/A;    Social History   Socioeconomic History   Marital status: Married    Spouse name: KDYN VONBEHREN   Number of children: 2   Years of education: Associates   Highest education level: Not on file  Occupational History   Occupation: Merchandiser, retail    Comment: [employer]  Tobacco Use   Smoking status: Never Smoker   Smokeless tobacco: Never Used  Building services engineer Use: Never used  Substance and Sexual Activity   Alcohol use: No    Alcohol/week: 0.0 standard drinks   Drug use: No   Sexual activity: Yes    Partners: Female    Birth control/protection: Condom  Other Topics Concern   Not on file  Social History Narrative   Lives with his wife and their 2 children.   He has 3 sons from a previous relationship that live independently.   Formerly in the [employer].   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   Social Determinants of Health   Financial Resource Strain:    Difficulty of Paying Living Expenses:   Food Insecurity:    Worried About Programme researcher, broadcasting/film/video in the Last Year:    Barista in the Last Year:   Transportation Needs:    Freight forwarder (Medical):    Lack of Transportation (Non-Medical):   Physical Activity:    Days of Exercise per Week:    Minutes of Exercise per Session:   Stress:    Feeling of Stress :   Social Connections:    Frequency of Communication with Friends and Family:     Frequency of Social Gatherings with Friends and Family:    Attends Religious Services:    Active Member of Clubs or Organizations:    Attends Engineer, structural:    Marital Status:   Intimate Partner Violence:    Fear of Current or Ex-Partner:    Emotionally Abused:    Physically Abused:    Sexually Abused:     Family History  Problem Relation Age of Onset   Diabetes Mother    Stroke Mother        4 strokes   Heart attack Mother    Heart disease Mother    Hypertension Father    Hypertension Sister    Heart disease Sister        pacemaker     Review of Systems  Constitutional: Negative.  Negative for chills and fever.  HENT: Negative.  Negative for congestion and sore throat.   Respiratory: Negative.  Negative for shortness of breath.   Cardiovascular: Negative.  Negative for chest pain and palpitations.  Gastrointestinal: Negative.  Negative for abdominal pain, diarrhea, nausea and vomiting.  Genitourinary: Negative.  Negative for dysuria and hematuria.  Musculoskeletal: Negative.  Negative for myalgias and neck pain.  Skin: Negative.  Negative for rash.  Neurological: Negative.  Negative for dizziness and headaches.  All other systems reviewed and are negative.  Vitals:   07/17/20 0932  BP: (!) 130/84  Pulse: 81  Temp: 97.7 F (36.5 C)  SpO2: 96%     Physical Exam Vitals reviewed.  Constitutional:      Appearance: Normal appearance. He is obese.  HENT:     Head: Normocephalic.  Eyes:     Extraocular Movements: Extraocular movements intact.     Conjunctiva/sclera: Conjunctivae normal.     Pupils: Pupils are equal, round, and reactive to light.  Cardiovascular:     Rate and Rhythm: Normal rate and regular rhythm.     Pulses: Normal pulses.     Heart sounds: Normal heart sounds.  Pulmonary:     Effort: Pulmonary effort is normal.     Breath sounds: Normal breath sounds.  Musculoskeletal:        General: Normal range of motion.       Cervical back: Normal range of motion and neck supple.  Skin:    General: Skin is warm and dry.     Capillary Refill: Capillary refill takes less than 2 seconds.  Neurological:     General: No focal deficit present.     Mental Status: He is alert and oriented to person, place, and time.  Psychiatric:        Mood and Affect: Mood normal.        Behavior: Behavior normal.    A total of 30 minutes was spent with the patient, greater than 50% of which was in counseling/coordination of care regarding diabetes and hypertension, treatment and management, cardiovascular risks associated with these conditions, review of all medications including new medication Farxiga, diet and nutrition, importance of increasing physical exercise, review of most recent blood work results including today's hemoglobin A1c, review of recent echocardiogram, review of most recent office visit notes, prognosis and need for follow-up in 3 months..   ASSESSMENT & PLAN: Hypertension associated with diabetes (HCC) Well-controlled hypertension.  Continue lisinopril 20 mg daily. Hemoglobin A1c same as before at 7.3.  Did not tolerate glipizide.  Continue Metformin and start Farxiga 5 mg daily.  Has normal renal function. Continue atorvastatin daily baby aspirin.  Diet and nutrition discussed. Follow-up in 3 months. Cai was seen today for diabetes and medication management.  Diagnoses and all orders for this visit:  Hypertension associated with diabetes (HCC) -     POCT glucose (manual entry) -     POCT glycosylated hemoglobin (Hb A1C) -     Microalbumin / creatinine urine ratio -     dapagliflozin propanediol (FARXIGA) 5 MG TABS tablet; Take 1 tablet (5 mg total) by mouth daily before breakfast.  Dyslipidemia associated with type 2 diabetes mellitus (HCC)  OSA (obstructive sleep apnea) -     Ambulatory referral to Pulmonology  Body mass index (BMI) of 40.1-44.9 in adult [facility])    Patient Instructions        If you have lab work done today you will be contacted with your lab results within the next 2  weeks.  If you have not heard from [place] then please contact us. The fastest way to get your results is to register for My Chart.   IF you received an x-ray today, you will receive an invoice from [facility]. Please contact [facility] at [phone] with questions or concerns regarding your invoice.   IF you received labwork today, you will receive an invoice from [place]. Please contact [facility] at [phone] with questions or concerns regarding your invoice.   Our billing staff will not be able to assist you with questions regarding bills from these companies.  You will be contacted with the lab results as soon as they are available. The fastest way to get your results is to activate your My Chart account. Instructions are located on the last page of this paperwork. If you have not heard from [place] regarding the results in 2 weeks, please contact this office.      Hypertension, Adult High blood pressure (hypertension) is when the force of blood pumping through the arteries is too strong. The arteries are the blood vessels that carry blood from the heart throughout the body. Hypertension forces the heart to work harder to pump blood and may cause arteries to become narrow or stiff. Untreated or uncontrolled hypertension can cause a heart attack, heart failure, a stroke, kidney disease, and other problems. A blood pressure reading consists of a higher number over a lower number. Ideally, your blood pressure should be below 120/80. The first ("top") number is called the systolic pressure. It is a measure of the pressure in your arteries as your heart beats. The second ("bottom") number is called the diastolic pressure. It is a measure of the pressure in your arteries as the heart relaxes. What are the causes? The exact cause of this condition is not known. There are some  conditions that result in or are related to high blood pressure. What increases the risk? Some risk factors for high blood pressure are under your control. The following factors may make you more likely to develop this condition:  Smoking.  Having type 2 diabetes mellitus, high cholesterol, or both.  Not getting enough exercise or physical activity.  Being overweight.  Having too much fat, sugar, calories, or salt (sodium) in your diet.  Drinking too much alcohol. Some risk factors for high blood pressure may be difficult or impossible to change. Some of these factors include:  Having chronic kidney disease.  Having a family history of high blood pressure.  Age. Risk increases with age.  Race. You may be at higher risk if you are African American.  Gender. Men are at higher risk than women before age 13. After age 18, women are at higher risk than men.  Having obstructive sleep apnea.  Stress. What are the signs or symptoms? High blood pressure may not cause symptoms. Very high blood pressure (hypertensive crisis) may cause:  Headache.  Anxiety.  Shortness of breath.  Nosebleed.  Nausea and vomiting.  Vision changes.  Severe chest pain.  Seizures. How is this diagnosed? This condition is diagnosed by measuring your blood pressure while you are seated, with your arm resting on a flat surface, your legs uncrossed, and your feet flat on the floor. The cuff of the blood pressure monitor will be placed directly against the skin of your upper arm at the level of your heart. It should be measured at least twice using the same arm. Certain conditions can cause a difference in blood pressure between  your right and left arms. Certain factors can cause blood pressure readings to be lower or higher than normal for a short period of time:  When your blood pressure is higher when you are in a health care provider's office than when you are at home, this is called white coat  hypertension. Most people with this condition do not need medicines.  When your blood pressure is higher at home than when you are in a health care provider's office, this is called masked hypertension. Most people with this condition may need medicines to control blood pressure. If you have a high blood pressure reading during one visit or you have normal blood pressure with other risk factors, you may be asked to:  Return on a different day to have your blood pressure checked again.  Monitor your blood pressure at home for 1 week or longer. If you are diagnosed with hypertension, you may have other blood or imaging tests to help your health care provider understand your overall risk for other conditions. How is this treated? This condition is treated by making healthy lifestyle changes, such as eating healthy foods, exercising more, and reducing your alcohol intake. Your health care provider may prescribe medicine if lifestyle changes are not enough to get your blood pressure under control, and if:  Your systolic blood pressure is above 130.  Your diastolic blood pressure is above 80. Your personal target blood pressure may vary depending on your medical conditions, your age, and other factors. Follow these instructions at home: Eating and drinking   Eat a diet that is high in fiber and potassium, and low in sodium, added sugar, and fat. An example eating plan is called the DASH (Dietary Approaches to Stop Hypertension) diet. To eat this way: ? Eat plenty of fresh fruits and vegetables. Try to fill one half of your plate at each meal with fruits and vegetables. ? Eat whole grains, such as whole-wheat pasta, brown rice, or whole-grain bread. Fill about one fourth of your plate with whole grains. ? Eat or drink low-fat dairy products, such as skim milk or low-fat yogurt. ? Avoid fatty cuts of meat, processed or cured meats, and poultry with skin. Fill about one fourth of your plate with lean  proteins, such as fish, chicken without skin, beans, eggs, or tofu. ? Avoid pre-made and processed foods. These tend to be higher in sodium, added sugar, and fat.  Reduce your daily sodium intake. Most people with hypertension should eat less than 1,500 mg of sodium a day.  Do not drink alcohol if: ? Your health care provider tells you not to drink. ? You are pregnant, may be pregnant, or are planning to become pregnant.  If you drink alcohol: ? Limit how much you use to:  0-1 drink a day for women.  0-2 drinks a day for men. ? Be aware of how much alcohol is in your drink. In the [place] one drink equals one 12 oz bottle of beer (355 mL), one 5 oz glass of wine (148 mL), or one 1 oz glass of hard liquor (44 mL). Lifestyle   Work with your health care provider to maintain a healthy body weight or to lose weight. Ask what an ideal weight is for you.  Get at least 30 minutes of exercise most days of the week. Activities may include walking, swimming, or biking.  Include exercise to strengthen your muscles (resistance exercise), such as Pilates or lifting weights, as part of your weekly  exercise routine. Try to do these types of exercises for 30 minutes at least 3 days a week.  Do not use any products that contain nicotine or tobacco, such as cigarettes, e-cigarettes, and chewing tobacco. If you need help quitting, ask your health care provider.  Monitor your blood pressure at home as told by your health care provider.  Keep all follow-up visits as told by your health care provider. This is important. Medicines  Take over-the-counter and prescription medicines only as told by your health care provider. Follow directions carefully. Blood pressure medicines must be taken as prescribed.  Do not skip doses of blood pressure medicine. Doing this puts you at risk for problems and can make the medicine less effective.  Ask your health care provider about side effects or reactions to  medicines that you should watch for. Contact a health care provider if you:  Think you are having a reaction to a medicine you are taking.  Have headaches that keep coming back (recurring).  Feel dizzy.  Have swelling in your ankles.  Have trouble with your vision. Get help right away if you:  Develop a severe headache or confusion.  Have unusual weakness or numbness.  Feel faint.  Have severe pain in your chest or abdomen.  Vomit repeatedly.  Have trouble breathing. Summary  Hypertension is when the force of blood pumping through your arteries is too strong. If this condition is not controlled, it may put you at risk for serious complications.  Your personal target blood pressure may vary depending on your medical conditions, your age, and other factors. For most people, a normal blood pressure is less than 120/80.  Hypertension is treated with lifestyle changes, medicines, or a combination of both. Lifestyle changes include losing weight, eating a healthy, low-sodium diet, exercising more, and limiting alcohol. This information is not intended to replace advice given to you by your health care provider. Make sure you discuss any questions you have with your health care provider. Document Revised: 08/12/2018 Document Reviewed: 08/12/2018 Elsevier Patient Education  2020 [employer].  Diabetes Mellitus and Nutrition, Adult When you have diabetes (diabetes mellitus), it is very important to have healthy eating habits because your blood sugar (glucose) levels are greatly affected by what you eat and drink. Eating healthy foods in the appropriate amounts, at about the same times every day, can help you:  Control your blood glucose.  Lower your risk of heart disease.  Improve your blood pressure.  Reach or maintain a healthy weight. Every person with diabetes is different, and each person has different needs for a meal plan. Your health care provider may recommend that you  work with a diet and nutrition specialist (dietitian) to make a meal plan that is best for you. Your meal plan may vary depending on factors such as:  The calories you need.  The medicines you take.  Your weight.  Your blood glucose, blood pressure, and cholesterol levels.  Your activity level.  Other health conditions you have, such as heart or kidney disease. How do carbohydrates affect me? Carbohydrates, also called carbs, affect your blood glucose level more than any other type of food. Eating carbs naturally raises the amount of glucose in your blood. Carb counting is a method for keeping track of how many carbs you eat. Counting carbs is important to keep your blood glucose at a healthy level, especially if you use insulin or take certain oral diabetes medicines. It is important to know how many  carbs you can safely have in each meal. This is different for every person. Your dietitian can help you calculate how many carbs you should have at each meal and for each snack. Foods that contain carbs include:  Bread, cereal, rice, pasta, and crackers.  Potatoes and corn.  Peas, beans, and lentils.  Milk and yogurt.  Fruit and juice.  Desserts, such as cakes, cookies, ice cream, and candy. How does alcohol affect me? Alcohol can cause a sudden decrease in blood glucose (hypoglycemia), especially if you use insulin or take certain oral diabetes medicines. Hypoglycemia can be a life-threatening condition. Symptoms of hypoglycemia (sleepiness, dizziness, and confusion) are similar to symptoms of having too much alcohol. If your health care provider says that alcohol is safe for you, follow these guidelines:  Limit alcohol intake to no more than 1 drink per day for nonpregnant women and 2 drinks per day for men. One drink equals 12 oz of beer, 5 oz of wine, or 1 oz of hard liquor.  Do not drink on an empty stomach.  Keep yourself hydrated with water, diet soda, or unsweetened iced  tea.  Keep in mind that regular soda, juice, and other mixers may contain a lot of sugar and must be counted as carbs. What are tips for following this plan?  Reading food labels  Start by checking the serving size on the "Nutrition Facts" label of packaged foods and drinks. The amount of calories, carbs, fats, and other nutrients listed on the label is based on one serving of the item. Many items contain more than one serving per package.  Check the total grams (g) of carbs in one serving. You can calculate the number of servings of carbs in one serving by dividing the total carbs by 15. For example, if a food has 30 g of total carbs, it would be equal to 2 servings of carbs.  Check the number of grams (g) of saturated and trans fats in one serving. Choose foods that have low or no amount of these fats.  Check the number of milligrams (mg) of salt (sodium) in one serving. Most people should limit total sodium intake to less than 2,300 mg per day.  Always check the nutrition information of foods labeled as "low-fat" or "nonfat". These foods may be higher in added sugar or refined carbs and should be avoided.  Talk to your dietitian to identify your daily goals for nutrients listed on the label. Shopping  Avoid buying canned, premade, or processed foods. These foods tend to be high in fat, sodium, and added sugar.  Shop around the outside edge of the grocery store. This includes fresh fruits and vegetables, bulk grains, fresh meats, and fresh dairy. Cooking  Use low-heat cooking methods, such as baking, instead of high-heat cooking methods like deep frying.  Cook using healthy oils, such as olive, canola, or sunflower oil.  Avoid cooking with butter, cream, or high-fat meats. Meal planning  Eat meals and snacks regularly, preferably at the same times every day. Avoid going long periods of time without eating.  Eat foods high in fiber, such as fresh fruits, vegetables, beans, and  whole grains. Talk to your dietitian about how many servings of carbs you can eat at each meal.  Eat 4-6 ounces (oz) of lean protein each day, such as lean meat, chicken, fish, eggs, or tofu. One oz of lean protein is equal to: ? 1 oz of meat, chicken, or fish. ? 1 egg. ?  cup of tofu.  Eat some foods each day that contain healthy fats, such as avocado, nuts, seeds, and fish. Lifestyle  Check your blood glucose regularly.  Exercise regularly as told by your health care provider. This may include: ? 150 minutes of moderate-intensity or vigorous-intensity exercise each week. This could be brisk walking, biking, or water aerobics. ? Stretching and doing strength exercises, such as yoga or weightlifting, at least 2 times a week.  Take medicines as told by your health care provider.  Do not use any products that contain nicotine or tobacco, such as cigarettes and e-cigarettes. If you need help quitting, ask your health care provider.  Work with a Veterinary surgeon or diabetes educator to identify strategies to manage stress and any emotional and social challenges. Questions to ask a health care provider  Do I need to meet with a diabetes educator?  Do I need to meet with a dietitian?  What number can I call if I have questions?  When are the best times to check my blood glucose? Where to find more information:  American Diabetes Association: diabetes.org  Academy of Nutrition and Dietetics: www.eatright.[employer] of Diabetes and Digestive and Kidney Diseases (NIH): CarFlippers.tn Summary  A healthy meal plan will help you control your blood glucose and maintain a healthy lifestyle.  Working with a diet and nutrition specialist (dietitian) can help you make a meal plan that is best for you.  Keep in mind that carbohydrates (carbs) and alcohol have immediate effects on your blood glucose levels. It is important to count carbs and to use alcohol carefully. This  information is not intended to replace advice given to you by your health care provider. Make sure you discuss any questions you have with your health care provider. Document Revised: 11/14/2017 Document Reviewed: 01/06/2017 Elsevier Patient Education  2020 Elsevier Inc.       Edwina Barth, MD [facility]

## 2020-07-17 NOTE — Assessment & Plan Note (Addendum)
Well-controlled hypertension.  Continue lisinopril 20 mg daily. Hemoglobin A1c same as before at 7.3.  Did not tolerate glipizide.  Continue Metformin and start Farxiga 5 mg daily.  Has normal renal function. Continue atorvastatin daily baby aspirin.  Diet and nutrition discussed. Follow-up in 3 months.

## 2020-07-17 NOTE — Patient Instructions (Addendum)
   If you have lab work done today you will be contacted with your lab results within the next 2 weeks.  If you have not heard from us then please contact us. The fastest way to get your results is to register for My Chart.   IF you received an x-ray today, you will receive an invoice from [facility]. Please contact [facility] at [phone] with questions or concerns regarding your invoice.   IF you received labwork today, you will receive an invoice from [facility]. Please contact [facility] at [phone] with questions or concerns regarding your invoice.   Our billing staff will not be able to assist you with questions regarding bills from these companies.  You will be contacted with the lab results as soon as they are available. The fastest way to get your results is to activate your My Chart account. Instructions are located on the last page of this paperwork. If you have not heard from us regarding the results in 2 weeks, please contact this office.      Hypertension, Adult High blood pressure (hypertension) is when the force of blood pumping through the arteries is too strong. The arteries are the blood vessels that carry blood from the heart throughout the body. Hypertension forces the heart to work harder to pump blood and may cause arteries to become narrow or stiff. Untreated or uncontrolled hypertension can cause a heart attack, heart failure, a stroke, kidney disease, and other problems. A blood pressure reading consists of a higher number over a lower number. Ideally, your blood pressure should be below 120/80. The first ("top") number is called the systolic pressure. It is a measure of the pressure in your arteries as your heart beats. The second ("bottom") number is called the diastolic pressure. It is a measure of the pressure in your arteries as the heart relaxes. What are the causes? The exact cause of this condition is not known. There are some conditions  that result in or are related to high blood pressure. What increases the risk? Some risk factors for high blood pressure are under your control. The following factors may make you more likely to develop this condition:  Smoking.  Having type 2 diabetes mellitus, high cholesterol, or both.  Not getting enough exercise or physical activity.  Being overweight.  Having too much fat, sugar, calories, or salt (sodium) in your diet.  Drinking too much alcohol. Some risk factors for high blood pressure may be difficult or impossible to change. Some of these factors include:  Having chronic kidney disease.  Having a family history of high blood pressure.  Age. Risk increases with age.  Race. You may be at higher risk if you are African American.  Gender. Men are at higher risk than women before age 45. After age 65, women are at higher risk than men.  Having obstructive sleep apnea.  Stress. What are the signs or symptoms? High blood pressure may not cause symptoms. Very high blood pressure (hypertensive crisis) may cause:  Headache.  Anxiety.  Shortness of breath.  Nosebleed.  Nausea and vomiting.  Vision changes.  Severe chest pain.  Seizures. How is this diagnosed? This condition is diagnosed by measuring your blood pressure while you are seated, with your arm resting on a flat surface, your legs uncrossed, and your feet flat on the floor. The cuff of the blood pressure monitor will be placed directly against the skin of your upper arm at the level of your   heart. It should be measured at least twice using the same arm. Certain conditions can cause a difference in blood pressure between your right and left arms. Certain factors can cause blood pressure readings to be lower or higher than normal for a short period of time:  When your blood pressure is higher when you are in a health care provider's office than when you are at home, this is called white coat hypertension.  Most people with this condition do not need medicines.  When your blood pressure is higher at home than when you are in a health care provider's office, this is called masked hypertension. Most people with this condition may need medicines to control blood pressure. If you have a high blood pressure reading during one visit or you have normal blood pressure with other risk factors, you may be asked to:  Return on a different day to have your blood pressure checked again.  Monitor your blood pressure at home for 1 week or longer. If you are diagnosed with hypertension, you may have other blood or imaging tests to help your health care provider understand your overall risk for other conditions. How is this treated? This condition is treated by making healthy lifestyle changes, such as eating healthy foods, exercising more, and reducing your alcohol intake. Your health care provider may prescribe medicine if lifestyle changes are not enough to get your blood pressure under control, and if:  Your systolic blood pressure is above 130.  Your diastolic blood pressure is above 80. Your personal target blood pressure may vary depending on your medical conditions, your age, and other factors. Follow these instructions at home: Eating and drinking   Eat a diet that is high in fiber and potassium, and low in sodium, added sugar, and fat. An example eating plan is called the DASH (Dietary Approaches to Stop Hypertension) diet. To eat this way: ? Eat plenty of fresh fruits and vegetables. Try to fill one half of your plate at each meal with fruits and vegetables. ? Eat whole grains, such as whole-wheat pasta, brown rice, or whole-grain bread. Fill about one fourth of your plate with whole grains. ? Eat or drink low-fat dairy products, such as skim milk or low-fat yogurt. ? Avoid fatty cuts of meat, processed or cured meats, and poultry with skin. Fill about one fourth of your plate with lean proteins, such  as fish, chicken without skin, beans, eggs, or tofu. ? Avoid pre-made and processed foods. These tend to be higher in sodium, added sugar, and fat.  Reduce your daily sodium intake. Most people with hypertension should eat less than 1,500 mg of sodium a day.  Do not drink alcohol if: ? Your health care provider tells you not to drink. ? You are pregnant, may be pregnant, or are planning to become pregnant.  If you drink alcohol: ? Limit how much you use to:  0-1 drink a day for women.  0-2 drinks a day for men. ? Be aware of how much alcohol is in your drink. In the [place] one drink equals one 12 oz bottle of beer (355 mL), one 5 oz glass of wine (148 mL), or one 1 oz glass of hard liquor (44 mL). Lifestyle   Work with your health care provider to maintain a healthy body weight or to lose weight. Ask what an ideal weight is for you.  Get at least 30 minutes of exercise most days of the week. Activities may include walking, swimming,   or biking.  Include exercise to strengthen your muscles (resistance exercise), such as Pilates or lifting weights, as part of your weekly exercise routine. Try to do these types of exercises for 30 minutes at least 3 days a week.  Do not use any products that contain nicotine or tobacco, such as cigarettes, e-cigarettes, and chewing tobacco. If you need help quitting, ask your health care provider.  Monitor your blood pressure at home as told by your health care provider.  Keep all follow-up visits as told by your health care provider. This is important. Medicines  Take over-the-counter and prescription medicines only as told by your health care provider. Follow directions carefully. Blood pressure medicines must be taken as prescribed.  Do not skip doses of blood pressure medicine. Doing this puts you at risk for problems and can make the medicine less effective.  Ask your health care provider about side effects or reactions to medicines that you  should watch for. Contact a health care provider if you:  Think you are having a reaction to a medicine you are taking.  Have headaches that keep coming back (recurring).  Feel dizzy.  Have swelling in your ankles.  Have trouble with your vision. Get help right away if you:  Develop a severe headache or confusion.  Have unusual weakness or numbness.  Feel faint.  Have severe pain in your chest or abdomen.  Vomit repeatedly.  Have trouble breathing. Summary  Hypertension is when the force of blood pumping through your arteries is too strong. If this condition is not controlled, it may put you at risk for serious complications.  Your personal target blood pressure may vary depending on your medical conditions, your age, and other factors. For most people, a normal blood pressure is less than 120/80.  Hypertension is treated with lifestyle changes, medicines, or a combination of both. Lifestyle changes include losing weight, eating a healthy, low-sodium diet, exercising more, and limiting alcohol. This information is not intended to replace advice given to you by your health care provider. Make sure you discuss any questions you have with your health care provider. Document Revised: 08/12/2018 Document Reviewed: 08/12/2018 Elsevier Patient Education  2020 Elsevier Inc.  Diabetes Mellitus and Nutrition, Adult When you have diabetes (diabetes mellitus), it is very important to have healthy eating habits because your blood sugar (glucose) levels are greatly affected by what you eat and drink. Eating healthy foods in the appropriate amounts, at about the same times every day, can help you:  Control your blood glucose.  Lower your risk of heart disease.  Improve your blood pressure.  Reach or maintain a healthy weight. Every person with diabetes is different, and each person has different needs for a meal plan. Your health care provider may recommend that you work with a diet  and nutrition specialist (dietitian) to make a meal plan that is best for you. Your meal plan may vary depending on factors such as:  The calories you need.  The medicines you take.  Your weight.  Your blood glucose, blood pressure, and cholesterol levels.  Your activity level.  Other health conditions you have, such as heart or kidney disease. How do carbohydrates affect me? Carbohydrates, also called carbs, affect your blood glucose level more than any other type of food. Eating carbs naturally raises the amount of glucose in your blood. Carb counting is a method for keeping track of how many carbs you eat. Counting carbs is important to keep your blood glucose   at a healthy level, especially if you use insulin or take certain oral diabetes medicines. It is important to know how many carbs you can safely have in each meal. This is different for every person. Your dietitian can help you calculate how many carbs you should have at each meal and for each snack. Foods that contain carbs include:  Bread, cereal, rice, pasta, and crackers.  Potatoes and corn.  Peas, beans, and lentils.  Milk and yogurt.  Fruit and juice.  Desserts, such as cakes, cookies, ice cream, and candy. How does alcohol affect me? Alcohol can cause a sudden decrease in blood glucose (hypoglycemia), especially if you use insulin or take certain oral diabetes medicines. Hypoglycemia can be a life-threatening condition. Symptoms of hypoglycemia (sleepiness, dizziness, and confusion) are similar to symptoms of having too much alcohol. If your health care provider says that alcohol is safe for you, follow these guidelines:  Limit alcohol intake to no more than 1 drink per day for nonpregnant women and 2 drinks per day for men. One drink equals 12 oz of beer, 5 oz of wine, or 1 oz of hard liquor.  Do not drink on an empty stomach.  Keep yourself hydrated with water, diet soda, or unsweetened iced tea.  Keep in  mind that regular soda, juice, and other mixers may contain a lot of sugar and must be counted as carbs. What are tips for following this plan?  Reading food labels  Start by checking the serving size on the "Nutrition Facts" label of packaged foods and drinks. The amount of calories, carbs, fats, and other nutrients listed on the label is based on one serving of the item. Many items contain more than one serving per package.  Check the total grams (g) of carbs in one serving. You can calculate the number of servings of carbs in one serving by dividing the total carbs by 15. For example, if a food has 30 g of total carbs, it would be equal to 2 servings of carbs.  Check the number of grams (g) of saturated and trans fats in one serving. Choose foods that have low or no amount of these fats.  Check the number of milligrams (mg) of salt (sodium) in one serving. Most people should limit total sodium intake to less than 2,300 mg per day.  Always check the nutrition information of foods labeled as "low-fat" or "nonfat". These foods may be higher in added sugar or refined carbs and should be avoided.  Talk to your dietitian to identify your daily goals for nutrients listed on the label. Shopping  Avoid buying canned, premade, or processed foods. These foods tend to be high in fat, sodium, and added sugar.  Shop around the outside edge of the grocery store. This includes fresh fruits and vegetables, bulk grains, fresh meats, and fresh dairy. Cooking  Use low-heat cooking methods, such as baking, instead of high-heat cooking methods like deep frying.  Cook using healthy oils, such as olive, canola, or sunflower oil.  Avoid cooking with butter, cream, or high-fat meats. Meal planning  Eat meals and snacks regularly, preferably at the same times every day. Avoid going long periods of time without eating.  Eat foods high in fiber, such as fresh fruits, vegetables, beans, and whole grains. Talk  to your dietitian about how many servings of carbs you can eat at each meal.  Eat 4-6 ounces (oz) of lean protein each day, such as lean meat, chicken, fish, eggs, or   tofu. One oz of lean protein is equal to: ? 1 oz of meat, chicken, or fish. ? 1 egg. ?  cup of tofu.  Eat some foods each day that contain healthy fats, such as avocado, nuts, seeds, and fish. Lifestyle  Check your blood glucose regularly.  Exercise regularly as told by your health care provider. This may include: ? 150 minutes of moderate-intensity or vigorous-intensity exercise each week. This could be brisk walking, biking, or water aerobics. ? Stretching and doing strength exercises, such as yoga or weightlifting, at least 2 times a week.  Take medicines as told by your health care provider.  Do not use any products that contain nicotine or tobacco, such as cigarettes and e-cigarettes. If you need help quitting, ask your health care provider.  Work with a counselor or diabetes educator to identify strategies to manage stress and any emotional and social challenges. Questions to ask a health care provider  Do I need to meet with a diabetes educator?  Do I need to meet with a dietitian?  What number can I call if I have questions?  When are the best times to check my blood glucose? Where to find more information:  American Diabetes Association: diabetes.org  Academy of Nutrition and Dietetics: www.eatright.org  National Institute of Diabetes and Digestive and Kidney Diseases (NIH): www.niddk.nih.gov Summary  A healthy meal plan will help you control your blood glucose and maintain a healthy lifestyle.  Working with a diet and nutrition specialist (dietitian) can help you make a meal plan that is best for you.  Keep in mind that carbohydrates (carbs) and alcohol have immediate effects on your blood glucose levels. It is important to count carbs and to use alcohol carefully. This information is not intended  to replace advice given to you by your health care provider. Make sure you discuss any questions you have with your health care provider. Document Revised: 11/14/2017 Document Reviewed: 01/06/2017 Elsevier Patient Education  2020 Elsevier Inc.  

## 2020-07-18 LAB — MICROALBUMIN / CREATININE URINE RATIO
Creatinine, Urine: 158.1 mg/dL
Microalb/Creat Ratio: 6 mg/g creat (ref 0–29)
Microalbumin, Urine: 9.1 ug/mL

## 2020-09-07 ENCOUNTER — Other Ambulatory Visit: Payer: Self-pay

## 2020-09-07 ENCOUNTER — Encounter (HOSPITAL_COMMUNITY): Payer: Self-pay | Admitting: Emergency Medicine

## 2020-09-07 ENCOUNTER — Emergency Department (HOSPITAL_COMMUNITY): Payer: BC Managed Care – PPO

## 2020-09-07 ENCOUNTER — Emergency Department (HOSPITAL_COMMUNITY)
Admission: EM | Admit: 2020-09-07 | Discharge: 2020-09-07 | Disposition: A | Discharge status: Home / Self Care | Payer: BC Managed Care – PPO | Attending: Emergency Medicine | Admitting: Emergency Medicine

## 2020-09-07 DIAGNOSIS — M545 Low back pain: Secondary | ICD-10-CM | POA: Diagnosis not present

## 2020-09-07 DIAGNOSIS — Y92410 Unspecified street and highway as the place of occurrence of the external cause: Secondary | ICD-10-CM | POA: Insufficient documentation

## 2020-09-07 DIAGNOSIS — Z7984 Long term (current) use of oral hypoglycemic drugs: Secondary | ICD-10-CM | POA: Insufficient documentation

## 2020-09-07 DIAGNOSIS — E785 Hyperlipidemia, unspecified: Secondary | ICD-10-CM | POA: Insufficient documentation

## 2020-09-07 DIAGNOSIS — I10 Essential (primary) hypertension: Secondary | ICD-10-CM | POA: Insufficient documentation

## 2020-09-07 DIAGNOSIS — Z7982 Long term (current) use of aspirin: Secondary | ICD-10-CM | POA: Diagnosis not present

## 2020-09-07 DIAGNOSIS — Y998 Other external cause status: Secondary | ICD-10-CM | POA: Insufficient documentation

## 2020-09-07 DIAGNOSIS — M19011 Primary osteoarthritis, right shoulder: Secondary | ICD-10-CM | POA: Diagnosis not present

## 2020-09-07 DIAGNOSIS — Z79899 Other long term (current) drug therapy: Secondary | ICD-10-CM | POA: Insufficient documentation

## 2020-09-07 DIAGNOSIS — Y93I9 Activity, other involving external motion: Secondary | ICD-10-CM | POA: Diagnosis not present

## 2020-09-07 DIAGNOSIS — M25511 Pain in right shoulder: Secondary | ICD-10-CM | POA: Insufficient documentation

## 2020-09-07 DIAGNOSIS — S39012A Strain of muscle, fascia and tendon of lower back, initial encounter: Secondary | ICD-10-CM

## 2020-09-07 DIAGNOSIS — E1169 Type 2 diabetes mellitus with other specified complication: Secondary | ICD-10-CM | POA: Insufficient documentation

## 2020-09-07 MED ORDER — CYCLOBENZAPRINE HCL 10 MG PO TABS
5.0000 mg | ORAL_TABLET | Freq: Once | ORAL | Status: AC
  Administered 2020-09-07: 5 mg via ORAL
  Filled 2020-09-07: qty 1

## 2020-09-07 MED ORDER — CYCLOBENZAPRINE HCL 5 MG PO TABS: 5.0000 mg | ORAL_TABLET | Freq: Three times a day (TID) | ORAL | 0 refills | Status: DC | PRN

## 2020-09-07 MED ORDER — IBUPROFEN 800 MG PO TABS: 800.0000 mg | ORAL_TABLET | Freq: Three times a day (TID) | ORAL | 0 refills | Status: DC

## 2020-09-07 MED ORDER — IBUPROFEN 800 MG PO TABS
800.0000 mg | ORAL_TABLET | Freq: Once | ORAL | Status: AC
  Administered 2020-09-07: 800 mg via ORAL
  Filled 2020-09-07: qty 1

## 2020-09-07 NOTE — ED Notes (Signed)
Patient verbalizes understanding of discharge instructions. Opportunity for questioning and answers were provided. Armband removed by staff, pt discharged from ED stable & ambulatory  
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
## 2020-09-07 NOTE — Discharge Instructions (Addendum)
Your x-rays show no fracture today.  Expect to be stiff and sore  Take Motrin and Flexeril.  Do not drive when you take Flexeril  See your doctor for follow  Return to ER if you have worse back pain, trouble walking, chest pain
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
## 2020-09-18 ENCOUNTER — Encounter: Payer: Self-pay | Admitting: Cardiology

## 2020-09-18 ENCOUNTER — Ambulatory Visit (INDEPENDENT_AMBULATORY_CARE_PROVIDER_SITE_OTHER): Payer: BC Managed Care – PPO | Admitting: Cardiology

## 2020-09-18 ENCOUNTER — Other Ambulatory Visit: Payer: Self-pay

## 2020-09-18 VITALS — BP 123/61 | HR 88 | Temp 97.3°F | Ht 67.0 in | Wt 256.6 lb

## 2020-09-18 DIAGNOSIS — I2510 Atherosclerotic heart disease of native coronary artery without angina pectoris: Secondary | ICD-10-CM | POA: Diagnosis not present

## 2020-09-18 DIAGNOSIS — R002 Palpitations: Secondary | ICD-10-CM

## 2020-09-18 DIAGNOSIS — I10 Essential (primary) hypertension: Secondary | ICD-10-CM

## 2020-09-18 DIAGNOSIS — E785 Hyperlipidemia, unspecified: Secondary | ICD-10-CM

## 2020-09-18 DIAGNOSIS — R911 Solitary pulmonary nodule: Secondary | ICD-10-CM

## 2020-09-18 DIAGNOSIS — I519 Heart disease, unspecified: Secondary | ICD-10-CM

## 2020-09-18 MED ORDER — METOPROLOL SUCCINATE ER 25 MG PO TB24: 25.0000 mg | ORAL_TABLET | Freq: Every day | ORAL | 1 refills | Status: DC

## 2020-09-18 NOTE — Patient Instructions (Signed)
Medication Instructions:  START metoprolol succinate (Toprol XL) 25 mg daily  *If you need a refill on your cardiac medications before your next appointment, please call your pharmacy*  Testing/Procedures: Your physician has requested that you have a cardiac MRI. Cardiac MRI uses a computer to create images of your heart as its beating, producing both still and moving pictures of your heart and major blood vessels. For further information please visit [email or web address]. Please follow the instruction sheet given to you today for more information. --this must be approved by insurance prior to scheduling  Follow-Up: At [facility], you and your health needs are our priority.  As part of our continuing mission to provide you with exceptional heart care, we have created designated Provider Care Teams.  These Care Teams include your primary Cardiologist (physician) and Advanced Practice Providers (APPs -  Physician Assistants and Nurse Practitioners) who all work together to provide you with the care you need, when you need it.  We recommend signing up for the patient portal called "MyChart".  Sign up information is provided on this After Visit Summary.  MyChart is used to connect with patients for Virtual Visits (Telemedicine).  Patients are able to view lab/test results, encounter notes, upcoming appointments, etc.  Non-urgent messages can be sent to your provider as well.   To learn more about what you can do with MyChart, go to [email or web address].    Your next appointment:   3 month(s)  The format for your next appointment:   In Person  Provider:   Epifanio Lesches, MD

## 2020-09-20 ENCOUNTER — Encounter: Payer: Self-pay | Admitting: Pulmonary Disease

## 2020-09-20 ENCOUNTER — Ambulatory Visit (INDEPENDENT_AMBULATORY_CARE_PROVIDER_SITE_OTHER): Payer: BC Managed Care – PPO | Admitting: Pulmonary Disease

## 2020-09-20 ENCOUNTER — Other Ambulatory Visit: Payer: Self-pay

## 2020-09-20 VITALS — BP 122/78 | HR 80 | Temp 97.3°F | Ht 67.0 in | Wt 258.0 lb

## 2020-09-20 DIAGNOSIS — G4733 Obstructive sleep apnea (adult) (pediatric): Secondary | ICD-10-CM | POA: Diagnosis not present

## 2020-09-20 NOTE — Progress Notes (Signed)
Walter Reynolds    [id number]    August 11, 1971  Primary Care Physician:Sagardia, Eilleen Kempf, MD  Referring Physician: Georgina Quint, MD [address],  [place] [address]  Chief complaint:   Patient with a history of obstructive sleep apnea diagnosed about 5 to 6 years ago  HPI:  Sleep apnea diagnosed about 5 to 6 years ago Uses CPAP on a regular basis  Still has daytime sleepiness Can easily fall asleep being a passenger in a car Uses CPAP regularly  Usually goes to bed between 9 and 11 PM Falls asleep easily About 1-2 awakenings Final wake up time between 6 and 7 AM  Weight has remained stable about 5 to 10 pounds up the last couple years  He does not know his current CPAP settings  Does have a history of hypertension, diabetes, hypercholesterolemia  Does not smoke Does not use alcohol  Admits to dry mouth in the mornings No morning headaches Denies excessive sweating at night  Dad has obstructive sleep apnea  Outpatient Encounter Medications as of 09/20/2020  Medication Sig  . aspirin EC 81 MG EC tablet Take 1 tablet (81 mg total) by mouth daily.  Marland Kitchen atorvastatin (LIPITOR) 20 MG tablet Take 2 tablets (40 mg total) by mouth daily.  . cyclobenzaprine (FLEXERIL) 5 MG tablet Take 1 tablet (5 mg total) by mouth 3 (three) times daily as needed.  . fluticasone (FLONASE) 50 MCG/ACT nasal spray SPRAY 2 SPRAYS INTO EACH NOSTRIL EVERY DAY (Patient taking differently: Place 2 sprays into both nostrils daily. )  . ibuprofen (ADVIL) 800 MG tablet Take 1 tablet (800 mg total) by mouth 3 (three) times daily.  . metFORMIN (GLUCOPHAGE) 1000 MG tablet Take 1 tablet (1,000 mg total) by mouth 2 (two) times daily with a meal.  . metoprolol succinate (TOPROL XL) 25 MG 24 hr tablet Take 1 tablet (25 mg total) by mouth daily.  Marland Kitchen lisinopril (ZESTRIL) 20 MG tablet Take 1 tablet (20 mg total) by mouth daily.   No facility-administered encounter medications on file as  of 09/20/2020.    Allergies as of 09/20/2020 - Review Complete 09/20/2020  Allergen Reaction Noted  . Glipizide Shortness Of Breath 07/17/2020  . Doxycycline Hives 06/01/2015  . Erythromycin base Hives 10/16/2012    Past Medical History:  Diagnosis Date  . Allergy   . Diabetes mellitus without complication (HCC)   . Elevated cholesterol   . Hernia    bi lateral hernia repair  . History of kidney stones   . Hypertension   . Sleep apnea 2014   severe  . Umbilical hernia without obstruction and without gangrene 09/03/2017    Past Surgical History:  Procedure Laterality Date  . CARPAL TUNNEL RELEASE Right 2007  . CARPAL TUNNEL RELEASE Left 10/28/2014   Procedure: LEFT CARPAL TUNNEL RELEASE;  Surgeon: Dairl Ponder, MD;  Location: [facility];  Service: Orthopedics;  Laterality: Left;  . FOOT SURGERY Left   . INGUINAL HERNIA REPAIR     times two, each side  . KIDNEY STONE SURGERY    . MASS EXCISION Left 10/28/2014   Procedure: LEFT WRIST VOLAR MASS EXCISION;  Surgeon: Dairl Ponder, MD;  Location: [facility];  Service: Orthopedics;  Laterality: Left;  . UMBILICAL HERNIA REPAIR N/A 10/27/2017   Procedure: HERNIA REPAIR UMBILICAL ADULT;  Surgeon: Berna Bue, MD;  Location: [facility] OR;  Service: General;  Laterality: N/A;    Family  History  Problem Relation Age of Onset  . Diabetes Mother   . Stroke Mother        4 strokes  . Heart attack Mother   . Heart disease Mother   . Hypertension Father   . Hypertension Sister   . Heart disease Sister        pacemaker    Social History   Socioeconomic History  . Marital status: Married    Spouse name: Walter Reynolds  . Number of children: 2  . Years of education: Associates  . Highest education level: Not on file  Occupational History  . Occupation: Merchandiser, retail    Comment: [employer]  Tobacco Use  . Smoking status: Never Smoker  . Smokeless tobacco: Never Used  Vaping Use  . Vaping  Use: Never used  Substance and Sexual Activity  . Alcohol use: No    Alcohol/week: 0.0 standard drinks  . Drug use: No  . Sexual activity: Yes    Partners: Female    Birth control/protection: Condom  Other Topics Concern  . Not on file  Social History Narrative   Lives with his wife and their 2 children.   He has 3 sons from a previous relationship that live independently.   Formerly in the [employer].   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   Social Determinants of Health   Financial Resource Strain:   . Difficulty of Paying Living Expenses: Not on file  Food Insecurity:   . Worried About Programme researcher, broadcasting/film/video in the Last Year: Not on file  . Ran Out of Food in the Last Year: Not on file  Transportation Needs:   . Lack of Transportation (Medical): Not on file  . Lack of Transportation (Non-Medical): Not on file  Physical Activity:   . Days of Exercise per Week: Not on file  . Minutes of Exercise per Session: Not on file  Stress:   . Feeling of Stress : Not on file  Social Connections:   . Frequency of Communication with Friends and Family: Not on file  . Frequency of Social Gatherings with Friends and Family: Not on file  . Attends Religious Services: Not on file  . Active Member of Clubs or Organizations: Not on file  . Attends Banker Meetings: Not on file  . Marital Status: Not on file  Intimate Partner Violence:   . Fear of Current or Ex-Partner: Not on file  . Emotionally Abused: Not on file  . Physically Abused: Not on file  . Sexually Abused: Not on file    Review of Systems  Constitutional: Positive for fatigue.  HENT: Negative.   Respiratory: Positive for apnea.   Cardiovascular: Negative.   Psychiatric/Behavioral: Positive for sleep disturbance.  All other systems reviewed and are negative.   Vitals:   09/20/20 1004  BP: 122/78  Pulse: 80  Temp: (!)  97.3 F (36.3 C)  SpO2: 95%     Physical Exam Constitutional:      Appearance: He is obese.  HENT:     Head: Normocephalic.     Mouth/Throat:     Mouth: Mucous membranes are moist.     Pharynx: No oropharyngeal exudate or posterior oropharyngeal erythema.     Comments: Macroglossia, crowded oropharynx, Mallampati 4 Eyes:     General:        Right eye: No discharge.  Left eye: No discharge.     Pupils: Pupils are equal, round, and reactive to light.  Cardiovascular:     Rate and Rhythm: Normal rate and regular rhythm.     Pulses: Normal pulses.     Heart sounds: Normal heart sounds. No murmur heard.  No friction rub.  Pulmonary:     Effort: Pulmonary effort is normal. No respiratory distress.     Breath sounds: Normal breath sounds. No stridor. No wheezing or rhonchi.  Musculoskeletal:     Cervical back: No rigidity or tenderness.  Neurological:     Mental Status: He is alert.  Psychiatric:        Mood and Affect: Mood normal.    Previous study not available for review Results of the Epworth flowsheet 09/20/2020  Sitting and reading 0  Watching TV 0  Sitting, inactive in a public place (e.g. a theatre or a meeting) 0  As a passenger in a car for an hour without a break 3  Lying down to rest in the afternoon when circumstances permit 0  Sitting and talking to someone 0  Sitting quietly after a lunch without alcohol 0  In a car, while stopped for a few minutes in traffic 1  Total score 4   Data Reviewed: Recent echo with ejection fraction of 45 to 50% with global hypokinesis on the left, diastolic dysfunction normal right ventricular function.  Assessment:  History of obstructive sleep apnea -Severity is unknown at present -Compliant with CPAP use but does not feel it is working as well  Has daytime sleepiness, daytime fatigue -Though epworth is only 4, he does describe significant sleepiness,  fall asleep easily if he is a passenger in a car  The  physiology of sleep disordered breathing discussed Treatment options discussed  Importance of weight management discussed  Regular exercises and diet encouraged  Plan/Recommendations: We will schedule patient for a home sleep study to ascertain severity of sleep disordered breathing  Will benefit from auto titrating CPAP therapy  Importance of weight management  Risk of not treating significant sleep disordered breathing discussed  I will see him in 3 months   Virl Diamond MD [facility] 09/20/2020, 10:42 AM  CC: Georgina Quint, *

## 2020-09-20 NOTE — Patient Instructions (Signed)
Obstructive sleep apnea  We will schedule you for home sleep study Update your results as they become available  Set you up with auto titrating CPAP following results  Continue using your current CPAP  Call with significant concerns  Regular exercise for weight loss Diet change for weight loss  I will follow-up with you in 3 months Sleep Apnea Sleep apnea affects breathing during sleep. It causes breathing to stop for a short time or to become shallow. It can also increase the risk of:  Heart attack.  Stroke.  Being very overweight (obese).  Diabetes.  Heart failure.  Irregular heartbeat. The goal of treatment is to help you breathe normally again. What are the causes? There are three kinds of sleep apnea:  Obstructive sleep apnea. This is caused by a blocked or collapsed airway.  Central sleep apnea. This happens when the brain does not send the right signals to the muscles that control breathing.  Mixed sleep apnea. This is a combination of obstructive and central sleep apnea. The most common cause of this condition is a collapsed or blocked airway. This can happen if:  Your throat muscles are too relaxed.  Your tongue and tonsils are too large.  You are overweight.  Your airway is too small. What increases the risk?  Being overweight.  Smoking.  Having a small airway.  Being older.  Being male.  Drinking alcohol.  Taking medicines to calm yourself (sedatives or tranquilizers).  Having family members with the condition. What are the signs or symptoms?  Trouble staying asleep.  Being sleepy or tired during the day.  Getting angry a lot.  Loud snoring.  Headaches in the morning.  Not being able to focus your mind (concentrate).  Forgetting things.  Less interest in sex.  Mood swings.  Personality changes.  Feelings of sadness (depression).  Waking up a lot during the night to pee (urinate).  Dry mouth.  Sore throat. How is  this diagnosed?  Your medical history.  A physical exam.  A test that is done when you are sleeping (sleep study). The test is most often done in a sleep lab but may also be done at home. How is this treated?   Sleeping on your side.  Using a medicine to get rid of mucus in your nose (decongestant).  Avoiding the use of alcohol, medicines to help you relax, or certain pain medicines (narcotics).  Losing weight, if needed.  Changing your diet.  Not smoking.  Using a machine to open your airway while you sleep, such as: ? An oral appliance. This is a mouthpiece that shifts your lower jaw forward. ? A CPAP device. This device blows air through a mask when you breathe out (exhale). ? An EPAP device. This has valves that you put in each nostril. ? A BPAP device. This device blows air through a mask when you breathe in (inhale) and breathe out.  Having surgery if other treatments do not work. It is important to get treatment for sleep apnea. Without treatment, it can lead to:  High blood pressure.  Coronary artery disease.  In men, not being able to have an erection (impotence).  Reduced thinking ability. Follow these instructions at home: Lifestyle  Make changes that your doctor recommends.  Eat a healthy diet.  Lose weight if needed.  Avoid alcohol, medicines to help you relax, and some pain medicines.  Do not use any products that contain nicotine or tobacco, such as cigarettes, e-cigarettes, and chewing  tobacco. If you need help quitting, ask your doctor. General instructions  Take over-the-counter and prescription medicines only as told by your doctor.  If you were given a machine to use while you sleep, use it only as told by your doctor.  If you are having surgery, make sure to tell your doctor you have sleep apnea. You may need to bring your device with you.  Keep all follow-up visits as told by your doctor. This is important. Contact a doctor if:  The  machine that you were given to use during sleep bothers you or does not seem to be working.  You do not get better.  You get worse. Get help right away if:  Your chest hurts.  You have trouble breathing in enough air.  You have an uncomfortable feeling in your back, arms, or stomach.  You have trouble talking.  One side of your body feels weak.  A part of your face is hanging down. These symptoms may be an emergency. Do not wait to see if the symptoms will go away. Get medical help right away. Call your local emergency services (911 in the [place] Do not drive yourself to the hospital. Summary  This condition affects breathing during sleep.  The most common cause is a collapsed or blocked airway.  The goal of treatment is to help you breathe normally while you sleep. This information is not intended to replace advice given to you by your health care provider. Make sure you discuss any questions you have with your health care provider. Document Revised: 09/18/2018 Document Reviewed: 07/28/2018 Elsevier Patient Education  [place].

## 2020-09-25 ENCOUNTER — Encounter: Payer: Self-pay | Admitting: Cardiology

## 2020-09-25 ENCOUNTER — Telehealth: Payer: Self-pay | Admitting: Cardiology

## 2020-09-25 NOTE — Telephone Encounter (Signed)
Left message for patient regarding appointment for Cardiac MRI scheduled Wednesday 10/25/20 at [facility] arrival time is 8:30 am at the 1st floor admissions office for a 9:00 am appointment.  Will mail information to patient and it is also in MY Chart.  Asked patient to call with questions or concerns.Marland Kitchen

## 2020-10-17 ENCOUNTER — Other Ambulatory Visit: Payer: Self-pay

## 2020-10-17 ENCOUNTER — Ambulatory Visit: Payer: BC Managed Care – PPO

## 2020-10-17 DIAGNOSIS — G4733 Obstructive sleep apnea (adult) (pediatric): Secondary | ICD-10-CM | POA: Diagnosis not present

## 2020-10-18 ENCOUNTER — Other Ambulatory Visit: Payer: Self-pay | Admitting: Emergency Medicine

## 2020-10-18 DIAGNOSIS — I152 Hypertension secondary to endocrine disorders: Secondary | ICD-10-CM

## 2020-10-18 NOTE — Telephone Encounter (Signed)
Requested Prescriptions  Pending Prescriptions Disp Refills   metFORMIN (GLUCOPHAGE) 1000 MG tablet [Pharmacy Med Name: METFORMIN HCL 1,000 MG TABLET] 180 tablet 0    Sig: TAKE 1 TABLET (1,000 MG TOTAL) BY MOUTH 2 (TWO) TIMES DAILY WITH A MEAL.     Endocrinology:  Diabetes - Biguanides Passed - 10/18/2020 12:07 AM      Passed - Cr in normal range and within 360 days    Creat  Date Value Ref Range Status  05/02/2016 0.96 0.60 - 1.35 mg/dL Final   Creatinine, Ser  Date Value Ref Range Status  04/13/2020 1.01 0.76 - 1.27 mg/dL Final         Passed - HBA1C is between 0 and 7.9 and within 180 days    Hemoglobin A1C  Date Value Ref Range Status  07/17/2020 7.3 (A) 4.0 - 5.6 % Final   Hgb A1c MFr Bld  Date Value Ref Range Status  04/01/2020 7.7 (H) 4.8 - 5.6 % Final    Comment:    (NOTE) Pre diabetes:          5.7%-6.4% Diabetes:              >6.4% Glycemic control for   <7.0% adults with diabetes          Passed - AA eGFR in normal range and within 360 days    GFR, Est African American  Date Value Ref Range Status  10/23/2015 79 >=60 mL/min Final   GFR calc Af Amer  Date Value Ref Range Status  04/13/2020 100 >59 mL/min/1.73 Final    Comment:    **[facility] currently reports eGFR in compliance with the current**   recommendations of the [employer]. [facility] will   update reporting as new guidelines are published from the NKF-ASN   Task force.    GFR, Est Non African American  Date Value Ref Range Status  10/23/2015 68 >=60 mL/min Final    Comment:      The estimated GFR is a calculation valid for adults (>=43 years old) that uses the CKD-EPI algorithm to adjust for age and sex. It is   not to be used for children, pregnant women, hospitalized patients,    patients on dialysis, or with rapidly changing kidney function. According to the NKDEP, eGFR >89 is normal, 60-89 shows mild impairment, 30-59 shows moderate impairment, 15-29 shows  severe impairment and <15 is ESRD.      GFR calc non Af Amer  Date Value Ref Range Status  04/13/2020 86 >59 mL/min/1.73 Final         Passed - Valid encounter within last 6 months    Recent Outpatient Visits          3 months ago Hypertension associated with diabetes [facility])   Primary Care at [facility], [place], MD   6 months ago Hypertension associated with diabetes [facility])   Primary Care at [facility], [place], MD   9 months ago Hypertension associated with diabetes [facility])   Primary Care at [facility], Ines Bloomer, MD   1 year ago Hypertension associated with diabetes [facility])   Primary Care at [facility], [place], MD   1 year ago Type 2 diabetes mellitus without complication, without long-term current use of insulin [facility])   Primary Care at [facility], Ines Bloomer, MD      Future Appointments            In 1 week Sagardia, Ines Bloomer, MD  Primary Care at [place], [place]   In 2 months Donato Heinz, MD [facility], [facility]

## 2020-10-20 ENCOUNTER — Other Ambulatory Visit: Payer: Self-pay | Admitting: Emergency Medicine

## 2020-10-20 ENCOUNTER — Telehealth: Payer: Self-pay | Admitting: Pulmonary Disease

## 2020-10-20 DIAGNOSIS — G4733 Obstructive sleep apnea (adult) (pediatric): Secondary | ICD-10-CM | POA: Diagnosis not present

## 2020-10-20 NOTE — Telephone Encounter (Signed)
Call patient  Sleep study result  Date of study: 10/17/2020  Impression: Severe obstructive sleep apnea Moderate oxygen desaturations  Recommendation: DME referral  Recommend CPAP therapy for severe obstructive sleep apnea  Auto titrating CPAP with pressure settings of 5-20 will be appropriate  Encourage weight loss measures  Follow-up in the office 4 to 6 weeks following initiation of treatment

## 2020-10-20 NOTE — Telephone Encounter (Signed)
Lm for patient.  

## 2020-10-24 ENCOUNTER — Telehealth (HOSPITAL_COMMUNITY): Payer: Self-pay | Admitting: Emergency Medicine

## 2020-10-24 NOTE — Telephone Encounter (Signed)
Reaching out to patient to offer assistance regarding upcoming cardiac imaging study; pt verbalizes understanding of appt date/time, parking situation and where to check in, pre-test NPO status and medications ordered, and verified current allergies; name and call back number provided for further questions should they arise Rocio Wolak RN Navigator Cardiac Imaging [facility] [phone] office [phone] cell  Pt denies claustro, denies implants 

## 2020-10-25 ENCOUNTER — Other Ambulatory Visit: Payer: Self-pay

## 2020-10-25 ENCOUNTER — Ambulatory Visit (HOSPITAL_COMMUNITY)
Admission: RE | Admit: 2020-10-25 | Discharge: 2020-10-25 | Disposition: A | Discharge status: Home / Self Care | Payer: BC Managed Care – PPO | Source: Ambulatory Visit | Attending: Cardiology | Admitting: Cardiology

## 2020-10-25 DIAGNOSIS — I519 Heart disease, unspecified: Secondary | ICD-10-CM | POA: Diagnosis not present

## 2020-10-25 MED ORDER — GADOBUTROL 1 MMOL/ML IV SOLN
13.0000 mL | Freq: Once | INTRAVENOUS | Status: AC | PRN
  Administered 2020-10-25: 13 mL via INTRAVENOUS

## 2020-10-30 ENCOUNTER — Encounter: Payer: Self-pay | Admitting: Emergency Medicine

## 2020-10-30 ENCOUNTER — Other Ambulatory Visit: Payer: Self-pay

## 2020-10-30 ENCOUNTER — Ambulatory Visit (INDEPENDENT_AMBULATORY_CARE_PROVIDER_SITE_OTHER): Payer: BC Managed Care – PPO | Admitting: Emergency Medicine

## 2020-10-30 VITALS — BP 140/80 | HR 79 | Temp 98.1°F | Resp 16 | Ht 67.0 in | Wt 259.0 lb

## 2020-10-30 DIAGNOSIS — E1159 Type 2 diabetes mellitus with other circulatory complications: Secondary | ICD-10-CM

## 2020-10-30 DIAGNOSIS — I152 Hypertension secondary to endocrine disorders: Secondary | ICD-10-CM

## 2020-10-30 DIAGNOSIS — Z23 Encounter for immunization: Secondary | ICD-10-CM | POA: Diagnosis not present

## 2020-10-30 DIAGNOSIS — E1169 Type 2 diabetes mellitus with other specified complication: Secondary | ICD-10-CM | POA: Diagnosis not present

## 2020-10-30 DIAGNOSIS — Z9109 Other allergy status, other than to drugs and biological substances: Secondary | ICD-10-CM | POA: Diagnosis not present

## 2020-10-30 DIAGNOSIS — Z6841 Body Mass Index (BMI) 40.0 and over, adult: Secondary | ICD-10-CM

## 2020-10-30 DIAGNOSIS — N529 Male erectile dysfunction, unspecified: Secondary | ICD-10-CM

## 2020-10-30 DIAGNOSIS — G4733 Obstructive sleep apnea (adult) (pediatric): Secondary | ICD-10-CM | POA: Diagnosis not present

## 2020-10-30 DIAGNOSIS — E785 Hyperlipidemia, unspecified: Secondary | ICD-10-CM

## 2020-10-30 LAB — POCT GLYCOSYLATED HEMOGLOBIN (HGB A1C): Hemoglobin A1C: 6.6 % — AB (ref 4.0–5.6)

## 2020-10-30 LAB — GLUCOSE, POCT (MANUAL RESULT ENTRY): POC Glucose: 168 mg/dl — AB (ref 70–99)

## 2020-10-30 MED ORDER — SILDENAFIL CITRATE 100 MG PO TABS: 50.0000 mg | ORAL_TABLET | Freq: Every day | ORAL | 11 refills | Status: DC | PRN

## 2020-10-30 MED ORDER — FLUTICASONE PROPIONATE 50 MCG/ACT NA SUSP: NASAL | 12 refills | Status: DC

## 2020-10-30 NOTE — Patient Instructions (Addendum)
   If you have lab work done today you will be contacted with your lab results within the next 2 weeks.  If you have not heard from us then please contact us. The fastest way to get your results is to register for My Chart.   IF you received an x-ray today, you will receive an invoice from [facility]. Please contact [facility] at [phone] with questions or concerns regarding your invoice.   IF you received labwork today, you will receive an invoice from [facility]. Please contact [facility] at [phone] with questions or concerns regarding your invoice.   Our billing staff will not be able to assist you with questions regarding bills from these companies.  You will be contacted with the lab results as soon as they are available. The fastest way to get your results is to activate your My Chart account. Instructions are located on the last page of this paperwork. If you have not heard from us regarding the results in 2 weeks, please contact this office.      Hypertension, Adult High blood pressure (hypertension) is when the force of blood pumping through the arteries is too strong. The arteries are the blood vessels that carry blood from the heart throughout the body. Hypertension forces the heart to work harder to pump blood and may cause arteries to become narrow or stiff. Untreated or uncontrolled hypertension can cause a heart attack, heart failure, a stroke, kidney disease, and other problems. A blood pressure reading consists of a higher number over a lower number. Ideally, your blood pressure should be below 120/80. The first ("top") number is called the systolic pressure. It is a measure of the pressure in your arteries as your heart beats. The second ("bottom") number is called the diastolic pressure. It is a measure of the pressure in your arteries as the heart relaxes. What are the causes? The exact cause of this condition is not known. There are some conditions  that result in or are related to high blood pressure. What increases the risk? Some risk factors for high blood pressure are under your control. The following factors may make you more likely to develop this condition:  Smoking.  Having type 2 diabetes mellitus, high cholesterol, or both.  Not getting enough exercise or physical activity.  Being overweight.  Having too much fat, sugar, calories, or salt (sodium) in your diet.  Drinking too much alcohol. Some risk factors for high blood pressure may be difficult or impossible to change. Some of these factors include:  Having chronic kidney disease.  Having a family history of high blood pressure.  Age. Risk increases with age.  Race. You may be at higher risk if you are African American.  Gender. Men are at higher risk than women before age 45. After age 65, women are at higher risk than men.  Having obstructive sleep apnea.  Stress. What are the signs or symptoms? High blood pressure may not cause symptoms. Very high blood pressure (hypertensive crisis) may cause:  Headache.  Anxiety.  Shortness of breath.  Nosebleed.  Nausea and vomiting.  Vision changes.  Severe chest pain.  Seizures. How is this diagnosed? This condition is diagnosed by measuring your blood pressure while you are seated, with your arm resting on a flat surface, your legs uncrossed, and your feet flat on the floor. The cuff of the blood pressure monitor will be placed directly against the skin of your upper arm at the level of your   heart. It should be measured at least twice using the same arm. Certain conditions can cause a difference in blood pressure between your right and left arms. Certain factors can cause blood pressure readings to be lower or higher than normal for a short period of time:  When your blood pressure is higher when you are in a health care provider's office than when you are at home, this is called white coat hypertension.  Most people with this condition do not need medicines.  When your blood pressure is higher at home than when you are in a health care provider's office, this is called masked hypertension. Most people with this condition may need medicines to control blood pressure. If you have a high blood pressure reading during one visit or you have normal blood pressure with other risk factors, you may be asked to:  Return on a different day to have your blood pressure checked again.  Monitor your blood pressure at home for 1 week or longer. If you are diagnosed with hypertension, you may have other blood or imaging tests to help your health care provider understand your overall risk for other conditions. How is this treated? This condition is treated by making healthy lifestyle changes, such as eating healthy foods, exercising more, and reducing your alcohol intake. Your health care provider may prescribe medicine if lifestyle changes are not enough to get your blood pressure under control, and if:  Your systolic blood pressure is above 130.  Your diastolic blood pressure is above 80. Your personal target blood pressure may vary depending on your medical conditions, your age, and other factors. Follow these instructions at home: Eating and drinking   Eat a diet that is high in fiber and potassium, and low in sodium, added sugar, and fat. An example eating plan is called the DASH (Dietary Approaches to Stop Hypertension) diet. To eat this way: ? Eat plenty of fresh fruits and vegetables. Try to fill one half of your plate at each meal with fruits and vegetables. ? Eat whole grains, such as whole-wheat pasta, brown rice, or whole-grain bread. Fill about one fourth of your plate with whole grains. ? Eat or drink low-fat dairy products, such as skim milk or low-fat yogurt. ? Avoid fatty cuts of meat, processed or cured meats, and poultry with skin. Fill about one fourth of your plate with lean proteins, such  as fish, chicken without skin, beans, eggs, or tofu. ? Avoid pre-made and processed foods. These tend to be higher in sodium, added sugar, and fat.  Reduce your daily sodium intake. Most people with hypertension should eat less than 1,500 mg of sodium a day.  Do not drink alcohol if: ? Your health care provider tells you not to drink. ? You are pregnant, may be pregnant, or are planning to become pregnant.  If you drink alcohol: ? Limit how much you use to:  0-1 drink a day for women.  0-2 drinks a day for men. ? Be aware of how much alcohol is in your drink. In the [place] one drink equals one 12 oz bottle of beer (355 mL), one 5 oz glass of wine (148 mL), or one 1 oz glass of hard liquor (44 mL). Lifestyle   Work with your health care provider to maintain a healthy body weight or to lose weight. Ask what an ideal weight is for you.  Get at least 30 minutes of exercise most days of the week. Activities may include walking, swimming,   or biking.  Include exercise to strengthen your muscles (resistance exercise), such as Pilates or lifting weights, as part of your weekly exercise routine. Try to do these types of exercises for 30 minutes at least 3 days a week.  Do not use any products that contain nicotine or tobacco, such as cigarettes, e-cigarettes, and chewing tobacco. If you need help quitting, ask your health care provider.  Monitor your blood pressure at home as told by your health care provider.  Keep all follow-up visits as told by your health care provider. This is important. Medicines  Take over-the-counter and prescription medicines only as told by your health care provider. Follow directions carefully. Blood pressure medicines must be taken as prescribed.  Do not skip doses of blood pressure medicine. Doing this puts you at risk for problems and can make the medicine less effective.  Ask your health care provider about side effects or reactions to medicines that you  should watch for. Contact a health care provider if you:  Think you are having a reaction to a medicine you are taking.  Have headaches that keep coming back (recurring).  Feel dizzy.  Have swelling in your ankles.  Have trouble with your vision. Get help right away if you:  Develop a severe headache or confusion.  Have unusual weakness or numbness.  Feel faint.  Have severe pain in your chest or abdomen.  Vomit repeatedly.  Have trouble breathing. Summary  Hypertension is when the force of blood pumping through your arteries is too strong. If this condition is not controlled, it may put you at risk for serious complications.  Your personal target blood pressure may vary depending on your medical conditions, your age, and other factors. For most people, a normal blood pressure is less than 120/80.  Hypertension is treated with lifestyle changes, medicines, or a combination of both. Lifestyle changes include losing weight, eating a healthy, low-sodium diet, exercising more, and limiting alcohol. This information is not intended to replace advice given to you by your health care provider. Make sure you discuss any questions you have with your health care provider. Document Revised: 08/12/2018 Document Reviewed: 08/12/2018 Elsevier Patient Education  2020 [employer]  Diabetes Mellitus and Nutrition, Adult When you have diabetes (diabetes mellitus), it is very important to have healthy eating habits because your blood sugar (glucose) levels are greatly affected by what you eat and drink. Eating healthy foods in the appropriate amounts, at about the same times every day, can help you:  Control your blood glucose.  Lower your risk of heart disease.  Improve your blood pressure.  Reach or maintain a healthy weight. Every person with diabetes is different, and each person has different needs for a meal plan. Your health care provider may recommend that you work with a diet  and nutrition specialist (dietitian) to make a meal plan that is best for you. Your meal plan may vary depending on factors such as:  The calories you need.  The medicines you take.  Your weight.  Your blood glucose, blood pressure, and cholesterol levels.  Your activity level.  Other health conditions you have, such as heart or kidney disease. How do carbohydrates affect me? Carbohydrates, also called carbs, affect your blood glucose level more than any other type of food. Eating carbs naturally raises the amount of glucose in your blood. Carb counting is a method for keeping track of how many carbs you eat. Counting carbs is important to keep your blood glucose   at a healthy level, especially if you use insulin or take certain oral diabetes medicines. It is important to know how many carbs you can safely have in each meal. This is different for every person. Your dietitian can help you calculate how many carbs you should have at each meal and for each snack. Foods that contain carbs include:  Bread, cereal, rice, pasta, and crackers.  Potatoes and corn.  Peas, beans, and lentils.  Milk and yogurt.  Fruit and juice.  Desserts, such as cakes, cookies, ice cream, and candy. How does alcohol affect me? Alcohol can cause a sudden decrease in blood glucose (hypoglycemia), especially if you use insulin or take certain oral diabetes medicines. Hypoglycemia can be a life-threatening condition. Symptoms of hypoglycemia (sleepiness, dizziness, and confusion) are similar to symptoms of having too much alcohol. If your health care provider says that alcohol is safe for you, follow these guidelines:  Limit alcohol intake to no more than 1 drink per day for nonpregnant women and 2 drinks per day for men. One drink equals 12 oz of beer, 5 oz of wine, or 1 oz of hard liquor.  Do not drink on an empty stomach.  Keep yourself hydrated with water, diet soda, or unsweetened iced tea.  Keep in  mind that regular soda, juice, and other mixers may contain a lot of sugar and must be counted as carbs. What are tips for following this plan?  Reading food labels  Start by checking the serving size on the "Nutrition Facts" label of packaged foods and drinks. The amount of calories, carbs, fats, and other nutrients listed on the label is based on one serving of the item. Many items contain more than one serving per package.  Check the total grams (g) of carbs in one serving. You can calculate the number of servings of carbs in one serving by dividing the total carbs by 15. For example, if a food has 30 g of total carbs, it would be equal to 2 servings of carbs.  Check the number of grams (g) of saturated and trans fats in one serving. Choose foods that have low or no amount of these fats.  Check the number of milligrams (mg) of salt (sodium) in one serving. Most people should limit total sodium intake to less than 2,300 mg per day.  Always check the nutrition information of foods labeled as "low-fat" or "nonfat". These foods may be higher in added sugar or refined carbs and should be avoided.  Talk to your dietitian to identify your daily goals for nutrients listed on the label. Shopping  Avoid buying canned, premade, or processed foods. These foods tend to be high in fat, sodium, and added sugar.  Shop around the outside edge of the grocery store. This includes fresh fruits and vegetables, bulk grains, fresh meats, and fresh dairy. Cooking  Use low-heat cooking methods, such as baking, instead of high-heat cooking methods like deep frying.  Cook using healthy oils, such as olive, canola, or sunflower oil.  Avoid cooking with butter, cream, or high-fat meats. Meal planning  Eat meals and snacks regularly, preferably at the same times every day. Avoid going long periods of time without eating.  Eat foods high in fiber, such as fresh fruits, vegetables, beans, and whole grains. Talk  to your dietitian about how many servings of carbs you can eat at each meal.  Eat 4-6 ounces (oz) of lean protein each day, such as lean meat, chicken, fish, eggs, or   tofu. One oz of lean protein is equal to: ? 1 oz of meat, chicken, or fish. ? 1 egg. ?  cup of tofu.  Eat some foods each day that contain healthy fats, such as avocado, nuts, seeds, and fish. Lifestyle  Check your blood glucose regularly.  Exercise regularly as told by your health care provider. This may include: ? 150 minutes of moderate-intensity or vigorous-intensity exercise each week. This could be brisk walking, biking, or water aerobics. ? Stretching and doing strength exercises, such as yoga or weightlifting, at least 2 times a week.  Take medicines as told by your health care provider.  Do not use any products that contain nicotine or tobacco, such as cigarettes and e-cigarettes. If you need help quitting, ask your health care provider.  Work with a counselor or diabetes educator to identify strategies to manage stress and any emotional and social challenges. Questions to ask a health care provider  Do I need to meet with a diabetes educator?  Do I need to meet with a dietitian?  What number can I call if I have questions?  When are the best times to check my blood glucose? Where to find more information:  American Diabetes Association: diabetes.org  Academy of Nutrition and Dietetics: www.eatright.org  National Institute of Diabetes and Digestive and Kidney Diseases (NIH): www.niddk.nih.gov Summary  A healthy meal plan will help you control your blood glucose and maintain a healthy lifestyle.  Working with a diet and nutrition specialist (dietitian) can help you make a meal plan that is best for you.  Keep in mind that carbohydrates (carbs) and alcohol have immediate effects on your blood glucose levels. It is important to count carbs and to use alcohol carefully. This information is not intended  to replace advice given to you by your health care provider. Make sure you discuss any questions you have with your health care provider. Document Revised: 11/14/2017 Document Reviewed: 01/06/2017 Elsevier Patient Education  2020 [employer] Inc.  

## 2020-10-30 NOTE — Assessment & Plan Note (Signed)
Elevated blood pressure readings in the office due to missed today's dose of medication.  Continue present medications.  No changes. Much improved diabetes with hemoglobin A1c of 6.6.  Continue present medication.  No changes. Diet and nutrition discussed. Follow-up in 6 months.

## 2020-10-30 NOTE — Progress Notes (Signed)
Walter Walter Creech Jr. 49 y.o.   Chief Complaint  Patient presents with  . Diabetes    follow up 3 months  . Hypertension    HISTORY OF PRESENT ILLNESS: This is a 49 y.o. male with history of diabetes and hypertension here for follow-up. 1.  Diabetes: On Metformin 1000 mg twice a day. Lab Results  Component Value Date   HGBA1C 7.3 (A) 07/17/2020   2.  Hypertension: On lisinopril 20 mg and metoprolol succinate 25 mg daily.  Has not taken daily dose today. BP Readings from Last 3 Encounters:  10/30/20 (!) 150/82  09/20/20 122/78  09/18/20 123/61  Fully vaccinated against Covid. Complaining of erectile dysfunction. No other complaints or medical concerns today. Recently seen by pulmonary doctor for reassessment of sleep apnea.  Sleep home test shows severe obstructive sleep apnea and auto titrate CPAP machine recommended. Recent cardiac MRI within normal limits. Recent renal function normal.  No microalbumin in the urine recently.  HPI   Prior to Admission medications   Medication Sig Start Date End Date Taking? Authorizing Provider  aspirin EC 81 MG EC tablet Take 1 tablet (81 mg total) by mouth daily. 04/02/20  Yes Albertine GratesXu, Fang, MD  atorvastatin (LIPITOR) 20 MG tablet TAKE 2 TABLETS BY MOUTH EVERY DAY 10/20/20  Yes Mikeyla Music, Eilleen KempfMiguel Jose, MD  fluticasone (FLONASE) 50 MCG/ACT nasal spray SPRAY 2 SPRAYS INTO EACH NOSTRIL EVERY DAY Patient taking differently: Place 2 sprays into both nostrils daily.  09/14/19  Yes Tiburcio Linder, Eilleen KempfMiguel Jose, MD  metFORMIN (GLUCOPHAGE) 1000 MG tablet TAKE 1 TABLET (1,000 MG TOTAL) BY MOUTH 2 (TWO) TIMES DAILY WITH A MEAL. 10/18/20  Yes Khrystyna Schwalm, Eilleen KempfMiguel Jose, MD  metoprolol succinate (TOPROL XL) 25 MG 24 hr tablet Take 1 tablet (25 mg total) by mouth daily. 09/18/20  Yes Little IshikawaSchumann, Christopher L, MD  lisinopril (ZESTRIL) 20 MG tablet Take 1 tablet (20 mg total) by mouth daily. 09/27/19 07/17/20  [name], Izel Eisenhardt Jose, MD    Allergies  Allergen Reactions  .  Glipizide Shortness Of Breath    Shakey   . Doxycycline Hives  . Erythromycin Base Hives    Patient Active Problem List   Diagnosis Date Noted  . Body mass index (BMI) of 40.1-44.9 in adult (HCC) 07/17/2020  . Hypertension associated with diabetes (HCC) 09/27/2019  . Morbid obesity (HCC) 09/27/2019  . Dyslipidemia 09/09/2018  . Environmental allergies 09/09/2018  . BMI 39.0-39.9,adult 09/03/2017  . Dyslipidemia associated with type 2 diabetes mellitus (HCC) 09/03/2017  . Anemia 04/14/2017  . Hyperlipidemia 05/02/2016  . OSA (obstructive sleep apnea) 09/06/2015  . Benign essential HTN 09/06/2015    Past Medical History:  Diagnosis Date  . Allergy   . Diabetes mellitus without complication (HCC)   . Elevated cholesterol   . Hernia    bi lateral hernia repair  . History of kidney stones   . Hypertension   . Sleep apnea 2014   severe  . Umbilical hernia without obstruction and without gangrene 09/03/2017    Past Surgical History:  Procedure Laterality Date  . CARPAL TUNNEL RELEASE Right 2007  . CARPAL TUNNEL RELEASE Left 10/28/2014   Procedure: LEFT CARPAL TUNNEL RELEASE;  Surgeon: Dairl PonderMatthew Weingold, MD;  Location: [facility];  Service: Orthopedics;  Laterality: Left;  . FOOT SURGERY Left   . INGUINAL HERNIA REPAIR     times two, each side  . KIDNEY STONE SURGERY    . MASS EXCISION Left 10/28/2014   Procedure: LEFT WRIST VOLAR MASS  EXCISION;  Surgeon: Dairl Ponder, MD;  Location:  SURGERY CENTER;  Service: Orthopedics;  Laterality: Left;  . UMBILICAL HERNIA REPAIR N/A 10/27/2017   Procedure: HERNIA REPAIR UMBILICAL ADULT;  Surgeon: Berna Bue, MD;  Location: [facility] OR;  Service: General;  Laterality: N/A;    Social History   Socioeconomic History  . Marital status: Married    Spouse name: Walter Reynolds  . Number of children: 2  . Years of education: Associates  . Highest education level: Not on file  Occupational History  .  Occupation: Merchandiser, retail    Comment: [employer]  Tobacco Use  . Smoking status: Never Smoker  . Smokeless tobacco: Never Used  Vaping Use  . Vaping Use: Never used  Substance and Sexual Activity  . Alcohol use: No    Alcohol/week: 0.0 standard drinks  . Drug use: No  . Sexual activity: Yes    Partners: Female    Birth control/protection: Condom  Other Topics Concern  . Not on file  Social History Narrative   Lives with his wife and their 2 children.   Walter Reynolds has 3 sons from a previous relationship that live independently.   Formerly in the [employer].   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   Social Determinants of Health   Financial Resource Strain:   . Difficulty of Paying Living Expenses: Not on file  Food Insecurity:   . Worried About Programme researcher, broadcasting/film/video in the Last Year: Not on file  . Ran Out of Food in the Last Year: Not on file  Transportation Needs:   . Lack of Transportation (Medical): Not on file  . Lack of Transportation (Non-Medical): Not on file  Physical Activity:   . Days of Exercise per Week: Not on file  . Minutes of Exercise per Session: Not on file  Stress:   . Feeling of Stress : Not on file  Social Connections:   . Frequency of Communication with Friends and Family: Not on file  . Frequency of Social Gatherings with Friends and Family: Not on file  . Attends Religious Services: Not on file  . Active Member of Clubs or Organizations: Not on file  . Attends Banker Meetings: Not on file  . Marital Status: Not on file  Intimate Partner Violence:   . Fear of Current or Ex-Partner: Not on file  . Emotionally Abused: Not on file  . Physically Abused: Not on file  . Sexually Abused: Not on file    Family History  Problem Relation Age of Onset  . Diabetes Mother   . Stroke Mother        4 strokes  . Heart attack Mother   . Heart disease Mother   .  Hypertension Father   . Hypertension Sister   . Heart disease Sister        pacemaker     Review of Systems  Constitutional: Negative.  Negative for chills and fever.  HENT: Negative.  Negative for congestion and sore throat.   Respiratory: Negative.  Negative for cough and shortness of breath.   Cardiovascular: Negative.  Negative for chest pain and palpitations.  Gastrointestinal: Negative.  Negative for abdominal pain, diarrhea, nausea and vomiting.  Genitourinary: Negative.  Negative for dysuria and hematuria.  Musculoskeletal: Negative.  Negative for back pain, myalgias and neck pain.  Skin: Negative.  Negative for rash.  Neurological: Negative.  Negative for dizziness and headaches.  All other systems reviewed and are negative.     Today's Vitals   10/30/20 0906  BP: (!) 150/82  Pulse: 79  Resp: 16  Temp: 98.1 F (36.7 C)  TempSrc: Temporal  SpO2: 96%  Weight: 259 lb (117.5 kg)  Height: 5\' 7"  (1.702 m)   Body mass index is 40.57 kg/m. Wt Readings from Last 3 Encounters:  10/30/20 259 lb (117.5 kg)  09/20/20 258 lb (117 kg)  09/18/20 256 lb 9.6 oz (116.4 kg)    Physical Exam Vitals reviewed.  Constitutional:      Appearance: Normal appearance.  HENT:     Head: Normocephalic.  Eyes:     Extraocular Movements: Extraocular movements intact.     Conjunctiva/sclera: Conjunctivae normal.     Pupils: Pupils are equal, round, and reactive to light.  Cardiovascular:     Rate and Rhythm: Normal rate and regular rhythm.     Pulses: Normal pulses.     Heart sounds: Normal heart sounds.  Pulmonary:     Effort: Pulmonary effort is normal.     Breath sounds: Normal breath sounds.  Musculoskeletal:        General: Normal range of motion.     Cervical back: Normal range of motion and neck supple.  Skin:    General: Skin is warm and dry.     Capillary Refill: Capillary refill takes less than 2 seconds.  Neurological:     General: No focal deficit present.      Mental Status: Walter Reynolds is alert and oriented to person, place, and time.  Psychiatric:        Mood and Affect: Mood normal.        Behavior: Behavior normal.     Results for orders placed or performed in visit on 10/30/20 (from the past 24 hour(s))  POCT glucose (manual entry)     Status: Abnormal   Collection Time: 10/30/20  9:43 AM  Result Value Ref Range   POC Glucose 168 (A) 70 - 99 mg/dl  POCT glycosylated hemoglobin (Hb A1C)     Status: Abnormal   Collection Time: 10/30/20  9:49 AM  Result Value Ref Range   Hemoglobin A1C 6.6 (A) 4.0 - 5.6 %   HbA1c POC (<> result, manual entry)     HbA1c, POC (prediabetic range)     HbA1c, POC (controlled diabetic range)      ASSESSMENT & PLAN: Hypertension associated with diabetes (HCC) Elevated blood pressure readings in the office due to missed today's dose of medication.  Continue present medications.  No changes. Much improved diabetes with hemoglobin A1c of 6.6.  Continue present medication.  No changes. Diet and nutrition discussed. Follow-up in 6 months.  Walter Reynolds was seen today for diabetes and hypertension.  Diagnoses and all orders for this visit:  Hypertension associated with diabetes (HCC) -     POCT glucose (manual entry) -     POCT glycosylated hemoglobin (Hb A1C) -     HM Diabetes Foot Exam  Environmental allergies Comments: Resume Flonase. If inadequate to control his symptoms, will add azelastine ns. Orders: -     fluticasone (FLONASE) 50 MCG/ACT nasal spray; SPRAY 2 SPRAYS INTO EACH NOSTRIL EVERY DAY  Dyslipidemia associated with type 2 diabetes mellitus (HCC)  OSA (obstructive sleep apnea)  Body mass index (BMI) of 40.1-44.9 in adult [facility])  Morbid obesity (HCC)  Erectile dysfunction, unspecified erectile dysfunction type -     sildenafil (VIAGRA) 100  MG tablet; Take 0.5-1 tablets (50-100 mg total) by mouth daily as needed for erectile dysfunction.  Need for prophylactic vaccination and inoculation against  influenza  Other orders -     Flu Vaccine QUAD 36+ mos IM    Patient Instructions       If you have lab work done today you will be contacted with your lab results within the next 2 weeks.  If you have not heard from [place] then please contact us. The fastest way to get your results is to register for My Chart.   IF you received an x-ray today, you will receive an invoice from [facility]. Please contact [facility] at [phone] with questions or concerns regarding your invoice.   IF you received labwork today, you will receive an invoice from [facility]. Please contact [facility] at [phone] with questions or concerns regarding your invoice.   Our billing staff will not be able to assist you with questions regarding bills from these companies.  You will be contacted with the lab results as soon as they are available. The fastest way to get your results is to activate your My Chart account. Instructions are located on the last page of this paperwork. If you have not heard from [place] regarding the results in 2 weeks, please contact this office.     Hypertension, Adult High blood pressure (hypertension) is when the force of blood pumping through the arteries is too strong. The arteries are the blood vessels that carry blood from the heart throughout the body. Hypertension forces the heart to work harder to pump blood and may cause arteries to become narrow or stiff. Untreated or uncontrolled hypertension can cause a heart attack, heart failure, a stroke, kidney disease, and other problems. A blood pressure reading consists of a higher number over a lower number. Ideally, your blood pressure should be below 120/80. The first ("top") number is called the systolic pressure. It is a measure of the pressure in your arteries as your heart beats. The second ("bottom") number is called the diastolic pressure. It is a measure of the pressure in your arteries as the heart relaxes. What  are the causes? The exact cause of this condition is not known. There are some conditions that result in or are related to high blood pressure. What increases the risk? Some risk factors for high blood pressure are under your control. The following factors may make you more likely to develop this condition:  Smoking.  Having type 2 diabetes mellitus, high cholesterol, or both.  Not getting enough exercise or physical activity.  Being overweight.  Having too much fat, sugar, calories, or salt (sodium) in your diet.  Drinking too much alcohol. Some risk factors for high blood pressure may be difficult or impossible to change. Some of these factors include:  Having chronic kidney disease.  Having a family history of high blood pressure.  Age. Risk increases with age.  Race. You may be at higher risk if you are African American.  Gender. Men are at higher risk than women before age 42. After age 37, women are at higher risk than men.  Having obstructive sleep apnea.  Stress. What are the signs or symptoms? High blood pressure may not cause symptoms. Very high blood pressure (hypertensive crisis) may cause:  Headache.  Anxiety.  Shortness of breath.  Nosebleed.  Nausea and vomiting.  Vision changes.  Severe chest pain.  Seizures. How is this diagnosed? This condition is diagnosed by measuring your  blood pressure while you are seated, with your arm resting on a flat surface, your legs uncrossed, and your feet flat on the floor. The cuff of the blood pressure monitor will be placed directly against the skin of your upper arm at the level of your heart. It should be measured at least twice using the same arm. Certain conditions can cause a difference in blood pressure between your right and left arms. Certain factors can cause blood pressure readings to be lower or higher than normal for a short period of time:  When your blood pressure is higher when you are in a health  care provider's office than when you are at home, this is called white coat hypertension. Most people with this condition do not need medicines.  When your blood pressure is higher at home than when you are in a health care provider's office, this is called masked hypertension. Most people with this condition may need medicines to control blood pressure. If you have a high blood pressure reading during one visit or you have normal blood pressure with other risk factors, you may be asked to:  Return on a different day to have your blood pressure checked again.  Monitor your blood pressure at home for 1 week or longer. If you are diagnosed with hypertension, you may have other blood or imaging tests to help your health care provider understand your overall risk for other conditions. How is this treated? This condition is treated by making healthy lifestyle changes, such as eating healthy foods, exercising more, and reducing your alcohol intake. Your health care provider may prescribe medicine if lifestyle changes are not enough to get your blood pressure under control, and if:  Your systolic blood pressure is above 130.  Your diastolic blood pressure is above 80. Your personal target blood pressure may vary depending on your medical conditions, your age, and other factors. Follow these instructions at home: Eating and drinking   Eat a diet that is high in fiber and potassium, and low in sodium, added sugar, and fat. An example eating plan is called the DASH (Dietary Approaches to Stop Hypertension) diet. To eat this way: ? Eat plenty of fresh fruits and vegetables. Try to fill one half of your plate at each meal with fruits and vegetables. ? Eat whole grains, such as whole-wheat pasta, brown rice, or whole-grain bread. Fill about one fourth of your plate with whole grains. ? Eat or drink low-fat dairy products, such as skim milk or low-fat yogurt. ? Avoid fatty cuts of meat, processed or cured  meats, and poultry with skin. Fill about one fourth of your plate with lean proteins, such as fish, chicken without skin, beans, eggs, or tofu. ? Avoid pre-made and processed foods. These tend to be higher in sodium, added sugar, and fat.  Reduce your daily sodium intake. Most people with hypertension should eat less than 1,500 mg of sodium a day.  Do not drink alcohol if: ? Your health care provider tells you not to drink. ? You are pregnant, may be pregnant, or are planning to become pregnant.  If you drink alcohol: ? Limit how much you use to:  0-1 drink a day for women.  0-2 drinks a day for men. ? Be aware of how much alcohol is in your drink. In the [place] one drink equals one 12 oz bottle of beer (355 mL), one 5 oz glass of wine (148 mL), or one 1 oz glass of hard liquor (  44 mL). Lifestyle   Work with your health care provider to maintain a healthy body weight or to lose weight. Ask what an ideal weight is for you.  Get at least 30 minutes of exercise most days of the week. Activities may include walking, swimming, or biking.  Include exercise to strengthen your muscles (resistance exercise), such as Pilates or lifting weights, as part of your weekly exercise routine. Try to do these types of exercises for 30 minutes at least 3 days a week.  Do not use any products that contain nicotine or tobacco, such as cigarettes, e-cigarettes, and chewing tobacco. If you need help quitting, ask your health care provider.  Monitor your blood pressure at home as told by your health care provider.  Keep all follow-up visits as told by your health care provider. This is important. Medicines  Take over-the-counter and prescription medicines only as told by your health care provider. Follow directions carefully. Blood pressure medicines must be taken as prescribed.  Do not skip doses of blood pressure medicine. Doing this puts you at risk for problems and can make the medicine less  effective.  Ask your health care provider about side effects or reactions to medicines that you should watch for. Contact a health care provider if you:  Think you are having a reaction to a medicine you are taking.  Have headaches that keep coming back (recurring).  Feel dizzy.  Have swelling in your ankles.  Have trouble with your vision. Get help right away if you:  Develop a severe headache or confusion.  Have unusual weakness or numbness.  Feel faint.  Have severe pain in your chest or abdomen.  Vomit repeatedly.  Have trouble breathing. Summary  Hypertension is when the force of blood pumping through your arteries is too strong. If this condition is not controlled, it may put you at risk for serious complications.  Your personal target blood pressure may vary depending on your medical conditions, your age, and other factors. For most people, a normal blood pressure is less than 120/80.  Hypertension is treated with lifestyle changes, medicines, or a combination of both. Lifestyle changes include losing weight, eating a healthy, low-sodium diet, exercising more, and limiting alcohol. This information is not intended to replace advice given to you by your health care provider. Make sure you discuss any questions you have with your health care provider. Document Revised: 08/12/2018 Document Reviewed: 08/12/2018 Elsevier Patient Education  2020 [employer].  Diabetes Mellitus and Nutrition, Adult When you have diabetes (diabetes mellitus), it is very important to have healthy eating habits because your blood sugar (glucose) levels are greatly affected by what you eat and drink. Eating healthy foods in the appropriate amounts, at about the same times every day, can help you:  Control your blood glucose.  Lower your risk of heart disease.  Improve your blood pressure.  Reach or maintain a healthy weight. Every person with diabetes is different, and each person has  different needs for a meal plan. Your health care provider may recommend that you work with a diet and nutrition specialist (dietitian) to make a meal plan that is best for you. Your meal plan may vary depending on factors such as:  The calories you need.  The medicines you take.  Your weight.  Your blood glucose, blood pressure, and cholesterol levels.  Your activity level.  Other health conditions you have, such as heart or kidney disease. How do carbohydrates affect me? Carbohydrates, also called  carbs, affect your blood glucose level more than any other type of food. Eating carbs naturally raises the amount of glucose in your blood. Carb counting is a method for keeping track of how many carbs you eat. Counting carbs is important to keep your blood glucose at a healthy level, especially if you use insulin or take certain oral diabetes medicines. It is important to know how many carbs you can safely have in each meal. This is different for every person. Your dietitian can help you calculate how many carbs you should have at each meal and for each snack. Foods that contain carbs include:  Bread, cereal, rice, pasta, and crackers.  Potatoes and corn.  Peas, beans, and lentils.  Milk and yogurt.  Fruit and juice.  Desserts, such as cakes, cookies, ice cream, and candy. How does alcohol affect me? Alcohol can cause a sudden decrease in blood glucose (hypoglycemia), especially if you use insulin or take certain oral diabetes medicines. Hypoglycemia can be a life-threatening condition. Symptoms of hypoglycemia (sleepiness, dizziness, and confusion) are similar to symptoms of having too much alcohol. If your health care provider says that alcohol is safe for you, follow these guidelines:  Limit alcohol intake to no more than 1 drink per day for nonpregnant women and 2 drinks per day for men. One drink equals 12 oz of beer, 5 oz of wine, or 1 oz of hard liquor.  Do not drink on an  empty stomach.  Keep yourself hydrated with water, diet soda, or unsweetened iced tea.  Keep in mind that regular soda, juice, and other mixers may contain a lot of sugar and must be counted as carbs. What are tips for following this plan?  Reading food labels  Start by checking the serving size on the "Nutrition Facts" label of packaged foods and drinks. The amount of calories, carbs, fats, and other nutrients listed on the label is based on one serving of the item. Many items contain more than one serving per package.  Check the total grams (g) of carbs in one serving. You can calculate the number of servings of carbs in one serving by dividing the total carbs by 15. For example, if a food has 30 g of total carbs, it would be equal to 2 servings of carbs.  Check the number of grams (g) of saturated and trans fats in one serving. Choose foods that have low or no amount of these fats.  Check the number of milligrams (mg) of salt (sodium) in one serving. Most people should limit total sodium intake to less than 2,300 mg per day.  Always check the nutrition information of foods labeled as "low-fat" or "nonfat". These foods may be higher in added sugar or refined carbs and should be avoided.  Talk to your dietitian to identify your daily goals for nutrients listed on the label. Shopping  Avoid buying canned, premade, or processed foods. These foods tend to be high in fat, sodium, and added sugar.  Shop around the outside edge of the grocery store. This includes fresh fruits and vegetables, bulk grains, fresh meats, and fresh dairy. Cooking  Use low-heat cooking methods, such as baking, instead of high-heat cooking methods like deep frying.  Cook using healthy oils, such as olive, canola, or sunflower oil.  Avoid cooking with butter, cream, or high-fat meats. Meal planning  Eat meals and snacks regularly, preferably at the same times every day. Avoid going long periods of time without  eating.  Eat  foods high in fiber, such as fresh fruits, vegetables, beans, and whole grains. Talk to your dietitian about how many servings of carbs you can eat at each meal.  Eat 4-6 ounces (oz) of lean protein each day, such as lean meat, chicken, fish, eggs, or tofu. One oz of lean protein is equal to: ? 1 oz of meat, chicken, or fish. ? 1 egg. ?  cup of tofu.  Eat some foods each day that contain healthy fats, such as avocado, nuts, seeds, and fish. Lifestyle  Check your blood glucose regularly.  Exercise regularly as told by your health care provider. This may include: ? 150 minutes of moderate-intensity or vigorous-intensity exercise each week. This could be brisk walking, biking, or water aerobics. ? Stretching and doing strength exercises, such as yoga or weightlifting, at least 2 times a week.  Take medicines as told by your health care provider.  Do not use any products that contain nicotine or tobacco, such as cigarettes and e-cigarettes. If you need help quitting, ask your health care provider.  Work with a Veterinary surgeon or diabetes educator to identify strategies to manage stress and any emotional and social challenges. Questions to ask a health care provider  Do I need to meet with a diabetes educator?  Do I need to meet with a dietitian?  What number can I call if I have questions?  When are the best times to check my blood glucose? Where to find more information:  American Diabetes Association: diabetes.org  Academy of Nutrition and Dietetics: www.eatright.[employer] of Diabetes and Digestive and Kidney Diseases (NIH): CarFlippers.tn Summary  A healthy meal plan will help you control your blood glucose and maintain a healthy lifestyle.  Working with a diet and nutrition specialist (dietitian) can help you make a meal plan that is best for you.  Keep in mind that carbohydrates (carbs) and alcohol have immediate effects on your blood glucose  levels. It is important to count carbs and to use alcohol carefully. This information is not intended to replace advice given to you by your health care provider. Make sure you discuss any questions you have with your health care provider. Document Revised: 11/14/2017 Document Reviewed: 01/06/2017 Elsevier Patient Education  2020 Elsevier Inc.      Edwina Barth, MD [facility]

## 2020-11-11 ENCOUNTER — Other Ambulatory Visit: Payer: Self-pay | Admitting: Emergency Medicine

## 2020-11-11 DIAGNOSIS — E1159 Type 2 diabetes mellitus with other circulatory complications: Secondary | ICD-10-CM

## 2020-11-15 ENCOUNTER — Other Ambulatory Visit: Payer: Self-pay | Admitting: Emergency Medicine

## 2020-11-20 NOTE — Telephone Encounter (Signed)
Called and spoke with pt and he is aware of results of the sleep study.  He stated that [employer] is to send him out new supplies for the old machine, but I advised him to call them and cancel that since we are sending in an order for a new machine.  Pt is aware and nothing further is needed.

## 2020-11-20 NOTE — Telephone Encounter (Signed)
Pt calling regarding sleep study results. [phone]

## 2020-11-30 DIAGNOSIS — G4733 Obstructive sleep apnea (adult) (pediatric): Secondary | ICD-10-CM | POA: Diagnosis not present
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
## 2020-12-20 ENCOUNTER — Ambulatory Visit: Payer: BC Managed Care – PPO | Admitting: Cardiology

## 2021-01-10 ENCOUNTER — Other Ambulatory Visit: Payer: Self-pay | Admitting: Emergency Medicine

## 2021-01-10 DIAGNOSIS — I152 Hypertension secondary to endocrine disorders: Secondary | ICD-10-CM

## 2021-01-10 DIAGNOSIS — E1159 Type 2 diabetes mellitus with other circulatory complications: Secondary | ICD-10-CM

## 2021-01-11 ENCOUNTER — Telehealth (INDEPENDENT_AMBULATORY_CARE_PROVIDER_SITE_OTHER): Payer: BC Managed Care – PPO | Admitting: Family Medicine

## 2021-01-11 ENCOUNTER — Telehealth: Payer: Self-pay

## 2021-01-11 ENCOUNTER — Encounter: Payer: Self-pay | Admitting: Family Medicine

## 2021-01-11 ENCOUNTER — Other Ambulatory Visit: Payer: Self-pay

## 2021-01-11 DIAGNOSIS — Z9109 Other allergy status, other than to drugs and biological substances: Secondary | ICD-10-CM

## 2021-01-11 DIAGNOSIS — R059 Cough, unspecified: Secondary | ICD-10-CM

## 2021-01-11 DIAGNOSIS — G4733 Obstructive sleep apnea (adult) (pediatric): Secondary | ICD-10-CM

## 2021-01-11 MED ORDER — HYDROCOD POLST-CPM POLST ER 10-8 MG/5ML PO SUER: 5.0000 mL | Freq: Every evening | ORAL | 0 refills | Status: AC | PRN

## 2021-01-11 MED ORDER — FLUTICASONE PROPIONATE 50 MCG/ACT NA SUSP: NASAL | 12 refills | Status: AC

## 2021-01-11 NOTE — Progress Notes (Signed)
Virtual Visit Note  I connected with patient on 01/11/21 at 1138 by telephone due to unable to work Epic video visit and verified that I am speaking with the correct person using two identifiers. Walter Reynolds. is currently located at home and no family members are currently with them during visit. The provider, Azalee Course Ronnette Rump, FNP is located in their office at time of visit.  I discussed the limitations, risks, security and privacy concerns of performing an evaluation and management service by telephone and the availability of in person appointments. I also discussed with the patient that there may be a patient responsible charge related to this service. The patient expressed understanding and agreed to proceed.   I provided 20 minutes of non-face-to-face time during this encounter.  Chief Complaint  Patient presents with  . Sinus Problem    Sinus pressure and airway irritation - using nyquil  [place] waiting on cpap machine , not sleeping     HPI ? Sinus issues which make it hard to wear CPAP Fully vaccinated against flu and covid Was supposed to get different CPAP by end of Dec Still on back order, having to continue to use old CPAP Now having a cough that's making throat dry Would like to change CPAP settings His is a consistent flow and not dynamic Unsure if CPAP has humidification This is making is hard to sleep since he can't wear the cpap This is the first time this has happened Few weeks of congestion Not using CPAP in 5 days Used the CPAP last night and didn't have throat pain 2 negative covid tests over the past two weeks    Allergies  Allergen Reactions  . Glipizide Shortness Of Breath    Shakey   . Doxycycline Hives  . Erythromycin Base Hives    Prior to Admission medications   Medication Sig Start Date End Date Taking? Authorizing Provider  aspirin EC 81 MG EC tablet Take 1 tablet (81 mg total) by mouth daily. 04/02/20   Albertine Grates, MD  atorvastatin  (LIPITOR) 20 MG tablet TAKE 2 TABLETS BY MOUTH EVERY DAY 11/15/20   Georgina Quint, MD  fluticasone [facility]) 50 MCG/ACT nasal spray SPRAY 2 SPRAYS INTO EACH NOSTRIL EVERY DAY 10/30/20   Georgina Quint, MD  lisinopril (ZESTRIL) 20 MG tablet TAKE 1 TABLET BY MOUTH EVERY DAY 11/11/20   Georgina Quint, MD  metFORMIN (GLUCOPHAGE) 1000 MG tablet TAKE 1 TABLET (1,000 MG TOTAL) BY MOUTH 2 (TWO) TIMES DAILY WITH A MEAL. 01/10/21   Georgina Quint, MD  metoprolol succinate (TOPROL XL) 25 MG 24 hr tablet Take 1 tablet (25 mg total) by mouth daily. 09/18/20   Little Ishikawa, MD  sildenafil (VIAGRA) 100 MG tablet Take 0.5-1 tablets (50-100 mg total) by mouth daily as needed for erectile dysfunction. 10/30/20   Georgina Quint, MD    Past Medical History:  Diagnosis Date  . Allergy   . Diabetes mellitus without complication (HCC)   . Elevated cholesterol   . Hernia    bi lateral hernia repair  . History of kidney stones   . Hypertension   . Sleep apnea 2014   severe  . Umbilical hernia without obstruction and without gangrene 09/03/2017    Past Surgical History:  Procedure Laterality Date  . CARPAL TUNNEL RELEASE Right 2007  . CARPAL TUNNEL RELEASE Left 10/28/2014   Procedure: LEFT CARPAL TUNNEL RELEASE;  Surgeon: Dairl Ponder, MD;  Location: [facility];  Service: Orthopedics;  Laterality: Left;  . FOOT SURGERY Left   . INGUINAL HERNIA REPAIR     times two, each side  . KIDNEY STONE SURGERY    . MASS EXCISION Left 10/28/2014   Procedure: LEFT WRIST VOLAR MASS EXCISION;  Surgeon: Dairl Ponder, MD;  Location: [facility];  Service: Orthopedics;  Laterality: Left;  . UMBILICAL HERNIA REPAIR N/A 10/27/2017   Procedure: HERNIA REPAIR UMBILICAL ADULT;  Surgeon: Berna Bue, MD;  Location[facility];  Service: General;  Laterality: N/A;    Social History   Tobacco Use  . Smoking status: Never Smoker  . Smokeless  tobacco: Never Used  Substance Use Topics  . Alcohol use: No    Alcohol/week: 0.0 standard drinks    Family History  Problem Relation Age of Onset  . Diabetes Mother   . Stroke Mother        4 strokes  . Heart attack Mother   . Heart disease Mother   . Hypertension Father   . Hypertension Sister   . Heart disease Sister        pacemaker    Review of Systems  Constitutional: Positive for malaise/fatigue. Negative for chills and fever.  HENT: Positive for congestion and sore throat. Negative for ear discharge and sinus pain.   Respiratory: Positive for cough. Negative for sputum production, shortness of breath and wheezing.   Cardiovascular: Negative for chest pain and palpitations.  Gastrointestinal: Negative for abdominal pain, heartburn, nausea and vomiting.  Musculoskeletal: Negative for myalgias.  Psychiatric/Behavioral: The patient has insomnia.     Objective  Constitutional:      General: Not in acute distress.    Appearance: Normal appearance. Not ill-appearing.   Pulmonary:     Effort: Pulmonary effort is normal. No respiratory distress.  Neurological:     Mental Status: Alert and oriented to person, place, and time.  Psychiatric:        Mood and Affect: Mood normal.        Behavior: Behavior normal.     ASSESSMENT and PLAN  Problem List Items Addressed This Visit      Respiratory   OSA (obstructive sleep apnea) - Primary     Other   Environmental allergies   Relevant Medications   fluticasone (FLONASE) 50 MCG/ACT nasal spray    Other Visit Diagnoses    Cough       Relevant Medications   chlorpheniramine-HYDROcodone (TUSSIONEX PENNKINETIC ER) 10-8 MG/5ML SUER   Other Relevant Orders   COVID-19, Flu A+B and RSV      Plan . Encouraged to talk with sleep doctor about making CPAP settings changes . Take flonase daily  . Cough syrup at night . Continue conservative treatments for symptoms . RTC/ED precautions provided . R/se/b of medications  discussed  Return if symptoms worsen or fail to improve, for Regularly scheduled visit.    The above assessment and management plan was discussed with the patient. The patient verbalized understanding of and has agreed to the management plan. Patient is aware to call the clinic if symptoms persist or worsen. Patient is aware when to return to the clinic for a follow-up visit. Patient educated on when it is appropriate to go to the emergency department.     Macario Carls Kaiana Marion, FNP-BC Primary Care at [facility] [address] [place], [place] [address] Ph.  [phone] Fax [phone]

## 2021-01-11 NOTE — Patient Instructions (Addendum)
 Viral Illness, Adult Viruses are tiny germs that can get into a person's body and cause illness. There are many different types of viruses, and they cause many types of illness. Viral illnesses can range from mild to severe. They can affect various parts of the body. Short-term conditions that are caused by a virus include colds and the flu (influenza). Long-term conditions that are caused by a virus include herpes, shingles, and HIV (human immunodeficiency virus) infection. A few viruses have been linked to certain cancers. What are the causes? Many types of viruses can cause illness. Viruses invade cells in your body, multiply, and cause the infected cells to work abnormally or die. When these cells die, they release more of the virus. When this happens, you develop symptoms of the illness, and the virus continues to spread to other cells. If the virus takes over the function of the cell, it can cause the cell to divide and grow out of control. This happens when a virus causes cancer. Different viruses get into the body in different ways. You can get a virus by:  Swallowing food or water that has come in contact with the virus (is contaminated).  Breathing in droplets that have been coughed or sneezed into the air by an infected person.  Touching a surface that has been contaminated with the virus and then touching your eyes, nose, or mouth.  Being bitten by an insect or animal that carries the virus.  Having sexual contact with a person who is infected with the virus.  Being exposed to blood or fluids that contain the virus, either through an open cut or during a transfusion. If a virus enters your body, your body's defense system (immune system) will try to fight the virus. You may be at higher risk for a viral illness if your immune system is weak. What are the signs or symptoms? You may have these symptoms, depending on the type of virus and the location of the cells that it  invades:  Cold and flu viruses: ? Fever. ? Headache. ? Sore throat. ? Muscle aches. ? Stuffy nose (nasal congestion). ? Cough.  Digestive system (gastrointestinal) viruses: ? Fever. ? Pain in the abdomen. ? Nausea. ? Diarrhea.  Liver viruses (hepatitis): ? Loss of appetite. ? Tiredness. ? Skin or the white parts of your eyes turning yellow (jaundice).  Brain and spinal cord viruses: ? Fever. ? Headache. ? Stiff neck. ? Nausea and vomiting. ? Confusion or sleepiness.  Skin viruses: ? Warts. ? Itching. ? Rash.  Sexually transmitted viruses: ? Discharge. ? Swelling. ? Redness. ? Rash. How is this diagnosed? This condition may be diagnosed based on one or more of the following:  Symptoms.  Medical history.  Physical exam.  Blood test, sample of mucus from your lungs (sputum sample), stool sample, or a swab of body fluids or a skin sore (lesion). How is this treated? Viruses can be hard to treat because they live within cells. Antibiotic medicines do not treat viruses because these medicines do not get inside cells. Treatment for a viral illness may include:  Resting and drinking plenty of fluids.  Medicines to relieve symptoms. These can include over-the-counter medicine for pain and fever, medicines for cough or congestion, and medicines to relieve diarrhea.  Antiviral medicines. These medicines are available only for certain types of viruses. Some viral illnesses can be prevented with vaccinations. A common example is the flu shot. Follow these instructions at home: Medicines  Take over-the-counter   and prescription medicines only as told by your health care provider.  If you were prescribed an antiviral medicine, take it as told by your health care provider. Do not stop taking the antiviral even if you start to feel better.  Be aware of when antibiotics are needed and when they are not needed. Antibiotics do not treat viruses. You may get an antibiotic if  your health care provider thinks that you may have, or are at risk for, a bacterial infection and you have a viral infection. ? Do not ask for an antibiotic prescription if you have been diagnosed with a viral illness. Antibiotics will not make your illness go away faster. ? Frequently taking antibiotics when they are not needed can lead to antibiotic resistance. When this develops, the medicine no longer works against the bacteria that it normally fights. General instructions  Drink enough fluids to keep your urine pale yellow.  Rest as much as possible.  Return to your normal activities as told by your health care provider. Ask your health care provider what activities are safe for you.  Keep all follow-up visits as told by your health care provider. This is important.   How is this prevented? To reduce your risk of viral illness:  Wash your hands often with soap and water for at least 20 seconds. If soap and water are not available, use hand sanitizer.  Avoid touching your nose, eyes, and mouth, especially if you have not washed your hands recently.  If anyone in your household has a viral infection, clean all household surfaces that may have been in contact with the virus. Use soap and hot water. You may also use bleach that you have added water to (diluted).  Stay away from people who are sick with symptoms of a viral infection.  Do not share items such as toothbrushes and water bottles with other people.  Keep your vaccinations up to date. This includes getting a yearly flu shot.  Eat a healthy diet and get plenty of rest.   Contact a health care provider if:  You have symptoms of a viral illness that do not go away.  Your symptoms come back after going away.  Your symptoms get worse. Get help right away if you have:  Trouble breathing.  A severe headache or a stiff neck.  Severe vomiting or pain in your abdomen. These symptoms may represent a serious problem that is  an emergency. Do not wait to see if the symptoms will go away. Get medical help right away. Call your local emergency services (911 in the [place] Do not drive yourself to the hospital. Summary  Viruses are types of germs that can get into a person's body and cause illness. Viral illnesses can range from mild to severe. They can affect various parts of the body.  Viruses can be hard to treat. There are medicines to relieve symptoms, and there are some antiviral medicines.  If you were prescribed an antiviral medicine, take it as told by your health care provider. Do not stop taking the antiviral even if you start to feel better.  Contact a health care provider if you have symptoms of a viral illness that do not go away. This information is not intended to replace advice given to you by your health care provider. Make sure you discuss any questions you have with your health care provider. Document Revised: 04/17/2020 Document Reviewed: 10/12/2019 Elsevier Patient Education  2021 Elsevier Inc.    If you   have lab work done today you will be contacted with your lab results within the next 2 weeks.  If you have not heard from us then please contact us. The fastest way to get your results is to register for My Chart.   IF you received an x-ray today, you will receive an invoice from [facility]. Please contact  Radiology at [phone] with questions or concerns regarding your invoice.   IF you received labwork today, you will receive an invoice from [facility]. Please contact [facility] at [phone] with questions or concerns regarding your invoice.   Our billing staff will not be able to assist you with questions regarding bills from these companies.  You will be contacted with the lab results as soon as they are available. The fastest way to get your results is to activate your My Chart account. Instructions are located on the last page of this paperwork. If you have not  heard from us regarding the results in 2 weeks, please contact this office.      

## 2021-01-11 NOTE — Telephone Encounter (Signed)
Patient called and stated he would not be able to make it in for the covid/ swab due to being out of town. He stated will try medicine to see if it helps .
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
## 2021-01-16 ENCOUNTER — Ambulatory Visit: Payer: BC Managed Care – PPO | Admitting: Cardiology

## 2021-02-06 ENCOUNTER — Other Ambulatory Visit: Payer: Self-pay | Admitting: Emergency Medicine

## 2021-02-06 NOTE — Telephone Encounter (Signed)
Requested Prescriptions  Pending Prescriptions Disp Refills  . atorvastatin (LIPITOR) 20 MG tablet [Pharmacy Med Name: ATORVASTATIN 20 MG TABLET] 180 tablet 0    Sig: TAKE 2 TABLETS BY MOUTH EVERY DAY     Cardiovascular:  Antilipid - Statins Failed - 02/06/2021 12:08 AM      Failed - LDL in normal range and within 360 days    LDL Chol Calc (NIH)  Date Value Ref Range Status  04/13/2020 68 0 - 99 mg/dL Final         Failed - HDL in normal range and within 360 days    HDL  Date Value Ref Range Status  04/13/2020 25 (L) >39 mg/dL Final         Failed - Triglycerides in normal range and within 360 days    Triglycerides  Date Value Ref Range Status  04/13/2020 192 (H) 0 - 149 mg/dL Final         Passed - Total Cholesterol in normal range and within 360 days    Cholesterol, Total  Date Value Ref Range Status  04/13/2020 125 100 - 199 mg/dL Final         Passed - Patient is not pregnant      Passed - Valid encounter within last 12 months    Recent Outpatient Visits          3 weeks ago OSA (obstructive sleep apnea)   [facility] [place] [facility], Azalee Course, FNP   3 months ago Hypertension associated with diabetes [facility])   Primary [facility] at [facility], [place], MD   6 months ago Hypertension associated with diabetes [facility])   [facility] at [place], [place], MD   9 months ago Hypertension associated with diabetes [facility])   Primary [facility] at [place], Eilleen Kempf, MD   1 year ago Hypertension associated with diabetes [facility])   Primary Care at [facility], Eilleen Kempf, MD      Future Appointments            In 2 weeks Little Ishikawa, MD [facility] [place], [facility]   In 2 months [place], Eilleen Kempf, MD [facility] at [place], [facility]

## 2021-02-18 NOTE — Progress Notes (Unsigned)
Cardiology Office Note:    Date:  02/20/2021   ID:  Walter Males., DOB 1971/11/02, MRN [id number]  PCP:  Georgina Quint, MD  Cardiologist:  Little Ishikawa, MD  Electrophysiologist:  None   Referring MD: Georgina Quint, *   Chief Complaint  Patient presents with  . Coronary Artery Disease    History of Present Illness:    Walter Hardgrove. is a 50 y.o. male with a hx of hypertension, type 2 diabetes, obesity, hyperlipidemia, OSA who presents for follow-up.  He presented to the ED on 03/31/2020 with chest pain.  Reports that he was mowing his grass that day when he developed chest pain.  States that he felt his heart was racing and as he continued moving his symptoms worsen.  He eventually had to stop and his symptoms resolved over time.  On presentation to the ED, EKG revealed no ST/T wave abnormalities.  High-sensitivity troponin was 6 > 26 >18.  Coronary CTA was done on 04/01/2020 which showed calcium score 110 (95th percentile), nonobstructive CAD with mixed plaque in the proximal RCA causing mild (25 to 49%) stenosis.  5 mm pulmonary nodule also seen.  Echocardiogram on 05/24/2020 showed LVEF 45 to 50%, global hypokinesis, grade 1 diastolic dysfunction, normal RV function, no significant valvular disease.   Zio patch x14 days on 05/20/2020 showed 2 episodes of NSVT, longest lasting 6 beats.  Triggered events corresponded to sinus rhythm.  Cardiac MRI on 10/25/2020 showed normal LV function (EF 50%), normal RV function (EF 55%), no LGE.    Since last clinic visit, he reports that he has been doing okay.  Reports occasional right-sided chest pain.  Does report he has been having pain in his legs.  Reports he has been having issues with his CPAP machine, supposed to get a new machine but has been waiting for months.  He denies any lightheadedness, syncope, palpitations, lower extremity edema.  Reports he was exercising regularly in January but none since then.   Past  Medical History:  Diagnosis Date  . Allergy   . Diabetes mellitus without complication (HCC)   . Elevated cholesterol   . Hernia    bi lateral hernia repair  . History of kidney stones   . Hypertension   . Sleep apnea 2014   severe  . Umbilical hernia without obstruction and without gangrene 09/03/2017    Past Surgical History:  Procedure Laterality Date  . CARPAL TUNNEL RELEASE Right 2007  . CARPAL TUNNEL RELEASE Left 10/28/2014   Procedure: LEFT CARPAL TUNNEL RELEASE;  Surgeon: Dairl Ponder, MD;  Location: [facility];  Service: Orthopedics;  Laterality: Left;  . FOOT SURGERY Left   . INGUINAL HERNIA REPAIR     times two, each side  . KIDNEY STONE SURGERY    . MASS EXCISION Left 10/28/2014   Procedure: LEFT WRIST VOLAR MASS EXCISION;  Surgeon: Dairl Ponder, MD;  Location: [facility];  Service: Orthopedics;  Laterality: Left;  . UMBILICAL HERNIA REPAIR N/A 10/27/2017   Procedure: HERNIA REPAIR UMBILICAL ADULT;  Surgeon: Berna Bue, MD;  Location: [facility];  Service: General;  Laterality: N/A;    Current Medications: Current Meds  Medication Sig  . aspirin EC 81 MG EC tablet Take 1 tablet (81 mg total) by mouth daily.  Marland Kitchen atorvastatin (LIPITOR) 20 MG tablet TAKE 2 TABLETS BY MOUTH EVERY DAY  . fluticasone (FLONASE) 50 MCG/ACT nasal spray SPRAY 2 SPRAYS INTO EACH NOSTRIL EVERY  DAY  . lisinopril (ZESTRIL) 20 MG tablet TAKE 1 TABLET BY MOUTH EVERY DAY  . metFORMIN (GLUCOPHAGE) 1000 MG tablet TAKE 1 TABLET (1,000 MG TOTAL) BY MOUTH 2 (TWO) TIMES DAILY WITH A MEAL.  . metoprolol succinate (TOPROL XL) 25 MG 24 hr tablet Take 1 tablet (25 mg total) by mouth daily.  . sildenafil (VIAGRA) 100 MG tablet Take 0.5-1 tablets (50-100 mg total) by mouth daily as needed for erectile dysfunction.     Allergies:   Glipizide, Doxycycline, and Erythromycin base   Social History   Socioeconomic History  . Marital status: Married    Spouse name: Walter Reynolds  . Number of children: 2  . Years of education: Associates  . Highest education level: Not on file  Occupational History  . Occupation: Merchandiser, retail    Comment: [employer]  Tobacco Use  . Smoking status: Never Smoker  . Smokeless tobacco: Never Used  Vaping Use  . Vaping Use: Never used  Substance and Sexual Activity  . Alcohol use: No    Alcohol/week: 0.0 standard drinks  . Drug use: No  . Sexual activity: Yes    Partners: Female    Birth control/protection: Condom  Other Topics Concern  . Not on file  Social History Narrative   Lives with his wife and their 2 children.   He has 3 sons from a previous relationship that live independently.   Formerly in the [employer].   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   Social Determinants of Health   Financial Resource Strain: Not on file  Food Insecurity: Not on file  Transportation Needs: Not on file  Physical Activity: Not on file  Stress: Not on file  Social Connections: Not on file     Family History: The patient's family history includes Diabetes in his mother; Heart attack in his mother; Heart disease in his mother and sister; Hypertension in his father and sister; Stroke in his mother.  ROS:   Please see the history of present illness.     All other systems reviewed and are negative.  EKGs/Labs/Other Studies Reviewed:    The following studies were reviewed today:   EKG:  EKG is not ordered today. The EKG ordered at prior clinic visit shows sinus sinus rhythm, rate 88, no ST abnormalities  Coronary CTA 04/01/20: 1. Coronary calcium score of 110. This was 95th percentile for age and sex matched control.  2. Normal coronary origin with right dominance.  3. Nonobstructive CAD with mixed plaque in the proximal RCA causing mild (25-49%) stenosis  CAD-RADS 1. Minimal non-obstructive CAD (0-24%).  Consider non-atherosclerotic causes of chest pain. Consider preventive therapy and risk factor modification.  1. No acute abnormality. 2. Small peripheral densities along the lower lobes are nonspecific and likely related to atelectasis. 5 mm peripheral nodule in the left lower lobe is indeterminate. No follow-up needed if patient is low-risk. Non-contrast chest CT can be considered in 12 months if patient is high-risk. This recommendation follows the consensus statement: Guidelines for Management of Incidental Pulmonary Nodules Detected on CT Images: From the Fleischner Society 2017; Radiology 2017; 284:228-243. 3. Visualized liver has low-density and suspicious for hepatic steatosis.   Recent Labs: 04/01/2020: Magnesium 1.7; TSH 1.391 04/13/2020: ALT 30; BUN 12; Creatinine, Ser 1.01; Hemoglobin 13.2; Platelets 292; Potassium 4.3; Sodium 140  Recent Lipid Panel    Component Value Date/Time   CHOL 125 04/13/2020  1532   TRIG 192 (H) 04/13/2020 1532   HDL 25 (L) 04/13/2020 1532   CHOLHDL 5.0 04/13/2020 1532   CHOLHDL 4.8 05/02/2016 0855   VLDL 17 05/02/2016 0855   LDLCALC 68 04/13/2020 1532    Physical Exam:    VS:  BP 124/78   Pulse 74   Ht 5\' 7"  (1.702 m)   Wt 261 lb (118.4 kg)   SpO2 96%   BMI 40.88 kg/m     Wt Readings from Last 3 Encounters:  02/20/21 261 lb (118.4 kg)  10/30/20 259 lb (117.5 kg)  09/20/20 258 lb (117 kg)     GEN:   in no acute distress HEENT: Normal NECK: No JVD CARDIAC: RRR, no murmurs, rubs, gallops RESPIRATORY:  Clear to auscultation without rales, wheezing or rhonchi  ABDOMEN: Soft, non-tender, non-distended MUSCULOSKELETAL:  No edema; No deformity  SKIN: Warm and dry NEUROLOGIC:  Alert and oriented x 3 PSYCHIATRIC:  Normal affect   ASSESSMENT:    1. CAD in native artery   2. Systolic dysfunction   3. Essential hypertension   4. Hyperlipidemia, unspecified hyperlipidemia type   5. Leg pain, bilateral   6. Pulmonary nodule     PLAN:    CAD: Presented with chest pain to ED on 03/31/2020, coronary CTA showed  calcium score 110 (95th percentile), nonobstructive CAD with mixed plaque in the proximal RCA causing mild (25 to 49%) stenosis.   -Continue ASA 81 mg daily -Continue atorvastatin 40 mg daily  Systolic dysfunction: Echocardiogram 05/24/2020 showed LVEF 45 to 50%.  Currently on lisinopril 20 mg daily and toprol XL 25 mg daily.  Cardiac MRI on 10/25/2020 showed normal LV function (EF 50%), normal RV function (EF 55%), no LGE. -Continue lisinopril and metoprolol  Palpitations: Zio patch x14 days on 05/20/2020 showed 2 episodes of NSVT, longest lasting 6 beats.  Triggered events corresponded to sinus rhythm.  Hypertension: Continue lisinopril 20 mg daily and Toprol-XL 25 mg daily.  Appears controlled.  Will check CMP  Pulmonary nodule: 5 mm nodule noted on recent coronary CTA.  Given smoking history, will check repeat CT chest in 1 year (due April 2022)  Type 2 diabetes: On Metformin.  A1c 6.6 on 10/30/2020  Hyperlipidemia: LDL 68 on 04/13/2020.  On atorvastatin 40 mg daily.  Will repeat lipid panel  Leg pain: Will check ABIs  RTC in 6 months  Medication Adjustments/Labs and Tests Ordered: Current medicines are reviewed at length with the patient today.  Concerns regarding medicines are outlined above.  Orders Placed This Encounter  Procedures  . Comprehensive metabolic panel  . Lipid panel  . VAS US ABI WITH/WO TBI  . VAS US LOWER EXTREMITY ARTERIAL DUPLEX   No orders of the defined types were placed in this encounter.   Patient Instructions  Medication Instructions:  Your physician recommends that you continue on your current medications as directed. Please refer to the Current Medication list given to you today.  *If you need a refill on your cardiac medications before your next appointment, please call your pharmacy*   Lab Work: CMET, Lipid today  If you have labs (blood work) drawn today and  your tests are completely normal, you will receive your results only by: Marland Kitchen. MyChart Message (if you have MyChart) OR . A paper copy in the mail If you have any lab test that is abnormal or we need to change your treatment, we will call you to review the results.   Testing/Procedures: Your physician  has requested that you have an ankle brachial index (ABI). During this test an ultrasound and blood pressure cuff are used to evaluate the arteries that supply the arms and legs with blood. Allow thirty minutes for this exam. There are no restrictions or special instructions.  CT chest without contrast in April 2022   Follow-Up: At [facility], you and your health needs are our priority.  As part of our continuing mission to provide you with exceptional heart care, we have created designated Provider Care Teams.  These Care Teams include your primary Cardiologist (physician) and Advanced Practice Providers (APPs -  Physician Assistants and Nurse Practitioners) who all work together to provide you with the care you need, when you need it.  We recommend signing up for the patient portal called "MyChart".  Sign up information is provided on this After Visit Summary.  MyChart is used to connect with patients for Virtual Visits (Telemedicine).  Patients are able to view lab/test results, encounter notes, upcoming appointments, etc.  Non-urgent messages can be sent to your provider as well.   To learn more about what you can do with MyChart, go to [email or web address].    Your next appointment:   6 month(s)  The format for your next appointment:   In Person  Provider:   Epifanio Lesches, MD       Signed, Little Ishikawa, MD  02/20/2021 8:28 AM    [facility]

## 2021-02-20 ENCOUNTER — Ambulatory Visit: Payer: BC Managed Care – PPO | Admitting: Cardiology

## 2021-02-20 ENCOUNTER — Encounter: Payer: Self-pay | Admitting: Cardiology

## 2021-02-20 ENCOUNTER — Other Ambulatory Visit: Payer: Self-pay

## 2021-02-20 VITALS — BP 124/78 | HR 74 | Ht 67.0 in | Wt 261.0 lb

## 2021-02-20 DIAGNOSIS — I10 Essential (primary) hypertension: Secondary | ICD-10-CM | POA: Diagnosis not present

## 2021-02-20 DIAGNOSIS — E785 Hyperlipidemia, unspecified: Secondary | ICD-10-CM | POA: Diagnosis not present

## 2021-02-20 DIAGNOSIS — M79604 Pain in right leg: Secondary | ICD-10-CM

## 2021-02-20 DIAGNOSIS — R911 Solitary pulmonary nodule: Secondary | ICD-10-CM

## 2021-02-20 DIAGNOSIS — I2510 Atherosclerotic heart disease of native coronary artery without angina pectoris: Secondary | ICD-10-CM | POA: Diagnosis not present

## 2021-02-20 DIAGNOSIS — I519 Heart disease, unspecified: Secondary | ICD-10-CM | POA: Diagnosis not present

## 2021-02-20 DIAGNOSIS — M79605 Pain in left leg: Secondary | ICD-10-CM

## 2021-02-20 LAB — COMPREHENSIVE METABOLIC PANEL
ALT: 35 IU/L (ref 0–44)
AST: 24 IU/L (ref 0–40)
Albumin/Globulin Ratio: 1.7 (ref 1.2–2.2)
Albumin: 4.6 g/dL (ref 3.8–4.9)
Alkaline Phosphatase: 100 IU/L (ref 44–121)
BUN/Creatinine Ratio: 15 (ref 9–20)
BUN: 14 mg/dL (ref 6–24)
Bilirubin Total: 0.3 mg/dL (ref 0.0–1.2)
CO2: 24 mmol/L (ref 20–29)
Calcium: 9.9 mg/dL (ref 8.7–10.2)
Chloride: 102 mmol/L (ref 96–106)
Creatinine, Ser: 0.94 mg/dL (ref 0.76–1.27)
Globulin, Total: 2.7 g/dL (ref 1.5–4.5)
Glucose: 129 mg/dL — ABNORMAL HIGH (ref 65–99)
Potassium: 4.8 mmol/L (ref 3.5–5.2)
Sodium: 140 mmol/L (ref 134–144)
Total Protein: 7.3 g/dL (ref 6.0–8.5)
eGFR: 98 mL/min/{1.73_m2} (ref >59)

## 2021-02-20 LAB — LIPID PANEL
Chol/HDL Ratio: 4.6 ratio (ref 0.0–5.0)
Cholesterol, Total: 128 mg/dL (ref 100–199)
HDL: 28 mg/dL — ABNORMAL LOW (ref >39)
LDL Chol Calc (NIH): 67 mg/dL (ref 0–99)
Triglycerides: 194 mg/dL — ABNORMAL HIGH (ref 0–149)
VLDL Cholesterol Cal: 33 mg/dL (ref 5–40)

## 2021-02-20 NOTE — Patient Instructions (Signed)
Medication Instructions:  Your physician recommends that you continue on your current medications as directed. Please refer to the Current Medication list given to you today.  *If you need a refill on your cardiac medications before your next appointment, please call your pharmacy*   Lab Work: CMET, Lipid today  If you have labs (blood work) drawn today and your tests are completely normal, you will receive your results only by: Marland Kitchen MyChart Message (if you have MyChart) OR . A paper copy in the mail If you have any lab test that is abnormal or we need to change your treatment, we will call you to review the results.   Testing/Procedures: Your physician has requested that you have an ankle brachial index (ABI). During this test an ultrasound and blood pressure cuff are used to evaluate the arteries that supply the arms and legs with blood. Allow thirty minutes for this exam. There are no restrictions or special instructions.  CT chest without contrast in April 2022   Follow-Up: At [facility], you and your health needs are our priority.  As part of our continuing mission to provide you with exceptional heart care, we have created designated Provider Care Teams.  These Care Teams include your primary Cardiologist (physician) and Advanced Practice Providers (APPs -  Physician Assistants and Nurse Practitioners) who all work together to provide you with the care you need, when you need it.  We recommend signing up for the patient portal called "MyChart".  Sign up information is provided on this After Visit Summary.  MyChart is used to connect with patients for Virtual Visits (Telemedicine).  Patients are able to view lab/test results, encounter notes, upcoming appointments, etc.  Non-urgent messages can be sent to your provider as well.   To learn more about what you can do with MyChart, go to [email or web address].    Your next appointment:   6 month(s)  The format for your next  appointment:   In Person  Provider:   Epifanio Lesches, MD

## 2021-02-28 DIAGNOSIS — G4733 Obstructive sleep apnea (adult) (pediatric): Secondary | ICD-10-CM | POA: Diagnosis not present

## 2021-03-05 ENCOUNTER — Other Ambulatory Visit: Payer: BC Managed Care – PPO

## 2021-03-13 ENCOUNTER — Other Ambulatory Visit: Payer: Self-pay

## 2021-03-13 ENCOUNTER — Ambulatory Visit (HOSPITAL_COMMUNITY)
Admission: RE | Admit: 2021-03-13 | Discharge: 2021-03-13 | Disposition: A | Discharge status: Home / Self Care | Payer: BC Managed Care – PPO | Source: Ambulatory Visit | Attending: Internal Medicine | Admitting: Internal Medicine

## 2021-03-13 DIAGNOSIS — M79605 Pain in left leg: Secondary | ICD-10-CM

## 2021-03-13 DIAGNOSIS — M79604 Pain in right leg: Secondary | ICD-10-CM | POA: Diagnosis not present

## 2021-03-19 ENCOUNTER — Other Ambulatory Visit: Payer: Self-pay

## 2021-03-19 ENCOUNTER — Ambulatory Visit (INDEPENDENT_AMBULATORY_CARE_PROVIDER_SITE_OTHER)
Admission: RE | Admit: 2021-03-19 | Discharge: 2021-03-19 | Disposition: A | Discharge status: Home / Self Care | Payer: BC Managed Care – PPO | Source: Ambulatory Visit | Attending: Cardiology | Admitting: Cardiology

## 2021-03-19 ENCOUNTER — Telehealth: Payer: Self-pay | Admitting: *Deleted

## 2021-03-19 DIAGNOSIS — N62 Hypertrophy of breast: Secondary | ICD-10-CM | POA: Diagnosis not present

## 2021-03-19 DIAGNOSIS — R918 Other nonspecific abnormal finding of lung field: Secondary | ICD-10-CM | POA: Diagnosis not present

## 2021-03-19 DIAGNOSIS — R911 Solitary pulmonary nodule: Secondary | ICD-10-CM | POA: Diagnosis not present

## 2021-03-19 DIAGNOSIS — I2510 Atherosclerotic heart disease of native coronary artery without angina pectoris: Secondary | ICD-10-CM | POA: Diagnosis not present

## 2021-03-19 NOTE — Telephone Encounter (Signed)
CT results received   IMPRESSION: 1. There is a nodule in the left lower lobe on series 3, image 79 thought to correlate with ill-defined nodule seen on the previous study. The nodule measures 5 mm on both studies but is much more conspicuous on today's study. That being said, there was some artifact in this region previously and the slices are thinner today. The difference could be due to technical factors. Recommend a six-month follow-up CT scan to assess for stability. 2. Calcified atherosclerosis in the right coronary artery. 3. Mild gynecomastia.  These results will be called to the ordering clinician or representative by the Radiologist Assistant, and communication documented in the PACS or [employer].  Will forward to Dr Bjorn Pippin for review

## 2021-03-21 ENCOUNTER — Other Ambulatory Visit: Payer: Self-pay | Admitting: *Deleted

## 2021-03-21 DIAGNOSIS — R911 Solitary pulmonary nodule: Secondary | ICD-10-CM

## 2021-03-21 NOTE — Telephone Encounter (Signed)
See result note.  

## 2021-03-24 ENCOUNTER — Other Ambulatory Visit: Payer: Self-pay | Admitting: Cardiology

## 2021-03-26 NOTE — Telephone Encounter (Signed)
This is Dr. Schumann's pt 

## 2021-03-27 ENCOUNTER — Ambulatory Visit (HOSPITAL_COMMUNITY)
Admission: EM | Admit: 2021-03-27 | Discharge: 2021-03-27 | Disposition: A | Discharge status: Home / Self Care | Payer: BC Managed Care – PPO | Attending: Student | Admitting: Student

## 2021-03-27 ENCOUNTER — Encounter (HOSPITAL_COMMUNITY): Payer: Self-pay | Admitting: *Deleted

## 2021-03-27 ENCOUNTER — Ambulatory Visit (INDEPENDENT_AMBULATORY_CARE_PROVIDER_SITE_OTHER): Payer: BC Managed Care – PPO

## 2021-03-27 ENCOUNTER — Other Ambulatory Visit: Payer: Self-pay

## 2021-03-27 DIAGNOSIS — M25561 Pain in right knee: Secondary | ICD-10-CM | POA: Diagnosis not present

## 2021-03-27 DIAGNOSIS — W19XXXA Unspecified fall, initial encounter: Secondary | ICD-10-CM

## 2021-03-27 DIAGNOSIS — S8001XA Contusion of right knee, initial encounter: Secondary | ICD-10-CM

## 2021-03-27 MED ORDER — IBUPROFEN 800 MG PO TABS: 800.0000 mg | ORAL_TABLET | Freq: Three times a day (TID) | ORAL | 0 refills | Status: DC

## 2021-03-27 NOTE — ED Triage Notes (Signed)
Pt reports he was putting in a floor and when he stepped on a board the board flipped up . Pt reports RT knee started today. Pt limping walking to room.

## 2021-03-27 NOTE — Discharge Instructions (Signed)
-  Keep your knee brace on while you are having pain. -Do not do anything that causes pain.  If walking causes pain, limit this. -Follow-up with orthopedist in about 1 week.  Information below. -Take Tylenol 1000 mg 3 times daily, and ibuprofen 800 mg 3 times daily with food.  You can take these together, or alternate every 3-4 hours.

## 2021-03-27 NOTE — ED Provider Notes (Signed)
[facility]    CSN: [id number] Arrival date & time: 03/27/21  1918      History   Chief Complaint Chief Complaint  Patient presents with  . Leg Pain  . Knee Injury    HPI Walter Reynolds. is a 50 y.o. male  Presenting with R knee pain following fall. Medical history includes diabetes, hypertension, sleep apnea, morbid obesity.  States he was putting down a floor and stepped on a board and the board flipped up, causing him to fall through and he landed on his knee.  States that initially he had some pain but was able to ambulate without issue.  However states that throughout the day his pain worsened to the point where he is having trouble walking on the knee.  Ibuprofen 800 mg is providing sufficient relief.  Denies sensation changes, weakness in arms or legs, back pain, pain elsewhere.  HPI  Past Medical History:  Diagnosis Date  . Allergy   . Diabetes mellitus without complication (HCC)   . Elevated cholesterol   . Hernia    bi lateral hernia repair  . History of kidney stones   . Hypertension   . Sleep apnea 2014   severe  . Umbilical hernia without obstruction and without gangrene 09/03/2017    Patient Active Problem List   Diagnosis Date Noted  . Body mass index (BMI) of 40.1-44.9 in adult (HCC) 07/17/2020  . Hypertension associated with diabetes (HCC) 09/27/2019  . Morbid obesity (HCC) 09/27/2019  . Dyslipidemia 09/09/2018  . Environmental allergies 09/09/2018  . BMI 39.0-39.9,adult 09/03/2017  . Dyslipidemia associated with type 2 diabetes mellitus (HCC) 09/03/2017  . Anemia 04/14/2017  . Hyperlipidemia 05/02/2016  . OSA (obstructive sleep apnea) 09/06/2015  . Benign essential HTN 09/06/2015    Past Surgical History:  Procedure Laterality Date  . CARPAL TUNNEL RELEASE Right 2007  . CARPAL TUNNEL RELEASE Left 10/28/2014   Procedure: LEFT CARPAL TUNNEL RELEASE;  Surgeon: Dairl Ponder, MD;  Location: [facility];  Service:  Orthopedics;  Laterality: Left;  . FOOT SURGERY Left   . INGUINAL HERNIA REPAIR     times two, each side  . KIDNEY STONE SURGERY    . MASS EXCISION Left 10/28/2014   Procedure: LEFT WRIST VOLAR MASS EXCISION;  Surgeon: Dairl Ponder, MD;  Location: [facility];  Service: Orthopedics;  Laterality: Left;  . UMBILICAL HERNIA REPAIR N/A 10/27/2017   Procedure: HERNIA REPAIR UMBILICAL ADULT;  Surgeon: Berna Bue, MD;  Location: [facility];  Service: General;  Laterality: N/A;       Home Medications    Prior to Admission medications   Medication Sig Start Date End Date Taking? Authorizing Provider  ibuprofen (ADVIL) 800 MG tablet Take 1 tablet (800 mg total) by mouth 3 (three) times daily. 03/27/21  Yes Rhys Martini, PA-C  aspirin EC 81 MG EC tablet Take 1 tablet (81 mg total) by mouth daily. 04/02/20   Albertine Grates, MD  atorvastatin (LIPITOR) 20 MG tablet TAKE 2 TABLETS BY MOUTH EVERY DAY 02/06/21   Georgina Quint, MD  fluticasone [facility]) 50 MCG/ACT nasal spray SPRAY 2 SPRAYS INTO EACH NOSTRIL EVERY DAY 01/11/21   Just, Azalee Course, FNP  lisinopril (ZESTRIL) 20 MG tablet TAKE 1 TABLET BY MOUTH EVERY DAY 11/11/20   Georgina Quint, MD  metFORMIN (GLUCOPHAGE) 1000 MG tablet TAKE 1 TABLET (1,000 MG TOTAL) BY MOUTH 2 (TWO) TIMES DAILY WITH A MEAL. 01/10/21   Sagardia,  Eilleen Kempf, MD  metoprolol succinate (TOPROL-XL) 25 MG 24 hr tablet TAKE 1 TABLET BY MOUTH EVERY DAY 03/26/21   Little Ishikawa, MD  sildenafil (VIAGRA) 100 MG tablet Take 0.5-1 tablets (50-100 mg total) by mouth daily as needed for erectile dysfunction. 10/30/20   Georgina Quint, MD    Family History Family History  Problem Relation Age of Onset  . Diabetes Mother   . Stroke Mother        4 strokes  . Heart attack Mother   . Heart disease Mother   . Hypertension Father   . Hypertension Sister   . Heart disease Sister        pacemaker    Social History Social History   Tobacco  Use  . Smoking status: Never Smoker  . Smokeless tobacco: Never Used  Vaping Use  . Vaping Use: Never used  Substance Use Topics  . Alcohol use: No    Alcohol/week: 0.0 standard drinks  . Drug use: No     Allergies   Glipizide, Doxycycline, and Erythromycin base   Review of Systems Review of Systems  Musculoskeletal:       Right knee pain  All other systems reviewed and are negative.    Physical Exam Triage Vital Signs ED Triage Vitals  Enc Vitals Group     BP      Pulse      Resp      Temp      Temp src      SpO2      Weight      Height      Head Circumference      Peak Flow      Pain Score      Pain Loc      Pain Edu?      Excl. in GC?    No data found.  Updated Vital Signs BP 136/81 (BP Location: Right Arm)   Pulse 88   Temp 98.3 F (36.8 C) (Oral)   Resp 20   SpO2 97%   Visual Acuity Right Eye Distance:   Left Eye Distance:   Bilateral Distance:    Right Eye Near:   Left Eye Near:    Bilateral Near:     Physical Exam Vitals reviewed.  Constitutional:      General: He is not in acute distress.    Appearance: Normal appearance. He is not ill-appearing or diaphoretic.  HENT:     Head: Normocephalic and atraumatic.  Cardiovascular:     Rate and Rhythm: Normal rate and regular rhythm.     Heart sounds: Normal heart sounds.  Pulmonary:     Effort: Pulmonary effort is normal.     Breath sounds: Normal breath sounds.  Musculoskeletal:     Comments: Right knee with diffuse effusion and tenderness.  Tender to palpation along lateral joint line and patellar tendon.  Range of motion intact but with pain.  Able to ambulate but with pain.  No joint laxity.  Exam limited due to pain.  Neurovascularly intact.  No obvious bony deformity.  Absolutely no other injury, deformity, swelling, tenderness.  Skin:    General: Skin is warm.  Neurological:     General: No focal deficit present.     Mental Status: He is alert and oriented to person, place,  and time.  Psychiatric:        Mood and Affect: Mood normal.        Behavior: Behavior normal.  Thought Content: Thought content normal.        Judgment: Judgment normal.      UC Treatments / Results  Labs (all labs ordered are listed, but only abnormal results are displayed) Labs Reviewed - No data to display  EKG   Radiology DG Knee Complete 4 Views Right  Result Date: 03/27/2021 CLINICAL DATA:  Post fall with right knee pain. EXAM: RIGHT KNEE - COMPLETE 4+ VIEW COMPARISON:  None. FINDINGS: No evidence of fracture, dislocation, or joint effusion. Normal alignment and joint spaces. Mild patellofemoral spurring. Mild medial soft tissue edema. IMPRESSION: 1. Mild medial soft tissue edema. 2. No fracture or subluxation. 3. Mild patellofemoral spurring. Electronically Signed   By: Narda Rutherford M.D.   On: 03/27/2021 20:08    Procedures Procedures (including critical care time)  Medications Ordered in UC Medications - No data to display  Initial Impression / Assessment and Plan / UC Course  I have reviewed the triage vital signs and the nursing notes.  Pertinent labs & imaging results that were available during my care of the patient were reviewed by me and considered in my medical decision making (see chart for details).     This patient is a 50 year old male presenting with right knee contusion following fall.  Neurovascularly intact.  Xray right knee-negative for acute bony abnormality.  Some osteoarthritis.  Significant soft tissue swelling. Films interpreted by myself and radiologist.  Knee brace provided.  I recommended crutches multiple times, but patient adamantly declines these multiple times.  He understands that he should not do anything that causes pain, including walking.  Follow-up with orthopedist in 5 to 7 days.  ED return precautions discussed.  Final Clinical Impressions(s) / UC Diagnoses   Final diagnoses:  Contusion of right knee, initial  encounter  Fall, initial encounter     Discharge Instructions     -Keep your knee brace on while you are having pain. -Do not do anything that causes pain.  If walking causes pain, limit this. -Follow-up with orthopedist in about 1 week.  Information below. -Take Tylenol 1000 mg 3 times daily, and ibuprofen 800 mg 3 times daily with food.  You can take these together, or alternate every 3-4 hours.     ED Prescriptions    Medication Sig Dispense Auth. Provider   ibuprofen (ADVIL) 800 MG tablet Take 1 tablet (800 mg total) by mouth 3 (three) times daily. 21 tablet Rhys Martini, PA-C     PDMP not reviewed this encounter.   Rhys Martini, PA-C 03/27/21 2020

## 2021-03-31 DIAGNOSIS — G4733 Obstructive sleep apnea (adult) (pediatric): Secondary | ICD-10-CM | POA: Diagnosis not present

## 2021-04-06 ENCOUNTER — Ambulatory Visit: Payer: BC Managed Care – PPO | Admitting: Family Medicine

## 2021-04-06 ENCOUNTER — Other Ambulatory Visit: Payer: Self-pay

## 2021-04-06 VITALS — BP 130/82 | Ht 67.0 in | Wt 260.0 lb

## 2021-04-06 DIAGNOSIS — M7581 Other shoulder lesions, right shoulder: Secondary | ICD-10-CM | POA: Diagnosis not present

## 2021-04-06 DIAGNOSIS — M25561 Pain in right knee: Secondary | ICD-10-CM | POA: Diagnosis not present

## 2021-04-06 NOTE — Patient Instructions (Signed)
It was great to see you today.  Here is a quick review of the things we talked about:  Knee pain: Does not look like you have any muscular or ligamentous injury to your knee.  There is not significant fluid inside of your knee joint.  For now, trying to hold some compression to your lower leg to help with the swelling is probably one of the most helpful things we can do.  I recommend you continue to use ibuprofen and Tylenol for pain control.  But see you back in 3 weeks to make sure you continue getting better.  At that time, we may be able to talk more about that clicking sensation in both of your knees.  Shoulder pain: It does seem that you have some rotator cuff injury.  Ultrasound today showed no evidence of muscle tear or serious injury.  We will send you to physical therapy for this issue and see how you are doing in 3 weeks.

## 2021-04-06 NOTE — Progress Notes (Signed)
PCP: Georgina Quint, MD  Subjective:   HPI: Patient is a 50 y.o. male here for shoulder and knee pain.  Right knee pain Mr. Walter Reynolds was last seen in the emergency room about 1 week ago following a Holiday representative site injury.  He was stepping onto a board which did not hold steady and ultimately fell through the board, landing hard on his right knee with some minor injury to his left knee and right shoulder.  He was seen in the emergency room at which time a right knee x-ray was done that showed no bony abnormality.  He was given a knee brace and encouraged to follow-up with a physician in the next week or so.  In clinic today, he reports that he has been walking around in going to work as usual with continued knee discomfort.  He notices his knee pain most when he has to kneel on something and apply direct pressure to his bruised knee.  Other than that, he has mild discomfort with walking and using the stairs but no serious issue.  He denies locking, catching or giving out of his knee.  The discomfort he now has with his knee he describes as a tightness or dull discomfort that seems to be worse at the end of the day and when he spends a lot of time standing.  Knee clicking This is not causing him any significant discomfort.  He notices that he has had long-term knee clicking in both knees with extension of the knee.  He has not previously noted that this causes significant pain but he brought it up because he is at the sports medicine doctor today.  Right shoulder This shoulder injury was from the same event as the above-noted knee injury.  His shoulder was not significantly bothering him in the emergency room and was not worked up at that time.  Since his injury, he has noted some discomfort with trying to lift his right arm to the side.  He has previously had rotator cuff trouble with his other shoulder.  Review of Systems:  Per HPI.   PMFSH, medications and smoking status  reviewed.      Objective:  Physical Exam:  No flowsheet data found.   Gen: awake, alert, NAD, comfortable in exam room Pulm: breathing unlabored  Right knee: - Inspection: Impressive ecchymosis present on the medial aspect of his right leg extending from mid thigh to mid shin.  Focused swelling noted on the medial aspect of his tibial plateau.   - Palpation: Mild tenderness to palpation over bruised area and the lump noted along his medial tibial plateau. - ROM: full active ROM with flexion and extension in knee.  Audible and tactile clicking noted along the lateral, superior aspect of his patella. - Strength: 5/5 strength - Neuro/vasc: NV intact - Special Tests: - LIGAMENTS: negative anterior and posterior drawer, no MCL or LCL laxity  -- MENISCUS: , negative Thessaly  -- PF JOINT: nml patellar mobility bilaterally.    Shoulder: Inspection reveals no obvious deformity, atrophy, or asymmetry. No bruising. No swelling Palpation is normal with no TTP over [facility] joint or bicipital groove. Full ROM in flexion, abduction, external rotation.  Limited internal rotation of the right shoulder. NV intact distally Normal scapular function observed. Special Tests:  - Impingement: Neg neers.  Moderate discomfort with Juanetta Gosling  - Supraspinatous: Moderate discomfort with empty can test.  5/5 strength with resisted flexion at 20 degrees - Infraspinatous/Teres Minor: 5/5 strength with ER -  Subscapularis: negative belly press, negative bear hug. 5/5 strength with IR - Biceps tendon: Negative Speeds, Yerrgason's      Assessment & Plan:  1.  Right knee pain Physical exam appears most consistent with a superficial injury.  No evidence of muscular weakness, joint laxity or significant joint effusion.  Ultrasound verified no significant joint effusion in addition to hematoma over the medial tibial plateau.  At this time, it seems that his discomfort is related to the increased swelling around his knee  and lower leg.  He was encouraged to use an Ace wrap for compression and continue to use over-the-counter ibuprofen and Tylenol for pain control.  Return to clinic in 3 weeks to reassess.  2.  Right shoulder pain Possible rotator cuff injury versus impingement.  He does not demonstrate any significant weakness though does have some discomfort with impingement testing.  Ultrasound did not demonstrate any evidence of partial or full-thickness tear of the rotator cuff.  For now, we will continue to use ibuprofen for antipronation and send to physical therapy.  Follow-up in 3 weeks.  3.  Knee clicking, bilateral We did not fully address this issue.  It is not currently causing significant discomfort.  His acute trauma injuries seem to be more concerning at this time.  He was encouraged to follow-up in 3 weeks and we might be able to spend more time on this issue.     Mirian Mo, MD PGY-3 04/06/2021 10:47 AM  Resident Attestation   I saw and evaluated the patient, performing the key elements of the service.I  personally performed or re-performed the history, physical exam, and medical decision making activities of this service and have verified that the service and findings are accurately documented in the resident's note. I developed the management plan that is described in the resident's note, and I agree with the content, with my edits above.  Jules Schick, DO [facility], PGY-4  I was a preceptor for this visit and available for immediate consultation to both the sports medicine fellow and the resident. Marsa Aris, DO

## 2021-04-07 ENCOUNTER — Other Ambulatory Visit: Payer: Self-pay | Admitting: Emergency Medicine

## 2021-04-07 DIAGNOSIS — E1159 Type 2 diabetes mellitus with other circulatory complications: Secondary | ICD-10-CM

## 2021-04-26 ENCOUNTER — Ambulatory Visit: Payer: BC Managed Care – PPO | Admitting: Emergency Medicine

## 2021-04-26 ENCOUNTER — Other Ambulatory Visit: Payer: Self-pay

## 2021-04-26 ENCOUNTER — Encounter: Payer: Self-pay | Admitting: Emergency Medicine

## 2021-04-26 VITALS — BP 118/70 | HR 94 | Temp 98.3°F | Ht 67.0 in | Wt 262.0 lb

## 2021-04-26 DIAGNOSIS — M25511 Pain in right shoulder: Secondary | ICD-10-CM

## 2021-04-26 DIAGNOSIS — E1169 Type 2 diabetes mellitus with other specified complication: Secondary | ICD-10-CM | POA: Diagnosis not present

## 2021-04-26 DIAGNOSIS — E1159 Type 2 diabetes mellitus with other circulatory complications: Secondary | ICD-10-CM | POA: Diagnosis not present

## 2021-04-26 DIAGNOSIS — N529 Male erectile dysfunction, unspecified: Secondary | ICD-10-CM | POA: Insufficient documentation

## 2021-04-26 DIAGNOSIS — G4733 Obstructive sleep apnea (adult) (pediatric): Secondary | ICD-10-CM | POA: Diagnosis not present

## 2021-04-26 DIAGNOSIS — I152 Hypertension secondary to endocrine disorders: Secondary | ICD-10-CM

## 2021-04-26 DIAGNOSIS — M7918 Myalgia, other site: Secondary | ICD-10-CM

## 2021-04-26 DIAGNOSIS — G8929 Other chronic pain: Secondary | ICD-10-CM

## 2021-04-26 DIAGNOSIS — R5383 Other fatigue: Secondary | ICD-10-CM | POA: Diagnosis not present

## 2021-04-26 DIAGNOSIS — E785 Hyperlipidemia, unspecified: Secondary | ICD-10-CM

## 2021-04-26 LAB — CBC WITH DIFFERENTIAL/PLATELET
Basophils Absolute: 0 10*3/uL (ref 0.0–0.1)
Basophils Relative: 0.4 % (ref 0.0–3.0)
Eosinophils Absolute: 0.2 10*3/uL (ref 0.0–0.7)
Eosinophils Relative: 2.1 % (ref 0.0–5.0)
HCT: 41.3 % (ref 39.0–52.0)
Hemoglobin: 13.1 g/dL (ref 13.0–17.0)
Lymphocytes Relative: 34.5 % (ref 12.0–46.0)
Lymphs Abs: 2.9 10*3/uL (ref 0.7–4.0)
MCHC: 31.8 g/dL (ref 30.0–36.0)
MCV: 75.8 fl — ABNORMAL LOW (ref 78.0–100.0)
Monocytes Absolute: 1 10*3/uL (ref 0.1–1.0)
Monocytes Relative: 11.3 % (ref 3.0–12.0)
Neutro Abs: 4.4 10*3/uL (ref 1.4–7.7)
Neutrophils Relative %: 51.7 % (ref 43.0–77.0)
Platelets: 271 10*3/uL (ref 150.0–400.0)
RBC: 5.45 Mil/uL (ref 4.22–5.81)
RDW: 15.6 % — ABNORMAL HIGH (ref 11.5–15.5)
WBC: 8.5 10*3/uL (ref 4.0–10.5)

## 2021-04-26 LAB — COMPREHENSIVE METABOLIC PANEL
ALT: 33 U/L (ref 0–53)
AST: 22 U/L (ref 0–37)
Albumin: 4.6 g/dL (ref 3.5–5.2)
Alkaline Phosphatase: 91 U/L (ref 39–117)
BUN: 17 mg/dL (ref 6–23)
CO2: 30 mEq/L (ref 19–32)
Calcium: 10 mg/dL (ref 8.4–10.5)
Chloride: 101 mEq/L (ref 96–112)
Creatinine, Ser: 0.89 mg/dL (ref 0.40–1.50)
GFR: 99.38 mL/min (ref >60.00)
Glucose, Bld: 92 mg/dL (ref 70–99)
Potassium: 4.2 mEq/L (ref 3.5–5.1)
Sodium: 139 mEq/L (ref 135–145)
Total Bilirubin: 0.4 mg/dL (ref 0.2–1.2)
Total Protein: 7.6 g/dL (ref 6.0–8.3)

## 2021-04-26 LAB — HEMOGLOBIN A1C: Hgb A1c MFr Bld: 7.7 % — ABNORMAL HIGH (ref 4.6–6.5)

## 2021-04-26 MED ORDER — TADALAFIL 20 MG PO TABS: 10.0000 mg | ORAL_TABLET | ORAL | 11 refills | Status: DC | PRN

## 2021-04-26 MED ORDER — OZEMPIC (0.25 OR 0.5 MG/DOSE) 2 MG/1.5ML ~~LOC~~ SOPN: 0.5000 mg | PEN_INJECTOR | SUBCUTANEOUS | 5 refills | Status: DC

## 2021-04-26 NOTE — Assessment & Plan Note (Signed)
Multifactorial.  Differential diagnosis discussed.  We will do blood work today. Diet and nutrition discussed.  Medications reviewed.

## 2021-04-26 NOTE — Assessment & Plan Note (Signed)
Sildenafil not working very well.  We will try Cialis instead.

## 2021-04-26 NOTE — Patient Instructions (Signed)
Diabetes Mellitus and Nutrition, Adult When you have diabetes, or diabetes mellitus, it is very important to have healthy eating habits because your blood sugar (glucose) levels are greatly affected by what you eat and drink. Eating healthy foods in the right amounts, at about the same times every day, can help you:  Control your blood glucose.  Lower your risk of heart disease.  Improve your blood pressure.  Reach or maintain a healthy weight. What can affect my meal plan? Every person with diabetes is different, and each person has different needs for a meal plan. Your health care provider may recommend that you work with a dietitian to make a meal plan that is best for you. Your meal plan may vary depending on factors such as:  The calories you need.  The medicines you take.  Your weight.  Your blood glucose, blood pressure, and cholesterol levels.  Your activity level.  Other health conditions you have, such as heart or kidney disease. How do carbohydrates affect me? Carbohydrates, also called carbs, affect your blood glucose level more than any other type of food. Eating carbs naturally raises the amount of glucose in your blood. Carb counting is a method for keeping track of how many carbs you eat. Counting carbs is important to keep your blood glucose at a healthy level, especially if you use insulin or take certain oral diabetes medicines. It is important to know how many carbs you can safely have in each meal. This is different for every person. Your dietitian can help you calculate how many carbs you should have at each meal and for each snack. How does alcohol affect me? Alcohol can cause a sudden decrease in blood glucose (hypoglycemia), especially if you use insulin or take certain oral diabetes medicines. Hypoglycemia can be a life-threatening condition. Symptoms of hypoglycemia, such as sleepiness, dizziness, and confusion, are similar to symptoms of having too much  alcohol.  Do not drink alcohol if: ? Your health care provider tells you not to drink. ? You are pregnant, may be pregnant, or are planning to become pregnant.  If you drink alcohol: ? Do not drink on an empty stomach. ? Limit how much you use to:  0-1 drink a day for women.  0-2 drinks a day for men. ? Be aware of how much alcohol is in your drink. In the [place] one drink equals one 12 oz bottle of beer (355 mL), one 5 oz glass of wine (148 mL), or one 1 oz glass of hard liquor (44 mL). ? Keep yourself hydrated with water, diet soda, or unsweetened iced tea.  Keep in mind that regular soda, juice, and other mixers may contain a lot of sugar and must be counted as carbs. What are tips for following this plan? Reading food labels  Start by checking the serving size on the "Nutrition Facts" label of packaged foods and drinks. The amount of calories, carbs, fats, and other nutrients listed on the label is based on one serving of the item. Many items contain more than one serving per package.  Check the total grams (g) of carbs in one serving. You can calculate the number of servings of carbs in one serving by dividing the total carbs by 15. For example, if a food has 30 g of total carbs per serving, it would be equal to 2 servings of carbs.  Check the number of grams (g) of saturated fats and trans fats in one serving. Choose foods that have   a low amount or none of these fats.  Check the number of milligrams (mg) of salt (sodium) in one serving. Most people should limit total sodium intake to less than 2,300 mg per day.  Always check the nutrition information of foods labeled as "low-fat" or "nonfat." These foods may be higher in added sugar or refined carbs and should be avoided.  Talk to your dietitian to identify your daily goals for nutrients listed on the label. Shopping  Avoid buying canned, pre-made, or processed foods. These foods tend to be high in fat, sodium, and added  sugar.  Shop around the outside edge of the grocery store. This is where you will most often find fresh fruits and vegetables, bulk grains, fresh meats, and fresh dairy. Cooking  Use low-heat cooking methods, such as baking, instead of high-heat cooking methods like deep frying.  Cook using healthy oils, such as olive, canola, or sunflower oil.  Avoid cooking with butter, cream, or high-fat meats. Meal planning  Eat meals and snacks regularly, preferably at the same times every day. Avoid going long periods of time without eating.  Eat foods that are high in fiber, such as fresh fruits, vegetables, beans, and whole grains. Talk with your dietitian about how many servings of carbs you can eat at each meal.  Eat 4-6 oz (112-168 g) of lean protein each day, such as lean meat, chicken, fish, eggs, or tofu. One ounce (oz) of lean protein is equal to: ? 1 oz (28 g) of meat, chicken, or fish. ? 1 egg. ?  cup (62 g) of tofu.  Eat some foods each day that contain healthy fats, such as avocado, nuts, seeds, and fish.   What foods should I eat? Fruits Berries. Apples. Oranges. Peaches. Apricots. Plums. Grapes. Mango. Papaya. Pomegranate. Kiwi. Cherries. Vegetables Lettuce. Spinach. Leafy greens, including kale, chard, collard greens, and mustard greens. Beets. Cauliflower. Cabbage. Broccoli. Carrots. Green beans. Tomatoes. Peppers. Onions. Cucumbers. Brussels sprouts. Grains Whole grains, such as whole-wheat or whole-grain bread, crackers, tortillas, cereal, and pasta. Unsweetened oatmeal. Quinoa. Brown or wild rice. Meats and other proteins Seafood. Poultry without skin. Lean cuts of poultry and beef. Tofu. Nuts. Seeds. Dairy Low-fat or fat-free dairy products such as milk, yogurt, and cheese. The items listed above may not be a complete list of foods and beverages you can eat. Contact a dietitian for more information. What foods should I avoid? Fruits Fruits canned with  syrup. Vegetables Canned vegetables. Frozen vegetables with butter or cream sauce. Grains Refined white flour and flour products such as bread, pasta, snack foods, and cereals. Avoid all processed foods. Meats and other proteins Fatty cuts of meat. Poultry with skin. Breaded or fried meats. Processed meat. Avoid saturated fats. Dairy Full-fat yogurt, cheese, or milk. Beverages Sweetened drinks, such as soda or iced tea. The items listed above may not be a complete list of foods and beverages you should avoid. Contact a dietitian for more information. Questions to ask a health care provider  Do I need to meet with a diabetes educator?  Do I need to meet with a dietitian?  What number can I call if I have questions?  When are the best times to check my blood glucose? Where to find more information:  American Diabetes Association: diabetes.org  Academy of Nutrition and Dietetics: www.eatright.org  National Institute of Diabetes and Digestive and Kidney Diseases: www.niddk.nih.gov  Association of Diabetes Care and Education Specialists: www.diabeteseducator.org Summary  It is important to have healthy eating   habits because your blood sugar (glucose) levels are greatly affected by what you eat and drink.  A healthy meal plan will help you control your blood glucose and maintain a healthy lifestyle.  Your health care provider may recommend that you work with a dietitian to make a meal plan that is best for you.  Keep in mind that carbohydrates (carbs) and alcohol have immediate effects on your blood glucose levels. It is important to count carbs and to use alcohol carefully. This information is not intended to replace advice given to you by your health care provider. Make sure you discuss any questions you have with your health care provider. Document Revised: 11/09/2019 Document Reviewed: 11/09/2019 Elsevier Patient Education  2021 [employer]  

## 2021-04-26 NOTE — Progress Notes (Addendum)
Walter MalesIsaiah Howson Jr. 50 y.o.   Chief Complaint  Patient presents with  . Hypertension    BP and diabetic check. Pt would like to discuss pain in his shoulder.    HISTORY OF PRESENT ILLNESS: This is a 50 y.o. male with history of diabetes and hypertension here for follow-up. Also having chronic right shoulder pain.  Denies injury. Complaining of lack of energy and lack of staying power with his erection. Not exercising and not eating well. No other complaints or medical concerns today. Lab Results  Component Value Date   HGBA1C 6.6 (A) 10/30/2020   BP Readings from Last 3 Encounters:  04/26/21 118/70  04/06/21 130/82  03/27/21 136/81     HPI   Prior to Admission medications   Medication Sig Start Date End Date Taking? Authorizing Provider  aspirin EC 81 MG EC tablet Take 1 tablet (81 mg total) by mouth daily. 04/02/20  Yes Walter Reynolds  atorvastatin (LIPITOR) 20 MG tablet TAKE 2 TABLETS BY MOUTH EVERY DAY 02/06/21  Yes Walter Reynolds  fluticasone [facility](FLONASE) 50 MCG/ACT nasal spray SPRAY 2 SPRAYS INTO EACH NOSTRIL EVERY DAY 01/11/21  Yes Walter Reynolds  lisinopril (ZESTRIL) 20 MG tablet TAKE 1 TABLET BY MOUTH EVERY DAY 11/11/20  Yes Walter Reynolds  metFORMIN (GLUCOPHAGE) 1000 MG tablet TAKE 1 TABLET (1,000 MG TOTAL) BY MOUTH 2 (TWO) TIMES DAILY WITH A MEAL. 04/07/21  Yes Walter Reynolds  metoprolol succinate (TOPROL-XL) 25 MG 24 hr tablet TAKE 1 TABLET BY MOUTH EVERY DAY 03/26/21  Yes Walter Reynolds  sildenafil (VIAGRA) 100 MG tablet Take 0.5-1 tablets (50-100 mg total) by mouth daily as needed for erectile dysfunction. 10/30/20  Yes Walter Reynolds  ibuprofen (ADVIL) 800 MG tablet Take 1 tablet (800 mg total) by mouth 3 (three) times daily. Patient not taking: Reported on 04/26/2021 03/27/21   Walter Reynolds    Allergies  Allergen Reactions  . Glipizide Shortness Of Breath    Shakey   . Doxycycline Hives  .  Erythromycin Base Hives    Patient Active Problem List   Diagnosis Date Noted  . Body mass index (BMI) of 40.1-44.9 in adult (HCC) 07/17/2020  . Hypertension associated with diabetes (HCC) 09/27/2019  . Morbid obesity (HCC) 09/27/2019  . Dyslipidemia 09/09/2018  . Environmental allergies 09/09/2018  . BMI 39.0-39.9,adult 09/03/2017  . Dyslipidemia associated with type 2 diabetes mellitus (HCC) 09/03/2017  . Anemia 04/14/2017  . Hyperlipidemia 05/02/2016  . OSA (obstructive sleep apnea) 09/06/2015  . Benign essential HTN 09/06/2015    Past Medical History:  Diagnosis Date  . Allergy   . Diabetes mellitus without complication (HCC)   . Elevated cholesterol   . Hernia    bi lateral hernia repair  . History of kidney stones   . Hypertension   . Sleep apnea 2014   severe  . Umbilical hernia without obstruction and without gangrene 09/03/2017    Past Surgical History:  Procedure Laterality Date  . CARPAL TUNNEL RELEASE Right 2007  . CARPAL TUNNEL RELEASE Left 10/28/2014   Procedure: LEFT CARPAL TUNNEL RELEASE;  Surgeon: Walter Reynolds;  Location: [facility];  Service: Orthopedics;  Laterality: Left;  . FOOT SURGERY Left   . INGUINAL HERNIA REPAIR     times two, each side  . KIDNEY STONE SURGERY    . MASS EXCISION Left 10/28/2014   Procedure: LEFT WRIST VOLAR MASS EXCISION;  Surgeon:  Walter Ponder, Reynolds;  Location: [facility];  Service: Orthopedics;  Laterality: Left;  . UMBILICAL HERNIA REPAIR N/A 10/27/2017   Procedure: HERNIA REPAIR UMBILICAL ADULT;  Surgeon: Walter Reynolds;  Location: [facility];  Service: General;  Laterality: N/A;    Social History   Socioeconomic History  . Marital status: Married    Spouse name: Walter Reynolds  . Number of children: 2  . Years of education: Associates  . Highest education level: Not on file  Occupational History  . Occupation: Merchandiser, retail    Comment: [employer]  Tobacco Use  . Smoking  status: Never Smoker  . Smokeless tobacco: Never Used  Vaping Use  . Vaping Use: Never used  Substance and Sexual Activity  . Alcohol use: No    Alcohol/week: 0.0 standard drinks  . Drug use: No  . Sexual activity: Yes    Partners: Female    Birth control/protection: Condom  Other Topics Concern  . Not on file  Social History Narrative   Lives with his wife and their 2 children.   He has 3 sons from a previous relationship that live independently.   Formerly in the [employer].   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   Social Determinants of Health   Financial Resource Strain: Not on file  Food Insecurity: Not on file  Transportation Needs: Not on file  Physical Activity: Not on file  Stress: Not on file  Social Connections: Not on file  Intimate Partner Violence: Not on file    Family History  Problem Relation Age of Onset  . Diabetes Mother   . Stroke Mother        4 strokes  . Heart attack Mother   . Heart disease Mother   . Hypertension Father   . Hypertension Sister   . Heart disease Sister        pacemaker     Review of Systems  Constitutional: Positive for malaise/fatigue. Negative for chills and fever.  HENT: Negative.  Negative for congestion and sore throat.   Respiratory: Negative.  Negative for cough and shortness of breath.   Cardiovascular: Negative.  Negative for chest pain and palpitations.  Gastrointestinal: Negative for abdominal pain, nausea and vomiting.  Genitourinary: Negative.  Negative for dysuria and hematuria.  Musculoskeletal: Positive for joint pain.  Skin: Negative.  Negative for rash.  Neurological: Positive for weakness. Negative for dizziness and headaches.  All other systems reviewed and are negative.   Today's Vitals   04/26/21 1319  BP: 118/70  Pulse: 94  Temp: 98.3 F (36.8 C)  TempSrc: Oral  SpO2: 98%  Weight: 262 lb (118.8 kg)   Height: 5\' 7"  (1.702 m)   Body mass index is 41.04 kg/m. Wt Readings from Last 3 Encounters:  04/26/21 262 lb (118.8 kg)  04/06/21 260 lb (117.9 kg)  02/20/21 261 lb (118.4 kg)    Physical Exam Vitals reviewed.  Constitutional:      Appearance: He is obese.  HENT:     Head: Normocephalic.  Eyes:     Extraocular Movements: Extraocular movements intact.     Conjunctiva/sclera: Conjunctivae normal.     Pupils: Pupils are equal, round, and reactive to light.  Cardiovascular:     Rate and Rhythm: Normal rate and regular rhythm.     Pulses: Normal pulses.     Heart sounds: Normal heart sounds.  Pulmonary:  Effort: Pulmonary effort is normal.     Breath sounds: Normal breath sounds.  Musculoskeletal:        General: Normal range of motion.     Cervical back: Normal range of motion and neck supple.     Comments: Right shoulder: Full range of motion.  No swelling or significant tenderness.  Complains of some pain during the range of motion, not constant, intermittent.  Skin:    General: Skin is warm and dry.     Capillary Refill: Capillary refill takes less than 2 seconds.  Neurological:     General: No focal deficit present.     Mental Status: He is alert and oriented to person, place, and time.  Psychiatric:        Mood and Affect: Mood normal.        Behavior: Behavior normal.      ASSESSMENT & PLAN: A total of 30 minutes was spent with the patient and counseling/coordination of care regarding diabetes and hypertension and cardiovascular risks associated with these conditions, review of all medications and changes made including addition of Ozempic, side effects, education on nutrition, review of most recent office visit notes, review of most recent blood work results, need to exercise more, chronic musculoskeletal pain and treatment, differential diagnosis of lack of energy, erectile dysfunction and lack of staying power, Cialis, prognosis, documentation and need for  follow-up.   Hypertension associated with diabetes (HCC) Well-controlled hypertension.  Continue lisinopril 20 mg and metoprolol succinate 25 mg daily. Hemoglobin A1c blood work done today. Patient not exercising and not eating well.  No weight reduction noted. Complaining of lack of energy.  Probably symptoms of low/slow metabolism syndrome. Continue metformin 1000 mg twice a day. Will start Ozempic 0.5 mg weekly. Diet and nutrition discussed. Continue atorvastatin 20 mg daily. Follow-up in 3 months.  Lack of energy Multifactorial.  Differential diagnosis discussed.  We will do blood work today. Diet and nutrition discussed.  Medications reviewed.  Morbid obesity (HCC) Diet and nutrition discussed.  Continue metformin and start Ozempic.  Erectile dysfunction Sildenafil not working very well.  We will try Cialis instead.  Chronic right shoulder pain Intermittent pain.  Musculoskeletal in origin.  Advised to take Tylenol as needed.  Will consult orthopedic if no better in 2 to 3 months.  Walter Reynolds was seen today for hypertension.  Diagnoses and all orders for this visit:  Hypertension associated with diabetes (HCC) -     Comprehensive metabolic panel -     Hemoglobin A1c -     Semaglutide,0.25 or 0.5MG /DOS, (OZEMPIC, 0.25 OR 0.5 MG/DOSE,) 2 MG/1.5ML SOPN; Inject 0.5 mg into the skin once a week.  Dyslipidemia  Dyslipidemia associated with type 2 diabetes mellitus (HCC)  OSA (obstructive sleep apnea)  Morbid obesity (HCC)  Lack of energy -     TestT+TestF+SHBG -     CBC with Differential/Platelet  Erectile dysfunction, unspecified erectile dysfunction type -     tadalafil (CIALIS) 20 MG tablet; Take 0.5-1 tablets (10-20 mg total) by mouth every other day as needed for erectile dysfunction.  Chronic right shoulder pain  Chronic musculoskeletal pain     Patient Instructions   Diabetes Mellitus and Nutrition, Adult When you have diabetes, or diabetes mellitus, it  is very important to have healthy eating habits because your blood sugar (glucose) levels are greatly affected by what you eat and drink. Eating healthy foods in the right amounts, at about the same times every day, can help you:  Control your blood glucose.  Lower your risk of heart disease.  Improve your blood pressure.  Reach or maintain a healthy weight. What can affect my meal plan? Every person with diabetes is different, and each person has different needs for a meal plan. Your health care provider may recommend that you work with a dietitian to make a meal plan that is best for you. Your meal plan may vary depending on factors such as:  The calories you need.  The medicines you take.  Your weight.  Your blood glucose, blood pressure, and cholesterol levels.  Your activity level.  Other health conditions you have, such as heart or kidney disease. How do carbohydrates affect me? Carbohydrates, also called carbs, affect your blood glucose level more than any other type of food. Eating carbs naturally raises the amount of glucose in your blood. Carb counting is a method for keeping track of how many carbs you eat. Counting carbs is important to keep your blood glucose at a healthy level, especially if you use insulin or take certain oral diabetes medicines. It is important to know how many carbs you can safely have in each meal. This is different for every person. Your dietitian can help you calculate how many carbs you should have at each meal and for each snack. How does alcohol affect me? Alcohol can cause a sudden decrease in blood glucose (hypoglycemia), especially if you use insulin or take certain oral diabetes medicines. Hypoglycemia can be a life-threatening condition. Symptoms of hypoglycemia, such as sleepiness, dizziness, and confusion, are similar to symptoms of having too much alcohol.  Do not drink alcohol if: ? Your health care provider tells you not to drink. ? You  are pregnant, may be pregnant, or are planning to become pregnant.  If you drink alcohol: ? Do not drink on an empty stomach. ? Limit how much you use to:  0-1 drink a day for women.  0-2 drinks a day for men. ? Be aware of how much alcohol is in your drink. In the [place] one drink equals one 12 oz bottle of beer (355 mL), one 5 oz glass of wine (148 mL), or one 1 oz glass of hard liquor (44 mL). ? Keep yourself hydrated with water, diet soda, or unsweetened iced tea.  Keep in mind that regular soda, juice, and other mixers may contain a lot of sugar and must be counted as carbs. What are tips for following this plan? Reading food labels  Start by checking the serving size on the "Nutrition Facts" label of packaged foods and drinks. The amount of calories, carbs, fats, and other nutrients listed on the label is based on one serving of the item. Many items contain more than one serving per package.  Check the total grams (g) of carbs in one serving. You can calculate the number of servings of carbs in one serving by dividing the total carbs by 15. For example, if a food has 30 g of total carbs per serving, it would be equal to 2 servings of carbs.  Check the number of grams (g) of saturated fats and trans fats in one serving. Choose foods that have a low amount or none of these fats.  Check the number of milligrams (mg) of salt (sodium) in one serving. Most people should limit total sodium intake to less than 2,300 mg per day.  Always check the nutrition information of foods labeled as "low-fat" or "nonfat." These foods may be higher in  added sugar or refined carbs and should be avoided.  Talk to your dietitian to identify your daily goals for nutrients listed on the label. Shopping  Avoid buying canned, pre-made, or processed foods. These foods tend to be high in fat, sodium, and added sugar.  Shop around the outside edge of the grocery store. This is where you will most often find  fresh fruits and vegetables, bulk grains, fresh meats, and fresh dairy. Cooking  Use low-heat cooking methods, such as baking, instead of high-heat cooking methods like deep frying.  Cook using healthy oils, such as olive, canola, or sunflower oil.  Avoid cooking with butter, cream, or high-fat meats. Meal planning  Eat meals and snacks regularly, preferably at the same times every day. Avoid going long periods of time without eating.  Eat foods that are high in fiber, such as fresh fruits, vegetables, beans, and whole grains. Talk with your dietitian about how many servings of carbs you can eat at each meal.  Eat 4-6 oz (112-168 g) of lean protein each day, such as lean meat, chicken, fish, eggs, or tofu. One ounce (oz) of lean protein is equal to: ? 1 oz (28 g) of meat, chicken, or fish. ? 1 egg. ?  cup (62 g) of tofu.  Eat some foods each day that contain healthy fats, such as avocado, nuts, seeds, and fish.   What foods should I eat? Fruits Berries. Apples. Oranges. Peaches. Apricots. Plums. Grapes. Mango. Papaya. Pomegranate. Kiwi. Cherries. Vegetables Lettuce. Spinach. Leafy greens, including kale, chard, collard greens, and mustard greens. Beets. Cauliflower. Cabbage. Broccoli. Carrots. Green beans. Tomatoes. Peppers. Onions. Cucumbers. Brussels sprouts. Grains Whole grains, such as whole-wheat or whole-grain bread, crackers, tortillas, cereal, and pasta. Unsweetened oatmeal. Quinoa. Brown or wild rice. Meats and other proteins Seafood. Poultry without skin. Lean cuts of poultry and beef. Tofu. Nuts. Seeds. Dairy Low-fat or fat-free dairy products such as milk, yogurt, and cheese. The items listed above may not be a complete list of foods and beverages you can eat. Contact a dietitian for more information. What foods should I avoid? Fruits Fruits canned with syrup. Vegetables Canned vegetables. Frozen vegetables with butter or cream sauce. Grains Refined white flour and  flour products such as bread, pasta, snack foods, and cereals. Avoid all processed foods. Meats and other proteins Fatty cuts of meat. Poultry with skin. Breaded or fried meats. Processed meat. Avoid saturated fats. Dairy Full-fat yogurt, cheese, or milk. Beverages Sweetened drinks, such as soda or iced tea. The items listed above may not be a complete list of foods and beverages you should avoid. Contact a dietitian for more information. Questions to ask a health care provider  Do I need to meet with a diabetes educator?  Do I need to meet with a dietitian?  What number can I call if I have questions?  When are the best times to check my blood glucose? Where to find more information:  American Diabetes Association: diabetes.org  Academy of Nutrition and Dietetics: www.eatright.[employer] of Diabetes and Digestive and Kidney Diseases: CarFlippers.tn  Association of Diabetes Care and Education Specialists: www.diabeteseducator.org Summary  It is important to have healthy eating habits because your blood sugar (glucose) levels are greatly affected by what you eat and drink.  A healthy meal plan will help you control your blood glucose and maintain a healthy lifestyle.  Your health care provider may recommend that you work with a dietitian to make a meal plan that is best  for you.  Keep in mind that carbohydrates (carbs) and alcohol have immediate effects on your blood glucose levels. It is important to count carbs and to use alcohol carefully. This information is not intended to replace advice given to you by your health care provider. Make sure you discuss any questions you have with your health care provider. Document Revised: 11/09/2019 Document Reviewed: 11/09/2019 Elsevier Patient Education  2021 Elsevier Inc.     Edwina Barth, [facility]

## 2021-04-26 NOTE — Assessment & Plan Note (Signed)
Diet and nutrition discussed.  Continue metformin and start Ozempic.

## 2021-04-26 NOTE — Assessment & Plan Note (Signed)
Well-controlled hypertension.  Continue lisinopril 20 mg and metoprolol succinate 25 mg daily. Hemoglobin A1c blood work done today. Patient not exercising and not eating well.  No weight reduction noted. Complaining of lack of energy.  Probably symptoms of low/slow metabolism syndrome. Continue metformin 1000 mg twice a day. Will start Ozempic 0.5 mg weekly. Diet and nutrition discussed. Continue atorvastatin 20 mg daily. Follow-up in 3 months.

## 2021-04-27 DIAGNOSIS — G8929 Other chronic pain: Secondary | ICD-10-CM | POA: Insufficient documentation

## 2021-04-27 NOTE — Assessment & Plan Note (Signed)
Intermittent pain.  Musculoskeletal in origin.  Advised to take Tylenol as needed.  Will consult orthopedic if no better in 2 to 3 months.

## 2021-04-30 DIAGNOSIS — G4733 Obstructive sleep apnea (adult) (pediatric): Secondary | ICD-10-CM | POA: Diagnosis not present

## 2021-05-02 ENCOUNTER — Ambulatory Visit: Payer: Self-pay | Admitting: Emergency Medicine

## 2021-05-04 LAB — TESTT+TESTF+SHBG
Sex Hormone Binding: 29.4 nmol/L (ref 19.3–76.4)
Testosterone, Free: 3 pg/mL — ABNORMAL LOW (ref 7.2–24.0)
Testosterone, Total, LC/MS: 269.8 ng/dL (ref 264.0–916.0)

## 2021-05-22 ENCOUNTER — Other Ambulatory Visit: Payer: Self-pay | Admitting: Emergency Medicine

## 2021-05-22 DIAGNOSIS — I152 Hypertension secondary to endocrine disorders: Secondary | ICD-10-CM

## 2021-05-22 DIAGNOSIS — E1159 Type 2 diabetes mellitus with other circulatory complications: Secondary | ICD-10-CM

## 2021-05-31 DIAGNOSIS — G4733 Obstructive sleep apnea (adult) (pediatric): Secondary | ICD-10-CM | POA: Diagnosis not present

## 2021-06-02 ENCOUNTER — Other Ambulatory Visit: Payer: Self-pay | Admitting: Emergency Medicine

## 2021-06-30 DIAGNOSIS — G4733 Obstructive sleep apnea (adult) (pediatric): Secondary | ICD-10-CM | POA: Diagnosis not present

## 2021-07-17 ENCOUNTER — Other Ambulatory Visit: Payer: Self-pay

## 2021-07-17 ENCOUNTER — Other Ambulatory Visit: Payer: Self-pay | Admitting: Emergency Medicine

## 2021-07-17 ENCOUNTER — Emergency Department (HOSPITAL_COMMUNITY): Payer: BC Managed Care – PPO

## 2021-07-17 ENCOUNTER — Emergency Department (HOSPITAL_COMMUNITY)
Admission: EM | Admit: 2021-07-17 | Discharge: 2021-07-17 | Disposition: A | Discharge status: Home / Self Care | Payer: BC Managed Care – PPO | Attending: Emergency Medicine | Admitting: Emergency Medicine

## 2021-07-17 ENCOUNTER — Encounter (HOSPITAL_COMMUNITY): Payer: Self-pay | Admitting: Emergency Medicine

## 2021-07-17 DIAGNOSIS — Z20822 Contact with and (suspected) exposure to covid-19: Secondary | ICD-10-CM | POA: Diagnosis not present

## 2021-07-17 DIAGNOSIS — R1110 Vomiting, unspecified: Secondary | ICD-10-CM | POA: Diagnosis not present

## 2021-07-17 DIAGNOSIS — E119 Type 2 diabetes mellitus without complications: Secondary | ICD-10-CM | POA: Diagnosis not present

## 2021-07-17 DIAGNOSIS — Z7984 Long term (current) use of oral hypoglycemic drugs: Secondary | ICD-10-CM | POA: Insufficient documentation

## 2021-07-17 DIAGNOSIS — R519 Headache, unspecified: Secondary | ICD-10-CM | POA: Diagnosis not present

## 2021-07-17 DIAGNOSIS — R112 Nausea with vomiting, unspecified: Secondary | ICD-10-CM | POA: Diagnosis not present

## 2021-07-17 DIAGNOSIS — Z79899 Other long term (current) drug therapy: Secondary | ICD-10-CM | POA: Insufficient documentation

## 2021-07-17 DIAGNOSIS — Z794 Long term (current) use of insulin: Secondary | ICD-10-CM | POA: Diagnosis not present

## 2021-07-17 DIAGNOSIS — I10 Essential (primary) hypertension: Secondary | ICD-10-CM | POA: Insufficient documentation

## 2021-07-17 DIAGNOSIS — R197 Diarrhea, unspecified: Secondary | ICD-10-CM | POA: Insufficient documentation

## 2021-07-17 DIAGNOSIS — Z7982 Long term (current) use of aspirin: Secondary | ICD-10-CM | POA: Diagnosis not present

## 2021-07-17 DIAGNOSIS — E1159 Type 2 diabetes mellitus with other circulatory complications: Secondary | ICD-10-CM

## 2021-07-17 LAB — CBC WITH DIFFERENTIAL/PLATELET
Abs Immature Granulocytes: 0.05 10*3/uL (ref 0.00–0.07)
Basophils Absolute: 0.1 10*3/uL (ref 0.0–0.1)
Basophils Relative: 1 %
Eosinophils Absolute: 0.2 10*3/uL (ref 0.0–0.5)
Eosinophils Relative: 1 %
HCT: 42 % (ref 39.0–52.0)
Hemoglobin: 12.8 g/dL — ABNORMAL LOW (ref 13.0–17.0)
Immature Granulocytes: 1 %
Lymphocytes Relative: 33 %
Lymphs Abs: 3.5 10*3/uL (ref 0.7–4.0)
MCH: 23.6 pg — ABNORMAL LOW (ref 26.0–34.0)
MCHC: 30.5 g/dL (ref 30.0–36.0)
MCV: 77.3 fL — ABNORMAL LOW (ref 80.0–100.0)
Monocytes Absolute: 1 10*3/uL (ref 0.1–1.0)
Monocytes Relative: 10 %
Neutro Abs: 5.8 10*3/uL (ref 1.7–7.7)
Neutrophils Relative %: 54 %
Platelets: 259 10*3/uL (ref 150–400)
RBC: 5.43 MIL/uL (ref 4.22–5.81)
RDW: 15.8 % — ABNORMAL HIGH (ref 11.5–15.5)
WBC: 10.5 10*3/uL (ref 4.0–10.5)
nRBC: 0 % (ref 0.0–0.2)

## 2021-07-17 LAB — COMPREHENSIVE METABOLIC PANEL
ALT: 34 U/L (ref 0–44)
AST: 20 U/L (ref 15–41)
Albumin: 3.9 g/dL (ref 3.5–5.0)
Alkaline Phosphatase: 82 U/L (ref 38–126)
Anion gap: 11 (ref 5–15)
BUN: 11 mg/dL (ref 6–20)
CO2: 24 mmol/L (ref 22–32)
Calcium: 9.5 mg/dL (ref 8.9–10.3)
Chloride: 102 mmol/L (ref 98–111)
Creatinine, Ser: 0.88 mg/dL (ref 0.61–1.24)
GFR, Estimated: >60 mL/min (ref >60)
Glucose, Bld: 152 mg/dL — ABNORMAL HIGH (ref 70–99)
Potassium: 4 mmol/L (ref 3.5–5.1)
Sodium: 137 mmol/L (ref 135–145)
Total Bilirubin: 0.8 mg/dL (ref 0.3–1.2)
Total Protein: 6.9 g/dL (ref 6.5–8.1)

## 2021-07-17 LAB — TROPONIN I (HIGH SENSITIVITY)
Troponin I (High Sensitivity): 7 ng/L (ref <18)
Troponin I (High Sensitivity): 5 ng/L (ref <18)

## 2021-07-17 LAB — URINALYSIS, ROUTINE W REFLEX MICROSCOPIC
Bilirubin Urine: NEGATIVE
Glucose, UA: NEGATIVE mg/dL
Hgb urine dipstick: NEGATIVE
Ketones, ur: NEGATIVE mg/dL
Leukocytes,Ua: NEGATIVE
Nitrite: NEGATIVE
Protein, ur: NEGATIVE mg/dL
Specific Gravity, Urine: 1.014 (ref 1.005–1.030)
pH: 7 (ref 5.0–8.0)

## 2021-07-17 LAB — RESP PANEL BY RT-PCR (FLU A&B, COVID) ARPGX2
Influenza A by PCR: NEGATIVE
Influenza B by PCR: NEGATIVE
SARS Coronavirus 2 by RT PCR: NEGATIVE

## 2021-07-17 LAB — CBG MONITORING, ED: Glucose-Capillary: 169 mg/dL — ABNORMAL HIGH (ref 70–99)

## 2021-07-17 LAB — LIPASE, BLOOD: Lipase: 33 U/L (ref 11–51)

## 2021-07-17 MED ORDER — KETOROLAC TROMETHAMINE 30 MG/ML IJ SOLN
30.0000 mg | Freq: Once | INTRAMUSCULAR | Status: AC
  Administered 2021-07-17: 30 mg via INTRAVENOUS
  Filled 2021-07-17: qty 1

## 2021-07-17 MED ORDER — METOCLOPRAMIDE HCL 5 MG/ML IJ SOLN
10.0000 mg | Freq: Once | INTRAMUSCULAR | Status: AC
  Administered 2021-07-17: 10 mg via INTRAVENOUS
  Filled 2021-07-17: qty 2

## 2021-07-17 MED ORDER — SODIUM CHLORIDE 0.9 % IV BOLUS
1000.0000 mL | Freq: Once | INTRAVENOUS | Status: AC
  Administered 2021-07-17: 1000 mL via INTRAVENOUS

## 2021-07-17 MED ORDER — IBUPROFEN 600 MG PO TABS: 600.0000 mg | ORAL_TABLET | Freq: Four times a day (QID) | ORAL | 0 refills | Status: DC | PRN

## 2021-07-17 MED ORDER — ONDANSETRON 4 MG PO TBDP
4.0000 mg | ORAL_TABLET | Freq: Once | ORAL | Status: AC
  Administered 2021-07-17: 4 mg via ORAL
  Filled 2021-07-17: qty 1

## 2021-07-17 MED ORDER — ONDANSETRON HCL 4 MG/2ML IJ SOLN
4.0000 mg | Freq: Once | INTRAMUSCULAR | Status: AC
  Administered 2021-07-17: 4 mg via INTRAVENOUS
  Filled 2021-07-17: qty 2

## 2021-07-17 MED ORDER — DIPHENHYDRAMINE HCL 50 MG/ML IJ SOLN
50.0000 mg | Freq: Once | INTRAMUSCULAR | Status: AC
  Administered 2021-07-17: 50 mg via INTRAVENOUS
  Filled 2021-07-17: qty 1

## 2021-07-17 MED ORDER — ONDANSETRON HCL 4 MG PO TABS: 4.0000 mg | ORAL_TABLET | Freq: Three times a day (TID) | ORAL | 0 refills | Status: DC | PRN

## 2021-07-17 NOTE — ED Triage Notes (Signed)
Patient with generalized achyness, diarrhea and nausea.  Patient denies any fevers at this time.  Patient states that this has been going on for the last couple of days.  Patient did take tylenol, which did not help.  Mucous membranes dry.

## 2021-07-17 NOTE — ED Provider Notes (Signed)
Emergency Medicine Provider Triage Evaluation Note  Walter Reynolds. , a 50 y.o. male  was evaluated in triage.  Pt complains of sore, chills, myalgia, headache, nausea, vomiting, diarrhea onset approximately 3 days ago.  Patient reports his headache is generalized and throbbing.  Does report some blurred vision.  Denies previous history of headache.  Patient also with body aches.  Reports nonbloody diarrhea and persistent dry heaves.  Reports he feels significantly dehydrated.  Review of Systems  Positive: Nausea, vomiting, diarrhea, headache Negative: Syncope, dysuria  Physical Exam  BP (!) 160/98 (BP Location: Left Arm)   Pulse 92   Temp 98.1 F (36.7 C)   Resp 20   SpO2 97%  Gen:   Awake, no distress, dry heaving Resp:  Normal effort  MSK:   Moves extremities without difficulty  Other:  Dry mucous membranes  Medical Decision Making  Medically screening exam initiated at 5:47 AM.  Appropriate orders placed.  Walter Reynolds. was informed that the remainder of the evaluation will be completed by another provider, this initial triage assessment does not replace that evaluation, and the importance of remaining in the ED until their evaluation is complete.  Patient with headache and vomiting.  He is hypertensive.  Reports he feels unwell.  Possibly COVID but will obtain blood work and CT scan to rule out hypertensive urgency.   Milta Deiters 07/17/21 0551    Gilda Crease, MD 07/17/21 [phone]

## 2021-07-17 NOTE — Discharge Instructions (Addendum)
Please stay hydrated, take Zofran as needed for nausea, take Tylenol as needed for headache.  Follow-up with your doctor for further care.

## 2021-07-17 NOTE — ED Provider Notes (Signed)
[facility] EMERGENCY DEPARTMENT Provider Note   CSN: [id number] Arrival date & time: 07/17/21  0522     History Chief Complaint  Patient presents with   Headache    Walter Reynolds. is a 50 y.o. male.  The history is provided by the patient and medical records. No language interpreter was used.  Headache  50 year old male significant history of diabetes, hypertension, hypercholesterolemia presenting with flulike symptoms.  Patient report for the past 3 days he has had a throbbing frontal headache, nauseous, occasional vomiting, and have copious amount of loose stools.  He endorses mild nasal congestion, endorse body aches.  Symptoms moderate in severity.  No complaints of chest pain shortness of breath abdominal pain dysuria or rash.  Denies any recent sick contact or any recent medication changes.  He took a home COVID test which came back negative.  He has been fully vaccinated for COVID-19.  He denies any recent antibiotic use.  Patient also denies having fever     Past Medical History:  Diagnosis Date   Allergy    Diabetes mellitus without complication (HCC)    Elevated cholesterol    Hernia    bi lateral hernia repair   History of kidney stones    Hypertension    Sleep apnea 2014   severe   Umbilical hernia without obstruction and without gangrene 09/03/2017    Patient Active Problem List   Diagnosis Date Noted   Chronic right shoulder pain 04/27/2021   Erectile dysfunction 04/26/2021   Lack of energy 04/26/2021   Body mass index (BMI) of 40.1-44.9 in adult [facility]) 07/17/2020   Hypertension associated with diabetes (HCC) 09/27/2019   Morbid obesity (HCC) 09/27/2019   Dyslipidemia 09/09/2018   Environmental allergies 09/09/2018   BMI 39.0-39.9,adult 09/03/2017   Dyslipidemia associated with type 2 diabetes mellitus (HCC) 09/03/2017   Anemia 04/14/2017   Hyperlipidemia 05/02/2016   OSA (obstructive sleep apnea) 09/06/2015   Benign essential HTN  09/06/2015    Past Surgical History:  Procedure Laterality Date   CARPAL TUNNEL RELEASE Right 2007   CARPAL TUNNEL RELEASE Left 10/28/2014   Procedure: LEFT CARPAL TUNNEL RELEASE;  Surgeon: Dairl Ponder, MD;  Location: [facility];  Service: Orthopedics;  Laterality: Left;   FOOT SURGERY Left    INGUINAL HERNIA REPAIR     times two, each side   KIDNEY STONE SURGERY     MASS EXCISION Left 10/28/2014   Procedure: LEFT WRIST VOLAR MASS EXCISION;  Surgeon: Dairl Ponder, MD;  Location:  SURGERY CENTER;  Service: Orthopedics;  Laterality: Left;   UMBILICAL HERNIA REPAIR N/A 10/27/2017   Procedure: HERNIA REPAIR UMBILICAL ADULT;  Surgeon: Berna Bue, MD;  Location: [facility] OR;  Service: General;  Laterality: N/A;       Family History  Problem Relation Age of Onset   Diabetes Mother    Stroke Mother        4 strokes   Heart attack Mother    Heart disease Mother    Hypertension Father    Hypertension Sister    Heart disease Sister        pacemaker    Social History   Tobacco Use   Smoking status: Never   Smokeless tobacco: Never  Vaping Use   Vaping Use: Never used  Substance Use Topics   Alcohol use: No    Alcohol/week: 0.0 standard drinks   Drug use: No    Home Medications Prior to Admission medications  Medication Sig Start Date End Date Taking? Authorizing Provider  aspirin EC 81 MG EC tablet Take 1 tablet (81 mg total) by mouth daily. 04/02/20   Albertine GratesXu, Fang, MD  atorvastatin (LIPITOR) 20 MG tablet TAKE 2 TABLETS BY MOUTH EVERY DAY 06/03/21   [name], [name], MD  fluticasone [facility](FLONASE) 50 MCG/ACT nasal spray SPRAY 2 SPRAYS INTO EACH NOSTRIL EVERY DAY 01/11/21   Just, Azalee CourseKelsea J, FNP  ibuprofen (ADVIL) 800 MG tablet Take 1 tablet (800 mg total) by mouth 3 (three) times daily. Patient not taking: Reported on 04/26/2021 03/27/21   Rhys MartiniGraham, Laura E, PA-C  lisinopril (ZESTRIL) 20 MG tablet TAKE 1 TABLET BY MOUTH EVERY DAY 05/22/21   [name],  [name], MD  metFORMIN (GLUCOPHAGE) 1000 MG tablet TAKE 1 TABLET (1,000 MG TOTAL) BY MOUTH 2 (TWO) TIMES DAILY WITH A MEAL. 04/07/21   [name], [name], MD  metoprolol succinate (TOPROL-XL) 25 MG 24 hr tablet TAKE 1 TABLET BY MOUTH EVERY DAY 03/26/21   Little IshikawaSchumann, Christopher L, MD  Semaglutide,0.25 or 0.5MG /DOS, (OZEMPIC, 0.25 OR 0.5 MG/DOSE,) 2 MG/1.5ML SOPN Inject 0.5 mg into the skin once a week. 04/26/21   [name], [name], MD  sildenafil (VIAGRA) 100 MG tablet Take 0.5-1 tablets (50-100 mg total) by mouth daily as needed for erectile dysfunction. 10/30/20   [name], [name], MD  tadalafil (CIALIS) 20 MG tablet Take 0.5-1 tablets (10-20 mg total) by mouth every other day as needed for erectile dysfunction. 04/26/21   [name], [name], MD    Allergies    Glipizide, Doxycycline, and Erythromycin base  Review of Systems   Review of Systems  Neurological:  Positive for headaches.  All other systems reviewed and are negative.  Physical Exam Updated Vital Signs BP (!) 160/98 (BP Location: Left Arm)   Pulse 92   Temp 98.1 F (36.7 C)   Resp 20   SpO2 97%   Physical Exam Vitals and nursing note reviewed.  Constitutional:      General: He is not in acute distress.    Appearance: He is well-developed.  HENT:     Head: Atraumatic.     Mouth/Throat:     Mouth: Mucous membranes are moist.  Eyes:     Conjunctiva/sclera: Conjunctivae normal.  Cardiovascular:     Rate and Rhythm: Normal rate and regular rhythm.     Heart sounds: Normal heart sounds.  Pulmonary:     Effort: Pulmonary effort is normal.     Breath sounds: Normal breath sounds.  Abdominal:     Palpations: Abdomen is soft.     Tenderness: There is no abdominal tenderness. There is no guarding.  Musculoskeletal:        General: Normal range of motion.     Cervical back: Neck supple. No rigidity.  Skin:    General: Skin is warm.     Findings: No rash.  Neurological:     Mental Status: He is alert  and oriented to person, place, and time.     GCS: GCS eye subscore is 4. GCS verbal subscore is 5. GCS motor subscore is 6.     Cranial Nerves: No cranial nerve deficit.     Sensory: No sensory deficit.  Psychiatric:        Mood and Affect: Mood normal.    ED Results / Procedures / Treatments   Labs (all labs ordered are listed, but only abnormal results are displayed) Labs Reviewed  CBC WITH DIFFERENTIAL/PLATELET - Abnormal; Notable for the  following components:      Result Value   Hemoglobin 12.8 (*)    MCV 77.3 (*)    MCH 23.6 (*)    RDW 15.8 (*)    All other components within normal limits  COMPREHENSIVE METABOLIC PANEL - Abnormal; Notable for the following components:   Glucose, Bld 152 (*)    All other components within normal limits  CBG MONITORING, ED - Abnormal; Notable for the following components:   Glucose-Capillary 169 (*)    All other components within normal limits  RESP PANEL BY RT-PCR (FLU A&B, COVID) ARPGX2  LIPASE, BLOOD  URINALYSIS, ROUTINE W REFLEX MICROSCOPIC  TROPONIN I (HIGH SENSITIVITY)  TROPONIN I (HIGH SENSITIVITY)    EKG None  Date: 07/17/2021  Rate: 89  Rhythm: normal sinus rhythm  QRS Axis: normal  Intervals: normal  ST/T Wave abnormalities: normal  Conduction Disutrbances: none  Narrative Interpretation:   Old EKG Reviewed: No significant changes noted    Radiology CT HEAD WO CONTRAST ( )  Result Date: 07/17/2021 CLINICAL DATA:  Headache with nausea and vomiting EXAM: CT HEAD WITHOUT CONTRAST TECHNIQUE: Contiguous axial images were obtained from the base of the skull through the vertex without intravenous contrast. COMPARISON:  None. FINDINGS: Brain: No evidence of acute infarction, hemorrhage, hydrocephalus, extra-axial collection or mass lesion/mass effect. Vascular: No hyperdense vessel or unexpected calcification. Skull: Normal. Negative for fracture or focal lesion. Sinuses/Orbits: No acute finding. IMPRESSION: Negative head CT.  Electronically Signed   By: Marnee Spring M.D.   On: 07/17/2021 07:39   DG Abdomen Acute W/Chest  Result Date: 07/17/2021 CLINICAL DATA:  Vomiting and diarrhea EXAM: DG ABDOMEN ACUTE WITH 1 VIEW CHEST COMPARISON:  Abdomen and pelvis CT 05/08/2011 FINDINGS: Normal heart size and mediastinal contours. There is no edema, consolidation, effusion, or pneumothorax. Normal bowel gas pattern. Moderate fluid distention of the stomach. No concerning mass effect or calcification. No pneumoperitoneum. IMPRESSION: Negative abdominal radiographs.  No acute cardiopulmonary disease. Electronically Signed   By: Marnee Spring M.D.   On: 07/17/2021 06:25    Procedures Procedures   Medications Ordered in ED Medications  ondansetron (ZOFRAN-ODT) disintegrating tablet 4 mg (4 mg Oral Given 07/17/21 0622)  sodium chloride 0.9 % bolus 1,000 mL (1,000 mLs Intravenous Bolus 07/17/21 0754)  ondansetron (ZOFRAN) injection 4 mg (4 mg Intravenous Given 07/17/21 0755)    ED Course  I have reviewed the triage vital signs and the nursing notes.  Pertinent labs & imaging results that were available during my care of the patient were reviewed by me and considered in my medical decision making (see chart for details).    MDM Rules/Calculators/A&P                           BP 139/88 (BP Location: Left Arm)   Pulse 78   Temp 97.9 F (36.6 C) (Oral)   Resp 10   SpO2 96% Comment: 82% validated in error, correct O2 updated.  Final Clinical Impression(s) / ED Diagnoses Final diagnoses:  Bad headache  Nausea vomiting and diarrhea    Rx / DC Orders ED Discharge Orders          Ordered    ibuprofen (ADVIL) 600 MG tablet  Every 6 hours PRN        07/17/21 1253    ondansetron (ZOFRAN) 4 MG tablet  Every 8 hours PRN        07/17/21 1253  7:46 AM Patient here with nausea vomiting diarrhea and headache for the past 3 days.  Patient overall well-appearing, no nuchal rigidity concerning for meningitis, and  sudden onset thunderclap headache concerning for subarachnoid hemorrhage, no focal neurodeficit concerning for stroke or space-occupying lesion.  No recent change in his medication to suggest medication side effect.  No recent sick contact.  COVID test is negative, labs otherwise reassuring.  Vital signs stable.  Initially patient was hypertensive likely due to not taking his home blood pressure medication.  At this time blood pressure has improved.  9:40 AM EKG unremarkable, normal delta troponin, COVID test negative, head CT scan unremarkable, acute abdominal series unremarkable.  Patient still feeling nauseous, light sensitivity and endorse headache.  Will give migraine cocktail.  12:52 PM After migraine cocktail patient reports feeling much better.  Nausea has since resolved.  Will provide supportive care and will discharge patient.  Precaution given.  Suspect foodborne illness.  Jake Fuhrmann. was evaluated in Emergency Department on 07/17/2021 for the symptoms described in the history of present illness. He was evaluated in the context of the global COVID-19 pandemic, which necessitated consideration that the patient might be at risk for infection with the SARS-CoV-2 virus that causes COVID-19. Institutional protocols and algorithms that pertain to the evaluation of patients at risk for COVID-19 are in a state of rapid change based on information released by regulatory bodies including the CDC and federal and state organizations. These policies and algorithms were followed during the patient's care in the ED.    Fayrene Helper, PA-C 07/17/21 1255    Arby Barrette, MD 07/19/21 [phone]

## 2021-07-31 DIAGNOSIS — G4733 Obstructive sleep apnea (adult) (pediatric): Secondary | ICD-10-CM | POA: Diagnosis not present

## 2021-08-19 NOTE — Progress Notes (Signed)
Cardiology Office Note:    Date:  08/22/2021   ID:  Walter MalesIsaiah Benedetti Jr., DOB 03/07/1971, MRN [id number]  PCP:  [name], [name], MD  Cardiologist:  Little Ishikawahristopher L Kirstan Fentress, MD  Electrophysiologist:  None   Referring MD: [name], [name], *   Chief Complaint  Patient presents with   Chest Pain     History of Present Illness:    Walter Malessaiah Schoffstall Jr. is a 50 y.o. male with a hx of hypertension, type 2 diabetes, obesity, hyperlipidemia, OSA who presents for follow-up.  He presented to the ED on 03/31/2020 with chest pain.  Reports that he was mowing his grass that day when he developed chest pain.  States that he felt his heart was racing and as he continued moving his symptoms worsen.  He eventually had to stop and his symptoms resolved over time.  On presentation to the ED, EKG revealed no ST/T wave abnormalities.  High-sensitivity troponin was 6 > 26 >18.  Coronary CTA was done on 04/01/2020 which showed calcium score 110 (95th percentile), nonobstructive CAD with mixed plaque in the proximal RCA causing mild (25 to 49%) stenosis.  5 mm pulmonary nodule also seen.  Echocardiogram on 05/24/2020 showed LVEF 45 to 50%, global hypokinesis, grade 1 diastolic dysfunction, normal RV function, no significant valvular disease.   Zio patch x14 days on 05/20/2020 showed 2 episodes of NSVT, longest lasting 6 beats.  Triggered events corresponded to sinus rhythm.  Cardiac MRI on 10/25/2020 showed normal LV function (EF 50%), normal RV function (EF 55%), no LGE.    Since last clinic visit, he reports that he has been doing okay.  Main issue is having knee and shoulder pain.  He has been undergoing injections in his knee.  Denies any chest pain, dyspnea, lightheadedness, syncope, lower extremity edema, or palpitations.  Reports compliance with CPAP but sometimes has to take it off because feels congested.  Recently started walking on treadmill but has been limited by knee pain.    Past Medical History:   Diagnosis Date   Allergy    Diabetes mellitus without complication (HCC)    Elevated cholesterol    Hernia    bi lateral hernia repair   History of kidney stones    Hypertension    Sleep apnea 2014   severe   Umbilical hernia without obstruction and without gangrene 09/03/2017    Past Surgical History:  Procedure Laterality Date   CARPAL TUNNEL RELEASE Right 2007   CARPAL TUNNEL RELEASE Left 10/28/2014   Procedure: LEFT CARPAL TUNNEL RELEASE;  Surgeon: Dairl PonderMatthew Weingold, MD;  Location: [facility];  Service: Orthopedics;  Laterality: Left;   FOOT SURGERY Left    INGUINAL HERNIA REPAIR     times two, each side   KIDNEY STONE SURGERY     MASS EXCISION Left 10/28/2014   Procedure: LEFT WRIST VOLAR MASS EXCISION;  Surgeon: Dairl PonderMatthew Weingold, MD;  Location: [facility];  Service: Orthopedics;  Laterality: Left;   UMBILICAL HERNIA REPAIR N/A 10/27/2017   Procedure: HERNIA REPAIR UMBILICAL ADULT;  Surgeon: Berna Bueonnor, Chelsea A, MD;  Location: [facility] OR;  Service: General;  Laterality: N/A;    Current Medications: Current Meds  Medication Sig   aspirin EC 81 MG EC tablet Take 1 tablet (81 mg total) by mouth daily.   atorvastatin (LIPITOR) 20 MG tablet TAKE 2 TABLETS BY MOUTH EVERY DAY   fluticasone (FLONASE) 50 MCG/ACT nasal spray SPRAY 2 SPRAYS INTO EACH NOSTRIL EVERY DAY  ibuprofen (ADVIL) 600 MG tablet Take 1 tablet (600 mg total) by mouth every 6 (six) hours as needed.   lisinopril (ZESTRIL) 20 MG tablet TAKE 1 TABLET BY MOUTH EVERY DAY   metFORMIN (GLUCOPHAGE) 1000 MG tablet TAKE 1 TABLET (1,000 MG TOTAL) BY MOUTH 2 (TWO) TIMES DAILY WITH A MEAL.   metoprolol succinate (TOPROL-XL) 25 MG 24 hr tablet TAKE 1 TABLET BY MOUTH EVERY DAY   Turmeric 500 MG CAPS Take 1 tablet by mouth 2 (two) times daily.     Allergies:   Glipizide, Doxycycline, and Erythromycin base   Social History   Socioeconomic History   Marital status: Married    Spouse name: PAXTON BINNS   Number of children: 2   Years of education: Associates   Highest education level: Not on file  Occupational History   Occupation: Merchandiser, retail    Comment: [employer]  Tobacco Use   Smoking status: Never   Smokeless tobacco: Never  Vaping Use   Vaping Use: Never used  Substance and Sexual Activity   Alcohol use: No    Alcohol/week: 0.0 standard drinks   Drug use: No   Sexual activity: Yes    Partners: Female    Birth control/protection: Condom  Other Topics Concern   Not on file  Social History Narrative   Lives with his wife and their 2 children.   He has 3 sons from a previous relationship that live independently.   Formerly in the [employer].   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   Social Determinants of Health   Financial Resource Strain: Not on file  Food Insecurity: Not on file  Transportation Needs: Not on file  Physical Activity: Not on file  Stress: Not on file  Social Connections: Not on file     Family History: The patient's family history includes Diabetes in his mother; Heart attack in his mother; Heart disease in his mother and sister; Hypertension in his father and sister; Stroke in his mother.  ROS:   Please see the history of present illness.     All other systems reviewed and are negative.  EKGs/Labs/Other Studies Reviewed:    The following studies were reviewed today:   EKG:  EKG is not ordered today. The EKG ordered at prior clinic visit shows sinus sinus rhythm, rate 88, no ST abnormalities  Coronary CTA 04/01/20: 1. Coronary calcium score of 110. This was 95th percentile for age and sex matched control.   2. Normal coronary origin with right dominance.   3. Nonobstructive CAD with mixed plaque in the proximal RCA causing mild (25-49%) stenosis   CAD-RADS 1. Minimal non-obstructive CAD (0-24%). Consider non-atherosclerotic causes of chest  pain. Consider preventive therapy and risk factor modification.  1. No acute abnormality. 2. Small peripheral densities along the lower lobes are nonspecific and likely related to atelectasis. 5 mm peripheral nodule in the left lower lobe is indeterminate. No follow-up needed if patient is low-risk. Non-contrast chest CT can be considered in 12 months if patient is high-risk. This recommendation follows the consensus statement: Guidelines for Management of Incidental Pulmonary Nodules Detected on CT Images: From the Fleischner Society 2017; Radiology 2017; 284:228-243. 3. Visualized liver has low-density and suspicious for hepatic steatosis.    Recent Labs: 07/17/2021: ALT 34; BUN 11; Creatinine, Ser 0.88; Hemoglobin 12.8; Platelets 259; Potassium 4.0; Sodium 137  Recent Lipid Panel    Component Value  Date/Time   CHOL 128 02/20/2021 0838   TRIG 194 (H) 02/20/2021 0838   HDL 28 (L) 02/20/2021 0838   CHOLHDL 4.6 02/20/2021 0838   CHOLHDL 4.8 05/02/2016 0855   VLDL 17 05/02/2016 0855   LDLCALC 67 02/20/2021 0838    Physical Exam:    VS:  BP 128/80 (BP Location: Right Arm, Patient Position: Sitting, Cuff Size: Large)   Pulse 87   Ht 5\' 7"  (1.702 m)   Wt 261 lb 3.2 oz (118.5 kg)   SpO2 96%   BMI 40.91 kg/m     Wt Readings from Last 3 Encounters:  08/22/21 261 lb 3.2 oz (118.5 kg)  04/26/21 262 lb (118.8 kg)  04/06/21 260 lb (117.9 kg)     GEN:   in no acute distress HEENT: Normal NECK: No JVD CARDIAC: RRR, no murmurs, rubs, gallops RESPIRATORY:  Clear to auscultation without rales, wheezing or rhonchi  ABDOMEN: Soft, non-tender, non-distended MUSCULOSKELETAL:  No edema; No deformity  SKIN: Warm and dry NEUROLOGIC:  Alert and oriented x 3 PSYCHIATRIC:  Normal affect   ASSESSMENT:    1. CAD in native artery   2. Class 3 severe obesity in adult, unspecified BMI, unspecified obesity type, unspecified whether serious comorbidity present (HCC)   3. Systolic dysfunction    4. Essential hypertension   5. Pulmonary nodule     PLAN:    CAD: Presented with chest pain to ED on 03/31/2020, coronary CTA showed  calcium score 110 (95th percentile), nonobstructive CAD with mixed plaque in the proximal RCA causing mild (25 to 49%) stenosis.   -Continue ASA 81 mg daily -Continue atorvastatin 40 mg daily  Systolic dysfunction: Echocardiogram 05/24/2020 showed LVEF 45 to 50%.  Currently on lisinopril 20 mg daily and toprol XL 25 mg daily.  Cardiac MRI on 10/25/2020 showed normal LV function (EF 50%), normal RV function (EF 55%), no LGE. -Continue lisinopril and metoprolol  Palpitations: Zio patch x14 days on 05/20/2020 showed 2 episodes of NSVT, longest lasting 6 beats.  Triggered events corresponded to sinus rhythm.  Hypertension: Continue lisinopril 20 mg daily and Toprol-XL 25 mg daily.  Appears controlled.    Pulmonary nodule: 5 mm nodule noted on coronary CTA 03/2020.  Given smoking history, checked repeat CT chest in 1 year.  CT chest 03/2021 showed 5 mm nodule that was more conspicuous than prior CT, 26-month follow-up recommended (due October 2022)  Type 2 diabetes: On Metformin and ozempic.  A1c 7.7 on 04/2021  Hyperlipidemia: LDL 67 on 02/20/21.  On atorvastatin 40 mg daily.    Leg pain: Normal ABIs on 03/13/2021  OSA: on CPAP  Obesity: Body mass index is 40.91 kg/m.  Will refer to healthy weight and wellness  RTC in 6 months  Medication Adjustments/Labs and Tests Ordered: Current medicines are reviewed at length with the patient today.  Concerns regarding medicines are outlined above.  Orders Placed This Encounter  Procedures   Ambulatory referral to [facility]    No orders of the defined types were placed in this encounter.   Patient Instructions  Medication Instructions:  No changes *If you need a refill on your cardiac medications before your next appointment, please call your pharmacy*   Lab Work: None ordered If you have labs (blood  work) drawn today and your tests are completely normal, you will receive your results only by: MyChart Message (if you have MyChart) OR A paper copy in the mail If you have any lab test that is  abnormal or we need to change your treatment, we will call you to review the results.   Testing/Procedures: None ordered   Follow-Up: At [facility], you and your health needs are our priority.  As part of our continuing mission to provide you with exceptional heart care, we have created designated Provider Care Teams.  These Care Teams include your primary Cardiologist (physician) and Advanced Practice Providers (APPs -  Physician Assistants and Nurse Practitioners) who all work together to provide you with the care you need, when you need it.  We recommend signing up for the patient portal called "MyChart".  Sign up information is provided on this After Visit Summary.  MyChart is used to connect with patients for Virtual Visits (Telemedicine).  Patients are able to view lab/test results, encounter notes, upcoming appointments, etc.  Non-urgent messages can be sent to your provider as well.   To learn more about what you can do with MyChart, go to [email or web address].    Your next appointment:   6 month(s)  The format for your next appointment:   In Person  Provider:   You may see Little Ishikawa, MD or one of the following Advanced Practice Providers on your designated Care Team:   Theodore Demark, PA-C Juanda Crumble, PA-C Joni Reining, DNP, ANP   Other Instructions A referral has been placed to [facility]   Signed, Little Ishikawa, MD  08/22/2021 3:52 PM    [facility]

## 2021-08-22 ENCOUNTER — Ambulatory Visit: Payer: BC Managed Care – PPO | Admitting: Cardiology

## 2021-08-22 ENCOUNTER — Encounter: Payer: Self-pay | Admitting: Cardiology

## 2021-08-22 ENCOUNTER — Other Ambulatory Visit: Payer: Self-pay

## 2021-08-22 VITALS — BP 128/80 | HR 87 | Ht 67.0 in | Wt 261.2 lb

## 2021-08-22 DIAGNOSIS — R911 Solitary pulmonary nodule: Secondary | ICD-10-CM

## 2021-08-22 DIAGNOSIS — I10 Essential (primary) hypertension: Secondary | ICD-10-CM | POA: Diagnosis not present

## 2021-08-22 DIAGNOSIS — I2510 Atherosclerotic heart disease of native coronary artery without angina pectoris: Secondary | ICD-10-CM

## 2021-08-22 DIAGNOSIS — I519 Heart disease, unspecified: Secondary | ICD-10-CM

## 2021-08-22 NOTE — Patient Instructions (Signed)
Medication Instructions:  No changes *If you need a refill on your cardiac medications before your next appointment, please call your pharmacy*   Lab Work: None ordered If you have labs (blood work) drawn today and your tests are completely normal, you will receive your results only by: MyChart Message (if you have MyChart) OR A paper copy in the mail If you have any lab test that is abnormal or we need to change your treatment, we will call you to review the results.   Testing/Procedures: None ordered   Follow-Up: At [facility], you and your health needs are our priority.  As part of our continuing mission to provide you with exceptional heart care, we have created designated Provider Care Teams.  These Care Teams include your primary Cardiologist (physician) and Advanced Practice Providers (APPs -  Physician Assistants and Nurse Practitioners) who all work together to provide you with the care you need, when you need it.  We recommend signing up for the patient portal called "MyChart".  Sign up information is provided on this After Visit Summary.  MyChart is used to connect with patients for Virtual Visits (Telemedicine).  Patients are able to view lab/test results, encounter notes, upcoming appointments, etc.  Non-urgent messages can be sent to your provider as well.   To learn more about what you can do with MyChart, go to [email or web address].    Your next appointment:   6 month(s)  The format for your next appointment:   In Person  Provider:   You may see Little Ishikawa, MD or one of the following Advanced Practice Providers on your designated Care Team:   Theodore Demark, PA-C Juanda Crumble, PA-C Joni Reining, DNP, ANP   Other Instructions A referral has been placed to [facility]

## 2021-08-31 DIAGNOSIS — G4733 Obstructive sleep apnea (adult) (pediatric): Secondary | ICD-10-CM | POA: Diagnosis not present

## 2021-09-01 ENCOUNTER — Other Ambulatory Visit: Payer: Self-pay | Admitting: Emergency Medicine

## 2021-09-18 ENCOUNTER — Other Ambulatory Visit: Payer: Self-pay

## 2021-09-18 ENCOUNTER — Ambulatory Visit (INDEPENDENT_AMBULATORY_CARE_PROVIDER_SITE_OTHER)
Admission: RE | Admit: 2021-09-18 | Discharge: 2021-09-18 | Disposition: A | Discharge status: Home / Self Care | Payer: BC Managed Care – PPO | Source: Ambulatory Visit | Attending: Cardiology | Admitting: Cardiology

## 2021-09-18 DIAGNOSIS — R911 Solitary pulmonary nodule: Secondary | ICD-10-CM

## 2021-09-20 ENCOUNTER — Other Ambulatory Visit: Payer: Self-pay | Admitting: *Deleted

## 2021-09-20 DIAGNOSIS — R911 Solitary pulmonary nodule: Secondary | ICD-10-CM

## 2021-09-30 DIAGNOSIS — G4733 Obstructive sleep apnea (adult) (pediatric): Secondary | ICD-10-CM | POA: Diagnosis not present

## 2021-10-11 ENCOUNTER — Encounter: Payer: Self-pay | Admitting: Pulmonary Disease

## 2021-10-11 ENCOUNTER — Telehealth: Payer: Self-pay | Admitting: Pharmacy Technician

## 2021-10-11 ENCOUNTER — Ambulatory Visit: Payer: BC Managed Care – PPO | Admitting: Pulmonary Disease

## 2021-10-11 ENCOUNTER — Other Ambulatory Visit: Payer: Self-pay

## 2021-10-11 ENCOUNTER — Other Ambulatory Visit (HOSPITAL_COMMUNITY): Payer: Self-pay

## 2021-10-11 VITALS — BP 120/80 | HR 89 | Temp 97.9°F | Ht 67.0 in | Wt 262.4 lb

## 2021-10-11 DIAGNOSIS — G4733 Obstructive sleep apnea (adult) (pediatric): Secondary | ICD-10-CM | POA: Diagnosis not present

## 2021-10-11 MED ORDER — MODAFINIL 200 MG PO TABS: 200.0000 mg | ORAL_TABLET | Freq: Every day | ORAL | 2 refills | Status: DC

## 2021-10-11 NOTE — Patient Instructions (Signed)
.    Obtain overnight oximetry with CPAP in place  .  I will start you on Provigil which is a stimulant to help with sleepiness during the day -This is the option of treatment for people who have sleep apnea that is well treated with CPAP but still very sleepy during the day as long as sleep apnea is well treated, some people still have sleepiness.  .  Encouraged regular exercises and weight loss efforts  .  I will see you back in 3 months  .  Continue using CPAP on a nightly basis  .  Call us if you have any significant concerns

## 2021-10-11 NOTE — Progress Notes (Signed)
[facility]    [id number]    November 19, 1971  Primary Care Physician:Sagardia, Eilleen Kempf, MD  Referring Physician: Georgina Quint, MD [address] [place],  [place] [address]  Chief complaint:   Patient with a history of obstructive sleep apnea diagnosed about 6 years ago Had a recent repeat sleep study showing severe obstructive sleep apnea, moderate oxygen desaturations  HPI:  Sleep apnea diagnosed about 6 years ago Had a repeat study recently showing severe obstructive sleep apnea  Still has daytime sleepiness Can easily fall asleep being a passenger in a car Uses CPAP regularly  Usually goes to bed between 9 and 11 PM Falls asleep easily About 1-2 awakenings Final wake up time between 6 and 7 AM  Weight has remained stable about 5 to 10 pounds up the last couple years  He does not know his current CPAP settings  Does have a history of hypertension, diabetes, hypercholesterolemia  Does not smoke Does not use alcohol  Admits to dry mouth in the mornings No morning headaches Denies excessive sweating at night  Dad has obstructive sleep apnea  Outpatient Encounter Medications as of 09/20/2020  Medication Sig   aspirin EC 81 MG EC tablet Take 1 tablet (81 mg total) by mouth daily.   atorvastatin (LIPITOR) 20 MG tablet Take 2 tablets (40 mg total) by mouth daily.   cyclobenzaprine (FLEXERIL) 5 MG tablet Take 1 tablet (5 mg total) by mouth 3 (three) times daily as needed.   fluticasone (FLONASE) 50 MCG/ACT nasal spray SPRAY 2 SPRAYS INTO EACH NOSTRIL EVERY DAY (Patient taking differently: Place 2 sprays into both nostrils daily. )   ibuprofen (ADVIL) 800 MG tablet Take 1 tablet (800 mg total) by mouth 3 (three) times daily.   metFORMIN (GLUCOPHAGE) 1000 MG tablet Take 1 tablet (1,000 mg total) by mouth 2 (two) times daily with a meal.   metoprolol succinate (TOPROL XL) 25 MG 24 hr tablet Take 1 tablet (25 mg total) by mouth daily.   lisinopril  (ZESTRIL) 20 MG tablet Take 1 tablet (20 mg total) by mouth daily.   No facility-administered encounter medications on file as of 09/20/2020.    Allergies as of 09/20/2020 - Review Complete 09/20/2020  Allergen Reaction Noted   Glipizide Shortness Of Breath 07/17/2020   Doxycycline Hives 06/01/2015   Erythromycin base Hives 10/16/2012    Past Medical History:  Diagnosis Date   Allergy    Diabetes mellitus without complication (HCC)    Elevated cholesterol    Hernia    bi lateral hernia repair   History of kidney stones    Hypertension    Sleep apnea 2014   severe   Umbilical hernia without obstruction and without gangrene 09/03/2017    Past Surgical History:  Procedure Laterality Date   CARPAL TUNNEL RELEASE Right 2007   CARPAL TUNNEL RELEASE Left 10/28/2014   Procedure: LEFT CARPAL TUNNEL RELEASE;  Surgeon: Dairl Ponder, MD;  Location: [facility];  Service: Orthopedics;  Laterality: Left;   FOOT SURGERY Left    INGUINAL HERNIA REPAIR     times two, each side   KIDNEY STONE SURGERY     MASS EXCISION Left 10/28/2014   Procedure: LEFT WRIST VOLAR MASS EXCISION;  Surgeon: Dairl Ponder, MD;  Location: [facility];  Service: Orthopedics;  Laterality: Left;   UMBILICAL HERNIA REPAIR N/A 10/27/2017   Procedure: HERNIA REPAIR UMBILICAL ADULT;  Surgeon: Berna Bue, MD;  Location: [facility];  Service: General;  Laterality: N/A;    Family History  Problem Relation Age of Onset   Diabetes Mother    Stroke Mother        4 strokes   Heart attack Mother    Heart disease Mother    Hypertension Father    Hypertension Sister    Heart disease Sister        pacemaker    Social History   Socioeconomic History   Marital status: Married    Spouse name: ARMONDO CECH   Number of children: 2   Years of education: Associates   Highest education level: Not on file  Occupational History   Occupation: Merchandiser, retail    Comment: [employer]   Tobacco Use   Smoking status: Never Smoker   Smokeless tobacco: Never Used  Building services engineer Use: Never used  Substance and Sexual Activity   Alcohol use: No    Alcohol/week: 0.0 standard drinks   Drug use: No   Sexual activity: Yes    Partners: Female    Birth control/protection: Condom  Other Topics Concern   Not on file  Social History Narrative   Lives with his wife and their 2 children.   He has 3 sons from a previous relationship that live independently.   Formerly in the [employer].   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   Social Determinants of Health   Financial Resource Strain:    Difficulty of Paying Living Expenses: Not on file  Food Insecurity:    Worried About Programme researcher, broadcasting/film/video in the Last Year: Not on file   [employer] of Food in the Last Year: Not on file  Transportation Needs:    Lack of Transportation (Medical): Not on file   Lack of Transportation (Non-Medical): Not on file  Physical Activity:    Days of Exercise per Week: Not on file   Minutes of Exercise per Session: Not on file  Stress:    Feeling of Stress : Not on file  Social Connections:    Frequency of Communication with Friends and Family: Not on file   Frequency of Social Gatherings with Friends and Family: Not on file   Attends Religious Services: Not on file   Active Member of Clubs or Organizations: Not on file   Attends Banker Meetings: Not on file   Marital Status: Not on file  Intimate Partner Violence:    Fear of Current or Ex-Partner: Not on file   Emotionally Abused: Not on file   Physically Abused: Not on file   Sexually Abused: Not on file    Review of Systems  Constitutional:  Positive for fatigue.  HENT: Negative.    Respiratory:  Positive for apnea.   Cardiovascular: Negative.   Psychiatric/Behavioral:  Positive for sleep disturbance.   All other systems reviewed  and are negative.  Vitals:   09/20/20 1004  BP: 122/78  Pulse: 80  Temp: (!) 97.3 F (36.3 C)  SpO2: 95%     Physical Exam Constitutional:      Appearance: He is obese.  HENT:     Head: Normocephalic.     Mouth/Throat:     Mouth: Mucous membranes are moist.     Pharynx: No oropharyngeal exudate or posterior oropharyngeal erythema.     Comments: Macroglossia, crowded oropharynx, Mallampati 4 Eyes:     General:  Right eye: No discharge.        Left eye: No discharge.     Pupils: Pupils are equal, round, and reactive to light.  Cardiovascular:     Rate and Rhythm: Normal rate and regular rhythm.     Pulses: Normal pulses.     Heart sounds: Normal heart sounds. No murmur heard.   No friction rub.  Pulmonary:     Effort: Pulmonary effort is normal. No respiratory distress.     Breath sounds: Normal breath sounds. No stridor. No wheezing or rhonchi.  Musculoskeletal:     Cervical back: No rigidity or tenderness.  Neurological:     Mental Status: He is alert.  Psychiatric:        Mood and Affect: Mood normal.   Previous study not available for review Results of the Epworth flowsheet 09/20/2020  Sitting and reading 0  Watching TV 0  Sitting, inactive in a public place (e.g. a theatre or a meeting) 0  As a passenger in a car for an hour without a break 3  Lying down to rest in the afternoon when circumstances permit 0  Sitting and talking to someone 0  Sitting quietly after a lunch without alcohol 0  In a car, while stopped for a few minutes in traffic 1  Total score 4   Data Reviewed: Recent echo with ejection fraction of 45 to 50% with global hypokinesis on the left, diastolic dysfunction normal right ventricular function.  Compliance data reveals 97% compliance with CPAP Average use of 6 hours 30 minutes Setting of 5-15 95 percentile pressure of 11.3 No significant leaks Residual AHI 1.5  Assessment:  History of obstructive sleep apnea -Severe  obstructive sleep apnea with moderate oxygen desaturations -He appears to be sleeping well although having periods where he wakes up and down not able to go back immediately to sleep -Has no difficulty falling asleep -Remains very sleepy during the day   Excessive daytime sleepiness with daytime fatigue -Despite regular use of CPAP for 6-1/2 hours -Waking up feeling like hes had a decent nights rest on most nights -Half an hour drive as a passenger makes him sleepy  Pathophysiology of sleep disordered breathing reviewed Treatment options discussed  Importance of weight management discussed  Regular exercises and diet encouraged  Plan/Recommendations: Obtain an overnight oximetry to ascertain that he is not desaturating with CPAP use  Continue auto CPAP therapy -Compliance shows excellent compliance with adequate treatment of sleep disordered breathing  Importance of weight management discussed  Risk of not adequately treating sleep apnea discussed  Excessive daytime sleepiness will be treated with a trial with Provigil  I spent 30 minutes dedicated to the care of this patient on the date of this encounter to include previsit review of records, face-to-face time with the patient discussing conditions above, post visit ordering of testing, clinical documentation with electronic health record and communicated necessary findings to members of the patient's care team  Virl Diamond MD  Pulmonary and Critical Care 09/20/2020, 10:42 AM  CC: Georgina Quint, *

## 2021-10-12 ENCOUNTER — Other Ambulatory Visit: Payer: Self-pay | Admitting: Pulmonary Disease

## 2021-10-12 MED ORDER — ARMODAFINIL 200 MG PO TABS: 200.0000 mg | ORAL_TABLET | Freq: Every day | ORAL | 3 refills | Status: DC

## 2021-10-12 NOTE — Progress Notes (Signed)
Prescription for armodafinil sent in to pharmacy

## 2021-10-12 NOTE — Telephone Encounter (Signed)
Patient is aware of below message and voiced his understanding.  Nothing further needed.   

## 2021-10-12 NOTE — Telephone Encounter (Signed)
Prescription for armodafinil sent in to pharmacy 

## 2021-10-12 NOTE — Telephone Encounter (Signed)
AO, please advise. Thanks.  

## 2021-10-16 ENCOUNTER — Encounter: Payer: Self-pay | Admitting: Pulmonary Disease

## 2021-10-16 DIAGNOSIS — R0683 Snoring: Secondary | ICD-10-CM | POA: Diagnosis not present

## 2021-10-16 DIAGNOSIS — G4730 Sleep apnea, unspecified: Secondary | ICD-10-CM | POA: Diagnosis not present

## 2021-10-25 ENCOUNTER — Emergency Department (HOSPITAL_COMMUNITY): Payer: BC Managed Care – PPO

## 2021-10-25 ENCOUNTER — Emergency Department (HOSPITAL_COMMUNITY)
Admission: EM | Admit: 2021-10-25 | Discharge: 2021-10-25 | Disposition: A | Discharge status: Home / Self Care | Payer: BC Managed Care – PPO | Attending: Student | Admitting: Student

## 2021-10-25 DIAGNOSIS — Z79899 Other long term (current) drug therapy: Secondary | ICD-10-CM | POA: Insufficient documentation

## 2021-10-25 DIAGNOSIS — Z7982 Long term (current) use of aspirin: Secondary | ICD-10-CM | POA: Diagnosis not present

## 2021-10-25 DIAGNOSIS — R0602 Shortness of breath: Secondary | ICD-10-CM | POA: Diagnosis not present

## 2021-10-25 DIAGNOSIS — E1165 Type 2 diabetes mellitus with hyperglycemia: Secondary | ICD-10-CM | POA: Diagnosis not present

## 2021-10-25 DIAGNOSIS — R0789 Other chest pain: Secondary | ICD-10-CM | POA: Diagnosis not present

## 2021-10-25 DIAGNOSIS — D649 Anemia, unspecified: Secondary | ICD-10-CM | POA: Diagnosis not present

## 2021-10-25 DIAGNOSIS — R079 Chest pain, unspecified: Secondary | ICD-10-CM | POA: Diagnosis not present

## 2021-10-25 DIAGNOSIS — E785 Hyperlipidemia, unspecified: Secondary | ICD-10-CM | POA: Diagnosis not present

## 2021-10-25 DIAGNOSIS — Z7984 Long term (current) use of oral hypoglycemic drugs: Secondary | ICD-10-CM | POA: Diagnosis not present

## 2021-10-25 DIAGNOSIS — E1169 Type 2 diabetes mellitus with other specified complication: Secondary | ICD-10-CM | POA: Diagnosis not present

## 2021-10-25 DIAGNOSIS — I10 Essential (primary) hypertension: Secondary | ICD-10-CM | POA: Insufficient documentation

## 2021-10-25 DIAGNOSIS — R002 Palpitations: Secondary | ICD-10-CM | POA: Diagnosis not present

## 2021-10-25 LAB — CBC WITH DIFFERENTIAL/PLATELET
Abs Immature Granulocytes: 0.02 10*3/uL (ref 0.00–0.07)
Basophils Absolute: 0 10*3/uL (ref 0.0–0.1)
Basophils Relative: 0 %
Eosinophils Absolute: 0.3 10*3/uL (ref 0.0–0.5)
Eosinophils Relative: 3 %
HCT: 40.1 % (ref 39.0–52.0)
Hemoglobin: 12.5 g/dL — ABNORMAL LOW (ref 13.0–17.0)
Immature Granulocytes: 0 %
Lymphocytes Relative: 40 %
Lymphs Abs: 3.2 10*3/uL (ref 0.7–4.0)
MCH: 24.1 pg — ABNORMAL LOW (ref 26.0–34.0)
MCHC: 31.2 g/dL (ref 30.0–36.0)
MCV: 77.3 fL — ABNORMAL LOW (ref 80.0–100.0)
Monocytes Absolute: 0.6 10*3/uL (ref 0.1–1.0)
Monocytes Relative: 8 %
Neutro Abs: 3.9 10*3/uL (ref 1.7–7.7)
Neutrophils Relative %: 49 %
Platelets: 263 10*3/uL (ref 150–400)
RBC: 5.19 MIL/uL (ref 4.22–5.81)
RDW: 15.6 % — ABNORMAL HIGH (ref 11.5–15.5)
WBC: 8 10*3/uL (ref 4.0–10.5)
nRBC: 0 % (ref 0.0–0.2)

## 2021-10-25 LAB — BASIC METABOLIC PANEL
Anion gap: 10 (ref 5–15)
BUN: 13 mg/dL (ref 6–20)
CO2: 25 mmol/L (ref 22–32)
Calcium: 9 mg/dL (ref 8.9–10.3)
Chloride: 101 mmol/L (ref 98–111)
Creatinine, Ser: 0.92 mg/dL (ref 0.61–1.24)
GFR, Estimated: >60 mL/min (ref >60)
Glucose, Bld: 261 mg/dL — ABNORMAL HIGH (ref 70–99)
Potassium: 4.2 mmol/L (ref 3.5–5.1)
Sodium: 136 mmol/L (ref 135–145)

## 2021-10-25 LAB — TROPONIN I (HIGH SENSITIVITY)
Troponin I (High Sensitivity): 5 ng/L (ref <18)
Troponin I (High Sensitivity): 4 ng/L (ref <18)

## 2021-10-25 NOTE — ED Triage Notes (Signed)
Pt. Stated, I feel like I cant get my breath all the way and feel like my heart is fluttering. Might be my medication, cause when I put on my C-PAP it made me feel better.

## 2021-10-25 NOTE — Discharge Instructions (Signed)
As we discussed your work-up today was reassuring, do not see any cardiac or pulmonary causes of your shortness of breath, or heart palpitations today.  I do believe that some the side effects could be related to your new stimulant medication, and I recommend that you discuss this medication with your primary care doctor, and cardiologist.  Please follow-up if you begin to have worsening palpitations, chest pain, shortness of breath in the future.

## 2021-10-25 NOTE — ED Notes (Signed)
Phlebotomist is coming to draw labs. 

## 2021-10-25 NOTE — ED Notes (Signed)
Labs drawn, sent as save tubes

## 2021-10-25 NOTE — ED Notes (Signed)
Attempted IV with no success. 

## 2021-10-25 NOTE — ED Provider Notes (Signed)
[facility] Provider Note   CSN: [id number] Arrival date & time: 10/25/21  [id number]     History Chief Complaint  Patient presents with   Palpitations   Shortness of Breath    Walter Filla. is a 50 y.o. male with past medical history of hypertension, hypercholesterolemia, diabetes, and obstructive sleep apnea who presents with several days of complaints of heart palpitations, shortness of breath.  Patient reports that her palpitations, shortness of breath, as well as some chest pain, chest tightness occur both at rest and while he is doing activities, no association with exertion.  Patient denies worsening pain with deep inspiration.  Patient reports some resolution of pain with his CPAP machine.  Patient was recently started on armodafinil for narcoleptic episodes thought to be related to his obstructive sleep apnea, and believes that some of the symptoms started at the same time as his new medication.  Patient denies history of tobacco use, does have an extensive family history of ACS, coronary artery disease.  Patient denies the chest pain, shortness of breath, chest tightness during my exam, reports that he has not taken his armodafinil for the last couple of days in order to see if it would improve his symptoms.   Palpitations Associated symptoms: chest pain and shortness of breath   Shortness of Breath Associated symptoms: chest pain       Past Medical History:  Diagnosis Date   Allergy    Diabetes mellitus without complication (HCC)    Elevated cholesterol    Hernia    bi lateral hernia repair   History of kidney stones    Hypertension    Sleep apnea [id number]   severe   Umbilical hernia without obstruction and without gangrene 09/03/2017    Patient Active Problem List   Diagnosis Date Noted   Chronic right shoulder pain 04/27/2021   Erectile dysfunction 04/26/2021   Lack of energy 04/26/2021   Body mass index (BMI) of 40.1-44.9 in  adult ([place]) 07/17/2020   Hypertension associated with diabetes ([place]) 09/27/2019   Morbid obesity ([place]) 09/27/2019   Dyslipidemia 09/09/2018   Environmental allergies 09/09/2018   BMI 39.0-39.9,adult 09/03/2017   Dyslipidemia associated with type 2 diabetes mellitus ([place]) 09/03/2017   Anemia 04/14/2017   Hyperlipidemia 05/02/2016   OSA (obstructive sleep apnea) 09/06/2015   Benign essential HTN 09/06/2015    Past Surgical History:  Procedure Laterality Date   CARPAL TUNNEL RELEASE Right 2007   CARPAL TUNNEL RELEASE Left 10/28/2014   Procedure: LEFT CARPAL TUNNEL RELEASE;  Surgeon: Charlotte Crumb, MD;  Location: [facility];  Service: Orthopedics;  Laterality: Left;   FOOT SURGERY Left    INGUINAL HERNIA REPAIR     times two, each side   KIDNEY STONE SURGERY     MASS EXCISION Left 10/28/2014   Procedure: LEFT WRIST VOLAR MASS EXCISION;  Surgeon: Charlotte Crumb, MD;  Location: [facility];  Service: Orthopedics;  Laterality: Left;   UMBILICAL HERNIA REPAIR N/A 10/27/2017   Procedure: HERNIA REPAIR UMBILICAL ADULT;  Surgeon: Clovis Riley, MD;  [facility];  Service: General;  Laterality: N/A;       Family History  Problem Relation Age of Onset   Diabetes Mother    Stroke Mother        4 strokes   Heart attack Mother    Heart disease Mother    Hypertension Father    Hypertension Sister    Heart disease Sister  pacemaker    Social History   Tobacco Use   Smoking status: Never   Smokeless tobacco: Never  Vaping Use   Vaping Use: Never used  Substance Use Topics   Alcohol use: No    Alcohol/week: 0.0 standard drinks   Drug use: No    Home Medications Prior to Admission medications   Medication Sig Start Date End Date Taking? Authorizing Provider  Armodafinil 200 MG TABS Take 200 mg by mouth daily. 10/12/21  Yes Olalere, Adewale A, MD  aspirin EC 81 MG EC tablet Take 1 tablet (81 mg total) by mouth daily. 04/02/20  Yes  Florencia Reasons, MD  atorvastatin (LIPITOR) 20 MG tablet TAKE 2 TABLETS BY MOUTH EVERY DAY Patient taking differently: Take 40 mg by mouth daily. 09/01/21  Yes Sagardia, Ines Bloomer, MD  fluticasone (FLONASE) 50 MCG/ACT nasal spray SPRAY 2 SPRAYS INTO EACH NOSTRIL EVERY DAY Patient taking differently: Place 2 sprays into both nostrils daily. 01/11/21  Yes Just, Laurita Quint, FNP  lisinopril (ZESTRIL) 20 MG tablet TAKE 1 TABLET BY MOUTH EVERY DAY Patient taking differently: Take 20 mg by mouth daily. 05/22/21  Yes Sagardia, Ines Bloomer, MD  metFORMIN (GLUCOPHAGE) 1000 MG tablet TAKE 1 TABLET (1,000 MG TOTAL) BY MOUTH 2 (TWO) TIMES DAILY WITH A MEAL. 07/17/21  Yes Sagardia, Ines Bloomer, MD  metoprolol succinate (TOPROL-XL) 25 MG 24 hr tablet TAKE 1 TABLET BY MOUTH EVERY DAY Patient taking differently: Take 25 mg by mouth daily. 03/26/21  Yes Donato Heinz, MD  ibuprofen (ADVIL) 600 MG tablet Take 1 tablet (600 mg total) by mouth every 6 (six) hours as needed. Patient not taking: Reported on 10/25/2021 07/17/21   Domenic Moras, PA-C    Allergies    Doxycycline, Erythromycin base, and Glipizide  Review of Systems   Review of Systems  Respiratory:  Positive for chest tightness and shortness of breath.   Cardiovascular:  Positive for chest pain and palpitations.  All other systems reviewed and are negative.  Physical Exam Updated Vital Signs BP 127/87   Pulse 78   Temp 98.5 F (36.9 C) (Oral)   Resp 15   SpO2 99%   Physical Exam Vitals and nursing note reviewed.  Constitutional:      General: He is not in acute distress.    Appearance: Normal appearance.  HENT:     Head: Normocephalic and atraumatic.     Comments: Large tongue, tonsillar pillars, neck circumference consistent with obstructive sleep apnea Eyes:     General:        Right eye: No discharge.        Left eye: No discharge.  Cardiovascular:     Rate and Rhythm: Normal rate and regular rhythm.     Heart sounds: No murmur  heard.   No friction rub. No gallop.  Pulmonary:     Effort: Pulmonary effort is normal. No respiratory distress.     Breath sounds: Normal breath sounds. No wheezing.     Comments: Somewhat shallow breaths, however patient can take deeper breaths on prompting, no wheezing, rhonchi, rales, no accessory muscle use, no respiratory distress Abdominal:     General: Bowel sounds are normal.     Palpations: Abdomen is soft.     Tenderness: There is no abdominal tenderness.  Skin:    General: Skin is warm and dry.     Capillary Refill: Capillary refill takes less than 2 seconds.  Neurological:     Mental Status: He is alert  and oriented to person, place, and time.  Psychiatric:        Mood and Affect: Mood normal.        Behavior: Behavior normal.    ED Results / Procedures / Treatments   Labs (all labs ordered are listed, but only abnormal results are displayed) Labs Reviewed  CBC WITH DIFFERENTIAL/PLATELET - Abnormal; Notable for the following components:      Result Value   Hemoglobin 12.5 (*)    MCV 77.3 (*)    MCH 24.1 (*)    RDW 15.6 (*)    All other components within normal limits  BASIC METABOLIC PANEL - Abnormal; Notable for the following components:   Glucose, Bld 261 (*)    All other components within normal limits  TROPONIN I (HIGH SENSITIVITY)  TROPONIN I (HIGH SENSITIVITY)    EKG None  Radiology DG Chest 2 View  Result Date: 10/25/2021 CLINICAL DATA:  Chest pain. EXAM: CHEST - 2 VIEW COMPARISON:  March 31, 2020. FINDINGS: The heart size and mediastinal contours are within normal limits. Both lungs are clear. The visualized skeletal structures are unremarkable. IMPRESSION: No active cardiopulmonary disease. Electronically Signed   By: Marijo Conception M.D.   On: 10/25/2021 10:17    Procedures Procedures   Medications Ordered in ED Medications - No data to display  ED Course  I have reviewed the triage vital signs and the nursing notes.  Pertinent labs &  imaging results that were available during my care of the patient were reviewed by me and considered in my medical decision making (see chart for details).  Clinical Course as of 10/25/21 1254  Thu Oct 25, 2021  [id number] EKG 12-Lead [MK]    Clinical Course User Index [MK] Kommor, Debe Coder, MD   MDM Rules/Calculators/A&P                         Given the large differential diagnosis for Zidane Roshell., the decision making in this case is of high complexity.  After evaluating all of the data points in this case, the presentation of Rowland Soong. is NOT consistent with Acute Coronary Syndrome (ACS) and/or myocardial ischemia, pulmonary embolism, aortic dissection; Borhaave's, significant arrythmia, pneumothorax, cardiac tamponade, or other emergent cardiopulmonary condition.  Further, the presentation of Dinh Sawicki. is NOT consistent with pericarditis, myocarditis, cholecystitis, pancreatitis, mediastinitis, endocarditis, new valvular disease.  Additionally, the presentation of Coletin Sigrist Jr.is NOT consistent with flail chest, cardiac contusion, ARDS, or significant intra-thoracic or intra-abdominal bleeding.  Moreover, this presentation is NOT consistent with pneumonia, sepsis, or pyelonephritis.  The patient has a  new prescription for stimulant medication that is consistent with causing feelings for shortness of breath, heart racing.  Overall well-appearing patient with new symptoms of shortness of breath, chest tightness versus pain, not related to exertion, without radiation, difficult to localize secondary to beginning new stimulant medication, armodafinil for narcolepsy. Patient has elevated risk for cardiac cause of chest pain given BMI greater than 30, diabetes, hypertension, strong family history of ACS and heart disease.  Patient reports that he has a cardiologist and saw him last month with overall reassuring work-up.  Given the only recent changes this new medication  do believe that it could be related to his symptoms.  Will evaluate EKG, chest x-ray, basic lab work, as well as troponins to rule out cardiac cause of chest pain at this time. Negative troponin x 2, reassuring EKG, CXR. CBC significant  only for mild stable anemia. BMP shows elevated blood glucose consistent with diabetes.  Heart score is 3. Feel patient is stable to follow up outpatient with PCP, cardiology to discuss risk / benefit of changing from new stimulant medication.  Strict return and follow-up precautions have been given by me personally or by detailed written instruction given verbally by nursing staff using the teach back method to the patient/family/caregiver(s).  Data Reviewed/Counseling: I have reviewed the patient's vital signs, nursing notes, and other relevant tests/information. I had a detailed discussion regarding the historical points, exam findings, and any diagnostic results supporting the discharge diagnosis. I also discussed the need for outpatient follow-up and the need to return to the ED if symptoms worsen or if there are any questions or concerns that arise at home. Final Clinical Impression(s) / ED Diagnoses Final diagnoses:  Heart palpitations  Shortness of breath    Rx / DC Orders ED Discharge Orders     None        Olene Floss, PA-C 10/25/21 1254    Glendora Score, MD 10/25/21 1556

## 2021-10-29 ENCOUNTER — Telehealth: Payer: Self-pay | Admitting: Pulmonary Disease

## 2021-10-29 NOTE — Telephone Encounter (Signed)
Overnight oximetry reviewed on CPAP  No significant desaturations while on CPAP Does not need oxygen supplementation

## 2021-10-30 ENCOUNTER — Telehealth: Payer: Self-pay | Admitting: Pulmonary Disease

## 2021-10-31 DIAGNOSIS — G4733 Obstructive sleep apnea (adult) (pediatric): Secondary | ICD-10-CM | POA: Diagnosis not present

## 2021-11-05 ENCOUNTER — Ambulatory Visit: Payer: BC Managed Care – PPO | Admitting: Emergency Medicine

## 2021-11-05 ENCOUNTER — Other Ambulatory Visit: Payer: Self-pay

## 2021-11-05 ENCOUNTER — Encounter: Payer: Self-pay | Admitting: Emergency Medicine

## 2021-11-05 VITALS — BP 124/82 | HR 96 | Ht 67.0 in | Wt 260.0 lb

## 2021-11-05 DIAGNOSIS — E1159 Type 2 diabetes mellitus with other circulatory complications: Secondary | ICD-10-CM

## 2021-11-05 DIAGNOSIS — Z23 Encounter for immunization: Secondary | ICD-10-CM

## 2021-11-05 DIAGNOSIS — E1165 Type 2 diabetes mellitus with hyperglycemia: Secondary | ICD-10-CM | POA: Diagnosis not present

## 2021-11-05 DIAGNOSIS — I152 Hypertension secondary to endocrine disorders: Secondary | ICD-10-CM

## 2021-11-05 LAB — POCT GLYCOSYLATED HEMOGLOBIN (HGB A1C): Hemoglobin A1C: 9.4 % — AB (ref 4.0–5.6)

## 2021-11-05 MED ORDER — TIRZEPATIDE 2.5 MG/0.5ML ~~LOC~~ SOAJ: 2.5000 mg | SUBCUTANEOUS | 5 refills | Status: AC

## 2021-11-05 NOTE — Assessment & Plan Note (Signed)
Well-controlled hypertension. Continue metoprolol succinate 25 mg and lisinopril 20 mg daily. Uncontrolled diabetes with hemoglobin A1c of 9.4. Continue metformin 1000 mg twice a day and start weekly Mounjaro 2.5 mg.  If tolerated after 4 weeks will increase to 5 mg. Diet and nutrition discussed.  Advised to decrease amount of daily carbohydrate intake. Follow-up in 3 months.

## 2021-11-05 NOTE — Progress Notes (Signed)
Lab Results  Component Value Date   HGBA1C 7.7 (H) 04/26/2021   Walter Reynolds. 50 y.o.   Chief Complaint  Patient presents with   Diabetes    6 month f/u, pt would like to discuss weight loss and discuss medication mounjaro    HISTORY OF PRESENT ILLNESS: This is a 50 y.o. male with history of diabetes here for 21-month follow-up. Concerned about weight issues.  Wants to know more about medication called Mounjaro.  HPI   Prior to Admission medications   Medication Sig Start Date End Date Taking? Authorizing Provider  aspirin EC 81 MG EC tablet Take 1 tablet (81 mg total) by mouth daily. 04/02/20  Yes Albertine Grates, MD  atorvastatin (LIPITOR) 20 MG tablet TAKE 2 TABLETS BY MOUTH EVERY DAY Patient taking differently: Take 40 mg by mouth daily. 09/01/21  Yes Preston Garabedian, Eilleen Kempf, MD  fluticasone (FLONASE) 50 MCG/ACT nasal spray SPRAY 2 SPRAYS INTO EACH NOSTRIL EVERY DAY Patient taking differently: Place 2 sprays into both nostrils daily. 01/11/21  Yes Just, Azalee Course, FNP  ibuprofen (ADVIL) 600 MG tablet Take 1 tablet (600 mg total) by mouth every 6 (six) hours as needed. 07/17/21  Yes Fayrene Helper, PA-C  lisinopril (ZESTRIL) 20 MG tablet TAKE 1 TABLET BY MOUTH EVERY DAY Patient taking differently: Take 20 mg by mouth daily. 05/22/21  Yes Sherae Santino, Eilleen Kempf, MD  metFORMIN (GLUCOPHAGE) 1000 MG tablet TAKE 1 TABLET (1,000 MG TOTAL) BY MOUTH 2 (TWO) TIMES DAILY WITH A MEAL. 07/17/21  Yes Davione Lenker, Eilleen Kempf, MD  metoprolol succinate (TOPROL-XL) 25 MG 24 hr tablet TAKE 1 TABLET BY MOUTH EVERY DAY Patient taking differently: Take 25 mg by mouth daily. 03/26/21  Yes Little Ishikawa, MD  Armodafinil 200 MG TABS Take 200 mg by mouth daily. Patient not taking: Reported on 11/05/2021 10/12/21   Tomma Lightning, MD    Allergies  Allergen Reactions   Doxycycline Hives   Erythromycin Base Hives   Glipizide Shortness Of Breath    [facility]     Patient Active Problem List   Diagnosis Date  Noted   Chronic right shoulder pain 04/27/2021   Erectile dysfunction 04/26/2021   Lack of energy 04/26/2021   Body mass index (BMI) of 40.1-44.9 in adult [facility]) 07/17/2020   S/P arthroscopy of left shoulder 02/01/2020   Impingement syndrome of left shoulder 01/14/2020   Hypertension associated with diabetes (HCC) 09/27/2019   Morbid obesity (HCC) 09/27/2019   Dyslipidemia 09/09/2018   Environmental allergies 09/09/2018   BMI 39.0-39.9,adult 09/03/2017   Dyslipidemia associated with type 2 diabetes mellitus (HCC) 09/03/2017   Anemia 04/14/2017   Hyperlipidemia 05/02/2016   OSA (obstructive sleep apnea) 09/06/2015   Benign essential HTN 09/06/2015    Past Medical History:  Diagnosis Date   Allergy    Diabetes mellitus without complication (HCC)    Elevated cholesterol    Hernia    bi lateral hernia repair   History of kidney stones    Hypertension    Sleep apnea 2014   severe   Umbilical hernia without obstruction and without gangrene 09/03/2017    Past Surgical History:  Procedure Laterality Date   CARPAL TUNNEL RELEASE Right 2007   CARPAL TUNNEL RELEASE Left 10/28/2014   Procedure: LEFT CARPAL TUNNEL RELEASE;  Surgeon: Dairl Ponder, MD;  Location: [facility];  Service: Orthopedics;  Laterality: Left;   FOOT SURGERY Left    INGUINAL HERNIA REPAIR     times two, each side  KIDNEY STONE SURGERY     MASS EXCISION Left 10/28/2014   Procedure: LEFT WRIST VOLAR MASS EXCISION;  Surgeon: Dairl Ponder, MD;  Location: [facility];  Service: Orthopedics;  Laterality: Left;   UMBILICAL HERNIA REPAIR N/A 10/27/2017   Procedure: HERNIA REPAIR UMBILICAL ADULT;  Surgeon: Berna Bue, MD;  Location[facility] OR;  Service: General;  Laterality: N/A;    Social History   Socioeconomic History   Marital status: Married    Spouse name: JASHUN PUERTAS   Number of children: 2   Years of education: Associates   Highest education level: Not on file   Occupational History   Occupation: Merchandiser, retail    Comment: [employer]  Tobacco Use   Smoking status: Never   Smokeless tobacco: Never  Vaping Use   Vaping Use: Never used  Substance and Sexual Activity   Alcohol use: No    Alcohol/week: 0.0 standard drinks   Drug use: No   Sexual activity: Yes    Partners: Female    Birth control/protection: Condom  Other Topics Concern   Not on file  Social History Narrative   Lives with his wife and their 2 children.   He has 3 sons from a previous relationship that live independently.   Formerly in the [employer].   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   Social Determinants of Health   Financial Resource Strain: Not on file  Food Insecurity: Not on file  Transportation Needs: Not on file  Physical Activity: Not on file  Stress: Not on file  Social Connections: Not on file  Intimate Partner Violence: Not on file    Family History  Problem Relation Age of Onset   Diabetes Mother    Stroke Mother        4 strokes   Heart attack Mother    Heart disease Mother    Hypertension Father    Hypertension Sister    Heart disease Sister        pacemaker     Review of Systems  Constitutional: Negative.  Negative for chills and fever.  HENT: Negative.  Negative for congestion and sore throat.   Respiratory: Negative.  Negative for cough and shortness of breath.   Cardiovascular: Negative.  Negative for chest pain and palpitations.  Gastrointestinal: Negative.  Negative for abdominal pain, blood in stool, diarrhea, melena, nausea and vomiting.  Genitourinary: Negative.   Skin: Negative.  Negative for rash.  Neurological: Negative.  Negative for dizziness and headaches.  All other systems reviewed and are negative.  Vitals:   11/05/21 1331  BP: 124/82  Pulse: 96  SpO2: 97%   Wt Readings from Last 3 Encounters:  11/05/21 260 lb (117.9 kg)   10/11/21 262 lb 6.4 oz (119 kg)  08/22/21 261 lb 3.2 oz (118.5 kg)    Physical Exam Vitals reviewed.  Constitutional:      Appearance: He is obese.  HENT:     Head: Normocephalic.  Eyes:     Extraocular Movements: Extraocular movements intact.     Pupils: Pupils are equal, round, and reactive to light.  Cardiovascular:     Rate and Rhythm: Normal rate and regular rhythm.     Pulses: Normal pulses.     Heart sounds: Normal heart sounds.  Pulmonary:     Effort: Pulmonary effort is normal.     Breath sounds: Normal breath sounds.  Musculoskeletal:  Cervical back: No tenderness.  Skin:    General: Skin is warm and dry.     Capillary Refill: Capillary refill takes less than 2 seconds.  Neurological:     General: No focal deficit present.     Mental Status: He is alert and oriented to person, place, and time.  Psychiatric:        Mood and Affect: Mood normal.        Behavior: Behavior normal.    Results for orders placed or performed in visit on 11/05/21 (from the past 24 hour(s))  POCT glycosylated hemoglobin (Hb A1C)     Status: Abnormal   Collection Time: 11/05/21  1:50 PM  Result Value Ref Range   Hemoglobin A1C 9.4 (A) 4.0 - 5.6 %   HbA1c POC (<> result, manual entry)     HbA1c, POC (prediabetic range)     HbA1c, POC (controlled diabetic range)      ASSESSMENT & PLAN: A total of 45 minutes was spent with the patient and counseling/coordination of care regarding preparing for this visit, review of most recent office visit notes, uncontrolled diabetes and cardiovascular risks associated with this condition, review of all medications and changes made including addition of GLP-1 agonists, education on nutrition, review of most recent blood work results including today's hemoglobin A1c, prognosis, documentation, need for follow-up.  Problem List Items Addressed This Visit       Cardiovascular and Mediastinum   Hypertension associated with diabetes (HCC)     Well-controlled hypertension. Continue metoprolol succinate 25 mg and lisinopril 20 mg daily. Uncontrolled diabetes with hemoglobin A1c of 9.4. Continue metformin 1000 mg twice a day and start weekly Mounjaro 2.5 mg.  If tolerated after 4 weeks will increase to 5 mg. Diet and nutrition discussed.  Advised to decrease amount of daily carbohydrate intake. Follow-up in 3 months.      Relevant Medications   tirzepatide (MOUNJARO) 2.5 MG/0.5ML Pen   Other Relevant Orders   POCT glycosylated hemoglobin (Hb A1C) (Completed)   Other Visit Diagnoses     Uncontrolled type 2 diabetes mellitus with hyperglycemia (HCC)    -  Primary   Relevant Medications   tirzepatide (MOUNJARO) 2.5 MG/0.5ML Pen   Need for pneumococcal vaccination       Relevant Orders   Pneumococcal conjugate vaccine 20-valent (Prevnar 20) (Completed)      Patient Instructions   Diabetes Mellitus and Nutrition, Adult When you have diabetes, or diabetes mellitus, it is very important to have healthy eating habits because your blood sugar (glucose) levels are greatly affected by what you eat and drink. Eating healthy foods in the right amounts, at about the same times every day, can help you: Manage your blood glucose. Lower your risk of heart disease. Improve your blood pressure. Reach or maintain a healthy weight. What can affect my meal plan? Every person with diabetes is different, and each person has different needs for a meal plan. Your health care provider may recommend that you work with a dietitian to make a meal plan that is best for you. Your meal plan may vary depending on factors such as: The calories you need. The medicines you take. Your weight. Your blood glucose, blood pressure, and cholesterol levels. Your activity level. Other health conditions you have, such as heart or kidney disease. How do carbohydrates affect me? Carbohydrates, also called carbs, affect your blood glucose level more than any other  type of food. Eating carbs raises the amount of glucose in  your blood. It is important to know how many carbs you can safely have in each meal. This is different for every person. Your dietitian can help you calculate how many carbs you should have at each meal and for each snack. How does alcohol affect me? Alcohol can cause a decrease in blood glucose (hypoglycemia), especially if you use insulin or take certain diabetes medicines by mouth. Hypoglycemia can be a life-threatening condition. Symptoms of hypoglycemia, such as sleepiness, dizziness, and confusion, are similar to symptoms of having too much alcohol. Do not drink alcohol if: Your health care provider tells you not to drink. You are pregnant, may be pregnant, or are planning to become pregnant. If you drink alcohol: Limit how much you have to: 0-1 drink a day for women. 0-2 drinks a day for men. Know how much alcohol is in your drink. In the [place] one drink equals one 12 oz bottle of beer (355 mL), one 5 oz glass of wine (148 mL), or one 1 oz glass of hard liquor (44 mL). Keep yourself hydrated with water, diet soda, or unsweetened iced tea. Keep in mind that regular soda, juice, and other mixers may contain a lot of sugar and must be counted as carbs. What are tips for following this plan? Reading food labels Start by checking the serving size on the Nutrition Facts label of packaged foods and drinks. The number of calories and the amount of carbs, fats, and other nutrients listed on the label are based on one serving of the item. Many items contain more than one serving per package. Check the total grams (g) of carbs in one serving. Check the number of grams of saturated fats and trans fats in one serving. Choose foods that have a low amount or none of these fats. Check the number of milligrams (mg) of salt (sodium) in one serving. Most people should limit total sodium intake to less than 2,300 mg per day. Always check the nutrition  information of foods labeled as "low-fat" or "nonfat." These foods may be higher in added sugar or refined carbs and should be avoided. Talk to your dietitian to identify your daily goals for nutrients listed on the label. Shopping Avoid buying canned, pre-made, or processed foods. These foods tend to be high in fat, sodium, and added sugar. Shop around the outside edge of the grocery store. This is where you will most often find fresh fruits and vegetables, bulk grains, fresh meats, and fresh dairy products. Cooking Use low-heat cooking methods, such as baking, instead of high-heat cooking methods, such as deep frying. Cook using healthy oils, such as olive, canola, or sunflower oil. Avoid cooking with butter, cream, or high-fat meats. Meal planning Eat meals and snacks regularly, preferably at the same times every day. Avoid going long periods of time without eating. Eat foods that are high in fiber, such as fresh fruits, vegetables, beans, and whole grains. Eat 4-6 oz (112-168 g) of lean protein each day, such as lean meat, chicken, fish, eggs, or tofu. One ounce (oz) (28 g) of lean protein is equal to: 1 oz (28 g) of meat, chicken, or fish. 1 egg.  cup (62 g) of tofu. Eat some foods each day that contain healthy fats, such as avocado, nuts, seeds, and fish. What foods should I eat? Fruits Berries. Apples. Oranges. Peaches. Apricots. Plums. Grapes. Mangoes. Papayas. Pomegranates. Kiwi. Cherries. Vegetables Leafy greens, including lettuce, spinach, kale, chard, collard greens, mustard greens, and cabbage. Beets. Cauliflower. Broccoli. Carrots.  Green beans. Tomatoes. Peppers. Onions. Cucumbers. Brussels sprouts. Grains Whole grains, such as whole-wheat or whole-grain bread, crackers, tortillas, cereal, and pasta. Unsweetened oatmeal. Quinoa. Brown or wild rice. Meats and other proteins Seafood. Poultry without skin. Lean cuts of poultry and beef. Tofu. Nuts. Seeds. Dairy Low-fat or  fat-free dairy products such as milk, yogurt, and cheese. The items listed above may not be a complete list of foods and beverages you can eat and drink. Contact a dietitian for more information. What foods should I avoid? Fruits Fruits canned with syrup. Vegetables Canned vegetables. Frozen vegetables with butter or cream sauce. Grains Refined white flour and flour products such as bread, pasta, snack foods, and cereals. Avoid all processed foods. Meats and other proteins Fatty cuts of meat. Poultry with skin. Breaded or fried meats. Processed meat. Avoid saturated fats. Dairy Full-fat yogurt, cheese, or milk. Beverages Sweetened drinks, such as soda or iced tea. The items listed above may not be a complete list of foods and beverages you should avoid. Contact a dietitian for more information. Questions to ask a health care provider Do I need to meet with a certified diabetes care and education specialist? Do I need to meet with a dietitian? What number can I call if I have questions? When are the best times to check my blood glucose? Where to find more information: American Diabetes Association: diabetes.org Academy of Nutrition and Dietetics: eatright.[employer] of Diabetes and Digestive and Kidney Diseases: StageSync.si Association of Diabetes Care & Education Specialists: diabeteseducator.org Summary It is important to have healthy eating habits because your blood sugar (glucose) levels are greatly affected by what you eat and drink. It is important to use alcohol carefully. A healthy meal plan will help you manage your blood glucose and lower your risk of heart disease. Your health care provider may recommend that you work with a dietitian to make a meal plan that is best for you. This information is not intended to replace advice given to you by your health care provider. Make sure you discuss any questions you have with your health care provider. Document Revised:  07/05/2020 Document Reviewed: 07/05/2020 Elsevier Patient Education  2022 Elsevier Inc.    Edwina Barth, MD [facility]

## 2021-11-05 NOTE — Patient Instructions (Signed)

## 2021-11-07 DIAGNOSIS — Z6841 Body Mass Index (BMI) 40.0 and over, adult: Secondary | ICD-10-CM | POA: Diagnosis not present

## 2021-11-07 DIAGNOSIS — M1712 Unilateral primary osteoarthritis, left knee: Secondary | ICD-10-CM | POA: Diagnosis not present

## 2021-11-07 DIAGNOSIS — M25561 Pain in right knee: Secondary | ICD-10-CM | POA: Diagnosis not present

## 2021-11-07 DIAGNOSIS — M25562 Pain in left knee: Secondary | ICD-10-CM | POA: Diagnosis not present

## 2021-11-07 DIAGNOSIS — M1711 Unilateral primary osteoarthritis, right knee: Secondary | ICD-10-CM | POA: Diagnosis not present

## 2021-11-07 DIAGNOSIS — M170 Bilateral primary osteoarthritis of knee: Secondary | ICD-10-CM | POA: Diagnosis not present

## 2021-11-16 ENCOUNTER — Other Ambulatory Visit: Payer: Self-pay | Admitting: Emergency Medicine

## 2021-11-16 DIAGNOSIS — E1159 Type 2 diabetes mellitus with other circulatory complications: Secondary | ICD-10-CM

## 2021-11-16 DIAGNOSIS — I152 Hypertension secondary to endocrine disorders: Secondary | ICD-10-CM

## 2021-11-21 DIAGNOSIS — M1712 Unilateral primary osteoarthritis, left knee: Secondary | ICD-10-CM | POA: Diagnosis not present

## 2021-11-21 DIAGNOSIS — R600 Localized edema: Secondary | ICD-10-CM | POA: Diagnosis not present

## 2021-11-21 DIAGNOSIS — M23322 Other meniscus derangements, posterior horn of medial meniscus, left knee: Secondary | ICD-10-CM | POA: Diagnosis not present

## 2021-11-21 DIAGNOSIS — M25562 Pain in left knee: Secondary | ICD-10-CM | POA: Diagnosis not present

## 2021-11-28 ENCOUNTER — Other Ambulatory Visit: Payer: Self-pay | Admitting: Emergency Medicine

## 2021-11-28 DIAGNOSIS — S83242A Other tear of medial meniscus, current injury, left knee, initial encounter: Secondary | ICD-10-CM | POA: Diagnosis not present

## 2021-11-28 DIAGNOSIS — M222X2 Patellofemoral disorders, left knee: Secondary | ICD-10-CM | POA: Diagnosis not present

## 2021-12-12 DIAGNOSIS — M25662 Stiffness of left knee, not elsewhere classified: Secondary | ICD-10-CM | POA: Diagnosis not present

## 2021-12-12 DIAGNOSIS — M25562 Pain in left knee: Secondary | ICD-10-CM | POA: Diagnosis not present

## 2021-12-12 DIAGNOSIS — M25362 Other instability, left knee: Secondary | ICD-10-CM | POA: Diagnosis not present

## 2021-12-19 DIAGNOSIS — M25662 Stiffness of left knee, not elsewhere classified: Secondary | ICD-10-CM | POA: Diagnosis not present

## 2021-12-19 DIAGNOSIS — M25362 Other instability, left knee: Secondary | ICD-10-CM | POA: Diagnosis not present

## 2021-12-19 DIAGNOSIS — M25562 Pain in left knee: Secondary | ICD-10-CM | POA: Diagnosis not present

## 2021-12-26 DIAGNOSIS — M25562 Pain in left knee: Secondary | ICD-10-CM | POA: Diagnosis not present

## 2021-12-26 DIAGNOSIS — M25662 Stiffness of left knee, not elsewhere classified: Secondary | ICD-10-CM | POA: Diagnosis not present

## 2021-12-26 DIAGNOSIS — M25362 Other instability, left knee: Secondary | ICD-10-CM | POA: Diagnosis not present

## 2021-12-31 DIAGNOSIS — M25362 Other instability, left knee: Secondary | ICD-10-CM | POA: Diagnosis not present

## 2021-12-31 DIAGNOSIS — M25562 Pain in left knee: Secondary | ICD-10-CM | POA: Diagnosis not present

## 2021-12-31 DIAGNOSIS — M25662 Stiffness of left knee, not elsewhere classified: Secondary | ICD-10-CM | POA: Diagnosis not present

## 2022-01-02 DIAGNOSIS — M25662 Stiffness of left knee, not elsewhere classified: Secondary | ICD-10-CM | POA: Diagnosis not present

## 2022-01-02 DIAGNOSIS — M25362 Other instability, left knee: Secondary | ICD-10-CM | POA: Diagnosis not present

## 2022-01-02 DIAGNOSIS — G4733 Obstructive sleep apnea (adult) (pediatric): Secondary | ICD-10-CM | POA: Diagnosis not present

## 2022-01-02 DIAGNOSIS — M25562 Pain in left knee: Secondary | ICD-10-CM | POA: Diagnosis not present

## 2022-01-03 ENCOUNTER — Other Ambulatory Visit: Payer: Self-pay

## 2022-01-03 ENCOUNTER — Ambulatory Visit: Payer: BC Managed Care – PPO | Admitting: Pulmonary Disease

## 2022-01-03 ENCOUNTER — Encounter: Payer: Self-pay | Admitting: Pulmonary Disease

## 2022-01-03 VITALS — BP 116/74 | HR 90 | Temp 98.0°F | Ht 67.0 in | Wt 254.0 lb

## 2022-01-03 DIAGNOSIS — G4719 Other hypersomnia: Secondary | ICD-10-CM | POA: Diagnosis not present

## 2022-01-03 DIAGNOSIS — Z9989 Dependence on other enabling machines and devices: Secondary | ICD-10-CM | POA: Diagnosis not present

## 2022-01-03 DIAGNOSIS — G4733 Obstructive sleep apnea (adult) (pediatric): Secondary | ICD-10-CM | POA: Diagnosis not present

## 2022-01-03 MED ORDER — ARMODAFINIL 50 MG PO TABS: 100.0000 mg | ORAL_TABLET | Freq: Every day | ORAL | 0 refills | Status: DC

## 2022-01-03 NOTE — Progress Notes (Signed)
Walter Reynolds    JY:[id number]    31-Dec-1970  Primary Care Physician:Sagardia, Ines Bloomer, MD  Referring Physician: Horald Pollen, [place],  [place] [address]  Chief complaint:   Patient with a history of obstructive sleep apnea diagnosed about 6 years ago Had a recent repeat sleep study showing severe obstructive sleep apnea, moderate oxygen desaturations Using CPAP regularly  HPI:  Sleep apnea diagnosed about 6 years ago Repeat study did reveal severe obstructive sleep apnea Has been using CPAP  He does have residual excessive daytime sleepiness and was tried on armodafinil 200 mg of armodafinil was causing palpitations and he decided to stop the medications He noted that his daytime sleepiness was much better with use of the modafinil but could not tolerate the palpitations We did discuss the side effect profile of all stimulants used with daytime sleepiness, Wakix is the only one that does not have palpitations listed as a side effect-but not yet approved for residual sleepiness and OSA that adequately treated he continues to use his CPAP on a nightly basis and feels better  He has started working out regularly and has managed to lose about 10 pounds recently  Usually goes to bed between 9 and 11 PM Falls asleep easily About 1-2 awakenings Final wake up time between 6 and 7 AM  Weight has remained stable about 5 to 10 pounds up the last couple years  He does not know his current CPAP settings  Does have a history of hypertension, diabetes, hypercholesterolemia  Does not smoke Does not use alcohol  Admits to dry mouth in the mornings No morning headaches Denies excessive sweating at night  Dad has obstructive sleep apnea  Outpatient Encounter Medications as of 01/03/2022  Medication Sig   aspirin EC 81 MG EC tablet Take 1 tablet (81 mg total) by mouth daily.   atorvastatin (LIPITOR) 40 MG tablet Take 1 tablet (40 mg  total) by mouth daily.   fluticasone (FLONASE) 50 MCG/ACT nasal spray SPRAY 2 SPRAYS INTO EACH NOSTRIL EVERY DAY (Patient taking differently: Place 2 sprays into both nostrils daily.)   ibuprofen (ADVIL) 600 MG tablet Take 1 tablet (600 mg total) by mouth every 6 (six) hours as needed.   lisinopril (ZESTRIL) 20 MG tablet TAKE 1 TABLET BY MOUTH EVERY DAY   metFORMIN (GLUCOPHAGE) 1000 MG tablet TAKE 1 TABLET (1,000 MG TOTAL) BY MOUTH 2 (TWO) TIMES DAILY WITH A MEAL.   metoprolol succinate (TOPROL-XL) 25 MG 24 hr tablet TAKE 1 TABLET BY MOUTH EVERY DAY (Patient taking differently: Take 25 mg by mouth daily.)   [DISCONTINUED] Armodafinil 200 MG TABS Take 200 mg by mouth daily. (Patient not taking: Reported on 11/05/2021)   No facility-administered encounter medications on file as of 01/03/2022.    Allergies as of 01/03/2022 - Review Complete 01/03/2022  Allergen Reaction Noted   Doxycycline Hives 06/01/2015   Erythromycin base Hives 10/16/2012   Glipizide Shortness Of Breath 07/17/2020   Armodafinil Palpitations 01/03/2022    Past Medical History:  Diagnosis Date   Allergy    Diabetes mellitus without complication (HCC)    Elevated cholesterol    Hernia    bi lateral hernia repair   History of kidney stones    Hypertension    Sleep apnea [id number]   severe   Umbilical hernia without obstruction and without gangrene 09/03/2017    Past Surgical History:  Procedure Laterality Date   CARPAL  TUNNEL RELEASE Right 2007   CARPAL TUNNEL RELEASE Left 10/28/2014   Procedure: LEFT CARPAL TUNNEL RELEASE;  Surgeon: Charlotte Crumb, MD;  Location: [place];  Service: Orthopedics;  Laterality: Left;   FOOT SURGERY Left    INGUINAL HERNIA REPAIR     times two, each side   KIDNEY STONE SURGERY     MASS EXCISION Left 10/28/2014   Procedure: LEFT WRIST VOLAR MASS EXCISION;  Surgeon: Charlotte Crumb, MD;  Location: [facility];  Service: Orthopedics;  Laterality: Left;    UMBILICAL HERNIA REPAIR N/A 10/27/2017   Procedure: HERNIA REPAIR UMBILICAL ADULT;  Surgeon: Clovis Riley, MD;  Location[facility];  Service: General;  Laterality: N/A;    Family History  Problem Relation Age of Onset   Diabetes Mother    Stroke Mother        4 strokes   Heart attack Mother    Heart disease Mother    Hypertension Father    Hypertension Sister    Heart disease Sister        pacemaker    Social History   Socioeconomic History   Marital status: Married    Spouse name: KEISHON SCHIFFNER   Number of children: 2   Years of education: Associates   Highest education level: Not on file  Occupational History   Occupation: Librarian, academic    Comment: [employer]  Tobacco Use   Smoking status: Never   Smokeless tobacco: Never  Vaping Use   Vaping Use: Never used  Substance and Sexual Activity   Alcohol use: No    Alcohol/week: 0.0 standard drinks   Drug use: No   Sexual activity: Yes    Partners: Female    Birth control/protection: Condom  Other Topics Concern   Not on file  Social History Narrative   Lives with his wife and their 2 children.   He has 3 sons from a previous relationship that live independently.   Formerly in the [employer].   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   Social Determinants of Health   Financial Resource Strain: Not on file  Food Insecurity: Not on file  Transportation Needs: Not on file  Physical Activity: Not on file  Stress: Not on file  Social Connections: Not on file  Intimate Partner Violence: Not on file    Review of Systems  Constitutional:  Positive for fatigue.  HENT: Negative.    Respiratory:  Positive for apnea.   Cardiovascular: Negative.   Psychiatric/Behavioral:  Positive for sleep disturbance.   All other systems reviewed and are negative.  There were no vitals filed for this visit.    Physical Exam Constitutional:       Appearance: He is obese.  HENT:     Head: Normocephalic.     Mouth/Throat:     Mouth: Mucous membranes are moist.     Pharynx: No oropharyngeal exudate or posterior oropharyngeal erythema.     Comments: Macroglossia, crowded oropharynx, Mallampati 4 Eyes:     General:        Right eye: No discharge.        Left eye: No discharge.     Pupils: Pupils are equal, round, and reactive to light.  Cardiovascular:     Rate and Rhythm: Normal rate and regular rhythm.     Pulses: Normal pulses.     Heart sounds: Normal heart sounds. No  murmur heard.   No friction rub.  Pulmonary:     Effort: Pulmonary effort is normal. No respiratory distress.     Breath sounds: Normal breath sounds. No stridor. No wheezing or rhonchi.  Musculoskeletal:     Cervical back: No rigidity or tenderness.  Neurological:     Mental Status: He is alert.  Psychiatric:        Mood and Affect: Mood normal.   Previous study not available for review Results of the Epworth flowsheet 09/20/2020  Sitting and reading 0  Watching TV 0  Sitting, inactive in a public place (e.g. a theatre or a meeting) 0  As a passenger in a car for an hour without a break 3  Lying down to rest in the afternoon when circumstances permit 0  Sitting and talking to someone 0  Sitting quietly after a lunch without alcohol 0  In a car, while stopped for a few minutes in traffic 1  Total score 4   Data Reviewed: Recent echo with ejection fraction of 45 to 50% with global hypokinesis on the left, diastolic dysfunction normal right ventricular function.  Compliance data reveals 100% compliance with CPAP Set between 5 and 15 95 percentile pressure of 10.5 AHI of 1.3  Assessment:  History of obstructive sleep apnea -Severe obstructive sleep apnea adequately treated with CPAP therapy -Did not have significant desaturations on overnight oximetry that was performed -Continues to tolerate CPAP well with improvement in symptoms -He does  have residual excessive daytime sleepiness and daytime fatigue with CPAP use  Excessive daytime sleepiness with daytime fatigue -Despite use of CPAP on a regular basis -Armodafinil does help but causes palpitations -Options of treatment were discussed today  Pathophysiology of sleep disordered breathing reviewed Treatment options discussed  Importance of weight management discussed  Regular exercises and diet encouraged -He has started to go to the gym on a regular basis and is managing to lose weight -Has also been watching his diet closely  Plan/Recommendations:  Continue nightly CPAP use  Continue weight loss efforts and regular exercises  We agreed on trying armodafinil at a lower dose of 100, may cut down to 50 depending on effect of the medication He is aware to discontinue the medication if it continues to contribute to significant palpitations that he is not able to tolerate  I spent 30 minutes dedicated to the care of this patient on the date of this encounter to include previsit review of records, face-to-face time with the patient discussing conditions above, post visit ordering of testing, clinical documentation with electronic health record, making appropriate referrals as documented, and communicated necessary findings to members of the patient's care team  Sherrilyn Rist MD [facility] 01/03/2022, 9:12 AM  CC: Horald Pollen, *

## 2022-01-03 NOTE — Patient Instructions (Signed)
I will see you in about 3 months  We will try armodafinil at a lower dose at about 100, may cut down to 50 if that is what is tolerated that is helping daytime sleepiness but not giving you palpitations  Continue using CPAP on a nightly basis  Call us with significant concerns

## 2022-01-14 DIAGNOSIS — M25362 Other instability, left knee: Secondary | ICD-10-CM | POA: Diagnosis not present

## 2022-01-14 DIAGNOSIS — M25662 Stiffness of left knee, not elsewhere classified: Secondary | ICD-10-CM | POA: Diagnosis not present

## 2022-01-14 DIAGNOSIS — M25562 Pain in left knee: Secondary | ICD-10-CM | POA: Diagnosis not present

## 2022-02-02 DIAGNOSIS — G4733 Obstructive sleep apnea (adult) (pediatric): Secondary | ICD-10-CM | POA: Diagnosis not present

## 2022-02-17 ENCOUNTER — Other Ambulatory Visit: Payer: Self-pay | Admitting: Pulmonary Disease

## 2022-02-21 ENCOUNTER — Other Ambulatory Visit: Payer: Self-pay | Admitting: Emergency Medicine

## 2022-02-21 DIAGNOSIS — E1159 Type 2 diabetes mellitus with other circulatory complications: Secondary | ICD-10-CM

## 2022-03-02 DIAGNOSIS — G4733 Obstructive sleep apnea (adult) (pediatric): Secondary | ICD-10-CM | POA: Diagnosis not present

## 2022-03-04 DIAGNOSIS — E1169 Type 2 diabetes mellitus with other specified complication: Secondary | ICD-10-CM | POA: Diagnosis not present

## 2022-03-04 DIAGNOSIS — E1165 Type 2 diabetes mellitus with hyperglycemia: Secondary | ICD-10-CM | POA: Diagnosis not present

## 2022-03-04 DIAGNOSIS — G4733 Obstructive sleep apnea (adult) (pediatric): Secondary | ICD-10-CM | POA: Diagnosis not present

## 2022-03-04 DIAGNOSIS — Z23 Encounter for immunization: Secondary | ICD-10-CM | POA: Diagnosis not present

## 2022-03-04 DIAGNOSIS — I152 Hypertension secondary to endocrine disorders: Secondary | ICD-10-CM | POA: Diagnosis not present

## 2022-03-04 DIAGNOSIS — E1159 Type 2 diabetes mellitus with other circulatory complications: Secondary | ICD-10-CM | POA: Diagnosis not present

## 2022-03-13 ENCOUNTER — Other Ambulatory Visit: Payer: Self-pay | Admitting: Emergency Medicine

## 2022-04-01 ENCOUNTER — Ambulatory Visit: Payer: BC Managed Care – PPO | Admitting: Pulmonary Disease

## 2022-04-01 ENCOUNTER — Encounter: Payer: Self-pay | Admitting: Pulmonary Disease

## 2022-04-01 VITALS — BP 128/82 | HR 78 | Temp 97.9°F | Ht 67.0 in | Wt 246.0 lb

## 2022-04-01 DIAGNOSIS — Z9989 Dependence on other enabling machines and devices: Secondary | ICD-10-CM

## 2022-04-01 DIAGNOSIS — G4733 Obstructive sleep apnea (adult) (pediatric): Secondary | ICD-10-CM | POA: Diagnosis not present

## 2022-04-01 MED ORDER — ARMODAFINIL 50 MG PO TABS: 50.0000 mg | ORAL_TABLET | Freq: Every day | ORAL | 0 refills | Status: DC

## 2022-04-01 NOTE — Progress Notes (Signed)
? ?      ?Walter Malessaiah Driskill Jr.    [id number]    [birth year] ? ?Primary Care Physician:Walter Reynolds ? ?Referring Physician: [name], [name], Reynolds ?[address] ?[place],  [place] [id number] ? ?Chief complaint:   ?History of obstructive sleep apnea ?Recent sleep study showing severe obstructive sleep apnea ?In for follow-up ? ?HPI: ? ?Diagnosed with sleep apnea about 6 years ago ?Recent study shows severe obstructive sleep apnea ? ?Continues to use the CPAP almost nightly ? ?Does have some residual daytime sleepiness ?armodafinil was tried, high doses did cause palpitations ?Appears to be tolerating lower doses ? ?Use of caffeine pills or coffee, currently drinks black tea ?Other medications for excessive daytime sleepiness despite adequate treatment of sleep apnea also discussed ? ?About 20 pound weight loss recently ? ?Usually goes to bed between 9 and 11 PM ?Falls asleep easily ?About 1-2 awakenings ?Final wake up time between 6 and 7 AM ? ?Weight has remained stable about 5 to 10 pounds up the last couple years ? ?He does not know his current CPAP settings ? ?Does have a history of hypertension, diabetes, hypercholesterolemia ? ?Does not smoke ?Does not use alcohol ? ?Admits to dry mouth in the mornings ?No morning headaches ?Denies excessive sweating at night ? ?Dad has obstructive sleep apnea ? ?Outpatient Encounter Medications as of 04/01/2022  ?Medication Sig  ? Armodafinil 50 MG tablet TAKE 2 TABLETS BY MOUTH EVERY DAY  ? aspirin EC 81 MG EC tablet Take 1 tablet (81 mg total) by mouth daily.  ? fluticasone (FLONASE) 50 MCG/ACT nasal spray SPRAY 2 SPRAYS INTO EACH NOSTRIL EVERY DAY (Patient taking differently: Place 2 sprays into both nostrils daily.)  ? ibuprofen (ADVIL) 600 MG tablet Take 1 tablet (600 mg total) by mouth every 6 (six) hours as needed.  ? lisinopril (ZESTRIL) 20 MG tablet TAKE 1 TABLET BY MOUTH EVERY DAY  ? metFORMIN (GLUCOPHAGE) 1000 MG tablet TAKE 1 TABLET (1,000 MG  TOTAL) BY MOUTH 2 (TWO) TIMES DAILY WITH A MEAL.  ? metoprolol succinate (TOPROL-XL) 25 MG 24 hr tablet TAKE 1 TABLET BY MOUTH EVERY DAY  ? atorvastatin (LIPITOR) 40 MG tablet Take 1 tablet (40 mg total) by mouth daily.  ? [DISCONTINUED] MOUNJARO 2.5 MG/0.5ML Pen SMARTSIG:2.5 Milligram(s) SUB-Q Once a Week (Patient not taking: Reported on 04/01/2022)  ? ?No facility-administered encounter medications on file as of 04/01/2022.  ? ? ?Allergies as of 04/01/2022 - Review Complete 04/01/2022  ?Allergen Reaction Noted  ? Doxycycline Hives 06/01/2015  ? Erythromycin base Hives 10/16/2012  ? Glipizide Shortness Of Breath 07/17/2020  ? Armodafinil Palpitations 01/03/2022  ? ? ?Past Medical History:  ?Diagnosis Date  ? Allergy   ? Diabetes mellitus without complication (HCC)   ? Elevated cholesterol   ? Hernia   ? bi lateral hernia repair  ? History of kidney stones   ? Hypertension   ? Sleep apnea 2014  ? severe  ? Umbilical hernia without obstruction and without gangrene 09/03/2017  ? ? ?Past Surgical History:  ?Procedure Laterality Date  ? CARPAL TUNNEL RELEASE Right 2007  ? CARPAL TUNNEL RELEASE Left 10/28/2014  ? Procedure: LEFT CARPAL TUNNEL RELEASE;  Surgeon: Dairl PonderMatthew Weingold, Reynolds;  Location: [facility];  Service: Orthopedics;  Laterality: Left;  ? FOOT SURGERY Left   ? INGUINAL HERNIA REPAIR    ? times two, each side  ? KIDNEY STONE SURGERY    ? MASS EXCISION Left 10/28/2014  ?  Procedure: LEFT WRIST VOLAR MASS EXCISION;  Surgeon: Dairl Ponder, Reynolds;  Location: [facility];  Service: Orthopedics;  Laterality: Left;  ? UMBILICAL HERNIA REPAIR N/A 10/27/2017  ? Procedure: HERNIA REPAIR UMBILICAL ADULT;  Surgeon: Berna Bue, Reynolds;  Location: [facility] OR;  Service: General;  Laterality: N/A;  ? ? ?Family History  ?Problem Relation Age of Onset  ? Diabetes Mother   ? Stroke Mother   ?     4 strokes  ? Heart attack Mother   ? Heart disease Mother   ? Hypertension Father   ? Hypertension Sister   ?  Heart disease Sister   ?     pacemaker  ? ? ?Social History  ? ?Socioeconomic History  ? Marital status: Married  ?  Spouse name: Walter Reynolds  ? Number of children: 2  ? Years of education: Associates  ? Highest education level: Not on file  ?Occupational History  ? Occupation: Merchandiser, retail  ?  Comment: [employer]  ?Tobacco Use  ? Smoking status: Never  ? Smokeless tobacco: Never  ?Vaping Use  ? Vaping Use: Never used  ?Substance and Sexual Activity  ? Alcohol use: No  ?  Alcohol/week: 0.0 standard drinks  ? Drug use: No  ? Sexual activity: Yes  ?  Partners: Female  ?  Birth control/protection: Condom  ?Other Topics Concern  ? Not on file  ?Social History Narrative  ? Lives with his wife and their 2 children.  ? He has 3 sons from a previous relationship that live independently.  ? Formerly in the [employer].  ? 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  ? ?Social Determinants of Health  ? ?Financial Resource Strain: Not on file  ?Food Insecurity: Not on file  ?Transportation Needs: Not on file  ?Physical Activity: Not on file  ?Stress: Not on file  ?Social Connections: Not on file  ?Intimate Partner Violence: Not on file  ? ? ?Review of Systems  ?Constitutional:  Positive for fatigue.  ?HENT: Negative.    ?Respiratory:  Positive for apnea.   ?Cardiovascular: Negative.   ?Psychiatric/Behavioral:  Positive for sleep disturbance.   ?All other systems reviewed and are negative. ? ?Vitals:  ? 04/01/22 0854  ?BP: 128/82  ?Pulse: 78  ?Temp: 97.9 ?F (36.6 ?C)  ?SpO2: 96%  ? ? ? ? ?Physical Exam ?Constitutional:   ?   Appearance: He is obese.  ?HENT:  ?   Head: Normocephalic.  ?   Mouth/Throat:  ?   Mouth: Mucous membranes are moist.  ?   Pharynx: No oropharyngeal exudate or posterior oropharyngeal erythema.  ?   Comments: Macroglossia, crowded oropharynx, Mallampati 4 ?Eyes:  ?   General:     ?   Right eye: No discharge.     ?   Left eye:  No discharge.  ?   Pupils: Pupils are equal, round, and reactive to light.  ?Cardiovascular:  ?   Rate and Rhythm: Normal rate and regular rhythm.  ?   Pulses: Normal pulses.  ?   Heart sounds: Normal heart sounds. No murmur heard. ?  No friction rub.  ?Pulmonary:  ?   Effort: Pulmonary effort is normal. No respiratory distress.  ?   Breath sounds: Normal breath sounds. No stridor. No wheezing or rhonchi.  ?Musculoskeletal:  ?   Cervical back: No rigidity or tenderness.  ?Neurological:  ?   Mental  Status: He is alert.  ?Psychiatric:     ?   Mood and Affect: Mood normal.  ? ?Previous study not available for review ? ?  09/20/2020  ? 10:10 AM  ?Results of the Epworth flowsheet  ?Sitting and reading 0  ?Watching TV 0  ?Sitting, inactive in a public place (e.g. a theatre or a meeting) 0  ?As a passenger in a car for an hour without a break 3  ?Lying down to rest in the afternoon when circumstances permit 0  ?Sitting and talking to someone 0  ?Sitting quietly after a lunch without alcohol 0  ?In a car, while stopped for a few minutes in traffic 1  ?Total score 4  ? ?Data Reviewed: ?Recent echo with ejection fraction of 45 to 50% with global hypokinesis on the left, diastolic dysfunction normal right ventricular function. ? ?Compliance data reveals 94% compliance ?Average use of 5 hours 45 minutes ?AutoSet of 5-15 ?Residual AHI 1.3 with 95 percentile pressure of 10 ? ?Assessment:  ? ?History of obstructive sleep apnea ?Severe obstructive sleep apnea, adequately treated with CPAP therapy ?Excessive daytime sleepiness despite optimal treatment of sleep disordered breathing ?-Encouraged to continue using CPAP on a nightly basis ?-Encouraged to try and get about 6 to 8 hours of sleep nightly ? ?Excessive daytime sleepiness with daytime fatigue ?-Use of stimulants including armodafinil ?-Caffeinated beverages may help ? ?Treatment options for sleep apnea discussed ?Treatment options for daytime sleepiness discussed ?Importance  of keeping his weight in check discussed ? ?Regular exercises and diet encouraged ? ?Plan/Recommendations: ? ?Continue nightly CPAP ? ?Continue weight loss efforts ? ?Armodafinil renewed ? ?Prescription f

## 2022-04-01 NOTE — Patient Instructions (Signed)
I will see you in about 6 months ? ?Continue using CPAP nightly ? ?Prescription to DME for CPAP supplies ?I will refill your Nuvigil ? ?Call with significant concerns ? ? ?

## 2022-04-02 DIAGNOSIS — G4733 Obstructive sleep apnea (adult) (pediatric): Secondary | ICD-10-CM | POA: Diagnosis not present

## 2022-04-04 DIAGNOSIS — M1712 Unilateral primary osteoarthritis, left knee: Secondary | ICD-10-CM | POA: Diagnosis not present

## 2022-04-04 DIAGNOSIS — M1711 Unilateral primary osteoarthritis, right knee: Secondary | ICD-10-CM | POA: Diagnosis not present

## 2022-04-08 DIAGNOSIS — M25561 Pain in right knee: Secondary | ICD-10-CM | POA: Diagnosis not present

## 2022-04-09 DIAGNOSIS — M9903 Segmental and somatic dysfunction of lumbar region: Secondary | ICD-10-CM | POA: Diagnosis not present

## 2022-04-09 DIAGNOSIS — M5416 Radiculopathy, lumbar region: Secondary | ICD-10-CM | POA: Diagnosis not present

## 2022-04-09 DIAGNOSIS — M9902 Segmental and somatic dysfunction of thoracic region: Secondary | ICD-10-CM | POA: Diagnosis not present

## 2022-04-09 DIAGNOSIS — M9904 Segmental and somatic dysfunction of sacral region: Secondary | ICD-10-CM | POA: Diagnosis not present

## 2022-04-10 DIAGNOSIS — M9903 Segmental and somatic dysfunction of lumbar region: Secondary | ICD-10-CM | POA: Diagnosis not present

## 2022-04-10 DIAGNOSIS — M9902 Segmental and somatic dysfunction of thoracic region: Secondary | ICD-10-CM | POA: Diagnosis not present

## 2022-04-10 DIAGNOSIS — M5416 Radiculopathy, lumbar region: Secondary | ICD-10-CM | POA: Diagnosis not present

## 2022-04-10 DIAGNOSIS — M9904 Segmental and somatic dysfunction of sacral region: Secondary | ICD-10-CM | POA: Diagnosis not present

## 2022-04-11 DIAGNOSIS — S83242A Other tear of medial meniscus, current injury, left knee, initial encounter: Secondary | ICD-10-CM | POA: Diagnosis not present

## 2022-04-11 DIAGNOSIS — S83241A Other tear of medial meniscus, current injury, right knee, initial encounter: Secondary | ICD-10-CM | POA: Diagnosis not present

## 2022-04-12 DIAGNOSIS — M9903 Segmental and somatic dysfunction of lumbar region: Secondary | ICD-10-CM | POA: Diagnosis not present

## 2022-04-12 DIAGNOSIS — M9904 Segmental and somatic dysfunction of sacral region: Secondary | ICD-10-CM | POA: Diagnosis not present

## 2022-04-12 DIAGNOSIS — M5416 Radiculopathy, lumbar region: Secondary | ICD-10-CM | POA: Diagnosis not present

## 2022-04-12 DIAGNOSIS — M9902 Segmental and somatic dysfunction of thoracic region: Secondary | ICD-10-CM | POA: Diagnosis not present

## 2022-04-17 DIAGNOSIS — M9903 Segmental and somatic dysfunction of lumbar region: Secondary | ICD-10-CM | POA: Diagnosis not present

## 2022-04-17 DIAGNOSIS — M5416 Radiculopathy, lumbar region: Secondary | ICD-10-CM | POA: Diagnosis not present

## 2022-04-17 DIAGNOSIS — M9902 Segmental and somatic dysfunction of thoracic region: Secondary | ICD-10-CM | POA: Diagnosis not present

## 2022-04-17 DIAGNOSIS — M9904 Segmental and somatic dysfunction of sacral region: Secondary | ICD-10-CM | POA: Diagnosis not present

## 2022-04-19 DIAGNOSIS — M9904 Segmental and somatic dysfunction of sacral region: Secondary | ICD-10-CM | POA: Diagnosis not present

## 2022-04-19 DIAGNOSIS — M9902 Segmental and somatic dysfunction of thoracic region: Secondary | ICD-10-CM | POA: Diagnosis not present

## 2022-04-19 DIAGNOSIS — M9903 Segmental and somatic dysfunction of lumbar region: Secondary | ICD-10-CM | POA: Diagnosis not present

## 2022-04-19 DIAGNOSIS — M5416 Radiculopathy, lumbar region: Secondary | ICD-10-CM | POA: Diagnosis not present

## 2022-04-23 DIAGNOSIS — M9903 Segmental and somatic dysfunction of lumbar region: Secondary | ICD-10-CM | POA: Diagnosis not present

## 2022-04-23 DIAGNOSIS — M9904 Segmental and somatic dysfunction of sacral region: Secondary | ICD-10-CM | POA: Diagnosis not present

## 2022-04-23 DIAGNOSIS — M9902 Segmental and somatic dysfunction of thoracic region: Secondary | ICD-10-CM | POA: Diagnosis not present

## 2022-04-23 DIAGNOSIS — M5416 Radiculopathy, lumbar region: Secondary | ICD-10-CM | POA: Diagnosis not present

## 2022-04-25 DIAGNOSIS — M9904 Segmental and somatic dysfunction of sacral region: Secondary | ICD-10-CM | POA: Diagnosis not present

## 2022-04-25 DIAGNOSIS — M9903 Segmental and somatic dysfunction of lumbar region: Secondary | ICD-10-CM | POA: Diagnosis not present

## 2022-04-25 DIAGNOSIS — M9902 Segmental and somatic dysfunction of thoracic region: Secondary | ICD-10-CM | POA: Diagnosis not present

## 2022-04-25 DIAGNOSIS — M5416 Radiculopathy, lumbar region: Secondary | ICD-10-CM | POA: Diagnosis not present

## 2022-04-26 DIAGNOSIS — M9904 Segmental and somatic dysfunction of sacral region: Secondary | ICD-10-CM | POA: Diagnosis not present

## 2022-04-26 DIAGNOSIS — M5416 Radiculopathy, lumbar region: Secondary | ICD-10-CM | POA: Diagnosis not present

## 2022-04-26 DIAGNOSIS — M9902 Segmental and somatic dysfunction of thoracic region: Secondary | ICD-10-CM | POA: Diagnosis not present

## 2022-04-26 DIAGNOSIS — M9903 Segmental and somatic dysfunction of lumbar region: Secondary | ICD-10-CM | POA: Diagnosis not present

## 2022-04-30 DIAGNOSIS — M9904 Segmental and somatic dysfunction of sacral region: Secondary | ICD-10-CM | POA: Diagnosis not present

## 2022-04-30 DIAGNOSIS — M5416 Radiculopathy, lumbar region: Secondary | ICD-10-CM | POA: Diagnosis not present

## 2022-04-30 DIAGNOSIS — M9903 Segmental and somatic dysfunction of lumbar region: Secondary | ICD-10-CM | POA: Diagnosis not present

## 2022-04-30 DIAGNOSIS — M9902 Segmental and somatic dysfunction of thoracic region: Secondary | ICD-10-CM | POA: Diagnosis not present

## 2022-05-02 DIAGNOSIS — M9903 Segmental and somatic dysfunction of lumbar region: Secondary | ICD-10-CM | POA: Diagnosis not present

## 2022-05-02 DIAGNOSIS — M5416 Radiculopathy, lumbar region: Secondary | ICD-10-CM | POA: Diagnosis not present

## 2022-05-02 DIAGNOSIS — M9902 Segmental and somatic dysfunction of thoracic region: Secondary | ICD-10-CM | POA: Diagnosis not present

## 2022-05-02 DIAGNOSIS — M9904 Segmental and somatic dysfunction of sacral region: Secondary | ICD-10-CM | POA: Diagnosis not present

## 2022-05-04 ENCOUNTER — Other Ambulatory Visit: Payer: Self-pay | Admitting: Emergency Medicine

## 2022-05-04 DIAGNOSIS — E1159 Type 2 diabetes mellitus with other circulatory complications: Secondary | ICD-10-CM

## 2022-05-07 DIAGNOSIS — M9903 Segmental and somatic dysfunction of lumbar region: Secondary | ICD-10-CM | POA: Diagnosis not present

## 2022-05-07 DIAGNOSIS — M9902 Segmental and somatic dysfunction of thoracic region: Secondary | ICD-10-CM | POA: Diagnosis not present

## 2022-05-07 DIAGNOSIS — M5416 Radiculopathy, lumbar region: Secondary | ICD-10-CM | POA: Diagnosis not present

## 2022-05-07 DIAGNOSIS — M9904 Segmental and somatic dysfunction of sacral region: Secondary | ICD-10-CM | POA: Diagnosis not present

## 2022-05-10 DIAGNOSIS — X58XXXA Exposure to other specified factors, initial encounter: Secondary | ICD-10-CM | POA: Diagnosis not present

## 2022-05-10 DIAGNOSIS — M23304 Other meniscus derangements, unspecified medial meniscus, left knee: Secondary | ICD-10-CM | POA: Diagnosis not present

## 2022-05-10 DIAGNOSIS — G8918 Other acute postprocedural pain: Secondary | ICD-10-CM | POA: Diagnosis not present

## 2022-05-10 DIAGNOSIS — S83231A Complex tear of medial meniscus, current injury, right knee, initial encounter: Secondary | ICD-10-CM | POA: Diagnosis not present

## 2022-05-10 DIAGNOSIS — M23303 Other meniscus derangements, unspecified medial meniscus, right knee: Secondary | ICD-10-CM | POA: Diagnosis not present

## 2022-05-10 DIAGNOSIS — M94262 Chondromalacia, left knee: Secondary | ICD-10-CM | POA: Diagnosis not present

## 2022-05-10 DIAGNOSIS — S83232A Complex tear of medial meniscus, current injury, left knee, initial encounter: Secondary | ICD-10-CM | POA: Diagnosis not present

## 2022-05-10 DIAGNOSIS — M94261 Chondromalacia, right knee: Secondary | ICD-10-CM | POA: Diagnosis not present

## 2022-05-10 DIAGNOSIS — Y999 Unspecified external cause status: Secondary | ICD-10-CM | POA: Diagnosis not present

## 2022-05-16 DIAGNOSIS — R262 Difficulty in walking, not elsewhere classified: Secondary | ICD-10-CM | POA: Diagnosis not present

## 2022-05-16 DIAGNOSIS — M25661 Stiffness of right knee, not elsewhere classified: Secondary | ICD-10-CM | POA: Diagnosis not present

## 2022-05-16 DIAGNOSIS — Z9889 Other specified postprocedural states: Secondary | ICD-10-CM | POA: Diagnosis not present

## 2022-05-16 DIAGNOSIS — M25662 Stiffness of left knee, not elsewhere classified: Secondary | ICD-10-CM | POA: Diagnosis not present

## 2022-05-24 DIAGNOSIS — R262 Difficulty in walking, not elsewhere classified: Secondary | ICD-10-CM | POA: Diagnosis not present

## 2022-05-24 DIAGNOSIS — M25661 Stiffness of right knee, not elsewhere classified: Secondary | ICD-10-CM | POA: Diagnosis not present

## 2022-05-24 DIAGNOSIS — M25662 Stiffness of left knee, not elsewhere classified: Secondary | ICD-10-CM | POA: Diagnosis not present

## 2022-05-31 DIAGNOSIS — M25661 Stiffness of right knee, not elsewhere classified: Secondary | ICD-10-CM | POA: Diagnosis not present

## 2022-05-31 DIAGNOSIS — M25662 Stiffness of left knee, not elsewhere classified: Secondary | ICD-10-CM | POA: Diagnosis not present

## 2022-05-31 DIAGNOSIS — R262 Difficulty in walking, not elsewhere classified: Secondary | ICD-10-CM | POA: Diagnosis not present

## 2022-06-05 DIAGNOSIS — M10021 Idiopathic gout, right elbow: Secondary | ICD-10-CM | POA: Diagnosis not present

## 2022-06-05 DIAGNOSIS — M778 Other enthesopathies, not elsewhere classified: Secondary | ICD-10-CM | POA: Diagnosis not present

## 2022-06-07 DIAGNOSIS — R262 Difficulty in walking, not elsewhere classified: Secondary | ICD-10-CM | POA: Diagnosis not present

## 2022-06-07 DIAGNOSIS — M25661 Stiffness of right knee, not elsewhere classified: Secondary | ICD-10-CM | POA: Diagnosis not present

## 2022-06-07 DIAGNOSIS — M25662 Stiffness of left knee, not elsewhere classified: Secondary | ICD-10-CM | POA: Diagnosis not present

## 2022-06-12 DIAGNOSIS — M25521 Pain in right elbow: Secondary | ICD-10-CM | POA: Diagnosis not present

## 2022-06-13 ENCOUNTER — Telehealth (INDEPENDENT_AMBULATORY_CARE_PROVIDER_SITE_OTHER): Payer: BC Managed Care – PPO | Admitting: Nurse Practitioner

## 2022-06-13 ENCOUNTER — Encounter: Payer: Self-pay | Admitting: Nurse Practitioner

## 2022-06-13 VITALS — BP 135/87 | HR 97 | Ht 67.0 in | Wt 244.8 lb

## 2022-06-13 DIAGNOSIS — E1159 Type 2 diabetes mellitus with other circulatory complications: Secondary | ICD-10-CM

## 2022-06-13 DIAGNOSIS — R262 Difficulty in walking, not elsewhere classified: Secondary | ICD-10-CM | POA: Diagnosis not present

## 2022-06-13 DIAGNOSIS — I152 Hypertension secondary to endocrine disorders: Secondary | ICD-10-CM | POA: Diagnosis not present

## 2022-06-13 DIAGNOSIS — M25661 Stiffness of right knee, not elsewhere classified: Secondary | ICD-10-CM | POA: Diagnosis not present

## 2022-06-13 DIAGNOSIS — M25662 Stiffness of left knee, not elsewhere classified: Secondary | ICD-10-CM | POA: Diagnosis not present

## 2022-06-13 DIAGNOSIS — E119 Type 2 diabetes mellitus without complications: Secondary | ICD-10-CM

## 2022-06-13 DIAGNOSIS — U071 COVID-19: Secondary | ICD-10-CM | POA: Diagnosis not present

## 2022-06-13 DIAGNOSIS — M25521 Pain in right elbow: Secondary | ICD-10-CM | POA: Diagnosis not present

## 2022-06-13 MED ORDER — METFORMIN HCL 1000 MG PO TABS: 1000.0000 mg | ORAL_TABLET | Freq: Every day | ORAL | 0 refills | Status: DC

## 2022-06-13 MED ORDER — GLUCOSE BLOOD VI STRP: ORAL_STRIP | 12 refills | Status: DC

## 2022-06-13 MED ORDER — MOLNUPIRAVIR EUA 200MG CAPSULE: 4.0000 | ORAL_CAPSULE | Freq: Two times a day (BID) | ORAL | 0 refills | Status: AC

## 2022-06-13 MED ORDER — ONETOUCH ULTRASOFT LANCETS MISC: 12 refills | Status: AC

## 2022-06-13 NOTE — Assessment & Plan Note (Signed)
Chronic, improved since starting Mounjaro.  Patient to continue on Mounjaro and metformin. Refill for test strips and lancets sent to pharmacy.

## 2022-06-13 NOTE — Assessment & Plan Note (Signed)
Chronic, improved after being rechecked.  Patient to continue on lisinopril 20 mg/day and metoprolol 24 mg/day.

## 2022-06-13 NOTE — Assessment & Plan Note (Signed)
Acute, mild. Patient fully vaccinated. At risk for severe disease. Will prescribe molnupiravir. Patient counseled to isolate for 5 days, if symptoms improve at that point may come out of isolation but must wear mask in public for additional 5 days. Educated to proceed to ER if symptoms worsen especially if he experiences chest pain, fever, shortness of breath. He reports his understanding.

## 2022-06-13 NOTE — Progress Notes (Signed)
Established Patient Office Visit  An audio-only tele-health visit was completed today for this patient. I connected with  Walter Reynolds. on 06/13/22 utilizing audio-only technology and verified that I am speaking with the correct person using two identifiers. The patient was located at their home, and I was located at the office of [facility] during the encounter. I discussed the limitations of evaluation and management by telemedicine. The patient expressed understanding and agreed to proceed.     Subjective   Patient ID: Walter Reynolds., male    DOB: 1971/08/26  Age: 51 y.o. MRN: [id number]  Chief Complaint  Patient presents with   Covid Positive    Patient arrives for telephone visit for the above.  He reports that symptoms started about 2 to 3 days ago he is experiencing cough and nasal congestion.  He does not have any fever, shortness of breath, or chest pain.  He took a at home COVID-19 test this morning and it was positive.  He was exposed to his cousin he was having similar symptoms a few days prior to his symptom onset.  He has medical history positive for type 2 diabetes, sleep apnea, hyperlipidemia, and hypertension.  Patient reports he saw a different provider for his diabetes about 3 months ago because he was having a hard time getting an appointment at this office.  Per chart review I do see that he was seen in March at [facility].  At that time his A1c was 6.3 which is an improvement from 9.4.  He is requesting refill of metformin which she is currently taking 1000 mg by mouth daily and refill on his testing supplies.    Review of Systems  Constitutional:  Negative for fever.  HENT:  Positive for congestion.   Respiratory:  Positive for cough. Negative for shortness of breath.   Cardiovascular:  Negative for chest pain.  Gastrointestinal:  Negative for abdominal pain and diarrhea.  Musculoskeletal:  Negative for myalgias.  Neurological:   Negative for headaches.      Objective:     BP 135/87   Pulse 97   Ht 5\' 7"  (1.702 m)   Wt 244 lb 12.8 oz (111 kg)   BMI 38.34 kg/m  BP Readings from Last 3 Encounters:  06/13/22 135/87  04/01/22 128/82  01/03/22 116/74   Wt Readings from Last 3 Encounters:  06/13/22 244 lb 12.8 oz (111 kg)  04/01/22 246 lb (111.6 kg)  01/03/22 254 lb (115.2 kg)      Physical Exam Comprehensive physical exam not completed today as office visit was conducted remotely.  Patient sounded well over the phone. Patient was alert and oriented, and appeared to have appropriate judgment.   No results found for any visits on 06/13/22.    The ASCVD Risk score (Arnett DK, et al., 2019) failed to calculate for the following reasons:   The valid total cholesterol range is 130 to 320 mg/dL    Assessment & Plan:   Problem List Items Addressed This Visit       Cardiovascular and Mediastinum   Hypertension associated with diabetes (HCC)    Chronic, improved after being rechecked.  Patient to continue on lisinopril 20 mg/day and metoprolol 24 mg/day.      Relevant Medications   metFORMIN (GLUCOPHAGE) 1000 MG tablet   tirzepatide (MOUNJARO) 2.5 MG/0.5ML Pen     Endocrine   Type 2 diabetes mellitus without complication (HCC)    Chronic, improved since  starting Mounjaro.  Patient to continue on Mounjaro and metformin. Refill for test strips and lancets sent to pharmacy.      Relevant Medications   glucose blood test strip   Lancets (ONETOUCH ULTRASOFT) lancets   metFORMIN (GLUCOPHAGE) 1000 MG tablet   tirzepatide (MOUNJARO) 2.5 MG/0.5ML Pen     Other   COVID-19 - Primary    Acute, mild. Patient fully vaccinated. At risk for severe disease. Will prescribe molnupiravir. Patient counseled to isolate for 5 days, if symptoms improve at that point may come out of isolation but must wear mask in public for additional 5 days. Educated to proceed to ER if symptoms worsen especially if he experiences  chest pain, fever, shortness of breath. He reports his understanding.        Relevant Medications   molnupiravir EUA (LAGEVRIO) 200 mg CAPS capsule    Return in about 2 weeks (around 06/27/2022) for Diabetes with PCP as close follow-up regarding covid infection..   Total time on telephone was 26 minutes and 54 seconds.   Elenore Paddy, NP

## 2022-06-22 DIAGNOSIS — M25521 Pain in right elbow: Secondary | ICD-10-CM | POA: Diagnosis not present

## 2022-06-24 ENCOUNTER — Ambulatory Visit: Payer: BC Managed Care – PPO | Admitting: Emergency Medicine

## 2022-06-28 DIAGNOSIS — F4320 Adjustment disorder, unspecified: Secondary | ICD-10-CM | POA: Diagnosis not present

## 2022-07-02 DIAGNOSIS — M25521 Pain in right elbow: Secondary | ICD-10-CM | POA: Diagnosis not present

## 2022-07-03 DIAGNOSIS — G4733 Obstructive sleep apnea (adult) (pediatric): Secondary | ICD-10-CM | POA: Diagnosis not present

## 2022-07-04 DIAGNOSIS — E1165 Type 2 diabetes mellitus with hyperglycemia: Secondary | ICD-10-CM | POA: Diagnosis not present

## 2022-07-04 DIAGNOSIS — E1169 Type 2 diabetes mellitus with other specified complication: Secondary | ICD-10-CM | POA: Diagnosis not present

## 2022-07-04 DIAGNOSIS — I152 Hypertension secondary to endocrine disorders: Secondary | ICD-10-CM | POA: Diagnosis not present

## 2022-07-04 DIAGNOSIS — E785 Hyperlipidemia, unspecified: Secondary | ICD-10-CM | POA: Diagnosis not present

## 2022-07-04 DIAGNOSIS — E1159 Type 2 diabetes mellitus with other circulatory complications: Secondary | ICD-10-CM | POA: Diagnosis not present

## 2022-07-04 DIAGNOSIS — R202 Paresthesia of skin: Secondary | ICD-10-CM | POA: Diagnosis not present

## 2022-07-11 DIAGNOSIS — F4320 Adjustment disorder, unspecified: Secondary | ICD-10-CM | POA: Diagnosis not present

## 2022-07-25 DIAGNOSIS — F4320 Adjustment disorder, unspecified: Secondary | ICD-10-CM | POA: Diagnosis not present

## 2022-07-30 ENCOUNTER — Other Ambulatory Visit: Payer: Self-pay | Admitting: *Deleted

## 2022-07-30 DIAGNOSIS — R911 Solitary pulmonary nodule: Secondary | ICD-10-CM

## 2022-08-01 DIAGNOSIS — E1165 Type 2 diabetes mellitus with hyperglycemia: Secondary | ICD-10-CM | POA: Diagnosis not present

## 2022-08-01 DIAGNOSIS — H538 Other visual disturbances: Secondary | ICD-10-CM | POA: Diagnosis not present

## 2022-08-01 DIAGNOSIS — I152 Hypertension secondary to endocrine disorders: Secondary | ICD-10-CM | POA: Diagnosis not present

## 2022-08-01 DIAGNOSIS — Z9109 Other allergy status, other than to drugs and biological substances: Secondary | ICD-10-CM | POA: Diagnosis not present

## 2022-08-01 DIAGNOSIS — E1159 Type 2 diabetes mellitus with other circulatory complications: Secondary | ICD-10-CM | POA: Diagnosis not present

## 2022-08-08 DIAGNOSIS — F4320 Adjustment disorder, unspecified: Secondary | ICD-10-CM | POA: Diagnosis not present

## 2022-08-14 ENCOUNTER — Other Ambulatory Visit: Payer: Self-pay | Admitting: Nurse Practitioner

## 2022-08-14 DIAGNOSIS — I152 Hypertension secondary to endocrine disorders: Secondary | ICD-10-CM

## 2022-08-16 ENCOUNTER — Other Ambulatory Visit: Payer: Self-pay | Admitting: Nurse Practitioner

## 2022-08-16 DIAGNOSIS — I152 Hypertension secondary to endocrine disorders: Secondary | ICD-10-CM

## 2022-08-20 MED ORDER — METFORMIN HCL 1000 MG PO TABS: 1000.0000 mg | ORAL_TABLET | Freq: Every day | ORAL | 0 refills | Status: DC

## 2022-09-11 ENCOUNTER — Other Ambulatory Visit: Payer: Self-pay | Admitting: Emergency Medicine

## 2022-09-11 DIAGNOSIS — I152 Hypertension secondary to endocrine disorders: Secondary | ICD-10-CM

## 2022-09-18 ENCOUNTER — Other Ambulatory Visit: Payer: BC Managed Care – PPO

## 2022-10-01 ENCOUNTER — Other Ambulatory Visit: Payer: Self-pay | Admitting: *Deleted

## 2022-10-01 DIAGNOSIS — R911 Solitary pulmonary nodule: Secondary | ICD-10-CM

## 2022-10-06 ENCOUNTER — Other Ambulatory Visit: Payer: Self-pay | Admitting: Emergency Medicine

## 2022-10-06 DIAGNOSIS — I152 Hypertension secondary to endocrine disorders: Secondary | ICD-10-CM

## 2022-10-18 ENCOUNTER — Ambulatory Visit (HOSPITAL_BASED_OUTPATIENT_CLINIC_OR_DEPARTMENT_OTHER): Payer: BC Managed Care – PPO

## 2022-10-24 ENCOUNTER — Ambulatory Visit (HOSPITAL_BASED_OUTPATIENT_CLINIC_OR_DEPARTMENT_OTHER)
Admission: RE | Admit: 2022-10-24 | Discharge: 2022-10-24 | Disposition: A | Discharge status: Home / Self Care | Payer: BC Managed Care – PPO | Source: Ambulatory Visit | Attending: Cardiology | Admitting: Cardiology

## 2022-10-24 DIAGNOSIS — R911 Solitary pulmonary nodule: Secondary | ICD-10-CM | POA: Diagnosis present

## 2022-10-29 ENCOUNTER — Encounter: Payer: Self-pay | Admitting: *Deleted

## 2022-11-02 ENCOUNTER — Other Ambulatory Visit: Payer: Self-pay | Admitting: Emergency Medicine

## 2022-11-02 ENCOUNTER — Other Ambulatory Visit: Payer: Self-pay | Admitting: Primary Care

## 2022-11-04 NOTE — Telephone Encounter (Signed)
Please advise on med refill. 

## 2022-11-05 ENCOUNTER — Ambulatory Visit: Payer: BC Managed Care – PPO | Admitting: Emergency Medicine

## 2022-11-05 ENCOUNTER — Encounter: Payer: Self-pay | Admitting: Emergency Medicine

## 2022-11-05 VITALS — BP 134/82 | HR 88 | Temp 98.4°F | Ht 67.0 in | Wt 256.0 lb

## 2022-11-05 DIAGNOSIS — E1169 Type 2 diabetes mellitus with other specified complication: Secondary | ICD-10-CM | POA: Diagnosis not present

## 2022-11-05 DIAGNOSIS — I152 Hypertension secondary to endocrine disorders: Secondary | ICD-10-CM | POA: Diagnosis not present

## 2022-11-05 DIAGNOSIS — E1159 Type 2 diabetes mellitus with other circulatory complications: Secondary | ICD-10-CM

## 2022-11-05 DIAGNOSIS — E785 Hyperlipidemia, unspecified: Secondary | ICD-10-CM | POA: Diagnosis not present

## 2022-11-05 DIAGNOSIS — Z23 Encounter for immunization: Secondary | ICD-10-CM

## 2022-11-05 LAB — COMPREHENSIVE METABOLIC PANEL
ALT: 31 U/L (ref 0–53)
AST: 20 U/L (ref 0–37)
Albumin: 4.6 g/dL (ref 3.5–5.2)
Alkaline Phosphatase: 109 U/L (ref 39–117)
BUN: 13 mg/dL (ref 6–23)
CO2: 29 mEq/L (ref 19–32)
Calcium: 9.4 mg/dL (ref 8.4–10.5)
Chloride: 97 mEq/L (ref 96–112)
Creatinine, Ser: 0.97 mg/dL (ref 0.40–1.50)
GFR: 89.57 mL/min (ref >60.00)
Glucose, Bld: 347 mg/dL — ABNORMAL HIGH (ref 70–99)
Potassium: 4.6 mEq/L (ref 3.5–5.1)
Sodium: 135 mEq/L (ref 135–145)
Total Bilirubin: 0.4 mg/dL (ref 0.2–1.2)
Total Protein: 7.1 g/dL (ref 6.0–8.3)

## 2022-11-05 LAB — LIPID PANEL
Cholesterol: 129 mg/dL (ref 0–200)
HDL: 26.9 mg/dL — ABNORMAL LOW (ref >39.00)
NonHDL: 102.36
Total CHOL/HDL Ratio: 5
Triglycerides: 324 mg/dL — ABNORMAL HIGH (ref 0.0–149.0)
VLDL: 64.8 mg/dL — ABNORMAL HIGH (ref 0.0–40.0)

## 2022-11-05 LAB — LDL CHOLESTEROL, DIRECT: Direct LDL: 61 mg/dL

## 2022-11-05 LAB — MICROALBUMIN / CREATININE URINE RATIO
Creatinine,U: 147.5 mg/dL
Microalb Creat Ratio: 2.6 mg/g (ref 0.0–30.0)
Microalb, Ur: 3.8 mg/dL — ABNORMAL HIGH (ref 0.0–1.9)

## 2022-11-05 LAB — POCT GLYCOSYLATED HEMOGLOBIN (HGB A1C): Hemoglobin A1C: 10.7 % — AB (ref 4.0–5.6)

## 2022-11-05 MED ORDER — TRULICITY 1.5 MG/0.5ML ~~LOC~~ SOAJ: 1.5000 mg | SUBCUTANEOUS | 3 refills | Status: DC

## 2022-11-05 MED ORDER — DAPAGLIFLOZIN PROPANEDIOL 10 MG PO TABS: 10.0000 mg | ORAL_TABLET | Freq: Every day | ORAL | 3 refills | Status: DC

## 2022-11-05 MED ORDER — LISINOPRIL 20 MG PO TABS: 20.0000 mg | ORAL_TABLET | Freq: Every day | ORAL | 1 refills | Status: DC

## 2022-11-05 NOTE — Patient Instructions (Signed)
Continue metformin 1000 mg twice a day Start weekly Trulicity 1.5 mg and daily Farxiga 10 mg Follow-up in 3 months.  Diabetes Mellitus and Nutrition, Adult When you have diabetes, or diabetes mellitus, it is very important to have healthy eating habits because your blood sugar (glucose) levels are greatly affected by what you eat and drink. Eating healthy foods in the right amounts, at about the same times every day, can help you: Manage your blood glucose. Lower your risk of heart disease. Improve your blood pressure. Reach or maintain a healthy weight. What can affect my meal plan? Every person with diabetes is different, and each person has different needs for a meal plan. Your health care provider may recommend that you work with a dietitian to make a meal plan that is best for you. Your meal plan may vary depending on factors such as: The calories you need. The medicines you take. Your weight. Your blood glucose, blood pressure, and cholesterol levels. Your activity level. Other health conditions you have, such as heart or kidney disease. How do carbohydrates affect me? Carbohydrates, also called carbs, affect your blood glucose level more than any other type of food. Eating carbs raises the amount of glucose in your blood. It is important to know how many carbs you can safely have in each meal. This is different for every person. Your dietitian can help you calculate how many carbs you should have at each meal and for each snack. How does alcohol affect me? Alcohol can cause a decrease in blood glucose (hypoglycemia), especially if you use insulin or take certain diabetes medicines by mouth. Hypoglycemia can be a life-threatening condition. Symptoms of hypoglycemia, such as sleepiness, dizziness, and confusion, are similar to symptoms of having too much alcohol. Do not drink alcohol if: Your health care provider tells you not to drink. You are pregnant, may be pregnant, or are planning  to become pregnant. If you drink alcohol: Limit how much you have to: 0-1 drink a day for women. 0-2 drinks a day for men. Know how much alcohol is in your drink. In the [place] one drink equals one 12 oz bottle of beer (355 mL), one 5 oz glass of wine (148 mL), or one 1 oz glass of hard liquor (44 mL). Keep yourself hydrated with water, diet soda, or unsweetened iced tea. Keep in mind that regular soda, juice, and other mixers may contain a lot of sugar and must be counted as carbs. What are tips for following this plan?  Reading food labels Start by checking the serving size on the Nutrition Facts label of packaged foods and drinks. The number of calories and the amount of carbs, fats, and other nutrients listed on the label are based on one serving of the item. Many items contain more than one serving per package. Check the total grams (g) of carbs in one serving. Check the number of grams of saturated fats and trans fats in one serving. Choose foods that have a low amount or none of these fats. Check the number of milligrams (mg) of salt (sodium) in one serving. Most people should limit total sodium intake to less than 2,300 mg per day. Always check the nutrition information of foods labeled as "low-fat" or "nonfat." These foods may be higher in added sugar or refined carbs and should be avoided. Talk to your dietitian to identify your daily goals for nutrients listed on the label. Shopping Avoid buying canned, pre-made, or processed foods. These foods tend  to be high in fat, sodium, and added sugar. Shop around the outside edge of the grocery store. This is where you will most often find fresh fruits and vegetables, bulk grains, fresh meats, and fresh dairy products. Cooking Use low-heat cooking methods, such as baking, instead of high-heat cooking methods, such as deep frying. Cook using healthy oils, such as olive, canola, or sunflower oil. Avoid cooking with butter, cream, or high-fat  meats. Meal planning Eat meals and snacks regularly, preferably at the same times every day. Avoid going long periods of time without eating. Eat foods that are high in fiber, such as fresh fruits, vegetables, beans, and whole grains. Eat 4-6 oz (112-168 g) of lean protein each day, such as lean meat, chicken, fish, eggs, or tofu. One ounce (oz) (28 g) of lean protein is equal to: 1 oz (28 g) of meat, chicken, or fish. 1 egg.  cup (62 g) of tofu. Eat some foods each day that contain healthy fats, such as avocado, nuts, seeds, and fish. What foods should I eat? Fruits Berries. Apples. Oranges. Peaches. Apricots. Plums. Grapes. Mangoes. Papayas. Pomegranates. Kiwi. Cherries. Vegetables Leafy greens, including lettuce, spinach, kale, chard, collard greens, mustard greens, and cabbage. Beets. Cauliflower. Broccoli. Carrots. Green beans. Tomatoes. Peppers. Onions. Cucumbers. Brussels sprouts. Grains Whole grains, such as whole-wheat or whole-grain bread, crackers, tortillas, cereal, and pasta. Unsweetened oatmeal. Quinoa. Brown or wild rice. Meats and other proteins Seafood. Poultry without skin. Lean cuts of poultry and beef. Tofu. Nuts. Seeds. Dairy Low-fat or fat-free dairy products such as milk, yogurt, and cheese. The items listed above may not be a complete list of foods and beverages you can eat and drink. Contact a dietitian for more information. What foods should I avoid? Fruits Fruits canned with syrup. Vegetables Canned vegetables. Frozen vegetables with butter or cream sauce. Grains Refined white flour and flour products such as bread, pasta, snack foods, and cereals. Avoid all processed foods. Meats and other proteins Fatty cuts of meat. Poultry with skin. Breaded or fried meats. Processed meat. Avoid saturated fats. Dairy Full-fat yogurt, cheese, or milk. Beverages Sweetened drinks, such as soda or iced tea. The items listed above may not be a complete list of foods and  beverages you should avoid. Contact a dietitian for more information. Questions to ask a health care provider Do I need to meet with a certified diabetes care and education specialist? Do I need to meet with a dietitian? What number can I call if I have questions? When are the best times to check my blood glucose? Where to find more information: American Diabetes Association: diabetes.org Academy of Nutrition and Dietetics: eatright.[employer] of Diabetes and Digestive and Kidney Diseases: [employer] Association of Diabetes Care & Education Specialists: diabeteseducator.org Summary It is important to have healthy eating habits because your blood sugar (glucose) levels are greatly affected by what you eat and drink. It is important to use alcohol carefully. A healthy meal plan will help you manage your blood glucose and lower your risk of heart disease. Your health care provider may recommend that you work with a dietitian to make a meal plan that is best for you. This information is not intended to replace advice given to you by your health care provider. Make sure you discuss any questions you have with your health care provider. Document Revised: 07/05/2020 Document Reviewed: 07/05/2020 Elsevier Patient Education  [place].

## 2022-11-05 NOTE — Assessment & Plan Note (Signed)
Well-controlled hypertension. Continue metoprolol succinate 25 mg daily and lisinopril 20 mg daily BP Readings from Last 3 Encounters:  11/05/22 134/82  06/13/22 135/87  04/01/22 128/82  Uncontrolled diabetes with hemoglobin A1c of 10.7. Continue metformin 1000 mg twice a day and start weekly Trulicity or the like depending on insurance coverage.  Recommend to start Farxiga 10 mg or the like. Cardiovascular risk associated with hypertension and diabetes discussed. Diet and nutrition discussed. Follow-up in 3 months.

## 2022-11-05 NOTE — Progress Notes (Signed)
Walter Reynolds. 51 y.o.   Chief Complaint  Patient presents with   Urinary Frequency    Progressively getting worse, Blood glucose in high 300s     HISTORY OF PRESENT ILLNESS: This is a 51 y.o. male here for follow-up of diabetes. Complaining of urinary frequency and diabetes out-of-control Presently only taking metformin.  Not able to afford Mounjaro No other complaints or medical concerns today.  Urinary Frequency  Associated symptoms include frequency. Pertinent negatives include no chills, nausea or vomiting.     Prior to Admission medications   Medication Sig Start Date End Date Taking? Authorizing Provider  Armodafinil 50 MG tablet Take 1 tablet (50 mg total) by mouth daily. 04/01/22  Yes Olalere, Adewale A, MD  aspirin EC 81 MG EC tablet Take 1 tablet (81 mg total) by mouth daily. 04/02/20  Yes Albertine Grates, MD  atorvastatin (LIPITOR) 40 MG tablet TAKE 1 TABLET BY MOUTH EVERY DAY 11/02/22  Yes Terrel Nesheiwat, Eilleen Kempf, MD  fluticasone (FLONASE) 50 MCG/ACT nasal spray SPRAY 2 SPRAYS INTO EACH NOSTRIL EVERY DAY Patient taking differently: Place 2 sprays into both nostrils daily. 01/11/21  Yes Just, Azalee Course, FNP  glucose blood test strip Use as instructed 06/13/22  Yes Elenore Paddy, NP  ibuprofen (ADVIL) 600 MG tablet Take 1 tablet (600 mg total) by mouth every 6 (six) hours as needed. 07/17/21  Yes Fayrene Helper, PA-C  Lancets [facility]  ULTRASOFT) lancets Use as instructed 06/13/22  Yes Elenore Paddy, NP  lisinopril (ZESTRIL) 20 MG tablet TAKE 1 TABLET BY MOUTH EVERY DAY 05/04/22  Yes Georgina Quint, MD  metFORMIN (GLUCOPHAGE) 1000 MG tablet TAKE 1 TABLET BY MOUTH EVERY DAY WITH BREAKFAST 10/07/22  Yes Johan Creveling, Eilleen Kempf, MD  metoprolol succinate (TOPROL-XL) 25 MG 24 hr tablet TAKE 1 TABLET BY MOUTH EVERY DAY 03/13/22  Yes Ariellah Faust, Eilleen Kempf, MD  tirzepatide [facility]) 2.5 MG/0.5ML Pen Inject 2.5 mg into the skin once a week. Patient not taking: Reported on 11/05/2022     [provider]    Allergies  Allergen Reactions   Doxycycline Hives   Erythromycin Base Hives   Glipizide Shortness Of Breath    Shakey    Armodafinil Palpitations    In high doses per patient.     Patient Active Problem List   Diagnosis Date Noted   COVID-19 06/13/2022   Chronic right shoulder pain 04/27/2021   Erectile dysfunction 04/26/2021   Lack of energy 04/26/2021   Body mass index (BMI) of 40.1-44.9 in adult [facility]) 07/17/2020   S/P arthroscopy of left shoulder 02/01/2020   Impingement syndrome of left shoulder 01/14/2020   Hypertension associated with diabetes (HCC) 09/27/2019   Morbid obesity (HCC) 09/27/2019   Dyslipidemia 09/09/2018   Environmental allergies 09/09/2018   BMI 39.0-39.9,adult 09/03/2017   Type 2 diabetes mellitus without complication (HCC) 09/03/2017   Anemia 04/14/2017   Hyperlipidemia 05/02/2016   OSA (obstructive sleep apnea) 09/06/2015   Benign essential HTN 09/06/2015    Past Medical History:  Diagnosis Date   Allergy    Diabetes mellitus without complication (HCC)    Elevated cholesterol    Hernia    bi lateral hernia repair   History of kidney stones    Hypertension    Sleep apnea 2014   severe   Umbilical hernia without obstruction and without gangrene 09/03/2017    Past Surgical History:  Procedure Laterality Date   CARPAL TUNNEL RELEASE Right 2007   CARPAL TUNNEL RELEASE Left 10/28/2014  Procedure: LEFT CARPAL TUNNEL RELEASE;  Surgeon: Dairl Ponder, MD;  Location: [facility];  Service: Orthopedics;  Laterality: Left;   FOOT SURGERY Left    INGUINAL HERNIA REPAIR     times two, each side   KIDNEY STONE SURGERY     MASS EXCISION Left 10/28/2014   Procedure: LEFT WRIST VOLAR MASS EXCISION;  Surgeon: Dairl Ponder, MD;  Location: [facility];  Service: Orthopedics;  Laterality: Left;   UMBILICAL HERNIA REPAIR N/A 10/27/2017   Procedure: HERNIA REPAIR UMBILICAL ADULT;  Surgeon:  Berna Bue, MD;  Location: [facility];  Service: General;  Laterality: N/A;    Social History   Socioeconomic History   Marital status: Married    Spouse name: JAICOB DIA   Number of children: 2   Years of education: Associates   Highest education level: Not on file  Occupational History   Occupation: Merchandiser, retail    Comment: [employer]  Tobacco Use   Smoking status: Never   Smokeless tobacco: Never  Vaping Use   Vaping Use: Never used  Substance and Sexual Activity   Alcohol use: No    Alcohol/week: 0.0 standard drinks of alcohol   Drug use: No   Sexual activity: Yes    Partners: Female    Birth control/protection: Condom  Other Topics Concern   Not on file  Social History Narrative   Lives with his wife and their 2 children.   He has 3 sons from a previous relationship that live independently.   Formerly in the [employer].   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   Social Determinants of Health   Financial Resource Strain: Not on file  Food Insecurity: Not on file  Transportation Needs: Not on file  Physical Activity: Not on file  Stress: Not on file  Social Connections: Not on file  Intimate Partner Violence: Not on file    Family History  Problem Relation Age of Onset   Diabetes Mother    Stroke Mother        4 strokes   Heart attack Mother    Heart disease Mother    Hypertension Father    Hypertension Sister    Heart disease Sister        pacemaker     Review of Systems  Constitutional: Negative.  Negative for chills and fever.  HENT: Negative.  Negative for congestion and sore throat.   Respiratory: Negative.  Negative for cough and shortness of breath.   Cardiovascular: Negative.  Negative for chest pain and palpitations.  Gastrointestinal:  Negative for abdominal pain, diarrhea, nausea and vomiting.  Genitourinary:  Positive for frequency.   Musculoskeletal: Negative.   Skin: Negative.  Negative for rash.  Neurological: Negative.  Negative for dizziness and headaches.  All other systems reviewed and are negative.   Today's Vitals   11/05/22 0833  BP: 134/82  Pulse: 88  Temp: 98.4 F (36.9 C)  TempSrc: Oral  SpO2: 92%  Weight: 256 lb (116.1 kg)  Height: 5\' 7"  (1.702 m)   Body mass index is 40.1 kg/m.  Physical Exam Vitals reviewed.  Constitutional:      Appearance: Normal appearance. He is obese.  HENT:     Head: Normocephalic.     Mouth/Throat:     Mouth: Mucous membranes are moist.     Pharynx: Oropharynx is clear.  Eyes:     Extraocular  Movements: Extraocular movements intact.     Conjunctiva/sclera: Conjunctivae normal.     Pupils: Pupils are equal, round, and reactive to light.  Cardiovascular:     Rate and Rhythm: Normal rate and regular rhythm.     Pulses: Normal pulses.     Heart sounds: Normal heart sounds.  Pulmonary:     Effort: Pulmonary effort is normal.     Breath sounds: Normal breath sounds.  Abdominal:     Palpations: Abdomen is soft.     Tenderness: There is no abdominal tenderness.  Musculoskeletal:     Cervical back: No tenderness.  Lymphadenopathy:     Cervical: No cervical adenopathy.  Skin:    General: Skin is warm and dry.     Capillary Refill: Capillary refill takes less than 2 seconds.  Neurological:     General: No focal deficit present.     Mental Status: He is alert and oriented to person, place, and time.  Psychiatric:        Mood and Affect: Mood normal.        Behavior: Behavior normal.    Results for orders placed or performed in visit on 11/05/22 (from the past 24 hour(s))  POCT HgB A1C     Status: Abnormal   Collection Time: 11/05/22  9:54 AM  Result Value Ref Range   Hemoglobin A1C 10.7 (A) 4.0 - 5.6 %   HbA1c POC (<> result, manual entry)     HbA1c, POC (prediabetic range)     HbA1c, POC (controlled diabetic range)       ASSESSMENT & PLAN: A total  of 49 minutes was spent with the patient and counseling/coordination of care regarding preparing for this visit, review of most recent office visit notes, review of multiple chronic medical conditions and their management, review of all medications and changes made, review of most recent blood work results including interpretation of today's hemoglobin A1c, cardiovascular risks associated with uncontrolled diabetes, education on nutrition, prognosis, documentation, and need for follow-up in 3 months.  Problem List Items Addressed This Visit       Cardiovascular and Mediastinum   Hypertension associated with diabetes (HCC) - Primary    Well-controlled hypertension. Continue metoprolol succinate 25 mg daily and lisinopril 20 mg daily BP Readings from Last 3 Encounters:  11/05/22 134/82  06/13/22 135/87  04/01/22 128/82  Uncontrolled diabetes with hemoglobin A1c of 10.7. Continue metformin 1000 mg twice a day and start weekly Trulicity or the like depending on insurance coverage.  Recommend to start Farxiga 10 mg or the like. Cardiovascular risk associated with hypertension and diabetes discussed. Diet and nutrition discussed. Follow-up in 3 months.       Relevant Medications   lisinopril (ZESTRIL) 20 MG tablet   Dulaglutide (TRULICITY) 1.5 MG/0.5ML SOPN   dapagliflozin propanediol (FARXIGA) 10 MG TABS tablet   Other Relevant Orders   POCT HgB A1C   Comprehensive metabolic panel   Urine Microalbumin w/creat. ratio     Endocrine   Dyslipidemia associated with type 2 diabetes mellitus (HCC)    Stable.  Diet and nutrition discussed. Lipid profile done today. Continue atorvastatin 40 mg daily.      Relevant Medications   lisinopril (ZESTRIL) 20 MG tablet   Dulaglutide (TRULICITY) 1.5 MG/0.5ML SOPN   dapagliflozin propanediol (FARXIGA) 10 MG TABS tablet   Other Relevant Orders   POCT HgB A1C   Lipid panel   Other Visit Diagnoses     Need for vaccination  Relevant Orders    Flu Vaccine QUAD 6+ mos PF IM (Fluarix Quad PF)   Zoster Recombinant (Shingrix )      Patient Instructions  Continue metformin 1000 mg twice a day Start weekly Trulicity 1.5 mg and daily Farxiga 10 mg Follow-up in 3 months.  Diabetes Mellitus and Nutrition, Adult When you have diabetes, or diabetes mellitus, it is very important to have healthy eating habits because your blood sugar (glucose) levels are greatly affected by what you eat and drink. Eating healthy foods in the right amounts, at about the same times every day, can help you: Manage your blood glucose. Lower your risk of heart disease. Improve your blood pressure. Reach or maintain a healthy weight. What can affect my meal plan? Every person with diabetes is different, and each person has different needs for a meal plan. Your health care provider may recommend that you work with a dietitian to make a meal plan that is best for you. Your meal plan may vary depending on factors such as: The calories you need. The medicines you take. Your weight. Your blood glucose, blood pressure, and cholesterol levels. Your activity level. Other health conditions you have, such as heart or kidney disease. How do carbohydrates affect me? Carbohydrates, also called carbs, affect your blood glucose level more than any other type of food. Eating carbs raises the amount of glucose in your blood. It is important to know how many carbs you can safely have in each meal. This is different for every person. Your dietitian can help you calculate how many carbs you should have at each meal and for each snack. How does alcohol affect me? Alcohol can cause a decrease in blood glucose (hypoglycemia), especially if you use insulin or take certain diabetes medicines by mouth. Hypoglycemia can be a life-threatening condition. Symptoms of hypoglycemia, such as sleepiness, dizziness, and confusion, are similar to symptoms of having too much alcohol. Do not  drink alcohol if: Your health care provider tells you not to drink. You are pregnant, may be pregnant, or are planning to become pregnant. If you drink alcohol: Limit how much you have to: 0-1 drink a day for women. 0-2 drinks a day for men. Know how much alcohol is in your drink. In the [place] one drink equals one 12 oz bottle of beer (355 mL), one 5 oz glass of wine (148 mL), or one 1 oz glass of hard liquor (44 mL). Keep yourself hydrated with water, diet soda, or unsweetened iced tea. Keep in mind that regular soda, juice, and other mixers may contain a lot of sugar and must be counted as carbs. What are tips for following this plan?  Reading food labels Start by checking the serving size on the Nutrition Facts label of packaged foods and drinks. The number of calories and the amount of carbs, fats, and other nutrients listed on the label are based on one serving of the item. Many items contain more than one serving per package. Check the total grams (g) of carbs in one serving. Check the number of grams of saturated fats and trans fats in one serving. Choose foods that have a low amount or none of these fats. Check the number of milligrams (mg) of salt (sodium) in one serving. Most people should limit total sodium intake to less than 2,300 mg per day. Always check the nutrition information of foods labeled as "low-fat" or "nonfat." These foods may be higher in added sugar or refined carbs and  should be avoided. Talk to your dietitian to identify your daily goals for nutrients listed on the label. Shopping Avoid buying canned, pre-made, or processed foods. These foods tend to be high in fat, sodium, and added sugar. Shop around the outside edge of the grocery store. This is where you will most often find fresh fruits and vegetables, bulk grains, fresh meats, and fresh dairy products. Cooking Use low-heat cooking methods, such as baking, instead of high-heat cooking methods, such as deep  frying. Cook using healthy oils, such as olive, canola, or sunflower oil. Avoid cooking with butter, cream, or high-fat meats. Meal planning Eat meals and snacks regularly, preferably at the same times every day. Avoid going long periods of time without eating. Eat foods that are high in fiber, such as fresh fruits, vegetables, beans, and whole grains. Eat 4-6 oz (112-168 g) of lean protein each day, such as lean meat, chicken, fish, eggs, or tofu. One ounce (oz) (28 g) of lean protein is equal to: 1 oz (28 g) of meat, chicken, or fish. 1 egg.  cup (62 g) of tofu. Eat some foods each day that contain healthy fats, such as avocado, nuts, seeds, and fish. What foods should I eat? Fruits Berries. Apples. Oranges. Peaches. Apricots. Plums. Grapes. Mangoes. Papayas. Pomegranates. Kiwi. Cherries. Vegetables Leafy greens, including lettuce, spinach, kale, chard, collard greens, mustard greens, and cabbage. Beets. Cauliflower. Broccoli. Carrots. Green beans. Tomatoes. Peppers. Onions. Cucumbers. Brussels sprouts. Grains Whole grains, such as whole-wheat or whole-grain bread, crackers, tortillas, cereal, and pasta. Unsweetened oatmeal. Quinoa. Brown or wild rice. Meats and other proteins Seafood. Poultry without skin. Lean cuts of poultry and beef. Tofu. Nuts. Seeds. Dairy Low-fat or fat-free dairy products such as milk, yogurt, and cheese. The items listed above may not be a complete list of foods and beverages you can eat and drink. Contact a dietitian for more information. What foods should I avoid? Fruits Fruits canned with syrup. Vegetables Canned vegetables. Frozen vegetables with butter or cream sauce. Grains Refined white flour and flour products such as bread, pasta, snack foods, and cereals. Avoid all processed foods. Meats and other proteins Fatty cuts of meat. Poultry with skin. Breaded or fried meats. Processed meat. Avoid saturated fats. Dairy Full-fat yogurt, cheese, or  milk. Beverages Sweetened drinks, such as soda or iced tea. The items listed above may not be a complete list of foods and beverages you should avoid. Contact a dietitian for more information. Questions to ask a health care provider Do I need to meet with a certified diabetes care and education specialist? Do I need to meet with a dietitian? What number can I call if I have questions? When are the best times to check my blood glucose? Where to find more information: American Diabetes Association: diabetes.org Academy of Nutrition and Dietetics: eatright.[employer] of Diabetes and Digestive and Kidney Diseases: [employer].is Association of Diabetes Care & Education Specialists: diabeteseducator.org Summary It is important to have healthy eating habits because your blood sugar (glucose) levels are greatly affected by what you eat and drink. It is important to use alcohol carefully. A healthy meal plan will help you manage your blood glucose and lower your risk of heart disease. Your health care provider may recommend that you work with a dietitian to make a meal plan that is best for you. This information is not intended to replace advice given to you by your health care provider. Make sure you discuss any questions you have with your health  care provider. Document Revised: 07/05/2020 Document Reviewed: 07/05/2020 Elsevier Patient Education  2023 Elsevier Inc.    Edwina BarthMiguel Arria Naim, MD [facility]

## 2022-11-05 NOTE — Assessment & Plan Note (Signed)
Stable.  Diet and nutrition discussed. Lipid profile done today. Continue atorvastatin 40 mg daily.

## 2022-11-12 DIAGNOSIS — H538 Other visual disturbances: Secondary | ICD-10-CM | POA: Insufficient documentation

## 2022-11-13 ENCOUNTER — Telehealth: Payer: Self-pay

## 2022-11-13 ENCOUNTER — Telehealth: Payer: Self-pay | Admitting: Emergency Medicine

## 2022-11-13 DIAGNOSIS — H538 Other visual disturbances: Secondary | ICD-10-CM

## 2022-11-13 NOTE — Telephone Encounter (Signed)
PA request received via CMM through [employer] for Armodafinil 50MG  tablets.  PA has been submitted and is awaiting determination.  Key - PA Case ID: [id number] Cluster

## 2022-11-13 NOTE — Telephone Encounter (Signed)
I do not think this is due to Trulicity.  Needs ophthalmology evaluation.  Place referral please.

## 2022-11-13 NOTE — Telephone Encounter (Signed)
Patient is taking Trulicity and is experiencing cloudiness in his vision.  If he stops taking the trulicity will his vision improve.  Please advise.

## 2022-11-13 NOTE — Telephone Encounter (Signed)
Called patient to inform him of provider recommendation  

## 2022-11-14 NOTE — Telephone Encounter (Signed)
PA for Armodafinil has been APPROVED from 11/13/2022-11/14/2023.

## 2022-11-27 NOTE — Telephone Encounter (Signed)
Approval letter has been attached in patient documents 

## 2023-02-05 ENCOUNTER — Ambulatory Visit: Payer: BC Managed Care – PPO | Admitting: Emergency Medicine

## 2023-02-05 ENCOUNTER — Encounter: Payer: Self-pay | Admitting: Emergency Medicine

## 2023-02-05 VITALS — BP 130/82 | HR 78 | Temp 98.0°F | Ht 67.0 in | Wt 257.4 lb

## 2023-02-05 DIAGNOSIS — E1169 Type 2 diabetes mellitus with other specified complication: Secondary | ICD-10-CM

## 2023-02-05 DIAGNOSIS — E1159 Type 2 diabetes mellitus with other circulatory complications: Secondary | ICD-10-CM | POA: Diagnosis not present

## 2023-02-05 DIAGNOSIS — I152 Hypertension secondary to endocrine disorders: Secondary | ICD-10-CM

## 2023-02-05 DIAGNOSIS — E119 Type 2 diabetes mellitus without complications: Secondary | ICD-10-CM

## 2023-02-05 DIAGNOSIS — E785 Hyperlipidemia, unspecified: Secondary | ICD-10-CM

## 2023-02-05 LAB — POCT GLYCOSYLATED HEMOGLOBIN (HGB A1C): Hemoglobin A1C: 7.1 % — AB (ref 4.0–5.6)

## 2023-02-05 NOTE — Assessment & Plan Note (Signed)
Feeling better but not loosing weight. Exercising more. Diet and nutrition discussed.

## 2023-02-05 NOTE — Patient Instructions (Signed)

## 2023-02-05 NOTE — Progress Notes (Signed)
Walter Reynolds. 52 y.o.   Chief Complaint  Patient presents with   Follow-up    36mth f/u appt, no concerns     HISTORY OF PRESENT ILLNESS: This is a 52y.o. male here for 385-monthollow-up of hypertension, diabetes, dyslipidemia. Visual problems much better now that diabetes is better controlled. Still has some issues with chronic right elbow pain.  Meloxicam helps. Wt Readings from Last 3 Encounters:  02/05/23 257 lb 6 oz (116.7 kg)  11/05/22 256 lb (116.1 kg)  06/13/22 244 lb 12.8 oz (111 kg)      HPI   Prior to Admission medications   Medication Sig Start Date End Date Taking? Authorizing Provider  Armodafinil 50 MG tablet TAKE 2 TABLETS BY MOUTH EVERY DAY 11/06/22  Yes Olalere, Adewale A, MD  aspirin EC 81 MG EC tablet Take 1 tablet (81 mg total) by mouth daily. 04/02/20  Yes XuFlorencia ReasonsMD  atorvastatin (LIPITOR) 40 MG tablet TAKE 1 TABLET BY MOUTH EVERY DAY 11/02/22  Yes Kamri Gotsch, MiInes BloomerMD  dapagliflozin propanediol (FARXIGA) 10 MG TABS tablet Take 1 tablet (10 mg total) by mouth daily before breakfast. 11/05/22  Yes Alazar Cherian, MiInes BloomerMD  Dulaglutide (TRULICITY) 1.5 MG0000000OPN Inject 1.5 mg into the skin once a week. 11/05/22  Yes Dione Mccombie, MiInes BloomerMD  fluticasone (FLONASE) 50 MCG/ACT nasal spray SPRAY 2 SPRAYS INTO EACH NOSTRIL EVERY DAY Patient taking differently: Place 2 sprays into both nostrils daily. 01/11/21  Yes Just, KeLaurita QuintFNP  glucose blood test strip Use as instructed 06/13/22  Yes GrAilene ArdsNP  ibuprofen (ADVIL) 600 MG tablet Take 1 tablet (600 mg total) by mouth every 6 (six) hours as needed. 07/17/21  Yes TrDomenic MorasPA-C  Lancets ([facility]) lancets Use as instructed 06/13/22  Yes GrAilene ArdsNP  lisinopril (ZESTRIL) 20 MG tablet Take 1 tablet (20 mg total) by mouth daily. 11/05/22  Yes SaHorald PollenMD  metFORMIN (GLUCOPHAGE) 1000 MG tablet TAKE 1 TABLET BY MOUTH EVERY DAY WITH BREAKFAST 10/07/22  Yes Kishaun Erekson,  MiInes BloomerMD  metoprolol succinate (TOPROL-XL) 25 MG 24 hr tablet TAKE 1 TABLET BY MOUTH EVERY DAY 03/13/22  Yes Laurice Iglesia, MiInes BloomerMD    Allergies  Allergen Reactions   Doxycycline Hives   Erythromycin Base Hives   Glipizide Shortness Of Breath    Shakey    Armodafinil Palpitations    In high doses per patient.     Patient Active Problem List   Diagnosis Date Noted   Chronic right shoulder pain 04/27/2021   Erectile dysfunction 04/26/2021   Body mass index (BMI) of 40.1-44.9 in adult ([facility]2020   S/P arthroscopy of left shoulder 02/01/2020   Impingement syndrome of left shoulder 01/14/2020   Hypertension associated with diabetes ([facility] [place]10/11/2019   Morbid obesity ([facility]10/11/2019   Dyslipidemia 09/09/2018   Environmental allergies 09/09/2018   BMI 39.0-39.9,adult 09/03/2017   Dyslipidemia associated with type 2 diabetes mellitus ([facility]/19/2018   Anemia 04/14/2017   Hyperlipidemia 05/02/2016   OSA (obstructive sleep apnea) 09/06/2015   Benign essential HTN 09/06/2015    Past Medical History:  Diagnosis Date   Allergy    Diabetes mellitus without complication (HCC)    Elevated cholesterol    Hernia    bi lateral hernia repair   History of kidney stones    Hypertension    Sleep apnea [id number] severe   Umbilical hernia without obstruction and without gangrene  09/03/2017    Past Surgical History:  Procedure Laterality Date   CARPAL TUNNEL RELEASE Right 2007   CARPAL TUNNEL RELEASE Left 10/28/2014   Procedure: LEFT CARPAL TUNNEL RELEASE;  Surgeon: Charlotte Crumb, MD;  Location: [place];  Service: Orthopedics;  Laterality: Left;   FOOT SURGERY Left    INGUINAL HERNIA REPAIR     times two, each side   KIDNEY STONE SURGERY     MASS EXCISION Left 10/28/2014   Procedure: LEFT WRIST VOLAR MASS EXCISION;  Surgeon: Charlotte Crumb, MD;  Location: [place];  Service: Orthopedics;  Laterality: Left;   UMBILICAL HERNIA REPAIR  N/A 10/27/2017   Procedure: HERNIA REPAIR UMBILICAL ADULT;  Surgeon: Clovis Riley, MD;  Location: [facility] OR;  Service: General;  Laterality: N/A;    Social History   Socioeconomic History   Marital status: Married    Spouse name: AUSAR RIGLER   Number of children: 2   Years of education: Associates   Highest education level: Not on file  Occupational History   Occupation: Librarian, academic    Comment: [employer]  Tobacco Use   Smoking status: Never   Smokeless tobacco: Never  Vaping Use   Vaping Use: Never used  Substance and Sexual Activity   Alcohol use: No    Alcohol/week: 0.0 standard drinks of alcohol   Drug use: No   Sexual activity: Yes    Partners: Female    Birth control/protection: Condom  Other Topics Concern   Not on file  Social History Narrative   Lives with his wife and their 2 children.   He has 3 sons from a previous relationship that live independently.   Formerly in the [employer].   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   Social Determinants of Health   Financial Resource Strain: Not on file  Food Insecurity: Not on file  Transportation Needs: Not on file  Physical Activity: Not on file  Stress: Not on file  Social Connections: Not on file  Intimate Partner Violence: Not on file    Family History  Problem Relation Age of Onset   Diabetes Mother    Stroke Mother        4 strokes   Heart attack Mother    Heart disease Mother    Hypertension Father    Hypertension Sister    Heart disease Sister        pacemaker     Review of Systems  Constitutional: Negative.  Negative for chills and fever.  HENT: Negative.  Negative for congestion and sore throat.   Respiratory: Negative.  Negative for cough and shortness of breath.   Cardiovascular: Negative.  Negative for chest pain and palpitations.  Gastrointestinal:  Negative for abdominal pain, diarrhea, nausea  and vomiting.  Genitourinary: Negative.  Negative for dysuria and hematuria.  Skin: Negative.  Negative for rash.  Neurological: Negative.  Negative for dizziness and headaches.  All other systems reviewed and are negative.  Today's Vitals   02/05/23 0802  BP: 130/82  Pulse: 78  Temp: 98 F (36.7 C)  TempSrc: Oral  SpO2: 96%  Weight: 257 lb 6 oz (116.7 kg)  Height: 5' 7"$  (1.702 m)   Body mass index is 40.31 kg/m.   Physical Exam Constitutional:      Appearance: He is obese.  HENT:     Head: Normocephalic.  Eyes:  Extraocular Movements: Extraocular movements intact.  Cardiovascular:     Rate and Rhythm: Normal rate.  Pulmonary:     Effort: Pulmonary effort is normal.  Skin:    General: Skin is warm and dry.  Neurological:     Mental Status: He is alert and oriented to person, place, and time.  Psychiatric:        Mood and Affect: Mood normal.        Behavior: Behavior normal.    Results for orders placed or performed in visit on 02/05/23 (from the past 24 hour(s))  POCT HgB A1C     Status: Abnormal   Collection Time: 02/05/23  8:14 AM  Result Value Ref Range   Hemoglobin A1C 7.1 (A) 4.0 - 5.6 %   HbA1c POC (<> result, manual entry)     HbA1c, POC (prediabetic range)     HbA1c, POC (controlled diabetic range)       ASSESSMENT & PLAN: A total of 45 minutes was spent with the patient and counseling/coordination of care regarding preparing for this visit, review of most recent office visit notes, review of most recent blood work results including interpretation of today's hemoglobin A1c, review of all medications, education on nutrition, cardiovascular risks associated with hypertension diabetes and dyslipidemia, review of health maintenance items, prognosis, documentation and need for follow-up in 3 months  Problem List Items Addressed This Visit       Cardiovascular and Mediastinum   Hypertension associated with diabetes ([place]) - Primary    Well-controlled  hypertension. Continue metoprolol succinate 25 mg daily, lisinopril 20 mg daily Much improved diabetes with hemoglobin A1c of 7.1 Continue weekly Trulicity 1.5 mg, daily Farxiga 10 mg, and metformin 1000 mg once a day. Cardiovascular risks associated with hypertension and diabetes discussed Diet and nutrition discussed. Follow-up in 3 months.        Endocrine   Dyslipidemia associated with type 2 diabetes mellitus (HCC)    Chronic stable condition. Continue atorvastatin 40 mg daily. Diet and nutrition discussed.         Other   Morbid obesity ([place])    Feeling better but not loosing weight. Exercising more. Diet and nutrition discussed.      Other Visit Diagnoses     Type 2 diabetes mellitus without complication, unspecified whether long term insulin use (HCC)       Relevant Orders   POCT HgB A1C (Completed)      Patient Instructions  Diabetes Mellitus and Nutrition, Adult When you have diabetes, or diabetes mellitus, it is very important to have healthy eating habits because your blood sugar (glucose) levels are greatly affected by what you eat and drink. Eating healthy foods in the right amounts, at about the same times every day, can help you: Manage your blood glucose. Lower your risk of heart disease. Improve your blood pressure. Reach or maintain a healthy weight. What can affect my meal plan? Every person with diabetes is different, and each person has different needs for a meal plan. Your health care provider may recommend that you work with a dietitian to make a meal plan that is best for you. Your meal plan may vary depending on factors such as: The calories you need. The medicines you take. Your weight. Your blood glucose, blood pressure, and cholesterol levels. Your activity level. Other health conditions you have, such as heart or kidney disease. How do carbohydrates affect me? Carbohydrates, also called carbs, affect your blood glucose level more than  any  other type of food. Eating carbs raises the amount of glucose in your blood. It is important to know how many carbs you can safely have in each meal. This is different for every person. Your dietitian can help you calculate how many carbs you should have at each meal and for each snack. How does alcohol affect me? Alcohol can cause a decrease in blood glucose (hypoglycemia), especially if you use insulin or take certain diabetes medicines by mouth. Hypoglycemia can be a life-threatening condition. Symptoms of hypoglycemia, such as sleepiness, dizziness, and confusion, are similar to symptoms of having too much alcohol. Do not drink alcohol if: Your health care provider tells you not to drink. You are pregnant, may be pregnant, or are planning to become pregnant. If you drink alcohol: Limit how much you have to: 0-1 drink a day for women. 0-2 drinks a day for men. Know how much alcohol is in your drink. In the [place] one drink equals one 12 oz bottle of beer (355 mL), one 5 oz glass of wine (148 mL), or one 1 oz glass of hard liquor (44 mL). Keep yourself hydrated with water, diet soda, or unsweetened iced tea. Keep in mind that regular soda, juice, and other mixers may contain a lot of sugar and must be counted as carbs. What are tips for following this plan?  Reading food labels Start by checking the serving size on the Nutrition Facts label of packaged foods and drinks. The number of calories and the amount of carbs, fats, and other nutrients listed on the label are based on one serving of the item. Many items contain more than one serving per package. Check the total grams (g) of carbs in one serving. Check the number of grams of saturated fats and trans fats in one serving. Choose foods that have a low amount or none of these fats. Check the number of milligrams (mg) of salt (sodium) in one serving. Most people should limit total sodium intake to less than 2,300 mg per day. Always check  the nutrition information of foods labeled as "low-fat" or "nonfat." These foods may be higher in added sugar or refined carbs and should be avoided. Talk to your dietitian to identify your daily goals for nutrients listed on the label. Shopping Avoid buying canned, pre-made, or processed foods. These foods tend to be high in fat, sodium, and added sugar. Shop around the outside edge of the grocery store. This is where you will most often find fresh fruits and vegetables, bulk grains, fresh meats, and fresh dairy products. Cooking Use low-heat cooking methods, such as baking, instead of high-heat cooking methods, such as deep frying. Cook using healthy oils, such as olive, canola, or sunflower oil. Avoid cooking with butter, cream, or high-fat meats. Meal planning Eat meals and snacks regularly, preferably at the same times every day. Avoid going long periods of time without eating. Eat foods that are high in fiber, such as fresh fruits, vegetables, beans, and whole grains. Eat 4-6 oz (112-168 g) of lean protein each day, such as lean meat, chicken, fish, eggs, or tofu. One ounce (oz) (28 g) of lean protein is equal to: 1 oz (28 g) of meat, chicken, or fish. 1 egg.  cup (62 g) of tofu. Eat some foods each day that contain healthy fats, such as avocado, nuts, seeds, and fish. What foods should I eat? Fruits Berries. Apples. Oranges. Peaches. Apricots. Plums. Grapes. Mangoes. Papayas. Pomegranates. Kiwi. Cherries. Vegetables Leafy greens, including lettuce,  spinach, kale, chard, collard greens, mustard greens, and cabbage. Beets. Cauliflower. Broccoli. Carrots. Green beans. Tomatoes. Peppers. Onions. Cucumbers. Brussels sprouts. Grains Whole grains, such as whole-wheat or whole-grain bread, crackers, tortillas, cereal, and pasta. Unsweetened oatmeal. Quinoa. Brown or wild rice. Meats and other proteins Seafood. Poultry without skin. Lean cuts of poultry and beef. Tofu. Nuts.  Seeds. Dairy Low-fat or fat-free dairy products such as milk, yogurt, and cheese. The items listed above may not be a complete list of foods and beverages you can eat and drink. Contact a dietitian for more information. What foods should I avoid? Fruits Fruits canned with syrup. Vegetables Canned vegetables. Frozen vegetables with butter or cream sauce. Grains Refined white flour and flour products such as bread, pasta, snack foods, and cereals. Avoid all processed foods. Meats and other proteins Fatty cuts of meat. Poultry with skin. Breaded or fried meats. Processed meat. Avoid saturated fats. Dairy Full-fat yogurt, cheese, or milk. Beverages Sweetened drinks, such as soda or iced tea. The items listed above may not be a complete list of foods and beverages you should avoid. Contact a dietitian for more information. Questions to ask a health care provider Do I need to meet with a certified diabetes care and education specialist? Do I need to meet with a dietitian? What number can I call if I have questions? When are the best times to check my blood glucose? Where to find more information: American Diabetes Association: diabetes.org Academy of Nutrition and Dietetics: eatright.[employer] of Diabetes and Digestive and Kidney Diseases: AmenCredit.is Association of Diabetes Care & Education Specialists: diabeteseducator.org Summary It is important to have healthy eating habits because your blood sugar (glucose) levels are greatly affected by what you eat and drink. It is important to use alcohol carefully. A healthy meal plan will help you manage your blood glucose and lower your risk of heart disease. Your health care provider may recommend that you work with a dietitian to make a meal plan that is best for you. This information is not intended to replace advice given to you by your health care provider. Make sure you discuss any questions you have with your health care  provider. Document Revised: 07/05/2020 Document Reviewed: 07/05/2020 Elsevier Patient Education  Bernard, MD [facility]

## 2023-02-05 NOTE — Assessment & Plan Note (Signed)
Well-controlled hypertension. Continue metoprolol succinate 25 mg daily, lisinopril 20 mg daily Much improved diabetes with hemoglobin A1c of 7.1 Continue weekly Trulicity 1.5 mg, daily Farxiga 10 mg, and metformin 1000 mg once a day. Cardiovascular risks associated with hypertension and diabetes discussed Diet and nutrition discussed. Follow-up in 3 months.

## 2023-02-05 NOTE — Assessment & Plan Note (Signed)
Chronic stable condition. Continue atorvastatin 40 mg daily. Diet and nutrition discussed.

## 2023-02-19 ENCOUNTER — Other Ambulatory Visit: Payer: Self-pay | Admitting: Emergency Medicine

## 2023-02-27 ENCOUNTER — Telehealth (INDEPENDENT_AMBULATORY_CARE_PROVIDER_SITE_OTHER): Payer: BC Managed Care – PPO | Admitting: Emergency Medicine

## 2023-02-27 ENCOUNTER — Encounter: Payer: Self-pay | Admitting: Emergency Medicine

## 2023-02-27 DIAGNOSIS — E1159 Type 2 diabetes mellitus with other circulatory complications: Secondary | ICD-10-CM | POA: Diagnosis not present

## 2023-02-27 DIAGNOSIS — I152 Hypertension secondary to endocrine disorders: Secondary | ICD-10-CM | POA: Diagnosis not present

## 2023-02-27 MED ORDER — TRULICITY 3 MG/0.5ML ~~LOC~~ SOAJ: 3.0000 mg | SUBCUTANEOUS | 5 refills | Status: DC

## 2023-02-27 NOTE — Progress Notes (Signed)
Telemedicine Encounter- SOAP NOTE Established Patient MyChart video conference Patient: Home  Provider: Office   Patient present only  This video encounter was conducted with the patient's (or proxy's) verbal consent via video telecommunications: yes/no: Yes Patient was instructed to have this encounter in a suitably private space; and to only have persons present to whom they give permission to participate. In addition, patient identity was confirmed by use of name plus two identifiers (DOB and address).  I discussed the limitations, risks, security and privacy concerns of performing an evaluation and management service by telephone and the availability of in person appointments. I also discussed with the patient that there may be a patient responsible charge related to this service. The patient expressed understanding and agreed to proceed.    Chief complaint: Follow-up of diabetes and medication concern  Subjective   Walter Reynolds. is a 52 y.o. male established patient. Telephone visit today concerned because Trulicity not available. Inquiring about a higher dose. No other complaints or medical concerns today.  HPI   Patient Active Problem List   Diagnosis Date Noted   Blurry vision, bilateral 11/12/2022   Chronic right shoulder pain 04/27/2021   Erectile dysfunction 04/26/2021   Body mass index (BMI) of 40.1-44.9 in adult [facility]) 07/17/2020   S/P arthroscopy of left shoulder 02/01/2020   Impingement syndrome of left shoulder 01/14/2020   Hypertension associated with diabetes ([place]) 09/27/2019   Morbid obesity ([place]) 09/27/2019   Dyslipidemia 09/09/2018   Environmental allergies 09/09/2018   BMI 39.0-39.9,adult 09/03/2017   Dyslipidemia associated with type 2 diabetes mellitus ([place]) 09/03/2017   Anemia 04/14/2017   Hyperlipidemia 05/02/2016   OSA (obstructive sleep apnea) 09/06/2015   Benign essential HTN 09/06/2015    Past Medical History:  Diagnosis Date    Allergy    Diabetes mellitus without complication (HCC)    Elevated cholesterol    Hernia    bi lateral hernia repair   History of kidney stones    Hypertension    Sleep apnea [id number]   severe   Umbilical hernia without obstruction and without gangrene 09/03/2017    Current Outpatient Medications  Medication Sig Dispense Refill   Dulaglutide (TRULICITY) 3 0000000 SOPN Inject 3 mg as directed once a week. 2 mL 5   Armodafinil 50 MG tablet TAKE 2 TABLETS BY MOUTH EVERY DAY 60 tablet 3   aspirin EC 81 MG EC tablet Take 1 tablet (81 mg total) by mouth daily. 30 tablet 0   atorvastatin (LIPITOR) 40 MG tablet TAKE 1 TABLET BY MOUTH EVERY DAY 90 tablet 3   dapagliflozin propanediol (FARXIGA) 10 MG TABS tablet Take 1 tablet (10 mg total) by mouth daily before breakfast. 90 tablet 3   fluticasone (FLONASE) 50 MCG/ACT nasal spray SPRAY 2 SPRAYS INTO EACH NOSTRIL EVERY DAY (Patient taking differently: Place 2 sprays into both nostrils daily.) 48 mL 12   glucose blood test strip Use as instructed 100 each 12   ibuprofen (ADVIL) 600 MG tablet Take 1 tablet (600 mg total) by mouth every 6 (six) hours as needed. 30 tablet 0   Lancets (ONETOUCH ULTRASOFT) lancets Use as instructed 100 each 12   lisinopril (ZESTRIL) 20 MG tablet Take 1 tablet (20 mg total) by mouth daily. 90 tablet 1   metFORMIN (GLUCOPHAGE) 1000 MG tablet TAKE 1 TABLET BY MOUTH EVERY DAY WITH BREAKFAST 90 tablet 1   metoprolol succinate (TOPROL-XL) 25 MG 24 hr tablet TAKE 1 TABLET BY MOUTH EVERY DAY 90  tablet 3   No current facility-administered medications for this visit.    Allergies  Allergen Reactions   Doxycycline Hives   Erythromycin Base Hives   Glipizide Shortness Of Breath    Shakey    Armodafinil Palpitations    In high doses per patient.     Social History   Socioeconomic History   Marital status: Married    Spouse name: HEMI SCHULENBURG   Number of children: 2   Years of education: Associates   Highest  education level: Not on file  Occupational History   Occupation: Librarian, academic    Comment: [employer]  Tobacco Use   Smoking status: Never   Smokeless tobacco: Never  Vaping Use   Vaping Use: Never used  Substance and Sexual Activity   Alcohol use: No    Alcohol/week: 0.0 standard drinks of alcohol   Drug use: No   Sexual activity: Yes    Partners: Female    Birth control/protection: Condom  Other Topics Concern   Not on file  Social History Narrative   Lives with his wife and their 2 children.   He has 3 sons from a previous relationship that live independently.   Formerly in the [employer].   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   Social Determinants of Health   Financial Resource Strain: Not on file  Food Insecurity: Not on file  Transportation Needs: Not on file  Physical Activity: Not on file  Stress: Not on file  Social Connections: Not on file  Intimate Partner Violence: Not on file    Review of Systems  Constitutional: Negative.  Negative for chills and fever.  HENT: Negative.  Negative for congestion and sore throat.   Respiratory: Negative.  Negative for cough and shortness of breath.   Cardiovascular: Negative.  Negative for chest pain and palpitations.  Gastrointestinal:  Negative for abdominal pain, nausea and vomiting.  Skin: Negative.  Negative for rash.  Neurological: Negative.  Negative for dizziness and headaches.  All other systems reviewed and are negative.   Objective   Vitals as reported by the patient: There were no vitals filed for this visit.  Problem List Items Addressed This Visit       Cardiovascular and Mediastinum   Hypertension associated with diabetes ([place]) - Primary    Well-controlled hypertension. Continue lisinopril 20 mg daily and metoprolol succinate 25 mg daily Well-controlled diabetes with hemoglobin A1c at 7.1 Lab Results  Component  Value Date   HGBA1C 7.1 (A) 02/05/2023  Continue metformin 1000 mg twice a day and Farxiga 10 mg daily Okay to increase dose of Trulicity to 3 mg weekly Cardiovascular risks associated with hypertension and diabetes discussed Diet and nutrition discussed.       Relevant Medications   Dulaglutide (TRULICITY) 3 0000000 SOPN      I discussed the assessment and treatment plan with the patient. The patient was provided an opportunity to ask questions and all were answered. The patient agreed with the plan and demonstrated an understanding of the instructions.   The patient was advised to call back or seek an in-person evaluation if the symptoms worsen or if the condition fails to improve as anticipated.   Horald Pollen, MD  Primary Care at [facility]

## 2023-02-27 NOTE — Assessment & Plan Note (Signed)
Well-controlled hypertension. Continue lisinopril 20 mg daily and metoprolol succinate 25 mg daily Well-controlled diabetes with hemoglobin A1c at 7.1 Lab Results  Component Value Date   HGBA1C 7.1 (A) 02/05/2023  Continue metformin 1000 mg twice a day and Farxiga 10 mg daily Okay to increase dose of Trulicity to 3 mg weekly Cardiovascular risks associated with hypertension and diabetes discussed Diet and nutrition discussed.

## 2023-04-29 ENCOUNTER — Other Ambulatory Visit: Payer: Self-pay | Admitting: Emergency Medicine

## 2023-04-29 DIAGNOSIS — E1159 Type 2 diabetes mellitus with other circulatory complications: Secondary | ICD-10-CM

## 2023-04-30 ENCOUNTER — Other Ambulatory Visit: Payer: Self-pay | Admitting: Emergency Medicine

## 2023-04-30 DIAGNOSIS — E1169 Type 2 diabetes mellitus with other specified complication: Secondary | ICD-10-CM

## 2023-04-30 DIAGNOSIS — E1159 Type 2 diabetes mellitus with other circulatory complications: Secondary | ICD-10-CM

## 2023-05-14 LAB — COLOGUARD: COLOGUARD: NEGATIVE

## 2023-06-24 ENCOUNTER — Ambulatory Visit: Payer: BC Managed Care – PPO | Admitting: Emergency Medicine

## 2023-06-24 ENCOUNTER — Encounter: Payer: Self-pay | Admitting: Emergency Medicine

## 2023-06-24 VITALS — BP 136/88 | HR 82 | Temp 98.1°F | Ht 67.0 in | Wt 247.1 lb

## 2023-06-24 DIAGNOSIS — E1159 Type 2 diabetes mellitus with other circulatory complications: Secondary | ICD-10-CM

## 2023-06-24 DIAGNOSIS — E1169 Type 2 diabetes mellitus with other specified complication: Secondary | ICD-10-CM | POA: Diagnosis not present

## 2023-06-24 DIAGNOSIS — N529 Male erectile dysfunction, unspecified: Secondary | ICD-10-CM

## 2023-06-24 DIAGNOSIS — G4733 Obstructive sleep apnea (adult) (pediatric): Secondary | ICD-10-CM

## 2023-06-24 DIAGNOSIS — I152 Hypertension secondary to endocrine disorders: Secondary | ICD-10-CM

## 2023-06-24 DIAGNOSIS — E785 Hyperlipidemia, unspecified: Secondary | ICD-10-CM

## 2023-06-24 DIAGNOSIS — F32A Depression, unspecified: Secondary | ICD-10-CM

## 2023-06-24 DIAGNOSIS — Z7984 Long term (current) use of oral hypoglycemic drugs: Secondary | ICD-10-CM

## 2023-06-24 LAB — COMPREHENSIVE METABOLIC PANEL
ALT: 23 U/L (ref 0–53)
AST: 18 U/L (ref 0–37)
Albumin: 4.6 g/dL (ref 3.5–5.2)
Alkaline Phosphatase: 89 U/L (ref 39–117)
BUN: 13 mg/dL (ref 6–23)
CO2: 27 mEq/L (ref 19–32)
Calcium: 9.8 mg/dL (ref 8.4–10.5)
Chloride: 103 mEq/L (ref 96–112)
Creatinine, Ser: 0.94 mg/dL (ref 0.40–1.50)
GFR: 92.59 mL/min (ref >60.00)
Glucose, Bld: 85 mg/dL (ref 70–99)
Potassium: 4.5 mEq/L (ref 3.5–5.1)
Sodium: 139 mEq/L (ref 135–145)
Total Bilirubin: 0.5 mg/dL (ref 0.2–1.2)
Total Protein: 7.4 g/dL (ref 6.0–8.3)

## 2023-06-24 LAB — CBC WITH DIFFERENTIAL/PLATELET
Basophils Absolute: 0.1 10*3/uL (ref 0.0–0.1)
Basophils Relative: 0.7 % (ref 0.0–3.0)
Eosinophils Absolute: 0.2 10*3/uL (ref 0.0–0.7)
Eosinophils Relative: 2.8 % (ref 0.0–5.0)
HCT: 44.2 % (ref 39.0–52.0)
Hemoglobin: 13.7 g/dL (ref 13.0–17.0)
Lymphocytes Relative: 34.2 % (ref 12.0–46.0)
Lymphs Abs: 2.7 10*3/uL (ref 0.7–4.0)
MCHC: 30.9 g/dL (ref 30.0–36.0)
MCV: 74.7 fl — ABNORMAL LOW (ref 78.0–100.0)
Monocytes Absolute: 0.7 10*3/uL (ref 0.1–1.0)
Monocytes Relative: 8.8 % (ref 3.0–12.0)
Neutro Abs: 4.3 10*3/uL (ref 1.4–7.7)
Neutrophils Relative %: 53.5 % (ref 43.0–77.0)
Platelets: 302 10*3/uL (ref 150.0–400.0)
RBC: 5.92 Mil/uL — ABNORMAL HIGH (ref 4.22–5.81)
RDW: 16 % — ABNORMAL HIGH (ref 11.5–15.5)
WBC: 8 10*3/uL (ref 4.0–10.5)

## 2023-06-24 LAB — POCT GLYCOSYLATED HEMOGLOBIN (HGB A1C): Hemoglobin A1C: 5.9 % — AB (ref 4.0–5.6)

## 2023-06-24 LAB — LIPID PANEL
Cholesterol: 120 mg/dL (ref 0–200)
HDL: 24.7 mg/dL — ABNORMAL LOW (ref >39.00)
LDL Cholesterol: 62 mg/dL (ref 0–99)
NonHDL: 94.97
Total CHOL/HDL Ratio: 5
Triglycerides: 163 mg/dL — ABNORMAL HIGH (ref 0.0–149.0)
VLDL: 32.6 mg/dL (ref 0.0–40.0)

## 2023-06-24 LAB — PSA: PSA: 0.62 ng/mL (ref 0.10–4.00)

## 2023-06-24 LAB — VITAMIN D 25 HYDROXY (VIT D DEFICIENCY, FRACTURES): VITD: 27.21 ng/mL — ABNORMAL LOW (ref 30.00–100.00)

## 2023-06-24 LAB — TESTOSTERONE: Testosterone: 229.52 ng/dL — ABNORMAL LOW (ref 300.00–890.00)

## 2023-06-24 NOTE — Assessment & Plan Note (Signed)
Chronic depression with anxiety component Affecting quality of life Recommend psychiatric evaluation Referral placed today

## 2023-06-24 NOTE — Patient Instructions (Signed)

## 2023-06-24 NOTE — Assessment & Plan Note (Signed)
Stable on CPAP treatment. 

## 2023-06-24 NOTE — Assessment & Plan Note (Signed)
Diet and nutrition discussed. Benefits of exercise discussed. Advised to decrease amount of daily carbohydrate intake and daily calories and increase amount of plant-based protein in his diet 

## 2023-06-24 NOTE — Progress Notes (Signed)
Walter Reynolds. 52 y.o.   Chief Complaint  Patient presents with   Shortness of Breath    Patient states he has been having some SOB and dizziness patient states when he bends over he gets dizzy. Patient states he works out in the heat. SOB going on for about a few weeks.  Patient has a CPAP    Labs Only    Patient wants labs done as well, vitamin d checked as well     HISTORY OF PRESENT ILLNESS: This is a 52 y.o. male complaining of feeling dizzy mostly when he bends over for several months History of diabetes on Trulicity and Farxiga History of OSA on CPAP Eating better Spoke to wife who brought up issues of depression and anxiety Patient has history of chronic depression.  Needs psych referral Also very concerned about erectile dysfunction.  Unresponsive to usual medications Wt Readings from Last 3 Encounters:  06/24/23 247 lb 2 oz (112.1 kg)  02/05/23 257 lb 6 oz (116.7 kg)  11/05/22 256 lb (116.1 kg)     Shortness of Breath Pertinent negatives include no abdominal pain, chest pain, fever, headaches, rash, sore throat or vomiting.     Prior to Admission medications   Medication Sig Start Date End Date Taking? Authorizing Provider  aspirin EC 81 MG EC tablet Take 1 tablet (81 mg total) by mouth daily. 04/02/20  Yes Albertine Grates, MD  atorvastatin (LIPITOR) 40 MG tablet TAKE 1 TABLET BY MOUTH EVERY DAY 11/02/22  Yes Tehani Mersman, Eilleen Kempf, MD  dapagliflozin propanediol (FARXIGA) 10 MG TABS tablet Take 1 tablet (10 mg total) by mouth daily before breakfast. 11/05/22  Yes Maley Venezia, Eilleen Kempf, MD  Dulaglutide (TRULICITY) 3 MG/0.5ML SOPN Inject 3 mg as directed once a week. 02/27/23  Yes Isadore Bokhari, Eilleen Kempf, MD  fluticasone (FLONASE) 50 MCG/ACT nasal spray SPRAY 2 SPRAYS INTO EACH NOSTRIL EVERY DAY Patient taking differently: Place 2 sprays into both nostrils daily. 01/11/21  Yes Just, Azalee Course, FNP  glucose blood test strip Use as instructed 06/13/22  Yes Elenore Paddy, NP   Lancets [facility] ULTRASOFT) lancets Use as instructed 06/13/22  Yes Elenore Paddy, NP  lisinopril (ZESTRIL) 20 MG tablet TAKE 1 TABLET BY MOUTH EVERY DAY 04/30/23  Yes Georgina Quint, MD  metFORMIN (GLUCOPHAGE) 1000 MG tablet TAKE 1 TABLET (1,000 MG TOTAL) BY MOUTH TWICE A DAY WITH FOOD 04/29/23  Yes Ardean Melroy, Eilleen Kempf, MD  metoprolol succinate (TOPROL-XL) 25 MG 24 hr tablet TAKE 1 TABLET BY MOUTH EVERY DAY 02/19/23  Yes Charlee Squibb, Eilleen Kempf, MD  Armodafinil 50 MG tablet TAKE 2 TABLETS BY MOUTH EVERY DAY 11/06/22   Tomma Lightning, MD    Allergies  Allergen Reactions   Doxycycline Hives   Erythromycin Base Hives   Glipizide Shortness Of Breath    Shakey    Armodafinil Palpitations    In high doses per patient.     Patient Active Problem List   Diagnosis Date Noted   Blurry vision, bilateral 11/12/2022   Chronic right shoulder pain 04/27/2021   Erectile dysfunction 04/26/2021   Body mass index (BMI) of 40.1-44.9 in adult [facility]) 07/17/2020   S/P arthroscopy of left shoulder 02/01/2020   Impingement syndrome of left shoulder 01/14/2020   Hypertension associated with diabetes (HCC) 09/27/2019   Morbid obesity (HCC) 09/27/2019   Dyslipidemia 09/09/2018   Environmental allergies 09/09/2018   BMI 39.0-39.9,adult 09/03/2017   Dyslipidemia associated with type 2 diabetes mellitus (HCC) 09/03/2017  Anemia 04/14/2017   Hyperlipidemia 05/02/2016   OSA (obstructive sleep apnea) 09/06/2015   Benign essential HTN 09/06/2015    Past Medical History:  Diagnosis Date   Allergy    Diabetes mellitus without complication (HCC)    Elevated cholesterol    Hernia    bi lateral hernia repair   History of kidney stones    Hypertension    Sleep apnea 2014   severe   Umbilical hernia without obstruction and without gangrene 09/03/2017    Past Surgical History:  Procedure Laterality Date   CARPAL TUNNEL RELEASE Right 2007   CARPAL TUNNEL RELEASE Left 10/28/2014   Procedure: LEFT  CARPAL TUNNEL RELEASE;  Surgeon: Dairl Ponder, MD;  Location: [facility];  Service: Orthopedics;  Laterality: Left;   FOOT SURGERY Left    INGUINAL HERNIA REPAIR     times two, each side   KIDNEY STONE SURGERY     MASS EXCISION Left 10/28/2014   Procedure: LEFT WRIST VOLAR MASS EXCISION;  Surgeon: Dairl Ponder, MD;  Location: [facility];  Service: Orthopedics;  Laterality: Left;   UMBILICAL HERNIA REPAIR N/A 10/27/2017   Procedure: HERNIA REPAIR UMBILICAL ADULT;  Surgeon: Berna Bue, MD;  Location: [facility] OR;  Service: General;  Laterality: N/A;    Social History   Socioeconomic History   Marital status: Married    Spouse name: Anne Ng   Number of children: 2   Years of education: Associates   Highest education level: Associate degree: occupational, Scientist, product/process development, or vocational program  Occupational History   Occupation: Merchandiser, retail    Comment: [employer]  Tobacco Use   Smoking status: Never   Smokeless tobacco: Never  Vaping Use   Vaping Use: Never used  Substance and Sexual Activity   Alcohol use: No    Alcohol/week: 0.0 standard drinks of alcohol   Drug use: No   Sexual activity: Yes    Partners: Female    Birth control/protection: Condom  Other Topics Concern   Not on file  Social History Narrative   Lives with his wife and their 2 children.   He has 3 sons from a previous relationship that live independently.   Formerly in the [employer].   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   Social Determinants of Health   Financial Resource Strain: Low Risk  (06/22/2023)   Overall Financial Resource Strain (CARDIA)    Difficulty of Paying Living Expenses: Not hard at all  Food Insecurity: No Food Insecurity (06/22/2023)   Hunger Vital Sign    Worried About Running Out of Food in the Last Year: Never true    Ran Out of Food in the Last Year: Never  true  Transportation Needs: No Transportation Needs (06/22/2023)   PRAPARE - Administrator, Civil Service (Medical): No    Lack of Transportation (Non-Medical): No  Physical Activity: Insufficiently Active (06/22/2023)   Exercise Vital Sign    Days of Exercise per Week: 1 day    Minutes of Exercise per Session: 30 min  Stress: Stress Concern Present (06/22/2023)   [employer] of Occupational Health - Occupational Stress Questionnaire    Feeling of Stress : Rather much  Social Connections: Moderately Integrated (06/22/2023)   Social Connection and Isolation Panel [NHANES]    Frequency of Communication with Friends and Family: Three times a week    Frequency of Social Gatherings with  Friends and Family: Twice a week    Attends Religious Services: More than 4 times per year    Active Member of [employer] or Organizations: No    Attends Engineer, structural: Not on file    Marital Status: Married  Catering manager Violence: Not on file    Family History  Problem Relation Age of Onset   Diabetes Mother    Stroke Mother        4 strokes   Heart attack Mother    Heart disease Mother    Hypertension Father    Hypertension Sister    Heart disease Sister        pacemaker     Review of Systems  Constitutional: Negative.  Negative for chills and fever.  HENT: Negative.  Negative for congestion and sore throat.   Respiratory:  Positive for shortness of breath. Negative for cough.   Cardiovascular: Negative.  Negative for chest pain and palpitations.  Gastrointestinal:  Negative for abdominal pain, nausea and vomiting.  Genitourinary: Negative.  Negative for dysuria and hematuria.       Erectile dysfunction  Skin: Negative.  Negative for rash.  Neurological: Negative.  Negative for dizziness and headaches.  All other systems reviewed and are negative.   Vitals:   06/24/23 1353  BP: 136/88  Pulse: 82  Temp: 98.1 F (36.7 C)  SpO2: 97%    Physical  Exam Constitutional:      Appearance: Normal appearance. He is well-developed. He is obese.  HENT:     Head: Normocephalic.     Mouth/Throat:     Mouth: Mucous membranes are moist.     Pharynx: Oropharynx is clear.  Eyes:     Extraocular Movements: Extraocular movements intact.     Conjunctiva/sclera: Conjunctivae normal.     Pupils: Pupils are equal, round, and reactive to light.  Cardiovascular:     Rate and Rhythm: Normal rate and regular rhythm.     Pulses: Normal pulses.     Heart sounds: Normal heart sounds.  Pulmonary:     Effort: Pulmonary effort is normal.     Breath sounds: Normal breath sounds.  Abdominal:     Palpations: Abdomen is soft.     Tenderness: There is no abdominal tenderness.  Musculoskeletal:     Cervical back: No tenderness.  Lymphadenopathy:     Cervical: No cervical adenopathy.  Skin:    General: Skin is warm and dry.     Capillary Refill: Capillary refill takes less than 2 seconds.  Neurological:     General: No focal deficit present.     Mental Status: He is alert and oriented to person, place, and time.  Psychiatric:        Mood and Affect: Mood normal.        Behavior: Behavior normal.    Results for orders placed or performed in visit on 06/24/23 (from the past 24 hour(s))  POCT HgB A1C     Status: Abnormal   Collection Time: 06/24/23  3:39 PM  Result Value Ref Range   Hemoglobin A1C 5.9 (A) 4.0 - 5.6 %   HbA1c POC (<> result, manual entry)     HbA1c, POC (prediabetic range)     HbA1c, POC (controlled diabetic range)       ASSESSMENT & PLAN: A total of 45 minutes was spent with the patient and counseling/coordination of care regarding preparing for this visit, review of most recent office visit notes, review of most recent  blood work results including interpretation of today's hemoglobin A1c, review of multiple chronic medical conditions under management, review of all medications, education on nutrition, cardiovascular risks associated  with diabetes and hypertension, review of health maintenance items, prognosis, documentation, and need for follow-up  Problem List Items Addressed This Visit       Cardiovascular and Mediastinum   Hypertension associated with diabetes (HCC) - Primary    BP Readings from Last 3 Encounters:  06/24/23 136/88  02/05/23 130/82  11/05/22 134/82  Well-controlled hypertension Continue lisinopril 20 mg daily and metoprolol succinate 25 mg daily Well-controlled diabetes with hemoglobin A1c of 5.9 Continue Farxiga 10 mg daily and weekly Trulicity 3 mg Cardiovascular risks associated with hypertension and diabetes discussed Diet and nutrition discussed Will follow-up in 3 months       Relevant Orders   Testosterone   Comprehensive metabolic panel   Lipid panel   CBC with Differential/Platelet   VITAMIN D 25 Hydroxy (Vit-D Deficiency, Fractures)     Respiratory   OSA (obstructive sleep apnea)    Stable on CPAP treatment        Endocrine   Dyslipidemia associated with type 2 diabetes mellitus (HCC)    Chronic stable conditions Continue atorvastatin 40 mg daily        Other   Morbid obesity (HCC)    Diet and nutrition discussed Benefits of exercise discussed Advised to decrease amount of daily carbohydrate intake and daily calories and increase amount of plant-based protein in his diet      Erectile dysfunction    Active and affecting quality of life Medications not helping Recommend testosterone checked today Needs urology evaluation Referral placed today.      Relevant Orders   Ambulatory referral to Urology   Testosterone   PSA   Chronic depression    Chronic depression with anxiety component Affecting quality of life Recommend psychiatric evaluation Referral placed today      Relevant Orders   Ambulatory referral to Psychiatry   Patient Instructions  Diabetes Mellitus and Nutrition, Adult When you have diabetes, or diabetes mellitus, it is very important  to have healthy eating habits because your blood sugar (glucose) levels are greatly affected by what you eat and drink. Eating healthy foods in the right amounts, at about the same times every day, can help you: Manage your blood glucose. Lower your risk of heart disease. Improve your blood pressure. Reach or maintain a healthy weight. What can affect my meal plan? Every person with diabetes is different, and each person has different needs for a meal plan. Your health care provider may recommend that you work with a dietitian to make a meal plan that is best for you. Your meal plan may vary depending on factors such as: The calories you need. The medicines you take. Your weight. Your blood glucose, blood pressure, and cholesterol levels. Your activity level. Other health conditions you have, such as heart or kidney disease. How do carbohydrates affect me? Carbohydrates, also called carbs, affect your blood glucose level more than any other type of food. Eating carbs raises the amount of glucose in your blood. It is important to know how many carbs you can safely have in each meal. This is different for every person. Your dietitian can help you calculate how many carbs you should have at each meal and for each snack. How does alcohol affect me? Alcohol can cause a decrease in blood glucose (hypoglycemia), especially if you use insulin or take certain  diabetes medicines by mouth. Hypoglycemia can be a life-threatening condition. Symptoms of hypoglycemia, such as sleepiness, dizziness, and confusion, are similar to symptoms of having too much alcohol. Do not drink alcohol if: Your health care provider tells you not to drink. You are pregnant, may be pregnant, or are planning to become pregnant. If you drink alcohol: Limit how much you have to: 0-1 drink a day for women. 0-2 drinks a day for men. Know how much alcohol is in your drink. In the [place] one drink equals one 12 oz bottle of beer (355  mL), one 5 oz glass of wine (148 mL), or one 1 oz glass of hard liquor (44 mL). Keep yourself hydrated with water, diet soda, or unsweetened iced tea. Keep in mind that regular soda, juice, and other mixers may contain a lot of sugar and must be counted as carbs. What are tips for following this plan?  Reading food labels Start by checking the serving size on the Nutrition Facts label of packaged foods and drinks. The number of calories and the amount of carbs, fats, and other nutrients listed on the label are based on one serving of the item. Many items contain more than one serving per package. Check the total grams (g) of carbs in one serving. Check the number of grams of saturated fats and trans fats in one serving. Choose foods that have a low amount or none of these fats. Check the number of milligrams (mg) of salt (sodium) in one serving. Most people should limit total sodium intake to less than 2,300 mg per day. Always check the nutrition information of foods labeled as "low-fat" or "nonfat." These foods may be higher in added sugar or refined carbs and should be avoided. Talk to your dietitian to identify your daily goals for nutrients listed on the label. Shopping Avoid buying canned, pre-made, or processed foods. These foods tend to be high in fat, sodium, and added sugar. Shop around the outside edge of the grocery store. This is where you will most often find fresh fruits and vegetables, bulk grains, fresh meats, and fresh dairy products. Cooking Use low-heat cooking methods, such as baking, instead of high-heat cooking methods, such as deep frying. Cook using healthy oils, such as olive, canola, or sunflower oil. Avoid cooking with butter, cream, or high-fat meats. Meal planning Eat meals and snacks regularly, preferably at the same times every day. Avoid going long periods of time without eating. Eat foods that are high in fiber, such as fresh fruits, vegetables, beans, and whole  grains. Eat 4-6 oz (112-168 g) of lean protein each day, such as lean meat, chicken, fish, eggs, or tofu. One ounce (oz) (28 g) of lean protein is equal to: 1 oz (28 g) of meat, chicken, or fish. 1 egg.  cup (62 g) of tofu. Eat some foods each day that contain healthy fats, such as avocado, nuts, seeds, and fish. What foods should I eat? Fruits Berries. Apples. Oranges. Peaches. Apricots. Plums. Grapes. Mangoes. Papayas. Pomegranates. Kiwi. Cherries. Vegetables Leafy greens, including lettuce, spinach, kale, chard, collard greens, mustard greens, and cabbage. Beets. Cauliflower. Broccoli. Carrots. Green beans. Tomatoes. Peppers. Onions. Cucumbers. Brussels sprouts. Grains Whole grains, such as whole-wheat or whole-grain bread, crackers, tortillas, cereal, and pasta. Unsweetened oatmeal. Quinoa. Brown or wild rice. Meats and other proteins Seafood. Poultry without skin. Lean cuts of poultry and beef. Tofu. Nuts. Seeds. Dairy Low-fat or fat-free dairy products such as milk, yogurt, and cheese. The items listed above  may not be a complete list of foods and beverages you can eat and drink. Contact a dietitian for more information. What foods should I avoid? Fruits Fruits canned with syrup. Vegetables Canned vegetables. Frozen vegetables with butter or cream sauce. Grains Refined white flour and flour products such as bread, pasta, snack foods, and cereals. Avoid all processed foods. Meats and other proteins Fatty cuts of meat. Poultry with skin. Breaded or fried meats. Processed meat. Avoid saturated fats. Dairy Full-fat yogurt, cheese, or milk. Beverages Sweetened drinks, such as soda or iced tea. The items listed above may not be a complete list of foods and beverages you should avoid. Contact a dietitian for more information. Questions to ask a health care provider Do I need to meet with a certified diabetes care and education specialist? Do I need to meet with a dietitian? What  number can I call if I have questions? When are the best times to check my blood glucose? Where to find more information: American Diabetes Association: diabetes.org Academy of Nutrition and Dietetics: eatright.[employer] of Diabetes and Digestive and Kidney Diseases: StageSync.si Association of Diabetes Care & Education Specialists: diabeteseducator.org Summary It is important to have healthy eating habits because your blood sugar (glucose) levels are greatly affected by what you eat and drink. It is important to use alcohol carefully. A healthy meal plan will help you manage your blood glucose and lower your risk of heart disease. Your health care provider may recommend that you work with a dietitian to make a meal plan that is best for you. This information is not intended to replace advice given to you by your health care provider. Make sure you discuss any questions you have with your health care provider. Document Revised: 07/05/2020 Document Reviewed: 07/05/2020 Elsevier Patient Education  2024 Elsevier Inc.     Edwina Barth, MD [facility]

## 2023-06-24 NOTE — Assessment & Plan Note (Signed)
Active and affecting quality of life Medications not helping Recommend testosterone checked today Needs urology evaluation Referral placed today.

## 2023-06-24 NOTE — Assessment & Plan Note (Signed)
Chronic stable conditions Continue atorvastatin 40 mg daily

## 2023-06-24 NOTE — Assessment & Plan Note (Signed)
BP Readings from Last 3 Encounters:  06/24/23 136/88  02/05/23 130/82  11/05/22 134/82  Well-controlled hypertension Continue lisinopril 20 mg daily and metoprolol succinate 25 mg daily Well-controlled diabetes with hemoglobin A1c of 5.9 Continue Farxiga 10 mg daily and weekly Trulicity 3 mg Cardiovascular risks associated with hypertension and diabetes discussed Diet and nutrition discussed Will follow-up in 3 months

## 2023-06-25 ENCOUNTER — Other Ambulatory Visit: Payer: Self-pay | Admitting: Emergency Medicine

## 2023-06-25 DIAGNOSIS — E291 Testicular hypofunction: Secondary | ICD-10-CM

## 2023-06-25 MED ORDER — XYOSTED 75 MG/0.5ML ~~LOC~~ SOAJ
75.0000 mg | SUBCUTANEOUS | 5 refills | Status: DC
Start: 2023-06-25 — End: 2024-01-22

## 2023-06-26 IMAGING — CT CT HEAD W/O CM
4 series · 16 of 47 positions shown, 18 images · non-contrast
Comparison: None.

CLINICAL DATA: Headache with nausea and vomiting

EXAM:
CT HEAD WITHOUT CONTRAST
TECHNIQUE: Contiguous axial images were obtained from the base of the skull
through the vertex without intravenous contrast.

[Series 3: head wo · axial · 0.39mm/px · z∈[+1397,+1515]mm · 6 of 34 slices shown, 8 images]
[im 5/34  brain]
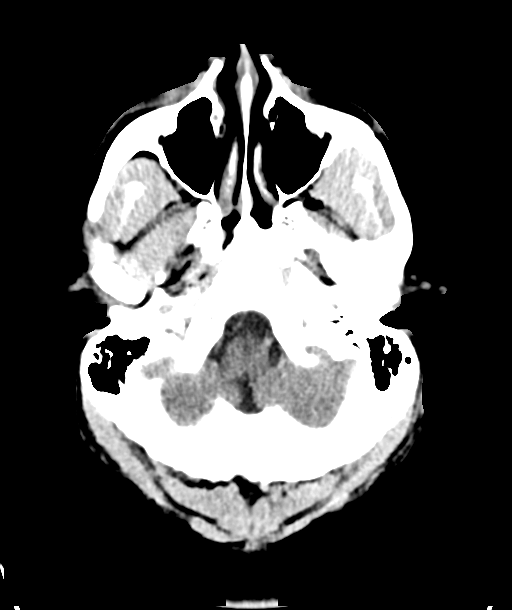
[im 5/34  bone]
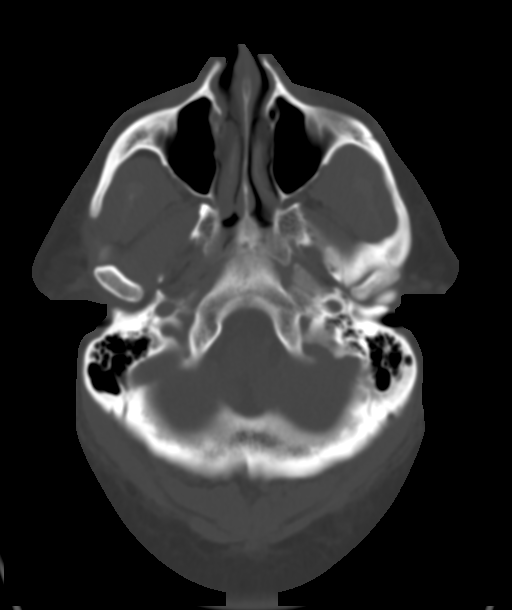
[im 10/34  brain]
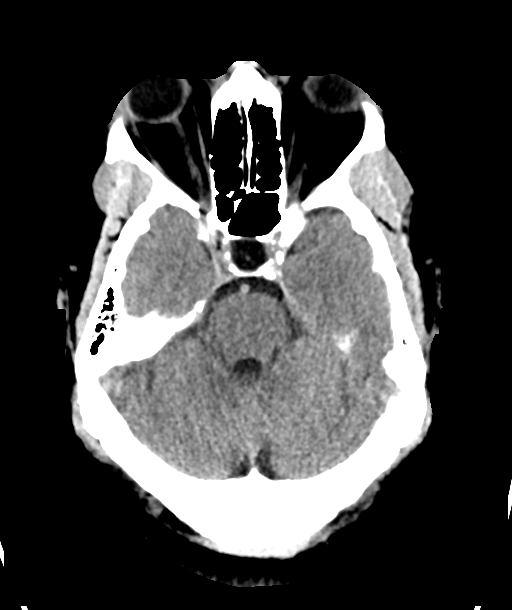
[im 15/34  brain]
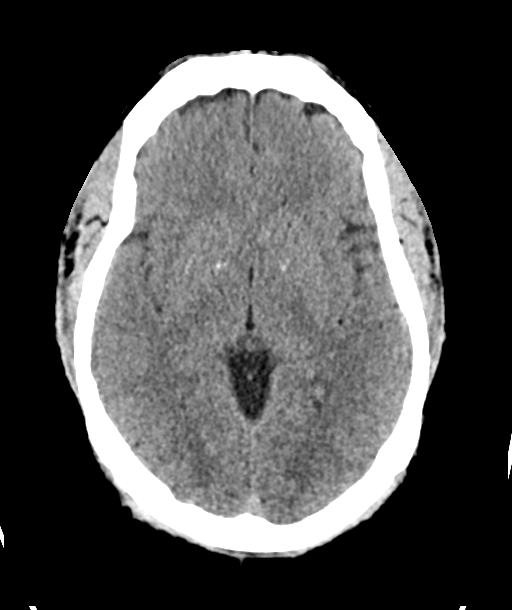
[im 19/34  brain]
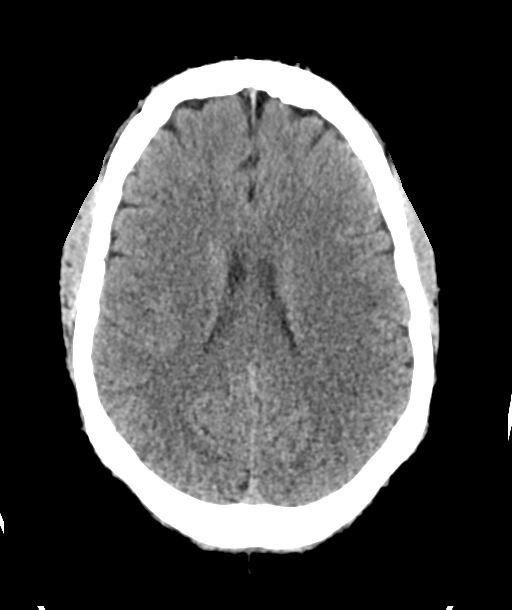
[im 24/34  brain]
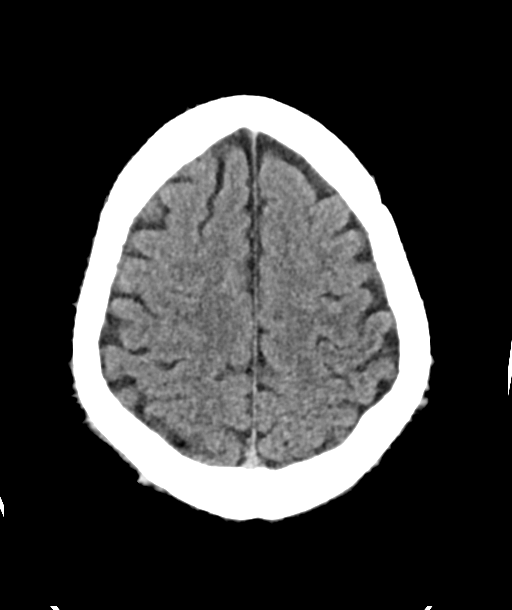
[im 24/34  bone]
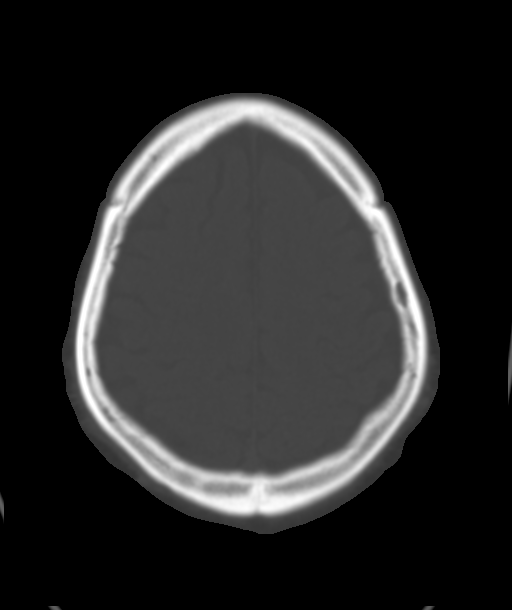
[im 29/34  brain]
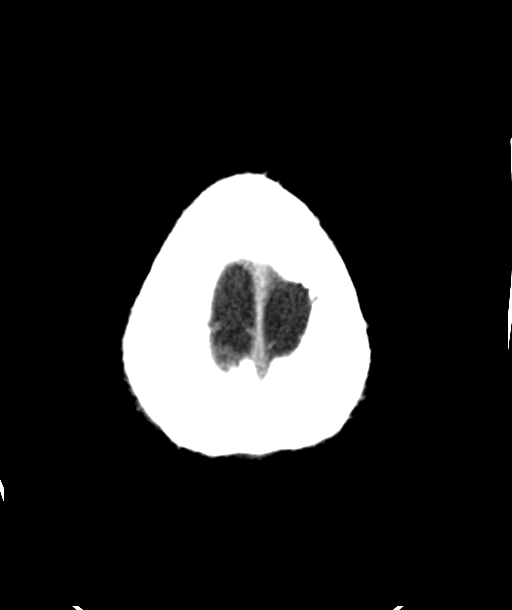

[Series 4: head bone · axial · 0.43mm/px · z∈[+1364,+1422]mm · 4 of 89 slices shown]
[im 9/89  bone]
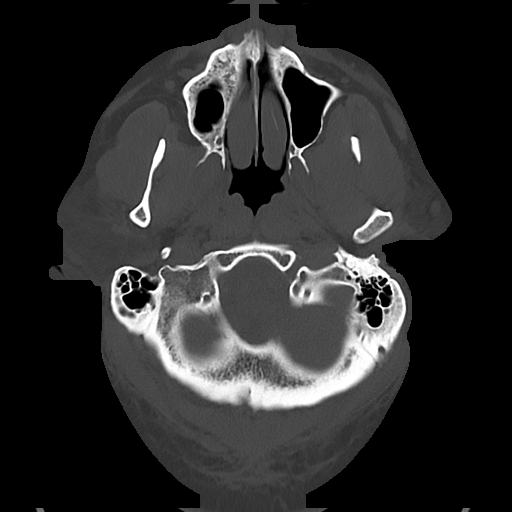
[im 17/89  bone]
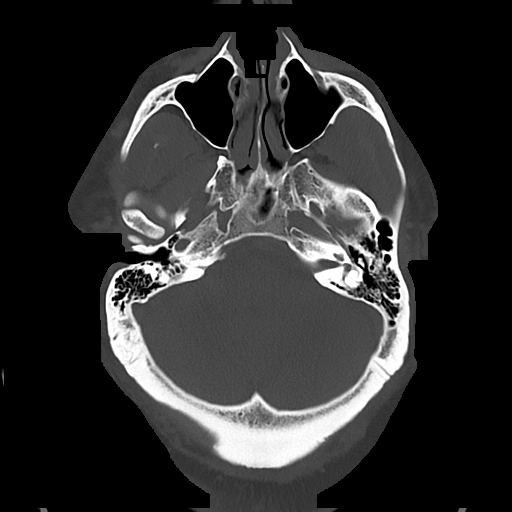
[im 30/89  bone]
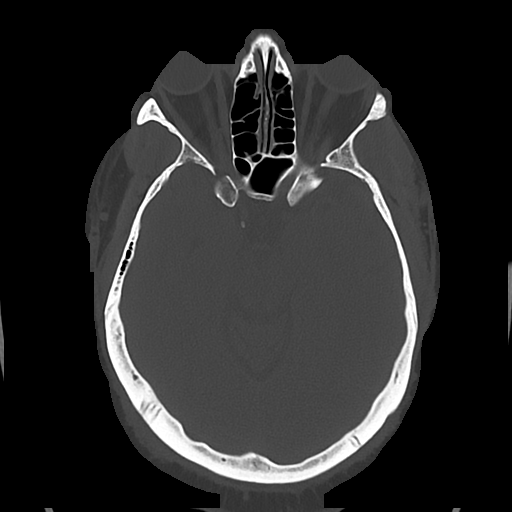
[im 38/89  bone]
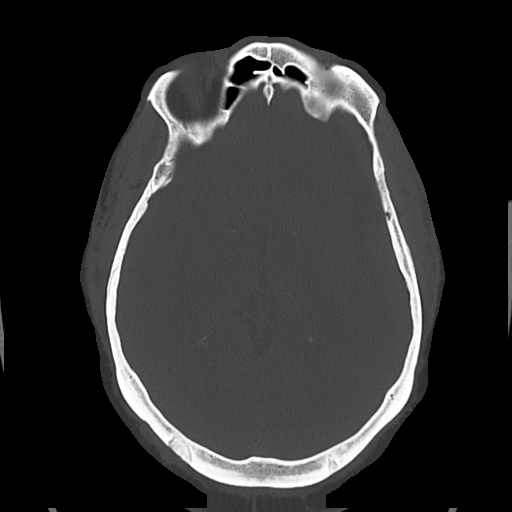

[Series 5: cor soft · coronal · 0.32mm/px · 3 of 79 slices shown]
[im 27/79  brain]
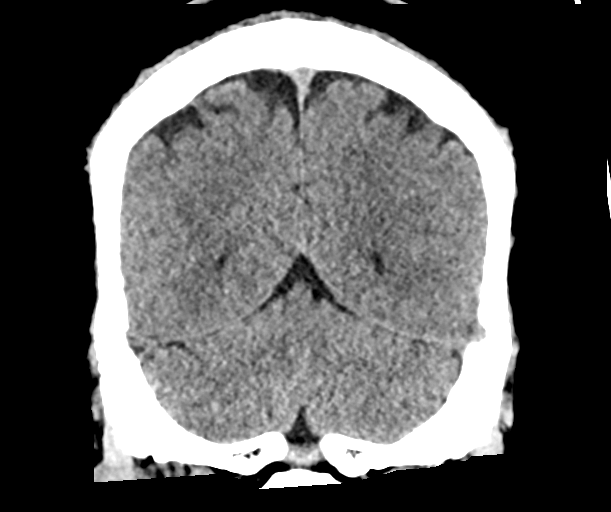
[im 35/79  brain]
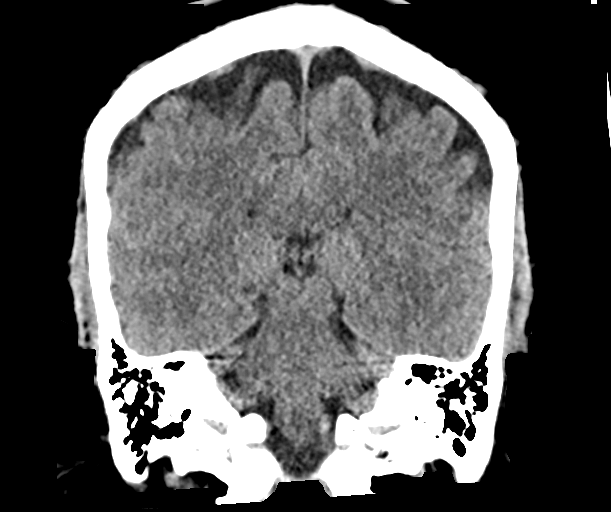
[im 44/79  brain]
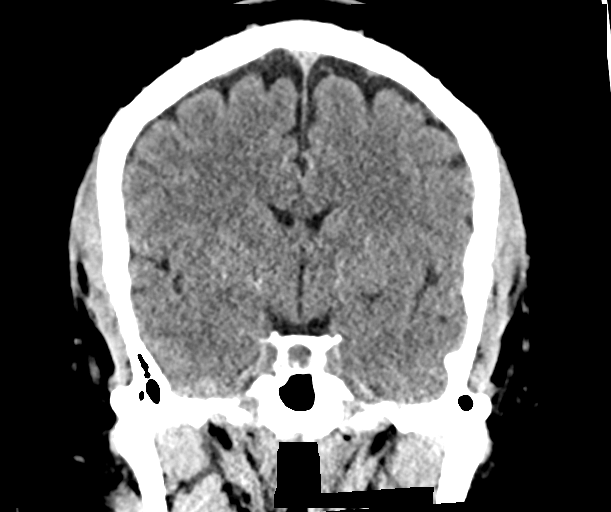

[Series 6: sag soft · sagittal · 0.35mm/px · 3 of 66 slices shown]
[im 22/66  brain]
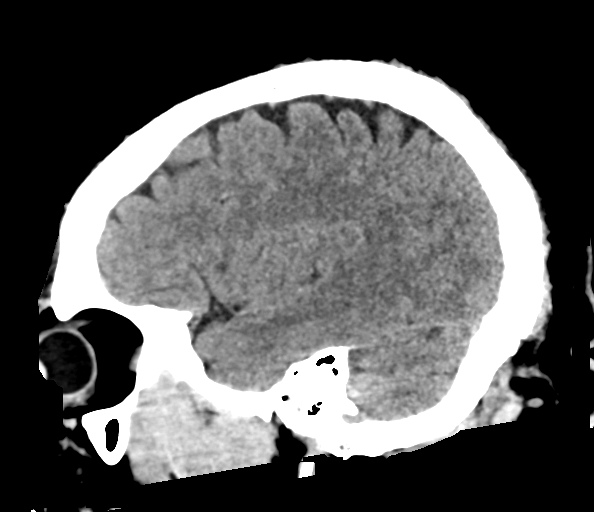
[im 33/66  brain]
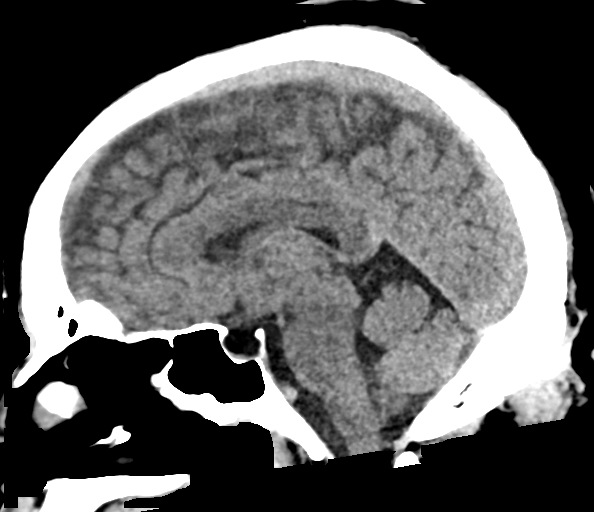
[im 44/66  brain]
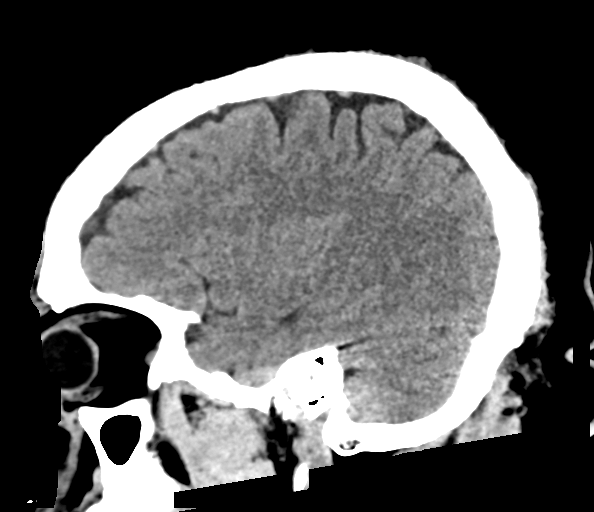

[16 of 47 positions shown; findings below may reference images not displayed]

FINDINGS: Brain: No evidence of acute infarction, hemorrhage, hydrocephalus,
extra-axial collection or mass lesion/mass effect.

Vascular: No hyperdense vessel or unexpected calcification.

Skull: Normal. Negative for fracture or focal lesion.

Sinuses/Orbits: No acute finding.
IMPRESSION: Negative head CT.

## 2023-06-26 IMAGING — CR DG ABDOMEN ACUTE W/ 1V CHEST
4 series · 4 of 4 positions shown · non-contrast
Comparison: Abdomen and pelvis CT 05/08/2011

CLINICAL DATA: Vomiting and diarrhea

EXAM:
DG ABDOMEN ACUTE WITH 1 VIEW CHEST

[chest pa]
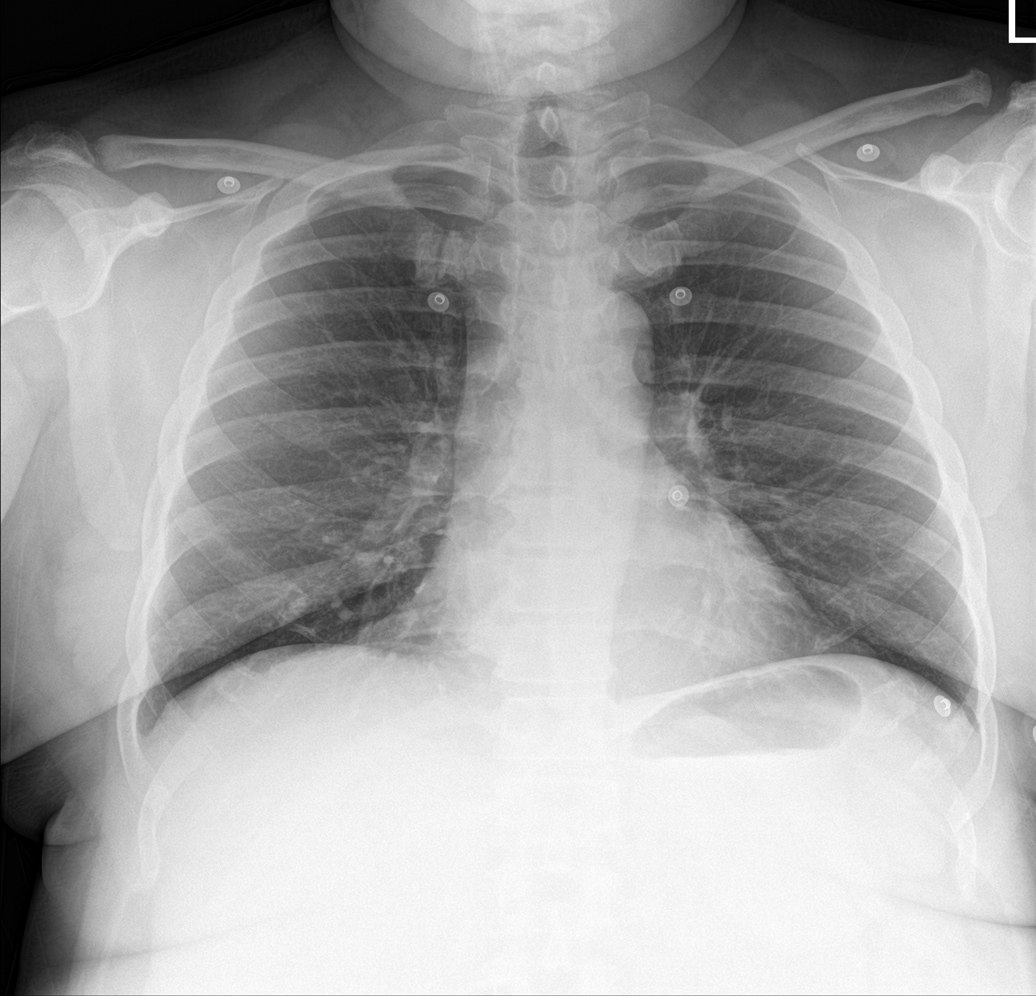

[abdomen erect]
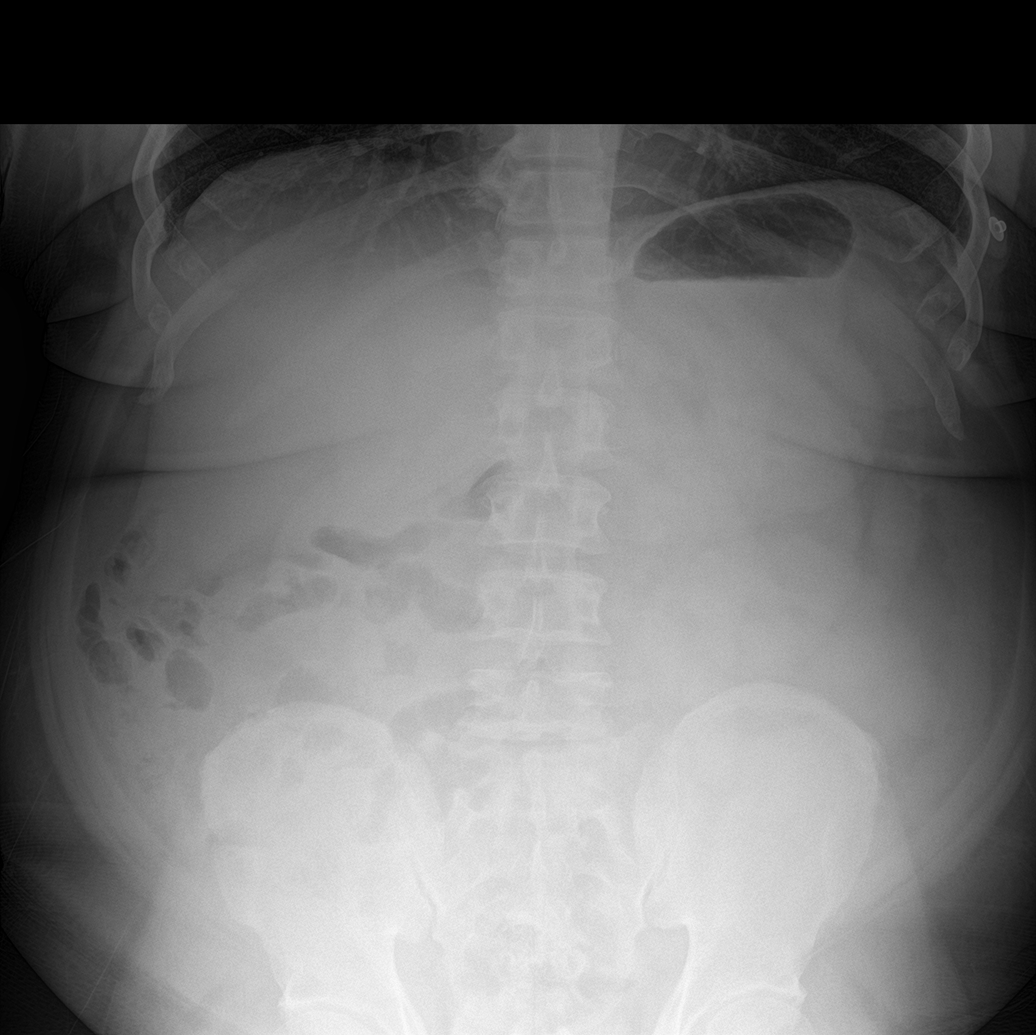

[abdomen supine (1 of 2)]
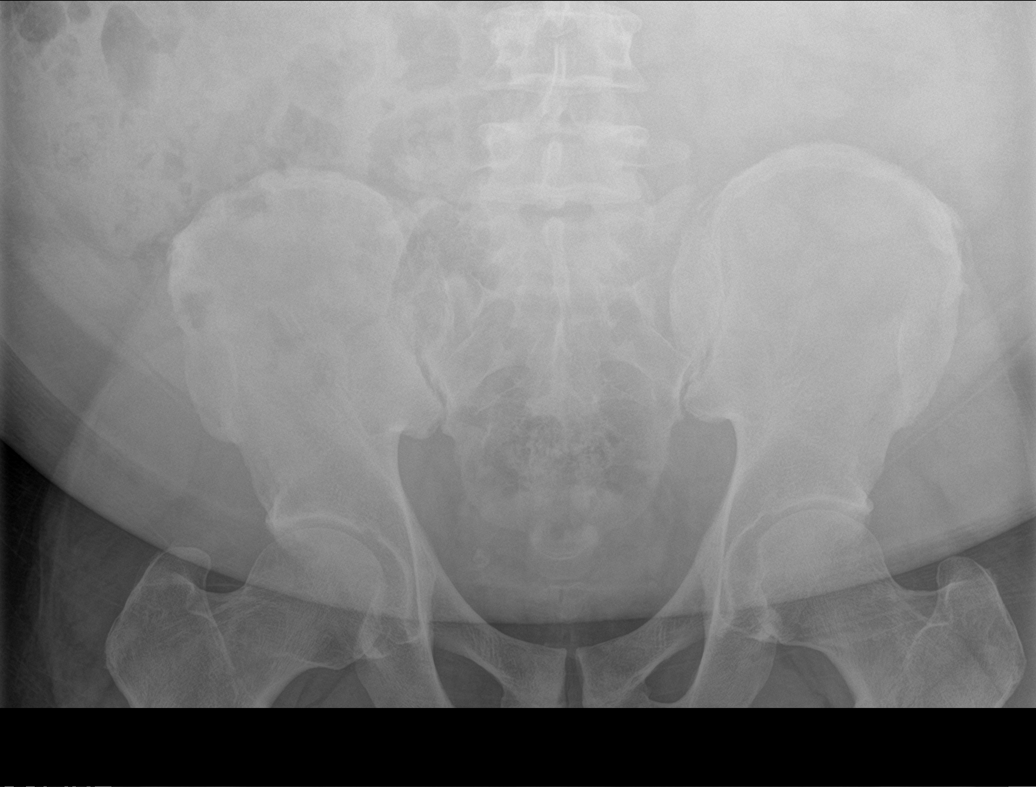

[abdomen supine (2 of 2)]
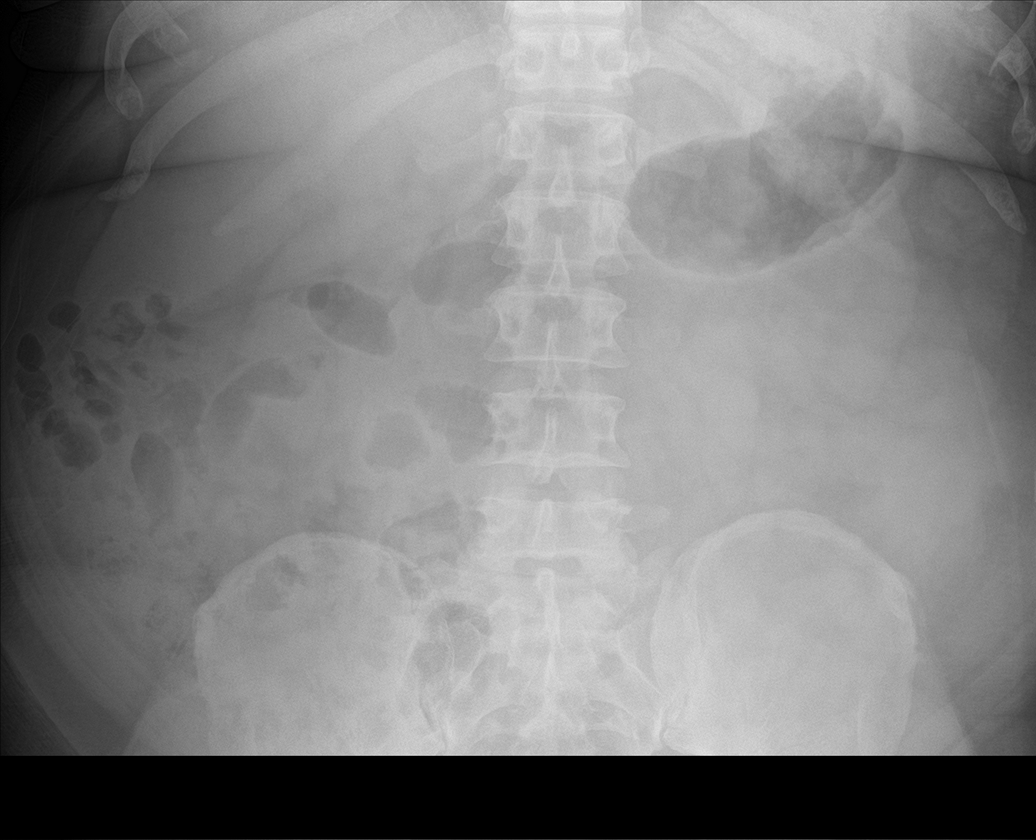

[4 of 4 positions shown; findings below may reference images not displayed]

FINDINGS: Normal heart size and mediastinal contours. There is no edema,
consolidation, effusion, or pneumothorax.

Normal bowel gas pattern. Moderate fluid distention of the stomach.
No concerning mass effect or calcification. No pneumoperitoneum.
IMPRESSION: Negative abdominal radiographs.  No acute cardiopulmonary disease.

## 2023-07-03 ENCOUNTER — Telehealth: Payer: Self-pay | Admitting: *Deleted

## 2023-07-03 NOTE — Telephone Encounter (Signed)
PA is needed for [facility].

## 2023-07-04 ENCOUNTER — Other Ambulatory Visit (HOSPITAL_COMMUNITY): Payer: Self-pay

## 2023-07-04 NOTE — Telephone Encounter (Signed)
Initiated PA via CMM. Insurance requires at least 2 confirmed low morning testosterone levels. Only 1 lab result available. Please advise.

## 2023-07-07 ENCOUNTER — Other Ambulatory Visit: Payer: Self-pay | Admitting: *Deleted

## 2023-07-07 ENCOUNTER — Encounter: Payer: Self-pay | Admitting: Emergency Medicine

## 2023-07-07 DIAGNOSIS — E291 Testicular hypofunction: Secondary | ICD-10-CM

## 2023-07-07 NOTE — Telephone Encounter (Signed)
Order morning testosterone test

## 2023-08-09 LAB — AMB RESULTS CONSOLE CBG: Glucose: 106

## 2023-08-09 NOTE — Progress Notes (Unsigned)
Patient attended screening event 08/09/2023. PCP Sagardia MD. Blood pressure and blood glucose WNL. Patient does not have any SDOH needs at this time.

## 2023-08-19 ENCOUNTER — Encounter: Payer: Self-pay | Admitting: *Deleted

## 2023-08-19 NOTE — Progress Notes (Signed)
Pt attended 08/09/23 screening event where his b/p was 125/78 and his blood sugar was 106. At the event, the event nurse noted pt's PCP was Dr. Alvy Bimler from [facility] and pt did not identify any SDOH needs. Per chart review, pt has been seeing Dr. Alvy Bimler as his PCP on an ongoing basis since 2019; and pt's last in office appt with Dr. Alvy Bimler was on June 24, 2023. No additional health equity team support indicated at this time.

## 2023-08-22 ENCOUNTER — Other Ambulatory Visit: Payer: Self-pay | Admitting: Emergency Medicine

## 2023-08-22 DIAGNOSIS — E1159 Type 2 diabetes mellitus with other circulatory complications: Secondary | ICD-10-CM

## 2023-08-29 ENCOUNTER — Telehealth: Payer: Self-pay | Admitting: *Deleted

## 2023-08-29 NOTE — Telephone Encounter (Signed)
Patient needs a PA done for his Trulicity

## 2023-08-31 ENCOUNTER — Other Ambulatory Visit: Payer: Self-pay | Admitting: Nurse Practitioner

## 2023-08-31 DIAGNOSIS — E119 Type 2 diabetes mellitus without complications: Secondary | ICD-10-CM

## 2023-09-01 ENCOUNTER — Telehealth: Payer: Self-pay

## 2023-09-01 ENCOUNTER — Other Ambulatory Visit (HOSPITAL_COMMUNITY): Payer: Self-pay

## 2023-09-01 NOTE — Telephone Encounter (Signed)
Pharmacy Patient Advocate Encounter   Received notification from Pt Calls Messages that prior authorization for Trulicity 3mg /0.36ml is required/requested.   Insurance verification completed.   The patient is insured through [facility] .   Per test claim: PA required; PA submitted to [facility] via CoverMyMeds Key/confirmation #/EOC [id number] Status is pending

## 2023-09-02 NOTE — Telephone Encounter (Signed)
Pharmacy Patient Advocate Encounter  Received notification from [facility] that Prior Authorization for Trulicity 3mg /0.41ml has been APPROVED from 09/01/23 to 08/30/26   PA #/Case ID/Reference #: [id number]  Approval letter indexed to media tab

## 2023-09-13 ENCOUNTER — Other Ambulatory Visit: Payer: Self-pay | Admitting: Emergency Medicine

## 2023-09-13 DIAGNOSIS — E1159 Type 2 diabetes mellitus with other circulatory complications: Secondary | ICD-10-CM

## 2023-09-24 ENCOUNTER — Other Ambulatory Visit: Payer: Self-pay | Admitting: Emergency Medicine

## 2023-09-24 DIAGNOSIS — E1159 Type 2 diabetes mellitus with other circulatory complications: Secondary | ICD-10-CM

## 2023-10-19 ENCOUNTER — Other Ambulatory Visit: Payer: Self-pay | Admitting: Emergency Medicine

## 2023-10-20 LAB — HM DIABETES EYE EXAM

## 2023-10-24 ENCOUNTER — Other Ambulatory Visit: Payer: Self-pay | Admitting: Emergency Medicine

## 2023-10-24 DIAGNOSIS — I152 Hypertension secondary to endocrine disorders: Secondary | ICD-10-CM

## 2023-11-06 ENCOUNTER — Encounter: Payer: Self-pay | Admitting: Emergency Medicine

## 2023-11-12 ENCOUNTER — Encounter: Payer: Self-pay | Admitting: Emergency Medicine

## 2023-11-25 ENCOUNTER — Ambulatory Visit: Payer: BC Managed Care – PPO | Admitting: Emergency Medicine

## 2023-11-26 ENCOUNTER — Ambulatory Visit (INDEPENDENT_AMBULATORY_CARE_PROVIDER_SITE_OTHER): Payer: BC Managed Care – PPO

## 2023-11-26 ENCOUNTER — Encounter: Payer: Self-pay | Admitting: Emergency Medicine

## 2023-11-26 ENCOUNTER — Ambulatory Visit: Payer: BC Managed Care – PPO | Admitting: Emergency Medicine

## 2023-11-26 VITALS — BP 120/78 | HR 90 | Temp 98.3°F | Ht 67.0 in | Wt 255.6 lb

## 2023-11-26 DIAGNOSIS — I152 Hypertension secondary to endocrine disorders: Secondary | ICD-10-CM

## 2023-11-26 DIAGNOSIS — R0600 Dyspnea, unspecified: Secondary | ICD-10-CM | POA: Diagnosis not present

## 2023-11-26 DIAGNOSIS — E1169 Type 2 diabetes mellitus with other specified complication: Secondary | ICD-10-CM | POA: Diagnosis not present

## 2023-11-26 DIAGNOSIS — E1159 Type 2 diabetes mellitus with other circulatory complications: Secondary | ICD-10-CM

## 2023-11-26 DIAGNOSIS — Z6841 Body Mass Index (BMI) 40.0 and over, adult: Secondary | ICD-10-CM | POA: Diagnosis not present

## 2023-11-26 DIAGNOSIS — Z7984 Long term (current) use of oral hypoglycemic drugs: Secondary | ICD-10-CM

## 2023-11-26 DIAGNOSIS — E785 Hyperlipidemia, unspecified: Secondary | ICD-10-CM

## 2023-11-26 DIAGNOSIS — G4733 Obstructive sleep apnea (adult) (pediatric): Secondary | ICD-10-CM

## 2023-11-26 LAB — POCT GLYCOSYLATED HEMOGLOBIN (HGB A1C): Hemoglobin A1C: 6.4 % — AB (ref 4.0–5.6)

## 2023-11-26 NOTE — Assessment & Plan Note (Signed)
Diet and nutrition discussed. Benefits of exercise discussed. Advised to decrease amount of daily carbohydrate intake and daily calories and increase amount of plant-based protein in his diet 

## 2023-11-26 NOTE — Assessment & Plan Note (Signed)
Well-controlled hypertension Continue lisinopril 20 mg daily and metoprolol succinate 25 mg daily Well-controlled diabetes with hemoglobin A1c higher than before 6.4 and has gained some weight. Cardiovascular risks associated with hypertension and diabetes discussed Diet and nutrition discussed Continues Farxiga 10 mg daily, weekly Trulicity 3 mg, daily metformin 1000 mg twice a day Follow-up in 6 months

## 2023-11-26 NOTE — Assessment & Plan Note (Signed)
Chronic stable conditions Diet and nutrition discussed Continue atorvastatin 40 mg daily.

## 2023-11-26 NOTE — Patient Instructions (Signed)

## 2023-11-26 NOTE — Assessment & Plan Note (Signed)
Compliant with CPAP and has chronic nasal congestion issues Advised to use saline nasal spray frequently

## 2023-11-26 NOTE — Assessment & Plan Note (Signed)
Clinically stable.  No red flag signs or symptoms. Most likely related to recent upper respiratory infection Chest x-ray done today.  Report reviewed.

## 2023-11-26 NOTE — Progress Notes (Signed)
Walter Reynolds. 52 y.o.   Chief Complaint  Patient presents with   Cough    Around Thanksgivings he was having headaches, pressure on his face. And is still having some SOB he is feeling a little better. Pt states he is using his CPAP. Left knee has been having some pain for the past week     HISTORY OF PRESENT ILLNESS: This is a 52 y.o. male here for 19-month follow-up of chronic medical conditions Developed flulike symptoms like cough during Thanksgiving time.  Slowly getting better but still has some congestion I am intermittent difficulty breathing Has occasional left knee pain History of obstructive sleep apnea on CPAP with chronic nasal congestion. No other complaints or medical concerns today. Wt Readings from Last 3 Encounters:  06/24/23 247 lb 2 oz (112.1 kg)  02/05/23 257 lb 6 oz (116.7 kg)  11/05/22 256 lb (116.1 kg)     Cough Associated symptoms include shortness of breath. Pertinent negatives include no chest pain, chills, fever, headaches, rash or sore throat.     Prior to Admission medications   Medication Sig Start Date End Date Taking? Authorizing Provider  Armodafinil 50 MG tablet TAKE 2 TABLETS BY MOUTH EVERY DAY 11/06/22  Yes Olalere, Adewale A, MD  aspirin EC 81 MG EC tablet Take 1 tablet (81 mg total) by mouth daily. 04/02/20  Yes Albertine Grates, MD  atorvastatin (LIPITOR) 40 MG tablet TAKE 1 TABLET BY MOUTH EVERY DAY 10/19/23  Yes Mackensie Pilson, [place], MD  FARXIGA 10 MG TABS tablet TAKE 1 TABLET BY MOUTH DAILY BEFORE BREAKFAST. 10/24/23  Yes Coreena Rubalcava, Eilleen Kempf, MD  fluticasone (FLONASE) 50 MCG/ACT nasal spray SPRAY 2 SPRAYS INTO EACH NOSTRIL EVERY DAY Patient taking differently: Place 2 sprays into both nostrils daily. 01/11/21  Yes Just, Azalee Course, FNP  Lancets [facility] ULTRASOFT) lancets Use as instructed 06/13/22  Yes Elenore Paddy, NP  lisinopril (ZESTRIL) 20 MG tablet TAKE 1 TABLET BY MOUTH EVERY DAY 04/30/23  Yes Georgina Quint, MD  metFORMIN  (GLUCOPHAGE) 1000 MG tablet TAKE 1 TABLET (1,000 MG TOTAL) BY MOUTH TWICE A DAY WITH FOOD 09/14/23  Yes Keil Pickering, Eilleen Kempf, MD  metoprolol succinate (TOPROL-XL) 25 MG 24 hr tablet TAKE 1 TABLET BY MOUTH EVERY DAY 02/19/23  Yes Raevyn Sokol, Eilleen Kempf, MD  [facility] ULTRA TEST test strip USE AS INSTRUCTED 09/01/23  Yes Carlton Sweaney, Eilleen Kempf, MD  Testosterone Enanthate (XYOSTED) 75 MG/0.5ML SOAJ Inject 75 mg into the skin once a week. 06/25/23  Yes Tyreesha Maharaj, Eilleen Kempf, MD  TRULICITY 3 MG/0.5ML SOAJ INJECT 3MG  SUBCUTANEOUSLY ONCE A WEEK 10/24/23  Yes Georgina Quint, MD    Allergies  Allergen Reactions   Doxycycline Hives   Erythromycin Base Hives   Glipizide Shortness Of Breath    Shakey    Armodafinil Palpitations    In high doses per patient.     Patient Active Problem List   Diagnosis Date Noted   Chronic depression 06/24/2023   Chronic right shoulder pain 04/27/2021   Erectile dysfunction 04/26/2021   Body mass index (BMI) of 40.1-44.9 in adult [facility]) 07/17/2020   S/P arthroscopy of left shoulder 02/01/2020   Impingement syndrome of left shoulder 01/14/2020   Hypertension associated with diabetes (HCC) 09/27/2019   Morbid obesity (HCC) 09/27/2019   Dyslipidemia 09/09/2018   Environmental allergies 09/09/2018   BMI 39.0-39.9,adult 09/03/2017   Dyslipidemia associated with type 2 diabetes mellitus (HCC) 09/03/2017   Anemia 04/14/2017   Hyperlipidemia 05/02/2016   OSA (  obstructive sleep apnea) 09/06/2015   Benign essential HTN 09/06/2015    Past Medical History:  Diagnosis Date   Allergy    Diabetes mellitus without complication (HCC)    Elevated cholesterol    Hernia    bi lateral hernia repair   History of kidney stones    Hypertension    Sleep apnea 2014   severe   Umbilical hernia without obstruction and without gangrene 09/03/2017    Past Surgical History:  Procedure Laterality Date   CARPAL TUNNEL RELEASE Right 2007   CARPAL TUNNEL RELEASE Left 10/28/2014    Procedure: LEFT CARPAL TUNNEL RELEASE;  Surgeon: Dairl Ponder, MD;  Location: [facility];  Service: Orthopedics;  Laterality: Left;   FOOT SURGERY Left    INGUINAL HERNIA REPAIR     times two, each side   KIDNEY STONE SURGERY     MASS EXCISION Left 10/28/2014   Procedure: LEFT WRIST VOLAR MASS EXCISION;  Surgeon: Dairl Ponder, MD;  Location: [facility];  Service: Orthopedics;  Laterality: Left;   UMBILICAL HERNIA REPAIR N/A 10/27/2017   Procedure: HERNIA REPAIR UMBILICAL ADULT;  Surgeon: Berna Bue, MD;  Location: [facility] OR;  Service: General;  Laterality: N/A;    Social History   Socioeconomic History   Marital status: Married    Spouse name: Anne Ng   Number of children: 2   Years of education: Associates   Highest education level: Associate degree: occupational, Scientist, product/process development, or vocational program  Occupational History   Occupation: Merchandiser, retail    Comment: [employer]  Tobacco Use   Smoking status: Never   Smokeless tobacco: Never  Vaping Use   Vaping status: Never Used  Substance and Sexual Activity   Alcohol use: No    Alcohol/week: 0.0 standard drinks of alcohol   Drug use: No   Sexual activity: Yes    Partners: Female    Birth control/protection: Condom  Other Topics Concern   Not on file  Social History Narrative   Lives with his wife and their 2 children.   He has 3 sons from a previous relationship that live independently.   Formerly in the [employer].   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   Social Determinants of Health   Financial Resource Strain: Low Risk  (06/22/2023)   Overall Financial Resource Strain (CARDIA)    Difficulty of Paying Living Expenses: Not hard at all  Food Insecurity: No Food Insecurity (06/22/2023)   Hunger Vital Sign    Worried About Running Out of Food in the Last Year: Never true    Ran Out of Food in  the Last Year: Never true  Transportation Needs: No Transportation Needs (06/22/2023)   PRAPARE - Administrator, Civil Service (Medical): No    Lack of Transportation (Non-Medical): No  Physical Activity: Insufficiently Active (06/22/2023)   Exercise Vital Sign    Days of Exercise per Week: 1 day    Minutes of Exercise per Session: 30 min  Stress: Stress Concern Present (06/22/2023)   [employer] of Occupational Health - Occupational Stress Questionnaire    Feeling of Stress : Rather much  Social Connections: Unknown (07/05/2023)   [facility] from [facility]   Social Network    Social Network: Not on file  Intimate Partner Violence: Unknown (07/05/2023)   Received from [facility]   HITS    Physically Hurt: Not on  file    Insult or Talk Down To: Not on file    Threaten Physical Harm: Not on file    Scream or Curse: Not on file    Family History  Problem Relation Age of Onset   Diabetes Mother    Stroke Mother        4 strokes   Heart attack Mother    Heart disease Mother    Hypertension Father    Hypertension Sister    Heart disease Sister        pacemaker     Review of Systems  Constitutional:  Negative for chills and fever.  HENT: Negative.  Negative for congestion and sore throat.   Respiratory:  Positive for cough and shortness of breath.   Cardiovascular: Negative.  Negative for chest pain and palpitations.  Gastrointestinal:  Negative for abdominal pain, nausea and vomiting.  Genitourinary: Negative.  Negative for dysuria and hematuria.  Musculoskeletal:  Positive for joint pain (Chronic bilateral knee pains).  Skin: Negative.  Negative for rash.  Neurological: Negative.  Negative for dizziness and headaches.  All other systems reviewed and are negative.   Today's Vitals   11/26/23 0845  BP: 120/78  Pulse: 90  Temp: 98.3 F (36.8 C)  TempSrc: Oral  SpO2: 95%  Weight: 255 lb 9.6 oz (115.9 kg)  Height: 5\' 7"  (1.702 m)   Body mass index  is 40.03 kg/m.   Physical Exam Vitals reviewed.  Constitutional:      Appearance: Normal appearance. He is obese.  HENT:     Head: Normocephalic.     Mouth/Throat:     Mouth: Mucous membranes are moist.     Pharynx: Oropharynx is clear.  Eyes:     Extraocular Movements: Extraocular movements intact.     Pupils: Pupils are equal, round, and reactive to light.  Cardiovascular:     Rate and Rhythm: Normal rate and regular rhythm.     Pulses: Normal pulses.     Heart sounds: Normal heart sounds.  Pulmonary:     Effort: Pulmonary effort is normal.     Breath sounds: Normal breath sounds.  Skin:    General: Skin is warm and dry.     Capillary Refill: Capillary refill takes less than 2 seconds.  Neurological:     Mental Status: He is alert and oriented to person, place, and time.  Psychiatric:        Mood and Affect: Mood normal.        Behavior: Behavior normal.    Results for orders placed or performed in visit on 11/26/23 (from the past 24 hour(s))  POCT glycosylated hemoglobin (Hb A1C)     Status: Abnormal   Collection Time: 11/26/23 11:54 AM  Result Value Ref Range   Hemoglobin A1C 6.4 (A) 4.0 - 5.6 %   HbA1c POC (<> result, manual entry)     HbA1c, POC (prediabetic range)     HbA1c, POC (controlled diabetic range)      DG Chest 2 View  Result Date: 11/26/2023 CLINICAL DATA:  Shortness of breath. EXAM: CHEST - 2 VIEW COMPARISON:  October 25, 2021. FINDINGS: The heart size and mediastinal contours are within normal limits. Both lungs are clear. The visualized skeletal structures are unremarkable. IMPRESSION: No active cardiopulmonary disease. Electronically Signed   By: Lupita Raider M.D.   On: 11/26/2023 10:01     ASSESSMENT & PLAN: A total of 43 minutes was spent with the patient and counseling/coordination of  care regarding preparing for this visit, review of most recent office visit notes, review of multiple chronic medical conditions and their management,  cardiovascular risks associated with hypertension and diabetes, review of all medications, review of most recent bloodwork results including interpretation of today's hemoglobin A1c, review of health maintenance items, education on nutrition, prognosis, documentation, and need for follow up.   Problem List Items Addressed This Visit       Cardiovascular and Mediastinum   Hypertension associated with diabetes (HCC) - Primary    Well-controlled hypertension Continue lisinopril 20 mg daily and metoprolol succinate 25 mg daily Well-controlled diabetes with hemoglobin A1c higher than before 6.4 and has gained some weight. Cardiovascular risks associated with hypertension and diabetes discussed Diet and nutrition discussed Continues Farxiga 10 mg daily, weekly Trulicity 3 mg, daily metformin 1000 mg twice a day Follow-up in 6 months      Relevant Orders   POCT glycosylated hemoglobin (Hb A1C)     Respiratory   OSA (obstructive sleep apnea)    Compliant with CPAP and has chronic nasal congestion issues Advised to use saline nasal spray frequently        Endocrine   Dyslipidemia associated with type 2 diabetes mellitus (HCC)    Chronic stable conditions Diet and nutrition discussed Continue atorvastatin 40 mg daily.       Relevant Orders   POCT glycosylated hemoglobin (Hb A1C)     Other   Morbid obesity (HCC)    Diet and nutrition discussed Benefits of exercise discussed Advised to decrease amount of daily carbohydrate intake and daily calories and increase amount of plant-based protein in his diet      Body mass index (BMI) of 40.1-44.9 in adult [facility])   Dyspnea    Clinically stable.  No red flag signs or symptoms. Most likely related to recent upper respiratory infection Chest x-ray done today.  Report reviewed.      Relevant Orders   DG Chest 2 View   Patient Instructions  Diabetes Mellitus and Nutrition, Adult When you have diabetes, or diabetes mellitus, it is  very important to have healthy eating habits because your blood sugar (glucose) levels are greatly affected by what you eat and drink. Eating healthy foods in the right amounts, at about the same times every day, can help you: Manage your blood glucose. Lower your risk of heart disease. Improve your blood pressure. Reach or maintain a healthy weight. What can affect my meal plan? Every person with diabetes is different, and each person has different needs for a meal plan. Your health care provider may recommend that you work with a dietitian to make a meal plan that is best for you. Your meal plan may vary depending on factors such as: The calories you need. The medicines you take. Your weight. Your blood glucose, blood pressure, and cholesterol levels. Your activity level. Other health conditions you have, such as heart or kidney disease. How do carbohydrates affect me? Carbohydrates, also called carbs, affect your blood glucose level more than any other type of food. Eating carbs raises the amount of glucose in your blood. It is important to know how many carbs you can safely have in each meal. This is different for every person. Your dietitian can help you calculate how many carbs you should have at each meal and for each snack. How does alcohol affect me? Alcohol can cause a decrease in blood glucose (hypoglycemia), especially if you use insulin or take certain diabetes medicines by  mouth. Hypoglycemia can be a life-threatening condition. Symptoms of hypoglycemia, such as sleepiness, dizziness, and confusion, are similar to symptoms of having too much alcohol. Do not drink alcohol if: Your health care provider tells you not to drink. You are pregnant, may be pregnant, or are planning to become pregnant. If you drink alcohol: Limit how much you have to: 0-1 drink a day for women. 0-2 drinks a day for men. Know how much alcohol is in your drink. In the [place] one drink equals one 12 oz  bottle of beer (355 mL), one 5 oz glass of wine (148 mL), or one 1 oz glass of hard liquor (44 mL). Keep yourself hydrated with water, diet soda, or unsweetened iced tea. Keep in mind that regular soda, juice, and other mixers may contain a lot of sugar and must be counted as carbs. What are tips for following this plan?  Reading food labels Start by checking the serving size on the Nutrition Facts label of packaged foods and drinks. The number of calories and the amount of carbs, fats, and other nutrients listed on the label are based on one serving of the item. Many items contain more than one serving per package. Check the total grams (g) of carbs in one serving. Check the number of grams of saturated fats and trans fats in one serving. Choose foods that have a low amount or none of these fats. Check the number of milligrams (mg) of salt (sodium) in one serving. Most people should limit total sodium intake to less than 2,300 mg per day. Always check the nutrition information of foods labeled as "low-fat" or "nonfat." These foods may be higher in added sugar or refined carbs and should be avoided. Talk to your dietitian to identify your daily goals for nutrients listed on the label. Shopping Avoid buying canned, pre-made, or processed foods. These foods tend to be high in fat, sodium, and added sugar. Shop around the outside edge of the grocery store. This is where you will most often find fresh fruits and vegetables, bulk grains, fresh meats, and fresh dairy products. Cooking Use low-heat cooking methods, such as baking, instead of high-heat cooking methods, such as deep frying. Cook using healthy oils, such as olive, canola, or sunflower oil. Avoid cooking with butter, cream, or high-fat meats. Meal planning Eat meals and snacks regularly, preferably at the same times every day. Avoid going long periods of time without eating. Eat foods that are high in fiber, such as fresh fruits,  vegetables, beans, and whole grains. Eat 4-6 oz (112-168 g) of lean protein each day, such as lean meat, chicken, fish, eggs, or tofu. One ounce (oz) (28 g) of lean protein is equal to: 1 oz (28 g) of meat, chicken, or fish. 1 egg.  cup (62 g) of tofu. Eat some foods each day that contain healthy fats, such as avocado, nuts, seeds, and fish. What foods should I eat? Fruits Berries. Apples. Oranges. Peaches. Apricots. Plums. Grapes. Mangoes. Papayas. Pomegranates. Kiwi. Cherries. Vegetables Leafy greens, including lettuce, spinach, kale, chard, collard greens, mustard greens, and cabbage. Beets. Cauliflower. Broccoli. Carrots. Green beans. Tomatoes. Peppers. Onions. Cucumbers. Brussels sprouts. Grains Whole grains, such as whole-wheat or whole-grain bread, crackers, tortillas, cereal, and pasta. Unsweetened oatmeal. Quinoa. Brown or wild rice. Meats and other proteins Seafood. Poultry without skin. Lean cuts of poultry and beef. Tofu. Nuts. Seeds. Dairy Low-fat or fat-free dairy products such as milk, yogurt, and cheese. The items listed above may not be  a complete list of foods and beverages you can eat and drink. Contact a dietitian for more information. What foods should I avoid? Fruits Fruits canned with syrup. Vegetables Canned vegetables. Frozen vegetables with butter or cream sauce. Grains Refined white flour and flour products such as bread, pasta, snack foods, and cereals. Avoid all processed foods. Meats and other proteins Fatty cuts of meat. Poultry with skin. Breaded or fried meats. Processed meat. Avoid saturated fats. Dairy Full-fat yogurt, cheese, or milk. Beverages Sweetened drinks, such as soda or iced tea. The items listed above may not be a complete list of foods and beverages you should avoid. Contact a dietitian for more information. Questions to ask a health care provider Do I need to meet with a certified diabetes care and education specialist? Do I need to  meet with a dietitian? What number can I call if I have questions? When are the best times to check my blood glucose? Where to find more information: American Diabetes Association: diabetes.org Academy of Nutrition and Dietetics: eatright.[employer] of Diabetes and Digestive and Kidney Diseases: StageSync.si Association of Diabetes Care & Education Specialists: diabeteseducator.org Summary It is important to have healthy eating habits because your blood sugar (glucose) levels are greatly affected by what you eat and drink. It is important to use alcohol carefully. A healthy meal plan will help you manage your blood glucose and lower your risk of heart disease. Your health care provider may recommend that you work with a dietitian to make a meal plan that is best for you. This information is not intended to replace advice given to you by your health care provider. Make sure you discuss any questions you have with your health care provider. Document Revised: 07/05/2020 Document Reviewed: 07/05/2020 Elsevier Patient Education  2024 Elsevier Inc.      Edwina Barth, MD [facility]

## 2023-12-16 ENCOUNTER — Other Ambulatory Visit: Payer: Self-pay | Admitting: Emergency Medicine

## 2023-12-16 DIAGNOSIS — I152 Hypertension secondary to endocrine disorders: Secondary | ICD-10-CM

## 2024-01-13 ENCOUNTER — Ambulatory Visit: Payer: 59 | Admitting: Pulmonary Disease

## 2024-01-13 ENCOUNTER — Encounter: Payer: Self-pay | Admitting: Pulmonary Disease

## 2024-01-13 VITALS — BP 128/78 | HR 92 | Temp 98.1°F | Ht 67.0 in | Wt 260.0 lb

## 2024-01-13 DIAGNOSIS — G4733 Obstructive sleep apnea (adult) (pediatric): Secondary | ICD-10-CM

## 2024-01-13 DIAGNOSIS — G4710 Hypersomnia, unspecified: Secondary | ICD-10-CM

## 2024-01-13 DIAGNOSIS — R0981 Nasal congestion: Secondary | ICD-10-CM | POA: Diagnosis not present

## 2024-01-13 NOTE — Progress Notes (Signed)
Walter Reynolds    [id number]    1971/06/30  Primary Care Physician:Sagardia, Eilleen Kempf, MD  Referring Physician: Georgina Quint, MD [address] [place],  [place] [address]  Chief complaint:   History of obstructive sleep apnea Recent sleep study showing severe obstructive sleep apnea In for follow-up  HPI:  Continues to use CPAP nightly  Has been waking up with nasal stuffiness and congestion and sometimes has to take the mask off because of significant discomfort with nasal stuffiness  Had been on Flonase previously but has not been using it recently He does have some burning in his nasal passages as well  Still try to use his CPAP on a nightly basis  Continues to benefit from CPAP  Continues to lose weight  armodafinil was tried, high doses did cause palpitations Appears to be tolerating lower doses  Use of caffeine pills or coffee, currently drinks black tea Other medications for excessive daytime sleepiness despite adequate treatment of sleep apnea also discussed  About 20 pound weight loss recently  Usually goes to bed between 9 and 11 PM Falls asleep easily About 1-2 awakenings Final wake up time between 6 and 7 AM  Does have a history of hypertension, diabetes, hypercholesterolemia  Does not smoke Does not use alcohol  Admits to dry mouth in the mornings No morning headaches Denies excessive sweating at night  Dad has obstructive sleep apnea  Outpatient Encounter Medications as of 01/13/2024  Medication Sig   Armodafinil 50 MG tablet TAKE 2 TABLETS BY MOUTH EVERY DAY   aspirin EC 81 MG EC tablet Take 1 tablet (81 mg total) by mouth daily.   atorvastatin (LIPITOR) 40 MG tablet TAKE 1 TABLET BY MOUTH EVERY DAY   FARXIGA 10 MG TABS tablet TAKE 1 TABLET BY MOUTH DAILY BEFORE BREAKFAST.   fluticasone (FLONASE) 50 MCG/ACT nasal spray SPRAY 2 SPRAYS INTO EACH NOSTRIL EVERY DAY (Patient taking differently: Place 2 sprays into  both nostrils daily.)   Lancets (ONETOUCH ULTRASOFT) lancets Use as instructed   lisinopril (ZESTRIL) 20 MG tablet TAKE 1 TABLET BY MOUTH EVERY DAY   metFORMIN (GLUCOPHAGE) 1000 MG tablet TAKE 1 TABLET (1,000 MG TOTAL) BY MOUTH TWICE A DAY WITH FOOD   metoprolol succinate (TOPROL-XL) 25 MG 24 hr tablet TAKE 1 TABLET BY MOUTH EVERY DAY   ONETOUCH ULTRA TEST test strip USE AS INSTRUCTED   TRULICITY 3 MG/0.5ML SOAJ INJECT 3 MG  SUBCUTANEOUSLY ONCE A WEEK   Testosterone Enanthate (XYOSTED) 75 MG/0.5ML SOAJ Inject 75 mg into the skin once a week. (Patient not taking: Reported on 01/13/2024)   No facility-administered encounter medications on file as of 01/13/2024.    Allergies as of 01/13/2024 - Review Complete 01/13/2024  Allergen Reaction Noted   Doxycycline Hives 06/01/2015   Erythromycin base Hives 10/16/2012   Glipizide Shortness Of Breath 07/17/2020   Armodafinil Palpitations 01/03/2022    Past Medical History:  Diagnosis Date   Allergy    Diabetes mellitus without complication (HCC)    Elevated cholesterol    Hernia    bi lateral hernia repair   History of kidney stones    Hypertension    Sleep apnea 2014   severe   Umbilical hernia without obstruction and without gangrene 09/03/2017    Past Surgical History:  Procedure Laterality Date   CARPAL TUNNEL RELEASE Right 2007   CARPAL TUNNEL RELEASE Left 10/28/2014   Procedure: LEFT CARPAL TUNNEL RELEASE;  Surgeon: Dairl Ponder, MD;  Location: [facility];  Service: Orthopedics;  Laterality: Left;   FOOT SURGERY Left    INGUINAL HERNIA REPAIR     times two, each side   KIDNEY STONE SURGERY     MASS EXCISION Left 10/28/2014   Procedure: LEFT WRIST VOLAR MASS EXCISION;  Surgeon: Dairl Ponder, MD;  Location: [facility];  Service: Orthopedics;  Laterality: Left;   UMBILICAL HERNIA REPAIR N/A 10/27/2017   Procedure: HERNIA REPAIR UMBILICAL ADULT;  Surgeon: Berna Bue, MD;  Location: [facility] OR;   Service: General;  Laterality: N/A;    Family History  Problem Relation Age of Onset   Diabetes Mother    Stroke Mother        4 strokes   Heart attack Mother    Heart disease Mother    Hypertension Father    Hypertension Sister    Heart disease Sister        pacemaker    Social History   Socioeconomic History   Marital status: Married    Spouse name: DYON ROTERT   Number of children: 2   Years of education: Associates   Highest education level: Associate degree: occupational, Scientist, product/process development, or vocational program  Occupational History   Occupation: Merchandiser, retail    Comment: [employer]  Tobacco Use   Smoking status: Never   Smokeless tobacco: Never  Vaping Use   Vaping status: Never Used  Substance and Sexual Activity   Alcohol use: No    Alcohol/week: 0.0 standard drinks of alcohol   Drug use: No   Sexual activity: Yes    Partners: Female    Birth control/protection: Condom  Other Topics Concern   Not on file  Social History Narrative   Lives with his wife and their 2 children.   He has 3 sons from a previous relationship that live independently.   Formerly in the [employer].   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   Social Drivers of Corporate investment banker Strain: Low Risk  (06/22/2023)   Overall Financial Resource Strain (CARDIA)    Difficulty of Paying Living Expenses: Not hard at all  Food Insecurity: No Food Insecurity (06/22/2023)   Hunger Vital Sign    Worried About Running Out of Food in the Last Year: Never true    Ran Out of Food in the Last Year: Never true  Transportation Needs: No Transportation Needs (06/22/2023)   PRAPARE - Administrator, Civil Service (Medical): No    Lack of Transportation (Non-Medical): No  Physical Activity: Insufficiently Active (06/22/2023)   Exercise Vital Sign    Days of Exercise per Week: 1 day    Minutes of Exercise per  Session: 30 min  Stress: Stress Concern Present (06/22/2023)   [employer] of Occupational Health - Occupational Stress Questionnaire    Feeling of Stress : Rather much  Social Connections: Unknown (07/05/2023)   [facility]   Social Network    Social Network: Not on file  Intimate Partner Violence: Unknown (07/05/2023)   Received from [facility]   HITS    Physically Hurt: Not on file    Insult or Talk Down To: Not on file    Threaten Physical Harm: Not on file    Scream or Curse: Not on file    Review of Systems  Constitutional:  Positive for fatigue.  HENT: Negative.    Respiratory:  Positive for apnea.   Cardiovascular: Negative.   Psychiatric/Behavioral:  Positive for sleep disturbance.   All other systems reviewed and are negative.   Vitals:   01/13/24 1107  BP: 128/78  Pulse: 92  Temp: 98.1 F (36.7 C)  SpO2: 96%      Physical Exam Constitutional:      Appearance: He is obese.  HENT:     Head: Normocephalic.     Mouth/Throat:     Mouth: Mucous membranes are moist.     Pharynx: No oropharyngeal exudate or posterior oropharyngeal erythema.     Comments: Macroglossia, crowded oropharynx, Mallampati 4 Eyes:     General:        Right eye: No discharge.        Left eye: No discharge.     Pupils: Pupils are equal, round, and reactive to light.  Cardiovascular:     Rate and Rhythm: Normal rate and regular rhythm.     Pulses: Normal pulses.     Heart sounds: Normal heart sounds. No murmur heard.    No friction rub.  Pulmonary:     Effort: Pulmonary effort is normal. No respiratory distress.     Breath sounds: Normal breath sounds. No stridor. No wheezing or rhonchi.  Musculoskeletal:     Cervical back: No rigidity or tenderness.  Neurological:     Mental Status: He is alert.  Psychiatric:        Mood and Affect: Mood normal.    Previous study not available for review    09/20/2020   10:10 AM  Results of the Epworth flowsheet   Sitting and reading 0  Watching TV 0  Sitting, inactive in a public place (e.g. a theatre or a meeting) 0  As a passenger in a car for an hour without a break 3  Lying down to rest in the afternoon when circumstances permit 0  Sitting and talking to someone 0  Sitting quietly after a lunch without alcohol 0  In a car, while stopped for a few minutes in traffic 1  Total score 4   Data Reviewed: Recent echo with ejection fraction of 45 to 50% with global hypokinesis on the left, diastolic dysfunction normal right ventricular function.  Compliance data reveals 94% compliance Average use of 5 hours 45 minutes AutoSet of 5-15 Residual AHI 1.3 with 95 percentile pressure of 10  Assessment:   History of obstructive sleep apnea Severe obstructive sleep apnea Appears adequately treated with CPAP therapy -Encouraged to continue using CPAP on a nightly basis  Nasal stuffiness and congestion -Trial with Flonase on a regular basis -May add Astelin if Flonase not effective enough  Excessive daytime sleepiness with daytime fatigue -Use of stimulants including armodafinil -Caffeinated beverages may help  Regular exercises and diet encouraged  Importance of continuing to keep his weight and check discussed  Plan/Recommendations:  Continue nightly CPAP  Continue weight loss efforts  Encouraged to use Flonase, may add Astelin for nasal stuffiness and congestion  Tentative follow-up in 6 months  Virl Diamond MD [facility] 01/13/2024, 12:52 PM  CC: Georgina Quint, *

## 2024-01-13 NOTE — Patient Instructions (Signed)
Continue using your CPAP on a nightly basis  For the nasal stuffiness and congestion  Resume using Flonase or any other inhaled steroid  Astelin may be used in combination with the inhaled steroid to help with the nasal stuffiness  Follow-up in 6 months  Call us with significant concerns

## 2024-01-18 ENCOUNTER — Other Ambulatory Visit: Payer: Self-pay | Admitting: Emergency Medicine

## 2024-01-21 ENCOUNTER — Encounter: Payer: Self-pay | Admitting: Emergency Medicine

## 2024-01-22 ENCOUNTER — Other Ambulatory Visit: Payer: Self-pay | Admitting: Radiology

## 2024-01-22 DIAGNOSIS — E291 Testicular hypofunction: Secondary | ICD-10-CM

## 2024-01-22 MED ORDER — XYOSTED 75 MG/0.5ML ~~LOC~~ SOAJ: 75.0000 mg | SUBCUTANEOUS | 5 refills | Status: DC

## 2024-01-22 NOTE — Progress Notes (Signed)
 Sent. Thanks.

## 2024-01-22 NOTE — Telephone Encounter (Signed)
 Please look into this. Thanks.

## 2024-01-23 ENCOUNTER — Telehealth: Payer: Self-pay

## 2024-01-23 ENCOUNTER — Encounter: Payer: Self-pay | Admitting: Radiology

## 2024-01-23 NOTE — Telephone Encounter (Signed)
 Pharmacy Patient Advocate Encounter   Received notification from Patient Advice Request messages that prior authorization for Xyosted  75MG /0.5ML auto-injectors is required/requested.   Insurance verification completed.   The patient is insured through [facility] .   Prior Authorization form/request asks a question that requires your assistance. Please see the question below and advise accordingly. The PA will not be submitted until the necessary information is received.         Key: [id number]

## 2024-01-27 NOTE — Telephone Encounter (Signed)
Key archived until second testosterone level is received.

## 2024-02-02 ENCOUNTER — Other Ambulatory Visit: Payer: Self-pay | Admitting: Radiology

## 2024-02-02 DIAGNOSIS — E291 Testicular hypofunction: Secondary | ICD-10-CM

## 2024-03-11 ENCOUNTER — Other Ambulatory Visit (INDEPENDENT_AMBULATORY_CARE_PROVIDER_SITE_OTHER)

## 2024-03-11 DIAGNOSIS — E291 Testicular hypofunction: Secondary | ICD-10-CM

## 2024-03-12 LAB — TESTOSTERONE: Testosterone: 265.38 ng/dL — ABNORMAL LOW (ref 300.00–890.00)

## 2024-03-30 ENCOUNTER — Other Ambulatory Visit (HOSPITAL_COMMUNITY): Payer: Self-pay

## 2024-03-30 ENCOUNTER — Telehealth: Payer: Self-pay

## 2024-03-30 NOTE — Telephone Encounter (Signed)
 Pharmacy Patient Advocate Encounter   Received notification from Pt Calls Messages that prior authorization for Xyosted 75MG /0.5ML auto-injectors is required/requested.   Insurance verification completed.   The patient is insured through [facility] .   Per test claim: PA required; PA submitted to above mentioned insurance via CoverMyMeds Key/confirmation #/EOC [id number] Status is pending

## 2024-03-31 NOTE — Telephone Encounter (Signed)
 Pharmacy Patient Advocate Encounter  Received notification from [facility] that Prior Authorization for Walter Reynolds has been APPROVED from 03/30/2024 to 03/31/2027   PA #/Case ID/Reference #: [id number]

## 2024-03-31 NOTE — Telephone Encounter (Signed)
 PA request has been Approved. New Encounter has been or will be created for follow up. For additional info see Pharmacy Prior Auth telephone encounter from 03/30/2024.

## 2024-04-14 ENCOUNTER — Other Ambulatory Visit: Payer: Self-pay | Admitting: Emergency Medicine

## 2024-04-14 ENCOUNTER — Other Ambulatory Visit: Payer: Self-pay | Admitting: Radiology

## 2024-04-14 DIAGNOSIS — E291 Testicular hypofunction: Secondary | ICD-10-CM

## 2024-04-14 DIAGNOSIS — E1159 Type 2 diabetes mellitus with other circulatory complications: Secondary | ICD-10-CM

## 2024-04-14 MED ORDER — TRULICITY 3 MG/0.5ML ~~LOC~~ SOAJ: 3.0000 mg | SUBCUTANEOUS | 0 refills | Status: DC

## 2024-04-14 MED ORDER — XYOSTED 75 MG/0.5ML ~~LOC~~ SOAJ: 75.0000 mg | SUBCUTANEOUS | 5 refills | Status: DC

## 2024-04-14 MED ORDER — XYOSTED 75 MG/0.5ML ~~LOC~~ SOAJ: 75.0000 mg | SUBCUTANEOUS | 5 refills | Status: AC

## 2024-04-14 NOTE — Telephone Encounter (Signed)
 Prescriptions for Trulicity  and [facility]osted  [facility]  [facility] his message.  What pharmacy does he want us  to use?  Please find out.  Thanks.

## 2024-04-15 ENCOUNTER — Other Ambulatory Visit: Payer: Self-pay | Admitting: Radiology

## 2024-04-15 DIAGNOSIS — E1159 Type 2 diabetes mellitus with other circulatory complications: Secondary | ICD-10-CM

## 2024-04-15 MED ORDER — TRULICITY 3 MG/0.5ML ~~LOC~~ SOAJ: 3.0000 mg | SUBCUTANEOUS | 0 refills | Status: DC

## 2024-04-16 ENCOUNTER — Telehealth: Payer: Self-pay | Admitting: Emergency Medicine

## 2024-04-16 NOTE — Telephone Encounter (Signed)
 [facility]ed [facility] call and had to LVM for staff to call back regarding missing information on the refill script for testosterone  Enanthate (XYOSTED ) 75 MG/0.5ML SOAJ that they need clarification on.

## 2024-04-16 NOTE — Telephone Encounter (Signed)
 Copied from CRM [id number]. Topic: Clinical - Medication Question >> Apr 16, 2024  9:58 AM [place] J wrote: Reason for CRM: [facility] told [facility] that there is missing information on the refill script for testosterone  Enanthate (XYOSTED ) 75 MG/0.5ML SOAJ that they need clarification on.  Please call patient with an update.   [id number] [place] [place] - [address] AT [facility] [address] [place] [address] Phone: [phone] Fax: [phone] Hours: Open 24 hours

## 2024-05-26 ENCOUNTER — Ambulatory Visit: Payer: Self-pay | Admitting: Emergency Medicine

## 2024-05-26 ENCOUNTER — Encounter: Payer: Self-pay | Admitting: Emergency Medicine

## 2024-05-26 ENCOUNTER — Ambulatory Visit: Payer: BC Managed Care – PPO | Admitting: Emergency Medicine

## 2024-05-26 VITALS — BP 122/84 | HR 86 | Temp 98.5°F | Ht 67.0 in | Wt 260.0 lb

## 2024-05-26 DIAGNOSIS — E1169 Type 2 diabetes mellitus with other specified complication: Secondary | ICD-10-CM | POA: Diagnosis not present

## 2024-05-26 DIAGNOSIS — Z7984 Long term (current) use of oral hypoglycemic drugs: Secondary | ICD-10-CM

## 2024-05-26 DIAGNOSIS — Z1211 Encounter for screening for malignant neoplasm of colon: Secondary | ICD-10-CM

## 2024-05-26 DIAGNOSIS — E1159 Type 2 diabetes mellitus with other circulatory complications: Secondary | ICD-10-CM | POA: Diagnosis not present

## 2024-05-26 DIAGNOSIS — E785 Hyperlipidemia, unspecified: Secondary | ICD-10-CM

## 2024-05-26 DIAGNOSIS — I152 Hypertension secondary to endocrine disorders: Secondary | ICD-10-CM | POA: Diagnosis not present

## 2024-05-26 LAB — COMPREHENSIVE METABOLIC PANEL WITH GFR
ALT: 29 U/L (ref 0–53)
AST: 21 U/L (ref 0–37)
Albumin: 4.3 g/dL (ref 3.5–5.2)
Alkaline Phosphatase: 82 U/L (ref 39–117)
BUN: 11 mg/dL (ref 6–23)
CO2: 27 meq/L (ref 19–32)
Calcium: 9.5 mg/dL (ref 8.4–10.5)
Chloride: 103 meq/L (ref 96–112)
Creatinine, Ser: 0.85 mg/dL (ref 0.40–1.50)
GFR: 98.61 mL/min (ref >60.00)
Glucose, Bld: 113 mg/dL — ABNORMAL HIGH (ref 70–99)
Potassium: 4.6 meq/L (ref 3.5–5.1)
Sodium: 139 meq/L (ref 135–145)
Total Bilirubin: 0.5 mg/dL (ref 0.2–1.2)
Total Protein: 6.8 g/dL (ref 6.0–8.3)

## 2024-05-26 LAB — CBC WITH DIFFERENTIAL/PLATELET
Basophils Absolute: 0 10*3/uL (ref 0.0–0.1)
Basophils Relative: 0.5 % (ref 0.0–3.0)
Eosinophils Absolute: 0.2 10*3/uL (ref 0.0–0.7)
Eosinophils Relative: 2.3 % (ref 0.0–5.0)
HCT: 43.1 % (ref 39.0–52.0)
Hemoglobin: 13.5 g/dL (ref 13.0–17.0)
Lymphocytes Relative: 35.9 % (ref 12.0–46.0)
Lymphs Abs: 2.5 10*3/uL (ref 0.7–4.0)
MCHC: 31.4 g/dL (ref 30.0–36.0)
MCV: 74.6 fl — ABNORMAL LOW (ref 78.0–100.0)
Monocytes Absolute: 0.7 10*3/uL (ref 0.1–1.0)
Monocytes Relative: 9.7 % (ref 3.0–12.0)
Neutro Abs: 3.5 10*3/uL (ref 1.4–7.7)
Neutrophils Relative %: 51.6 % (ref 43.0–77.0)
Platelets: 260 10*3/uL (ref 150.0–400.0)
RBC: 5.78 Mil/uL (ref 4.22–5.81)
RDW: 15.6 % — ABNORMAL HIGH (ref 11.5–15.5)
WBC: 6.9 10*3/uL (ref 4.0–10.5)

## 2024-05-26 LAB — LIPID PANEL
Cholesterol: 126 mg/dL (ref 0–200)
HDL: 25.9 mg/dL — ABNORMAL LOW (ref >39.00)
LDL Cholesterol: 75 mg/dL (ref 0–99)
NonHDL: 99.84
Total CHOL/HDL Ratio: 5
Triglycerides: 126 mg/dL (ref 0.0–149.0)
VLDL: 25.2 mg/dL (ref 0.0–40.0)

## 2024-05-26 LAB — HEMOGLOBIN A1C: Hgb A1c MFr Bld: 7.5 % — ABNORMAL HIGH (ref 4.6–6.5)

## 2024-05-26 MED ORDER — DAPAGLIFLOZIN PROPANEDIOL 10 MG PO TABS: 10.0000 mg | ORAL_TABLET | Freq: Every day | ORAL | 3 refills | Status: AC

## 2024-05-26 NOTE — Patient Instructions (Signed)
 Health Maintenance, Male  Adopting a healthy lifestyle and getting preventive care are important in promoting health and wellness. Ask your health care provider about:  The right schedule for you to have regular tests and exams.  Things you can do on your own to prevent diseases and keep yourself healthy.  What should I know about diet, weight, and exercise?  Eat a healthy diet    Eat a diet that includes plenty of vegetables, fruits, low-fat dairy products, and lean protein.  Do not eat a lot of foods that are high in solid fats, added sugars, or sodium.  Maintain a healthy weight  Body mass index (BMI) is a measurement that can be used to identify possible weight problems. It estimates body fat based on height and weight. Your health care provider can help determine your BMI and help you achieve or maintain a healthy weight.  Get regular exercise  Get regular exercise. This is one of the most important things you can do for your health. Most adults should:  Exercise for at least 150 minutes each week. The exercise should increase your heart rate and make you sweat (moderate-intensity exercise).  Do strengthening exercises at least twice a week. This is in addition to the moderate-intensity exercise.  Spend less time sitting. Even light physical activity can be beneficial.  Watch cholesterol and blood lipids  Have your blood tested for lipids and cholesterol at 54 years of age, then have this test every 5 years.  You may need to have your cholesterol levels checked more often if:  Your lipid or cholesterol levels are high.  You are older than 54 years of age.  You are at high risk for heart disease.  What should I know about cancer screening?  Many types of cancers can be detected early and may often be prevented. Depending on your health history and family history, you may need to have cancer screening at various ages. This may include screening for:  Colorectal cancer.  Prostate cancer.  Skin cancer.  Lung  cancer.  What should I know about heart disease, diabetes, and high blood pressure?  Blood pressure and heart disease  High blood pressure causes heart disease and increases the risk of stroke. This is more likely to develop in people who have high blood pressure readings or are overweight.  Talk with your health care provider about your target blood pressure readings.  Have your blood pressure checked:  Every 3-5 years if you are 9-95 years of age.  Every year if you are 85 years old or older.  If you are between the ages of 29 and 29 and are a current or former smoker, ask your health care provider if you should have a one-time screening for abdominal aortic aneurysm (AAA).  Diabetes  Have regular diabetes screenings. This checks your fasting blood sugar level. Have the screening done:  Once every three years after age 23 if you are at a normal weight and have a low risk for diabetes.  More often and at a younger age if you are overweight or have a high risk for diabetes.  What should I know about preventing infection?  Hepatitis B  If you have a higher risk for hepatitis B, you should be screened for this virus. Talk with your health care provider to find out if you are at risk for hepatitis B infection.  Hepatitis C  Blood testing is recommended for:  Everyone born from [birth year] through 1966.  Anyone  with known risk factors for hepatitis C.  Sexually transmitted infections (STIs)  You should be screened each year for STIs, including gonorrhea and chlamydia, if:  You are sexually active and are younger than 54 years of age.  You are older than 54 years of age and your health care provider tells you that you are at risk for this type of infection.  Your sexual activity has changed since you were last screened, and you are at increased risk for chlamydia or gonorrhea. Ask your health care provider if you are at risk.  Ask your health care provider about whether you are at high risk for HIV. Your health care provider  may recommend a prescription medicine to help prevent HIV infection. If you choose to take medicine to prevent HIV, you should first get tested for HIV. You should then be tested every 3 months for as long as you are taking the medicine.  Follow these instructions at home:  Alcohol use  Do not drink alcohol if your health care provider tells you not to drink.  If you drink alcohol:  Limit how much you have to 0-2 drinks a day.  Know how much alcohol is in your drink. In the [place] one drink equals one 12 oz bottle of beer (355 mL), one 5 oz glass of wine (148 mL), or one 1 oz glass of hard liquor (44 mL).  Lifestyle  Do not use any products that contain nicotine or tobacco. These products include cigarettes, chewing tobacco, and vaping devices, such as e-cigarettes. If you need help quitting, ask your health care provider.  Do not use street drugs.  Do not share needles.  Ask your health care provider for help if you need support or information about quitting drugs.  General instructions  Schedule regular health, dental, and eye exams.  Stay current with your vaccines.  Tell your health care provider if:  You often feel depressed.  You have ever been abused or do not feel safe at home.  Summary  Adopting a healthy lifestyle and getting preventive care are important in promoting health and wellness.  Follow your health care provider's instructions about healthy diet, exercising, and getting tested or screened for diseases.  Follow your health care provider's instructions on monitoring your cholesterol and blood pressure.  This information is not intended to replace advice given to you by your health care provider. Make sure you discuss any questions you have with your health care provider.  Document Revised: 04/23/2021 Document Reviewed: 04/23/2021  Elsevier Patient Education  2024 [employer].

## 2024-05-26 NOTE — Assessment & Plan Note (Addendum)
 Well-controlled hypertension Continue lisinopril  20 mg daily and metoprolol  succinate 25 mg daily Hemoglobin A1c higher than before at 7.5 Cardiovascular risks associated with hypertension and diabetes discussed Diet and nutrition discussed Continues Farxiga  10 mg daily, weekly Trulicity  3 mg, daily metformin  1000 mg twice a day Follow-up in 6 months

## 2024-05-26 NOTE — Assessment & Plan Note (Signed)
Diet and nutrition discussed. Benefits of exercise discussed. Advised to decrease amount of daily carbohydrate intake and daily calories and increase amount of plant-based protein in his diet 

## 2024-05-26 NOTE — Progress Notes (Signed)
 Walter Reynolds. 53 y.o.   Chief Complaint  Patient presents with   Follow-up    6 month f/u for HTN / DM. Wants to get a colonoscopy done. He has been having trouble getting his XYOSTED  PA was approved will call [facility] to see what the issue is.    HISTORY OF PRESENT ILLNESS: This is a 53 y.o. male here for 14-month follow-up of chronic medical conditions including hypertension diabetes Overall doing well. Wt Readings from Last 3 Encounters:  05/26/24 260 lb (117.9 kg)  01/13/24 260 lb (117.9 kg)  11/26/23 255 lb 9.6 oz (115.9 kg)   BP Readings from Last 3 Encounters:  05/26/24 122/84  01/13/24 128/78  11/26/23 120/78     HPI   Prior to Admission medications   Medication Sig Start Date End Date Taking? Authorizing Provider  aspirin  EC 81 MG EC tablet Take 1 tablet (81 mg total) by mouth daily. 04/02/20  Yes Lavaughn Portland, MD  atorvastatin  (LIPITOR) 40 MG tablet TAKE 1 TABLET BY MOUTH EVERY DAY 10/19/23  Yes Arda Daggs, Isidro Margo, MD  Dulaglutide  (TRULICITY ) 3 MG/0.5ML SOAJ Inject 3 mg into the skin once a week. 04/15/24  Yes Neaveh Belanger, Isidro Margo, MD  FARXIGA  10 MG TABS tablet TAKE 1 TABLET BY MOUTH DAILY BEFORE BREAKFAST. 10/24/23  Yes Miyo Aina, Isidro Margo, MD  fluticasone  (FLONASE ) 50 MCG/ACT nasal spray SPRAY 2 SPRAYS INTO EACH NOSTRIL EVERY DAY Patient taking differently: Place 2 sprays into both nostrils daily. 01/11/21  Yes Just, Kelsea J, FNP  Lancets [facility] ULTRASOFT) lancets Use as instructed 06/13/22  Yes Zorita Hiss, NP  lisinopril  (ZESTRIL ) 20 MG tablet TAKE 1 TABLET BY MOUTH EVERY DAY 04/30/23  Yes Janiyah Beery, Isidro Margo, MD  metFORMIN  (GLUCOPHAGE ) 1000 MG tablet TAKE 1 TABLET (1,000 MG TOTAL) BY MOUTH TWICE A DAY WITH FOOD 09/14/23  Yes Kenaz Olafson Jose, MD  metoprolol  succinate (TOPROL -XL) 25 MG 24 hr tablet TAKE 1 TABLET BY MOUTH EVERY DAY 01/18/24  Yes Kayana Thoen Jose, MD  [facility] ULTRA TEST test strip USE AS INSTRUCTED 09/01/23  Yes Amyr Sluder, Isidro Margo, MD   Armodafinil  50 MG tablet TAKE 2 TABLETS BY MOUTH EVERY DAY Patient not taking: Reported on 05/26/2024 11/06/22   Margaretann Sharper, MD  Testosterone  Enanthate (XYOSTED ) 75 MG/0.5ML SOAJ Inject 75 mg into the skin once a week. Patient not taking: Reported on 05/26/2024 04/14/24   Elvira Hammersmith, MD    Allergies  Allergen Reactions   Doxycycline  Hives   Erythromycin Base Hives   Glipizide  Shortness Of Breath    Shakey    Armodafinil  Palpitations    In high doses per patient.     Patient Active Problem List   Diagnosis Date Noted   Chronic depression 06/24/2023   Chronic right shoulder pain 04/27/2021   Erectile dysfunction 04/26/2021   Body mass index (BMI) of 40.1-44.9 in adult [facility]) 07/17/2020   S/P arthroscopy of left shoulder 02/01/2020   Impingement syndrome of left shoulder 01/14/2020   Hypertension associated with diabetes (HCC) 09/27/2019   Morbid obesity (HCC) 09/27/2019   Dyslipidemia 09/09/2018   Environmental allergies 09/09/2018   BMI 39.0-39.9,adult 09/03/2017   Dyslipidemia associated with type 2 diabetes mellitus (HCC) 09/03/2017   Anemia 04/14/2017   Hyperlipidemia 05/02/2016   OSA (obstructive sleep apnea) 09/06/2015   Benign essential HTN 09/06/2015    Past Medical History:  Diagnosis Date   Allergy    Diabetes mellitus without complication (HCC)    Elevated cholesterol  Hernia    bi lateral hernia repair   History of kidney stones    Hypertension    Sleep apnea 2014   severe   Umbilical hernia without obstruction and without gangrene 09/03/2017    Past Surgical History:  Procedure Laterality Date   CARPAL TUNNEL RELEASE Right 2007   CARPAL TUNNEL RELEASE Left 10/28/2014   Procedure: LEFT CARPAL TUNNEL RELEASE;  Surgeon: Florida Hurter, MD;  Location: [facility];  Service: Orthopedics;  Laterality: Left;   FOOT SURGERY Left    INGUINAL HERNIA REPAIR     times two, each side   KIDNEY STONE SURGERY     MASS EXCISION  Left 10/28/2014   Procedure: LEFT WRIST VOLAR MASS EXCISION;  Surgeon: Florida Hurter, MD;  Location: [facility];  Service: Orthopedics;  Laterality: Left;   UMBILICAL HERNIA REPAIR N/A 10/27/2017   Procedure: HERNIA REPAIR UMBILICAL ADULT;  Surgeon: Adalberto Acton, MD;  Location: [facility];  Service: General;  Laterality: N/A;    Social History   Socioeconomic History   Marital status: Married    Spouse name: Landy Pinna   Number of children: 2   Years of education: Associates   Highest education level: Associate degree: occupational, Scientist, product/process development, or vocational program  Occupational History   Occupation: Merchandiser, retail    Comment: [employer]  Tobacco Use   Smoking status: Never   Smokeless tobacco: Never  Vaping Use   Vaping status: Never Used  Substance and Sexual Activity   Alcohol use: No    Alcohol/week: 0.0 standard drinks of alcohol   Drug use: No   Sexual activity: Yes    Partners: Female    Birth control/protection: Condom  Other Topics Concern   Not on file  Social History Narrative   Lives with his wife and their 2 children.   He has 3 sons from a previous relationship that live independently.   Formerly in the [employer].   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   Social Drivers of Corporate investment banker Strain: Low Risk  (06/22/2023)   Overall Financial Resource Strain (CARDIA)    Difficulty of Paying Living Expenses: Not hard at all  Food Insecurity: No Food Insecurity (06/22/2023)   Hunger Vital Sign    Worried About Running Out of Food in the Last Year: Never true    Ran Out of Food in the Last Year: Never true  Transportation Needs: No Transportation Needs (06/22/2023)   PRAPARE - Administrator, Civil Service (Medical): No    Lack of Transportation (Non-Medical): No  Physical Activity: Insufficiently Active (06/22/2023)   Exercise Vital Sign     Days of Exercise per Week: 1 day    Minutes of Exercise per Session: 30 min  Stress: Stress Concern Present (06/22/2023)   [employer] of Occupational Health - Occupational Stress Questionnaire    Feeling of Stress : Rather much  Social Connections: Unknown (07/05/2023)   Received from [facility]   Social Network    Social Network: Not on file  Intimate Partner Violence: Unknown (07/05/2023)   Received from [facility]   HITS    Physically Hurt: Not on file    Insult or Talk Down To: Not on file    Threaten Physical Harm: Not on file    Scream or Curse: Not on file    Family History  Problem Relation  Age of Onset   Diabetes Mother    Stroke Mother        4 strokes   Heart attack Mother    Heart disease Mother    Hypertension Father    Hypertension Sister    Heart disease Sister        pacemaker     Review of Systems  Constitutional: Negative.  Negative for chills and fever.  HENT: Negative.  Negative for congestion and sore throat.   Respiratory: Negative.  Negative for cough and shortness of breath.   Cardiovascular: Negative.  Negative for chest pain and palpitations.  Gastrointestinal:  Positive for constipation and heartburn. Negative for vomiting.  Genitourinary: Negative.  Negative for dysuria and hematuria.  Skin: Negative.  Negative for rash.  Neurological: Negative.  Negative for dizziness and headaches.  All other systems reviewed and are negative.   Vitals:   05/26/24 0833  BP: 122/84  Pulse: 86  Temp: 98.5 F (36.9 C)  SpO2: 95%    Physical Exam Vitals reviewed.  Constitutional:      Appearance: Normal appearance.  HENT:     Head: Normocephalic.     Mouth/Throat:     Mouth: Mucous membranes are moist.     Pharynx: Oropharynx is clear.  Eyes:     Extraocular Movements: Extraocular movements intact.     Conjunctiva/sclera: Conjunctivae normal.     Pupils: Pupils are equal, round, and reactive to light.  Cardiovascular:     Rate  and Rhythm: Normal rate and regular rhythm.     Pulses: Normal pulses.     Heart sounds: Normal heart sounds.  Pulmonary:     Effort: Pulmonary effort is normal.     Breath sounds: Normal breath sounds.  Musculoskeletal:     Cervical back: No tenderness.  Lymphadenopathy:     Cervical: No cervical adenopathy.  Skin:    General: Skin is warm and dry.     Capillary Refill: Capillary refill takes less than 2 seconds.  Neurological:     General: No focal deficit present.     Mental Status: He is alert and oriented to person, place, and time.  Psychiatric:        Mood and Affect: Mood normal.        Behavior: Behavior normal.      ASSESSMENT & PLAN: A total of 42 minutes was spent with the patient and counseling/coordination of care regarding preparing for this visit, review of most recent office visit notes, review of multiple chronic medical conditions and their management, review of all medications, review of most recent bloodwork results, review of health maintenance items, education on nutrition, prognosis, documentation, and need for follow up.   Problem List Items Addressed This Visit       Cardiovascular and Mediastinum   Hypertension associated with diabetes (HCC) - Primary   Well-controlled hypertension Continue lisinopril  20 mg daily and metoprolol  succinate 25 mg daily Hemoglobin A1c higher than before at 7.5 Cardiovascular risks associated with hypertension and diabetes discussed Diet and nutrition discussed Continues Farxiga  10 mg daily, weekly Trulicity  3 mg, daily metformin  1000 mg twice a day Follow-up in 6 months      Relevant Medications   dapagliflozin  propanediol (FARXIGA ) 10 MG TABS tablet   Other Relevant Orders   CBC with Differential/Platelet (Completed)   Comprehensive metabolic panel with GFR (Completed)   Hemoglobin A1c (Completed)   Lipid panel (Completed)     Endocrine   Dyslipidemia associated with type 2  diabetes mellitus (HCC)   Chronic  stable conditions Diet and nutrition discussed Continue atorvastatin  40 mg daily.        Relevant Medications   dapagliflozin  propanediol (FARXIGA ) 10 MG TABS tablet   Other Relevant Orders   CBC with Differential/Platelet (Completed)   Comprehensive metabolic panel with GFR (Completed)   Hemoglobin A1c (Completed)   Lipid panel (Completed)     Other   Morbid obesity (HCC)   Diet and nutrition discussed Benefits of exercise discussed Advised to decrease amount of daily carbohydrate intake and daily calories and increase amount of plant-based protein in his diet      Relevant Medications   dapagliflozin  propanediol (FARXIGA ) 10 MG TABS tablet   Other Relevant Orders   CBC with Differential/Platelet (Completed)   Comprehensive metabolic panel with GFR (Completed)   Hemoglobin A1c (Completed)   Lipid panel (Completed)   Other Visit Diagnoses       Screening for colon cancer       Relevant Orders   Ambulatory referral to Gastroenterology        Patient Instructions  Health Maintenance, Male Adopting a healthy lifestyle and getting preventive care are important in promoting health and wellness. Ask your health care provider about: The right schedule for you to have regular tests and exams. Things you can do on your own to prevent diseases and keep yourself healthy. What should I know about diet, weight, and exercise? Eat a healthy diet  Eat a diet that includes plenty of vegetables, fruits, low-fat dairy products, and lean protein. Do not eat a lot of foods that are high in solid fats, added sugars, or sodium. Maintain a healthy weight Body mass index (BMI) is a measurement that can be used to identify possible weight problems. It estimates body fat based on height and weight. Your health care provider can help determine your BMI and help you achieve or maintain a healthy weight. Get regular exercise Get regular exercise. This is one of the most important things you can  do for your health. Most adults should: Exercise for at least 150 minutes each week. The exercise should increase your heart rate and make you sweat (moderate-intensity exercise). Do strengthening exercises at least twice a week. This is in addition to the moderate-intensity exercise. Spend less time sitting. Even light physical activity can be beneficial. Watch cholesterol and blood lipids Have your blood tested for lipids and cholesterol at 54 years of age, then have this test every 5 years. You may need to have your cholesterol levels checked more often if: Your lipid or cholesterol levels are high. You are older than 54 years of age. You are at high risk for heart disease. What should I know about cancer screening? Many types of cancers can be detected early and may often be prevented. Depending on your health history and family history, you may need to have cancer screening at various ages. This may include screening for: Colorectal cancer. Prostate cancer. Skin cancer. Lung cancer. What should I know about heart disease, diabetes, and high blood pressure? Blood pressure and heart disease High blood pressure causes heart disease and increases the risk of stroke. This is more likely to develop in people who have high blood pressure readings or are overweight. Talk with your health care provider about your target blood pressure readings. Have your blood pressure checked: Every 3-5 years if you are 21-64 years of age. Every year if you are 25 years old or older. If you are  between the ages of 21 and 66 and are a current or former smoker, ask your health care provider if you should have a one-time screening for abdominal aortic aneurysm (AAA). Diabetes Have regular diabetes screenings. This checks your fasting blood sugar level. Have the screening done: Once every three years after age 27 if you are at a normal weight and have a low risk for diabetes. More often and at a younger age if  you are overweight or have a high risk for diabetes. What should I know about preventing infection? Hepatitis B If you have a higher risk for hepatitis B, you should be screened for this virus. Talk with your health care provider to find out if you are at risk for hepatitis B infection. Hepatitis C Blood testing is recommended for: Everyone born from [birth year] through 1966. Anyone with known risk factors for hepatitis C. Sexually transmitted infections (STIs) You should be screened each year for STIs, including gonorrhea and chlamydia, if: You are sexually active and are younger than 54 years of age. You are older than 54 years of age and your health care provider tells you that you are at risk for this type of infection. Your sexual activity has changed since you were last screened, and you are at increased risk for chlamydia or gonorrhea. Ask your health care provider if you are at risk. Ask your health care provider about whether you are at high risk for HIV. Your health care provider may recommend a prescription medicine to help prevent HIV infection. If you choose to take medicine to prevent HIV, you should first get tested for HIV. You should then be tested every 3 months for as long as you are taking the medicine. Follow these instructions at home: Alcohol use Do not drink alcohol if your health care provider tells you not to drink. If you drink alcohol: Limit how much you have to 0-2 drinks a day. Know how much alcohol is in your drink. In the [place] one drink equals one 12 oz bottle of beer (355 mL), one 5 oz glass of wine (148 mL), or one 1 oz glass of hard liquor (44 mL). Lifestyle Do not use any products that contain nicotine or tobacco. These products include cigarettes, chewing tobacco, and vaping devices, such as e-cigarettes. If you need help quitting, ask your health care provider. Do not use street drugs. Do not share needles. Ask your health care provider for help if you need  support or information about quitting drugs. General instructions Schedule regular health, dental, and eye exams. Stay current with your vaccines. Tell your health care provider if: You often feel depressed. You have ever been abused or do not feel safe at home. Summary Adopting a healthy lifestyle and getting preventive care are important in promoting health and wellness. Follow your health care provider's instructions about healthy diet, exercising, and getting tested or screened for diseases. Follow your health care provider's instructions on monitoring your cholesterol and blood pressure. This information is not intended to replace advice given to you by your health care provider. Make sure you discuss any questions you have with your health care provider. Document Revised: 04/23/2021 Document Reviewed: 04/23/2021 Elsevier Patient Education  2024 Elsevier Inc.     Maryagnes Small, MD [facility]

## 2024-05-26 NOTE — Assessment & Plan Note (Signed)
 Chronic stable conditions Diet and nutrition discussed Continue atorvastatin 40 mg daily.

## 2024-06-16 ENCOUNTER — Encounter: Payer: Self-pay | Admitting: Emergency Medicine

## 2024-06-23 ENCOUNTER — Encounter: Payer: Self-pay | Admitting: Internal Medicine

## 2024-06-28 ENCOUNTER — Encounter: Payer: Self-pay | Admitting: Internal Medicine

## 2024-06-28 ENCOUNTER — Ambulatory Visit (AMBULATORY_SURGERY_CENTER)

## 2024-06-28 VITALS — Ht 67.0 in | Wt 255.0 lb

## 2024-06-28 DIAGNOSIS — Z1211 Encounter for screening for malignant neoplasm of colon: Secondary | ICD-10-CM

## 2024-06-28 MED ORDER — NA SULFATE-K SULFATE-MG SULF 17.5-3.13-1.6 GM/177ML PO SOLN: 1.0000 | Freq: Once | ORAL | 0 refills | Status: AC

## 2024-06-28 NOTE — Progress Notes (Signed)
 No egg or soy allergy known to patient  No issues known to pt with past sedation with any surgeries or procedures Patient denies ever being told they had issues or difficulty with intubation  No FH of Malignant Hyperthermia Pt is not on diet pills Pt is not on  home 02. OSA using CPAP  Pt is not on blood thinners  constipation on occasion No A fib or A flutter Have any cardiac testing pending--no  LOA: independent  Prep: suprep   Patient's chart reviewed by Norleen Schillings CNRA prior to previsit and patient appropriate for the [facility].  Previsit completed and red dot placed by patient's name on their procedure day (on provider's schedule).     PV completed with patient. Prep instructions sent via mychart and home address.

## 2024-06-28 NOTE — Patient Instructions (Signed)
 ORAL DIABETIC MEDICATION INSTRUCTIONS Metformin  Glipizide  Jardiance Glimepiride Farixga Januvia  Rybelsus, Xigduo, Sitagliptin, Synjardy Tradjenta Actos, Alogliptin Invokana  The day before your procedure: 7/21  Do not take your diabetic pill   The day of your procedure: 7/22  Do not take your diabetic pill   We will check your blood sugar levels during the admission process and again in Recovery before discharging you home  _____________________________________________________________________________   ONCE A WEEK INJECTIONS Ozempic ,  Mounjaro , Wegovy, Trulicity , Tanzeum, Byetta, Victoza, Bydureon, & SymlinPen  -DO NOT TAKE 7 days prior to the procedure.  Last dose on or before Monday 06/28/24 failure to hold this medication will result in a cancellation or rescheduling of your procedure  _____________________________________________________________________________

## 2024-07-04 ENCOUNTER — Other Ambulatory Visit: Payer: Self-pay | Admitting: Emergency Medicine

## 2024-07-04 DIAGNOSIS — I152 Hypertension secondary to endocrine disorders: Secondary | ICD-10-CM

## 2024-07-05 ENCOUNTER — Other Ambulatory Visit: Payer: Self-pay | Admitting: Emergency Medicine

## 2024-07-05 DIAGNOSIS — E1159 Type 2 diabetes mellitus with other circulatory complications: Secondary | ICD-10-CM

## 2024-07-05 DIAGNOSIS — E1169 Type 2 diabetes mellitus with other specified complication: Secondary | ICD-10-CM

## 2024-07-06 ENCOUNTER — Encounter: Payer: Self-pay | Admitting: Internal Medicine

## 2024-07-06 ENCOUNTER — Ambulatory Visit (AMBULATORY_SURGERY_CENTER): Admitting: Internal Medicine

## 2024-07-06 VITALS — BP 113/67 | HR 73 | Temp 98.1°F | Resp 11 | Ht 67.0 in | Wt 255.0 lb

## 2024-07-06 DIAGNOSIS — Z1211 Encounter for screening for malignant neoplasm of colon: Secondary | ICD-10-CM

## 2024-07-06 MED ORDER — SODIUM CHLORIDE 0.9 % IV SOLN: 500.0000 mL | Freq: Once | INTRAVENOUS | Status: DC

## 2024-07-06 NOTE — Progress Notes (Signed)
 Pt's states no medical or surgical changes since previsit or office visit.

## 2024-07-06 NOTE — Progress Notes (Signed)
 Sedate, gd SR, tolerated procedure well, VSS, report to RN

## 2024-07-06 NOTE — Op Note (Signed)
 [facility] Patient Name: Walter Reynolds Procedure Date: 07/06/2024 9:03 AM MRN: [id number] Endoscopist: Lupita FORBES Commander , MD, [id number] Age: 53 Referring MD:  Date of Birth: Nov 10, 1971 Gender: Male Account #: [id number] Procedure:                Colonoscopy Indications:              Screening for colorectal malignant neoplasm, This                            is the patient's first colonoscopy Medicines:                Monitored Anesthesia Care Procedure:                Pre-Anesthesia Assessment:                           - Prior to the procedure, a History and Physical                            was performed, and patient medications and                            allergies were reviewed. The patient's tolerance of                            previous anesthesia was also reviewed. The risks                            and benefits of the procedure and the sedation                            options and risks were discussed with the patient.                            All questions were answered, and informed consent                            was obtained. Prior Anticoagulants: The patient has                            taken no anticoagulant or antiplatelet agents. ASA                            Grade Assessment: II - A patient with mild systemic                            disease. After reviewing the risks and benefits,                            the patient was deemed in satisfactory condition to                            undergo the procedure.  After obtaining informed consent, the colonoscope                            was passed under direct vision. Throughout the                            procedure, the patient's blood pressure, pulse, and                            oxygen saturations were monitored continuously. The                            CF [id number] #[id number] was introduced through the anus                            and advanced to the  the cecum, identified by                            appendiceal orifice and ileocecal valve. The                            colonoscopy was performed without difficulty. The                            patient tolerated the procedure well. The quality                            of the bowel preparation was good. The ileocecal                            valve, appendiceal orifice, and rectum were                            photographed. The bowel preparation used was SUPREP                            via split dose instruction. Scope In: 9:24:58 AM Scope Out: 9:36:10 AM Scope Withdrawal Time: 0 hours 7 minutes 51 seconds  Total Procedure Duration: 0 hours 11 minutes 12 seconds  Findings:                 The perianal and digital rectal examinations were                            normal. Pertinent negatives include normal prostate                            (size, shape, and consistency).                           The entire examined colon appeared normal on direct                            and retroflexion views. Complications:  No immediate complications. Estimated Blood Loss:     Estimated blood loss: none. Impression:               - The entire examined colon is normal on direct and                            retroflexion views.                           - No specimens collected. Recommendation:           - Patient has a contact number available for                            emergencies. The signs and symptoms of potential                            delayed complications were discussed with the                            patient. Return to normal activities tomorrow.                            Written discharge instructions were provided to the                            patient.                           - Resume previous diet.                           - Continue present medications.                           - Repeat colonoscopy in 10 years for screening                             purposes. Lupita FORBES Commander, MD 07/06/2024 9:43:20 AM This report has been signed electronically.

## 2024-07-06 NOTE — Progress Notes (Signed)
 [facility] History and Physical   Primary Care Physician:  Purcell Emil Schanz, MD   Reason for Procedure:    Encounter Diagnosis  Name Primary?   Special screening for malignant neoplasms, colon Yes     Plan:    colonoscopy     HPI: Walter Reynolds. is a 53 y.o. male  for screening colonoscopy   Past Medical History:  Diagnosis Date   Allergy    Diabetes mellitus without complication (HCC)    Elevated cholesterol    Hernia    bi lateral hernia repair   History of kidney stones    Hypertension    Sleep apnea 2014   severe   Umbilical hernia without obstruction and without gangrene 09/03/2017    Past Surgical History:  Procedure Laterality Date   CARPAL TUNNEL RELEASE Right 2007   CARPAL TUNNEL RELEASE Left 10/28/2014   Procedure: LEFT CARPAL TUNNEL RELEASE;  Surgeon: Donnice Robinsons, MD;  Location: [facility];  Service: Orthopedics;  Laterality: Left;   FOOT SURGERY Left    INGUINAL HERNIA REPAIR     times two, each side   KIDNEY STONE SURGERY     MASS EXCISION Left 10/28/2014   Procedure: LEFT WRIST VOLAR MASS EXCISION;  Surgeon: Donnice Robinsons, MD;  Location: [facility];  Service: Orthopedics;  Laterality: Left;   UMBILICAL HERNIA REPAIR N/A 10/27/2017   Procedure: HERNIA REPAIR UMBILICAL ADULT;  Surgeon: Signe Mitzie LABOR, MD;  Location: [facility] OR;  Service: General;  Laterality: N/A;      Current Outpatient Medications  Medication Sig Dispense Refill   aspirin  EC 81 MG EC tablet Take 1 tablet (81 mg total) by mouth daily. 30 tablet 0   atorvastatin  (LIPITOR) 40 MG tablet TAKE 1 TABLET BY MOUTH EVERY DAY 90 tablet 3   dapagliflozin  propanediol (FARXIGA ) 10 MG TABS tablet Take 1 tablet (10 mg total) by mouth daily before breakfast. 90 tablet 3   FARXIGA  10 MG TABS tablet TAKE 1 TABLET BY MOUTH DAILY BEFORE BREAKFAST. 90 tablet 3   Lancets (ONETOUCH ULTRASOFT) lancets Use as instructed 100 each 12   lisinopril   (ZESTRIL ) 20 MG tablet TAKE 1 TABLET BY MOUTH EVERY DAY 90 tablet 1   metFORMIN  (GLUCOPHAGE ) 1000 MG tablet TAKE 1 TABLET (1,000 MG TOTAL) BY MOUTH TWICE A DAY WITH FOOD 180 tablet 1   metoprolol  succinate (TOPROL -XL) 25 MG 24 hr tablet TAKE 1 TABLET BY MOUTH EVERY DAY 90 tablet 3   ONETOUCH ULTRA TEST test strip USE AS INSTRUCTED 100 strip 12   fluticasone  (FLONASE ) 50 MCG/ACT nasal spray SPRAY 2 SPRAYS INTO EACH NOSTRIL EVERY DAY (Patient taking differently: Place 2 sprays into both nostrils as needed. SPRAY 2 SPRAYS INTO EACH NOSTRIL EVERY DAY) 48 mL 12   Testosterone  Enanthate (XYOSTED ) 75 MG/0.5ML SOAJ Inject 75 mg into the skin once a week. (Patient not taking: Reported on 06/28/2024) 2 mL 5   TRULICITY  3 MG/0.5ML SOAJ INJECT 3MG  SUBCUTANEOUSLY ONCE A WEEK 12 mL 0   Current Facility-Administered Medications  Medication Dose Route Frequency Provider Last Rate Last Admin   0.9 %  sodium chloride  infusion  500 mL Intravenous Once Avram Lupita BRAVO, MD        Allergies as of 07/06/2024 - Review Complete 07/06/2024  Allergen Reaction Noted   Doxycycline  Hives 06/01/2015   Erythromycin base Hives 10/16/2012   Glipizide  Shortness Of Breath 07/17/2020   Armodafinil  Palpitations 01/03/2022    Family History  Problem Relation  Age of Onset   Diabetes Mother    Stroke Mother        4 strokes   Heart attack Mother    Heart disease Mother    Hypertension Father    Hypertension Sister    Heart disease Sister        pacemaker   Colon cancer Neg Hx    Rectal cancer Neg Hx    Stomach cancer Neg Hx     Social History   Socioeconomic History   Marital status: Married    Spouse name: DIARRA KOS   Number of children: 2   Years of education: Associates   Highest education level: Associate degree: occupational, Scientist, product/process development, or vocational program  Occupational History   Occupation: Merchandiser, retail    Comment: [employer]  Tobacco Use   Smoking status: Never   Smokeless tobacco: Never  Vaping  Use   Vaping status: Never Used  Substance and Sexual Activity   Alcohol use: No    Alcohol/week: 0.0 standard drinks of alcohol   Drug use: No   Sexual activity: Yes    Partners: Female    Birth control/protection: Condom  Other Topics Concern   Not on file  Social History Narrative   Lives with his wife and their 2 children.   He has 3 sons from a previous relationship that live independently.   Formerly in the [employer].   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   Social Drivers of Corporate investment banker Strain: Low Risk  (06/22/2023)   Overall Financial Resource Strain (CARDIA)    Difficulty of Paying Living Expenses: Not hard at all  Food Insecurity: No Food Insecurity (06/22/2023)   Hunger Vital Sign    Worried About Running Out of Food in the Last Year: Never true    Ran Out of Food in the Last Year: Never true  Transportation Needs: No Transportation Needs (06/22/2023)   PRAPARE - Administrator, Civil Service (Medical): No    Lack of Transportation (Non-Medical): No  Physical Activity: Insufficiently Active (06/22/2023)   Exercise Vital Sign    Days of Exercise per Week: 1 day    Minutes of Exercise per Session: 30 min  Stress: Stress Concern Present (06/22/2023)   [employer] of Occupational Health - Occupational Stress Questionnaire    Feeling of Stress : Rather much  Social Connections: Unknown (07/05/2023)   Received from [facility]   Social Network    Social Network: Not on file  Intimate Partner Violence: Unknown (07/05/2023)   Received from [facility]   HITS    Physically Hurt: Not on file    Insult or Talk Down To: Not on file    Threaten Physical Harm: Not on file    Scream or Curse: Not on file    Review of Systems:  All other review of systems negative except as mentioned in the HPI.  Physical Exam: Vital signs BP 105/68   Pulse 67    Temp 98.1 F (36.7 C) (Skin)   Resp 12   Ht 5' 7 (1.702 m)   Wt 255 lb (115.7 kg)   SpO2 99%   BMI 39.94 kg/m   General:   Alert,  Well-developed, well-nourished, pleasant and cooperative in NAD Lungs:  Clear throughout to auscultation.   Heart:  Regular rate and rhythm; no murmurs, clicks, rubs,  or gallops. Abdomen:  Soft,  nontender and nondistended. Normal bowel sounds.   Neuro/Psych:  Alert and cooperative. Normal mood and affect. A and O x 3   @Keelee Yankey  CHARLENA Commander, MD, [facility] [phone] (pager) 07/06/2024 9:16 AM@

## 2024-07-06 NOTE — Patient Instructions (Addendum)
 Colonoscopy exam was normal today.  No polyps or cancer were seen.  Next routine colonoscopy or other screening test in 10 years - 2035.  I appreciate the opportunity to care for you. Lupita CHARLENA Commander, MD, FACG  YOU HAD AN ENDOSCOPIC PROCEDURE TODAY AT THE [facility]:   Refer to the procedure report that was given to you for any specific questions about what was found during the examination.  If the procedure report does not answer your questions, please call your gastroenterologist to clarify.  If you requested that your care partner not be given the details of your procedure findings, then the procedure report has been included in a sealed envelope for you to review at your convenience later.  YOU SHOULD EXPECT: Some feelings of bloating in the abdomen. Passage of more gas than usual.  Walking can help get rid of the air that was put into your GI tract during the procedure and reduce the bloating. If you had a lower endoscopy (such as a colonoscopy or flexible sigmoidoscopy) you may notice spotting of blood in your stool or on the toilet paper. If you underwent a bowel prep for your procedure, you may not have a normal bowel movement for a few days.  Please Note:  You might notice some irritation and congestion in your nose or some drainage.  This is from the oxygen used during your procedure.  There is no need for concern and it should clear up in a day or so.  SYMPTOMS TO REPORT IMMEDIATELY:  Following lower endoscopy (colonoscopy or flexible sigmoidoscopy):  Excessive amounts of blood in the stool  Significant tenderness or worsening of abdominal pains  Swelling of the abdomen that is new, acute  Fever of 100F or higher  For urgent or emergent issues, a gastroenterologist can be reached at any hour by calling [phone]. Do not use MyChart messaging for urgent concerns.    DIET:  We do recommend a small meal at first, but then you may proceed to your regular diet.   Drink plenty of fluids but you should avoid alcoholic beverages for 24 hours.  ACTIVITY:  You should plan to take it easy for the rest of today and you should NOT DRIVE or use heavy machinery until tomorrow (because of the sedation medicines used during the test).    FOLLOW UP: Our staff will call the number listed on your records the next business day following your procedure.  We will call around 7:15- 8:00 am to check on you and address any questions or concerns that you may have regarding the information given to you following your procedure. If we do not reach you, we will leave a message.     If any biopsies were taken you will be contacted by phone or by letter within the next 1-3 weeks.  Please call us  at [phone] if you have not heard about the biopsies in 3 weeks.    SIGNATURES/CONFIDENTIALITY: You and/or your care partner have signed paperwork which will be entered into your electronic medical record.  These signatures attest to the fact that that the information above on your After Visit Summary has been reviewed and is understood.  Full responsibility of the confidentiality of this discharge information lies with you and/or your care-partner.

## 2024-07-07 ENCOUNTER — Telehealth: Payer: Self-pay

## 2024-07-07 NOTE — Telephone Encounter (Signed)
 Attempted to reach patient for follow up phone call. No answer, left voicemail to contact Dr. Jadene Maxwell office with any questions or concerns.

## 2024-07-21 NOTE — Telephone Encounter (Signed)
 Walter Reynolds

## 2024-07-21 NOTE — Telephone Encounter (Signed)
 SABRA

## 2024-07-22 ENCOUNTER — Encounter: Payer: Self-pay | Admitting: Emergency Medicine

## 2024-07-22 ENCOUNTER — Ambulatory Visit: Admitting: Emergency Medicine

## 2024-07-22 VITALS — BP 116/78 | HR 82 | Temp 98.3°F | Ht 67.0 in | Wt 253.0 lb

## 2024-07-22 DIAGNOSIS — N5201 Erectile dysfunction due to arterial insufficiency: Secondary | ICD-10-CM

## 2024-07-22 DIAGNOSIS — E1169 Type 2 diabetes mellitus with other specified complication: Secondary | ICD-10-CM

## 2024-07-22 DIAGNOSIS — G4733 Obstructive sleep apnea (adult) (pediatric): Secondary | ICD-10-CM

## 2024-07-22 DIAGNOSIS — E1159 Type 2 diabetes mellitus with other circulatory complications: Secondary | ICD-10-CM | POA: Diagnosis not present

## 2024-07-22 DIAGNOSIS — I152 Hypertension secondary to endocrine disorders: Secondary | ICD-10-CM

## 2024-07-22 DIAGNOSIS — E785 Hyperlipidemia, unspecified: Secondary | ICD-10-CM

## 2024-07-22 DIAGNOSIS — Z23 Encounter for immunization: Secondary | ICD-10-CM

## 2024-07-22 NOTE — Assessment & Plan Note (Signed)
 Chronic stable conditions Diet and nutrition discussed Continue atorvastatin 40 mg daily.

## 2024-07-22 NOTE — Patient Instructions (Signed)
 Health Maintenance, Male  Adopting a healthy lifestyle and getting preventive care are important in promoting health and wellness. Ask your health care provider about:  The right schedule for you to have regular tests and exams.  Things you can do on your own to prevent diseases and keep yourself healthy.  What should I know about diet, weight, and exercise?  Eat a healthy diet    Eat a diet that includes plenty of vegetables, fruits, low-fat dairy products, and lean protein.  Do not eat a lot of foods that are high in solid fats, added sugars, or sodium.  Maintain a healthy weight  Body mass index (BMI) is a measurement that can be used to identify possible weight problems. It estimates body fat based on height and weight. Your health care provider can help determine your BMI and help you achieve or maintain a healthy weight.  Get regular exercise  Get regular exercise. This is one of the most important things you can do for your health. Most adults should:  Exercise for at least 150 minutes each week. The exercise should increase your heart rate and make you sweat (moderate-intensity exercise).  Do strengthening exercises at least twice a week. This is in addition to the moderate-intensity exercise.  Spend less time sitting. Even light physical activity can be beneficial.  Watch cholesterol and blood lipids  Have your blood tested for lipids and cholesterol at 54 years of age, then have this test every 5 years.  You may need to have your cholesterol levels checked more often if:  Your lipid or cholesterol levels are high.  You are older than 54 years of age.  You are at high risk for heart disease.  What should I know about cancer screening?  Many types of cancers can be detected early and may often be prevented. Depending on your health history and family history, you may need to have cancer screening at various ages. This may include screening for:  Colorectal cancer.  Prostate cancer.  Skin cancer.  Lung  cancer.  What should I know about heart disease, diabetes, and high blood pressure?  Blood pressure and heart disease  High blood pressure causes heart disease and increases the risk of stroke. This is more likely to develop in people who have high blood pressure readings or are overweight.  Talk with your health care provider about your target blood pressure readings.  Have your blood pressure checked:  Every 3-5 years if you are 9-95 years of age.  Every year if you are 85 years old or older.  If you are between the ages of 29 and 29 and are a current or former smoker, ask your health care provider if you should have a one-time screening for abdominal aortic aneurysm (AAA).  Diabetes  Have regular diabetes screenings. This checks your fasting blood sugar level. Have the screening done:  Once every three years after age 23 if you are at a normal weight and have a low risk for diabetes.  More often and at a younger age if you are overweight or have a high risk for diabetes.  What should I know about preventing infection?  Hepatitis B  If you have a higher risk for hepatitis B, you should be screened for this virus. Talk with your health care provider to find out if you are at risk for hepatitis B infection.  Hepatitis C  Blood testing is recommended for:  Everyone born from [birth year] through 1966.  Anyone  with known risk factors for hepatitis C.  Sexually transmitted infections (STIs)  You should be screened each year for STIs, including gonorrhea and chlamydia, if:  You are sexually active and are younger than 54 years of age.  You are older than 54 years of age and your health care provider tells you that you are at risk for this type of infection.  Your sexual activity has changed since you were last screened, and you are at increased risk for chlamydia or gonorrhea. Ask your health care provider if you are at risk.  Ask your health care provider about whether you are at high risk for HIV. Your health care provider  may recommend a prescription medicine to help prevent HIV infection. If you choose to take medicine to prevent HIV, you should first get tested for HIV. You should then be tested every 3 months for as long as you are taking the medicine.  Follow these instructions at home:  Alcohol use  Do not drink alcohol if your health care provider tells you not to drink.  If you drink alcohol:  Limit how much you have to 0-2 drinks a day.  Know how much alcohol is in your drink. In the [place] one drink equals one 12 oz bottle of beer (355 mL), one 5 oz glass of wine (148 mL), or one 1 oz glass of hard liquor (44 mL).  Lifestyle  Do not use any products that contain nicotine or tobacco. These products include cigarettes, chewing tobacco, and vaping devices, such as e-cigarettes. If you need help quitting, ask your health care provider.  Do not use street drugs.  Do not share needles.  Ask your health care provider for help if you need support or information about quitting drugs.  General instructions  Schedule regular health, dental, and eye exams.  Stay current with your vaccines.  Tell your health care provider if:  You often feel depressed.  You have ever been abused or do not feel safe at home.  Summary  Adopting a healthy lifestyle and getting preventive care are important in promoting health and wellness.  Follow your health care provider's instructions about healthy diet, exercising, and getting tested or screened for diseases.  Follow your health care provider's instructions on monitoring your cholesterol and blood pressure.  This information is not intended to replace advice given to you by your health care provider. Make sure you discuss any questions you have with your health care provider.  Document Revised: 04/23/2021 Document Reviewed: 04/23/2021  Elsevier Patient Education  2024 [employer].

## 2024-07-22 NOTE — Progress Notes (Signed)
 Walter Reynolds. 53 y.o.   Chief Complaint  Patient presents with   Referral    Patient here for urology referral. The last referral for urology he was never called to get something scheduled. Patient states the Testosterone  was $600+ and is not able to start this.     HISTORY OF PRESENT ILLNESS: This is a 53 y.o. male with history of erectile dysfunction unresponsive to oral medications Needs referral to urologist Also has a question about bacterial infection.  Wife recently diagnosed with bacterial vaginosis.  Wants to know if he needs medications or not. Asymptomatic.  Denies dysuria or hematuria.  No penile discharge. No other complaints or medical concerns today.   HPI   Prior to Admission medications   Medication Sig Start Date End Date Taking? Authorizing Provider  aspirin  EC 81 MG EC tablet Take 1 tablet (81 mg total) by mouth daily. 04/02/20  Yes Jerri Keys, MD  atorvastatin  (LIPITOR) 40 MG tablet TAKE 1 TABLET BY MOUTH EVERY DAY 10/19/23  Yes Dayjah Selman, Emil Schanz, MD  dapagliflozin  propanediol (FARXIGA ) 10 MG TABS tablet Take 1 tablet (10 mg total) by mouth daily before breakfast. 05/26/24  Yes Salih Williamson, Emil Schanz, MD  fluticasone  (FLONASE ) 50 MCG/ACT nasal spray SPRAY 2 SPRAYS INTO EACH NOSTRIL EVERY DAY Patient taking differently: Place 2 sprays into both nostrils as needed. SPRAY 2 SPRAYS INTO EACH NOSTRIL EVERY DAY 01/11/21  Yes Just, Kelsea J, FNP  Lancets (ONETOUCH ULTRASOFT) lancets Use as instructed 06/13/22  Yes Elnor Lauraine BRAVO, NP  lisinopril  (ZESTRIL ) 20 MG tablet TAKE 1 TABLET BY MOUTH EVERY DAY 07/05/24  Yes Gordon Vandunk, Emil Schanz, MD  metFORMIN  (GLUCOPHAGE ) 1000 MG tablet TAKE 1 TABLET (1,000 MG TOTAL) BY MOUTH TWICE A DAY WITH FOOD 09/14/23  Yes Mckala Pantaleon Jose, MD  metoprolol  succinate (TOPROL -XL) 25 MG 24 hr tablet TAKE 1 TABLET BY MOUTH EVERY DAY 01/18/24  Yes Loyal Rudy Jose, MD  [facility] ULTRA TEST test strip USE AS INSTRUCTED 09/01/23  Yes Purcell Emil Schanz, MD  TRULICITY  3 MG/0.5ML SOAJ INJECT 3MG  SUBCUTANEOUSLY ONCE A WEEK 07/05/24  Yes Sixto Bowdish, Emil Schanz, MD  FARXIGA  10 MG TABS tablet TAKE 1 TABLET BY MOUTH DAILY BEFORE BREAKFAST. Patient not taking: Reported on 07/22/2024 10/24/23   Calani Gick Jose, MD  Testosterone  Enanthate (XYOSTED ) 75 MG/0.5ML SOAJ Inject 75 mg into the skin once a week. Patient not taking: Reported on 07/22/2024 04/14/24   Purcell Emil Schanz, MD    Allergies  Allergen Reactions   Doxycycline  Hives   Erythromycin Base Hives   Glipizide  Shortness Of Breath    Shakey    Armodafinil  Palpitations    In high doses per patient.     Patient Active Problem List   Diagnosis Date Noted   Chronic depression 06/24/2023   Chronic right shoulder pain 04/27/2021   Erectile dysfunction 04/26/2021   Body mass index (BMI) of 40.1-44.9 in adult [facility]) 07/17/2020   S/P arthroscopy of left shoulder 02/01/2020   Impingement syndrome of left shoulder 01/14/2020   Hypertension associated with diabetes (HCC) 09/27/2019   Morbid obesity (HCC) 09/27/2019   Dyslipidemia 09/09/2018   Environmental allergies 09/09/2018   BMI 39.0-39.9,adult 09/03/2017   Dyslipidemia associated with type 2 diabetes mellitus (HCC) 09/03/2017   Anemia 04/14/2017   Hyperlipidemia 05/02/2016   OSA (obstructive sleep apnea) 09/06/2015   Benign essential HTN 09/06/2015    Past Medical History:  Diagnosis Date   Allergy    Diabetes mellitus without complication (HCC)  Elevated cholesterol    Hernia    bi lateral hernia repair   History of kidney stones    Hypertension    Sleep apnea 2014   severe   Umbilical hernia without obstruction and without gangrene 09/03/2017    Past Surgical History:  Procedure Laterality Date   CARPAL TUNNEL RELEASE Right 2007   CARPAL TUNNEL RELEASE Left 10/28/2014   Procedure: LEFT CARPAL TUNNEL RELEASE;  Surgeon: Donnice Robinsons, MD;  Location: [facility];  Service: Orthopedics;   Laterality: Left;   FOOT SURGERY Left    INGUINAL HERNIA REPAIR     times two, each side   KIDNEY STONE SURGERY     MASS EXCISION Left 10/28/2014   Procedure: LEFT WRIST VOLAR MASS EXCISION;  Surgeon: Donnice Robinsons, MD;  Location: [facility];  Service: Orthopedics;  Laterality: Left;   UMBILICAL HERNIA REPAIR N/A 10/27/2017   Procedure: HERNIA REPAIR UMBILICAL ADULT;  Surgeon: Signe Mitzie LABOR, MD;  Location[facility];  Service: General;  Laterality: N/A;    Social History   Socioeconomic History   Marital status: Married    Spouse name: Sharene JAYSON Hoyle   Number of children: 2   Years of education: Associates   Highest education level: Associate degree: occupational, Scientist, product/process development, or vocational program  Occupational History   Occupation: Merchandiser, retail    Comment: [employer]  Tobacco Use   Smoking status: Never   Smokeless tobacco: Never  Vaping Use   Vaping status: Never Used  Substance and Sexual Activity   Alcohol use: No    Alcohol/week: 0.0 standard drinks of alcohol   Drug use: No   Sexual activity: Yes    Partners: Female    Birth control/protection: Condom  Other Topics Concern   Not on file  Social History Narrative   Lives with his wife and their 2 children.   He has 3 sons from a previous relationship that live independently.   Formerly in the [employer].   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   Social Drivers of Corporate investment banker Strain: Low Risk  (06/22/2023)   Overall Financial Resource Strain (CARDIA)    Difficulty of Paying Living Expenses: Not hard at all  Food Insecurity: No Food Insecurity (06/22/2023)   Hunger Vital Sign    Worried About Running Out of Food in the Last Year: Never true    Ran Out of Food in the Last Year: Never true  Transportation Needs: No Transportation Needs (06/22/2023)   PRAPARE - Administrator, Civil Service  (Medical): No    Lack of Transportation (Non-Medical): No  Physical Activity: Insufficiently Active (06/22/2023)   Exercise Vital Sign    Days of Exercise per Week: 1 day    Minutes of Exercise per Session: 30 min  Stress: Stress Concern Present (06/22/2023)   [employer] of Occupational Health - Occupational Stress Questionnaire    Feeling of Stress : Rather much  Social Connections: Unknown (07/05/2023)   Received from [facility]   Social Network    Social Network: Not on file  Intimate Partner Violence: Unknown (07/05/2023)   Received from [facility]   HITS    Physically Hurt: Not on file    Insult or Talk Down To: Not on file    Threaten Physical Harm: Not on file    Scream or Curse: Not on file  Family History  Problem Relation Age of Onset   Diabetes Mother    Stroke Mother        4 strokes   Heart attack Mother    Heart disease Mother    Hypertension Father    Hypertension Sister    Heart disease Sister        pacemaker   Colon cancer Neg Hx    Rectal cancer Neg Hx    Stomach cancer Neg Hx      Review of Systems  Constitutional: Negative.  Negative for chills and fever.  HENT: Negative.  Negative for congestion and sore throat.   Respiratory: Negative.  Negative for cough and shortness of breath.   Cardiovascular: Negative.  Negative for chest pain and palpitations.  Gastrointestinal:  Negative for abdominal pain, diarrhea, nausea and vomiting.  Genitourinary: Negative.  Negative for dysuria and hematuria.  Skin: Negative.  Negative for rash.  Neurological: Negative.  Negative for dizziness and headaches.  All other systems reviewed and are negative.   Vitals:   07/22/24 0856  BP: 116/78  Pulse: 82  Temp: 98.3 F (36.8 C)  SpO2: 98%    Physical Exam Vitals reviewed.  Constitutional:      Appearance: Normal appearance. He is obese.  HENT:     Head: Normocephalic.  Eyes:     Extraocular Movements: Extraocular movements intact.   Cardiovascular:     Rate and Rhythm: Normal rate.  Pulmonary:     Effort: Pulmonary effort is normal.  Skin:    General: Skin is warm and dry.  Neurological:     Mental Status: He is alert and oriented to person, place, and time.  Psychiatric:        Mood and Affect: Mood normal.        Behavior: Behavior normal.      ASSESSMENT & PLAN: A total of 42 minutes was spent with the patient and counseling/coordination of care regarding preparing for this visit, review of most recent office visit notes, review of multiple chronic medical conditions and their management, review of all medications, review of most recent bloodwork results, review of health maintenance items, education on nutrition, prognosis, documentation, and need for follow up.   Problem List Items Addressed This Visit       Cardiovascular and Mediastinum   Hypertension associated with diabetes (HCC)   Well-controlled hypertension Continue lisinopril  20 mg daily and metoprolol  succinate 25 mg daily Hemoglobin A1c higher than before at 7.5 Cardiovascular risks associated with hypertension and diabetes discussed Diet and nutrition discussed Continues Farxiga  10 mg daily, weekly Trulicity  3 mg, daily metformin  1000 mg twice a day Follow-up in 6 months        Respiratory   OSA (obstructive sleep apnea)   Compliant with CPAP and has chronic nasal congestion issues Advised to use saline nasal spray frequently        Endocrine   Dyslipidemia associated with type 2 diabetes mellitus (HCC)   Chronic stable conditions Diet and nutrition discussed Continue atorvastatin  40 mg daily.            Other   Erectile dysfunction - Primary   Active and affecting quality of life Medications not helping Recommend testosterone  checked today Needs urology evaluation Referral placed today.      Relevant Orders   Ambulatory referral to Urology   Other Visit Diagnoses       Need for vaccination       Relevant Orders    Tdap  vaccine greater than or equal to 7yo IM (Completed)      Patient Instructions  Health Maintenance, Male Adopting a healthy lifestyle and getting preventive care are important in promoting health and wellness. Ask your health care provider about: The right schedule for you to have regular tests and exams. Things you can do on your own to prevent diseases and keep yourself healthy. What should I know about diet, weight, and exercise? Eat a healthy diet  Eat a diet that includes plenty of vegetables, fruits, low-fat dairy products, and lean protein. Do not eat a lot of foods that are high in solid fats, added sugars, or sodium. Maintain a healthy weight Body mass index (BMI) is a measurement that can be used to identify possible weight problems. It estimates body fat based on height and weight. Your health care provider can help determine your BMI and help you achieve or maintain a healthy weight. Get regular exercise Get regular exercise. This is one of the most important things you can do for your health. Most adults should: Exercise for at least 150 minutes each week. The exercise should increase your heart rate and make you sweat (moderate-intensity exercise). Do strengthening exercises at least twice a week. This is in addition to the moderate-intensity exercise. Spend less time sitting. Even light physical activity can be beneficial. Watch cholesterol and blood lipids Have your blood tested for lipids and cholesterol at 54 years of age, then have this test every 5 years. You may need to have your cholesterol levels checked more often if: Your lipid or cholesterol levels are high. You are older than 54 years of age. You are at high risk for heart disease. What should I know about cancer screening? Many types of cancers can be detected early and may often be prevented. Depending on your health history and family history, you may need to have cancer screening at various ages. This  may include screening for: Colorectal cancer. Prostate cancer. Skin cancer. Lung cancer. What should I know about heart disease, diabetes, and high blood pressure? Blood pressure and heart disease High blood pressure causes heart disease and increases the risk of stroke. This is more likely to develop in people who have high blood pressure readings or are overweight. Talk with your health care provider about your target blood pressure readings. Have your blood pressure checked: Every 3-5 years if you are 51-24 years of age. Every year if you are 56 years old or older. If you are between the ages of 30 and 38 and are a current or former smoker, ask your health care provider if you should have a one-time screening for abdominal aortic aneurysm (AAA). Diabetes Have regular diabetes screenings. This checks your fasting blood sugar level. Have the screening done: Once every three years after age 74 if you are at a normal weight and have a low risk for diabetes. More often and at a younger age if you are overweight or have a high risk for diabetes. What should I know about preventing infection? Hepatitis B If you have a higher risk for hepatitis B, you should be screened for this virus. Talk with your health care provider to find out if you are at risk for hepatitis B infection. Hepatitis C Blood testing is recommended for: Everyone born from [birth year] through 1966. Anyone with known risk factors for hepatitis C. Sexually transmitted infections (STIs) You should be screened each year for STIs, including gonorrhea and chlamydia, if: You are sexually active and  are younger than 54 years of age. You are older than 54 years of age and your health care provider tells you that you are at risk for this type of infection. Your sexual activity has changed since you were last screened, and you are at increased risk for chlamydia or gonorrhea. Ask your health care provider if you are at risk. Ask your health  care provider about whether you are at high risk for HIV. Your health care provider may recommend a prescription medicine to help prevent HIV infection. If you choose to take medicine to prevent HIV, you should first get tested for HIV. You should then be tested every 3 months for as long as you are taking the medicine. Follow these instructions at home: Alcohol use Do not drink alcohol if your health care provider tells you not to drink. If you drink alcohol: Limit how much you have to 0-2 drinks a day. Know how much alcohol is in your drink. In the [place] one drink equals one 12 oz bottle of beer (355 mL), one 5 oz glass of wine (148 mL), or one 1 oz glass of hard liquor (44 mL). Lifestyle Do not use any products that contain nicotine or tobacco. These products include cigarettes, chewing tobacco, and vaping devices, such as e-cigarettes. If you need help quitting, ask your health care provider. Do not use street drugs. Do not share needles. Ask your health care provider for help if you need support or information about quitting drugs. General instructions Schedule regular health, dental, and eye exams. Stay current with your vaccines. Tell your health care provider if: You often feel depressed. You have ever been abused or do not feel safe at home. Summary Adopting a healthy lifestyle and getting preventive care are important in promoting health and wellness. Follow your health care provider's instructions about healthy diet, exercising, and getting tested or screened for diseases. Follow your health care provider's instructions on monitoring your cholesterol and blood pressure. This information is not intended to replace advice given to you by your health care provider. Make sure you discuss any questions you have with your health care provider. Document Revised: 04/23/2021 Document Reviewed: 04/23/2021 Elsevier Patient Education  2024 Elsevier Inc.     Emil Schaumann, MD [place]  Primary Care at [facility]

## 2024-07-22 NOTE — Assessment & Plan Note (Signed)
 Well-controlled hypertension Continue lisinopril  20 mg daily and metoprolol  succinate 25 mg daily Hemoglobin A1c higher than before at 7.5 Cardiovascular risks associated with hypertension and diabetes discussed Diet and nutrition discussed Continues Farxiga  10 mg daily, weekly Trulicity  3 mg, daily metformin  1000 mg twice a day Follow-up in 6 months

## 2024-07-22 NOTE — Assessment & Plan Note (Signed)
 Compliant with CPAP and has chronic nasal congestion issues Advised to use saline nasal spray frequently

## 2024-07-22 NOTE — Assessment & Plan Note (Signed)
Active and affecting quality of life Medications not helping Recommend testosterone checked today Needs urology evaluation Referral placed today.

## 2024-08-04 ENCOUNTER — Encounter: Payer: Self-pay | Admitting: Emergency Medicine

## 2024-08-05 LAB — HM DIABETES EYE EXAM

## 2024-08-09 NOTE — Telephone Encounter (Signed)
 Copied from CRM #[id number]. Topic: Referral - Status >> Aug 09, 2024 11:56 AM Viola FALCON wrote: *3rd Request* Patient sent MyChart 8/20 and called 07/21/24 regarding referral to Urology - he needs it sent to [facility] as soon as possible. He feels like the office is being dismissive of his concerns/needs. His number is [phone] (M)

## 2024-08-13 ENCOUNTER — Other Ambulatory Visit: Payer: Self-pay

## 2024-08-13 ENCOUNTER — Emergency Department (HOSPITAL_COMMUNITY)
Admission: EM | Admit: 2024-08-13 | Discharge: 2024-08-14 | Disposition: A | Discharge status: Home / Self Care | Attending: Emergency Medicine | Admitting: Emergency Medicine

## 2024-08-13 ENCOUNTER — Emergency Department (HOSPITAL_COMMUNITY)

## 2024-08-13 DIAGNOSIS — R0789 Other chest pain: Secondary | ICD-10-CM | POA: Diagnosis not present

## 2024-08-13 DIAGNOSIS — Z7984 Long term (current) use of oral hypoglycemic drugs: Secondary | ICD-10-CM | POA: Diagnosis not present

## 2024-08-13 DIAGNOSIS — Z7982 Long term (current) use of aspirin: Secondary | ICD-10-CM | POA: Diagnosis not present

## 2024-08-13 DIAGNOSIS — Z79899 Other long term (current) drug therapy: Secondary | ICD-10-CM | POA: Insufficient documentation

## 2024-08-13 DIAGNOSIS — E119 Type 2 diabetes mellitus without complications: Secondary | ICD-10-CM | POA: Diagnosis not present

## 2024-08-13 DIAGNOSIS — I10 Essential (primary) hypertension: Secondary | ICD-10-CM | POA: Insufficient documentation

## 2024-08-13 DIAGNOSIS — R0602 Shortness of breath: Secondary | ICD-10-CM | POA: Diagnosis present

## 2024-08-13 LAB — CBC
HCT: 46.1 % (ref 39.0–52.0)
Hemoglobin: 14.1 g/dL (ref 13.0–17.0)
MCH: 23.6 pg — ABNORMAL LOW (ref 26.0–34.0)
MCHC: 30.6 g/dL (ref 30.0–36.0)
MCV: 77.2 fL — ABNORMAL LOW (ref 80.0–100.0)
Platelets: 301 K/uL (ref 150–400)
RBC: 5.97 MIL/uL — ABNORMAL HIGH (ref 4.22–5.81)
RDW: 17.2 % — ABNORMAL HIGH (ref 11.5–15.5)
WBC: 10.1 K/uL (ref 4.0–10.5)
nRBC: 0 % (ref 0.0–0.2)

## 2024-08-13 LAB — BASIC METABOLIC PANEL WITH GFR
Anion gap: 14 (ref 5–15)
BUN: 17 mg/dL (ref 6–20)
CO2: 22 mmol/L (ref 22–32)
Calcium: 9 mg/dL (ref 8.9–10.3)
Chloride: 102 mmol/L (ref 98–111)
Creatinine, Ser: 1.09 mg/dL (ref 0.61–1.24)
GFR, Estimated: >60 mL/min (ref >60)
Glucose, Bld: 91 mg/dL (ref 70–99)
Potassium: 4.2 mmol/L (ref 3.5–5.1)
Sodium: 138 mmol/L (ref 135–145)

## 2024-08-13 LAB — TROPONIN I (HIGH SENSITIVITY): Troponin I (High Sensitivity): 4 ng/L (ref <18)

## 2024-08-13 LAB — BRAIN NATRIURETIC PEPTIDE: B Natriuretic Peptide: 4.4 pg/mL (ref 0.0–100.0)

## 2024-08-13 NOTE — ED Triage Notes (Signed)
 Pt states that he has had intermittent SOB and midsternal chest pain x 3 weeks with worsening today.

## 2024-08-13 NOTE — ED Provider Notes (Signed)
 [place] EMERGENCY DEPARTMENT AT [facility] Provider Note   CSN: [id number] Arrival date & time: 08/13/24  1950     Patient presents with: Chest Pain and Shortness of Breath   Walter Reynolds. is a 53 y.o. male.   The history is provided by the patient.  Chest Pain Associated symptoms: shortness of breath   Shortness of Breath Associated symptoms: chest pain    He has history of hypertension, diabetes, hyperlipidemia, sleep apnea and comes in complaining of shortness of breath over the last several months.  Dyspnea is mainly at night.  He will wake up feeling like he cannot breathe.  He also has noted a nonproductive cough.  He feels like symptoms are triggered by his CPAP mask.  He also has some dyspnea during the daytime but it does not appear to be exertional.  Symptoms have generally been stable but were worse today.  He also endorses an intermittent chest tightness over the lower sternal area which is also not exertional and does not seem to correlate with his episodes of shortness of breath.  He has had a nonproductive cough which also seems to be triggered by his CPAP mask.  He denies fever, chills, sweats.  He is a non-smoker.    Prior to Admission medications   Medication Sig Start Date End Date Taking? Authorizing Provider  aspirin  EC 81 MG EC tablet Take 1 tablet (81 mg total) by mouth daily. 04/02/20   Jerri Keys, MD  atorvastatin  (LIPITOR) 40 MG tablet TAKE 1 TABLET BY MOUTH EVERY DAY 10/19/23   Purcell Emil Schanz, MD  dapagliflozin  propanediol (FARXIGA ) 10 MG TABS tablet Take 1 tablet (10 mg total) by mouth daily before breakfast. 05/26/24   Sagardia, Emil Schanz, MD  FARXIGA  10 MG TABS tablet TAKE 1 TABLET BY MOUTH DAILY BEFORE BREAKFAST. Patient not taking: Reported on 07/22/2024 10/24/23   Purcell Emil Schanz, MD  fluticasone  (FLONASE ) 50 MCG/ACT nasal spray SPRAY 2 SPRAYS INTO EACH NOSTRIL EVERY DAY Patient taking differently: Place 2 sprays into both  nostrils as needed. SPRAY 2 SPRAYS INTO EACH NOSTRIL EVERY DAY 01/11/21   Just, Kelsea J, FNP  Lancets [facility] ULTRASOFT) lancets Use as instructed 06/13/22   Elnor Lauraine BRAVO, NP  lisinopril  (ZESTRIL ) 20 MG tablet TAKE 1 TABLET BY MOUTH EVERY DAY 07/05/24   Purcell Emil Schanz, MD  metFORMIN  (GLUCOPHAGE ) 1000 MG tablet TAKE 1 TABLET (1,000 MG TOTAL) BY MOUTH TWICE A DAY WITH FOOD 09/14/23   Sagardia, [name], MD  metoprolol  succinate (TOPROL -XL) 25 MG 24 hr tablet TAKE 1 TABLET BY MOUTH EVERY DAY 01/18/24   Purcell Emil Schanz, MD  [facility] ULTRA TEST test strip USE AS INSTRUCTED 09/01/23   Purcell Emil Schanz, MD  Testosterone  Enanthate (XYOSTED ) 75 MG/0.5ML SOAJ Inject 75 mg into the skin once a week. Patient not taking: Reported on 07/22/2024 04/14/24   Sagardia, [name], MD  TRULICITY  3 MG/0.5ML [facility] INJECT 3MG  SUBCUTANEOUSLY ONCE A WEEK 07/05/24   Purcell Emil Schanz, MD    Allergies: Doxycycline , Erythromycin base, Glipizide , and Armodafinil     Review of Systems  Respiratory:  Positive for shortness of breath.   Cardiovascular:  Positive for chest pain.  All other systems reviewed and are negative.   Updated Vital Signs BP 123/81 (BP Location: Left Arm)   Pulse 83   Temp 97.9 F (36.6 C)   Resp 18   SpO2 94%   Physical Exam Vitals and nursing note reviewed.   53  year old male, resting comfortably and in no acute distress. Vital signs are normal. Oxygen saturation is 94%, which is normal. Head is normocephalic and atraumatic. PERRLA, EOMI. Oropharynx is clear. Neck is nontender and supple without adenopathy or JVD. Lungs are clear without rales, wheezes, or rhonchi.  There is a prolonged exhalation phase with faint wheezing noted with forced exhalation. Chest is nontender. Heart has regular rate and rhythm without murmur. Abdomen is soft, flat, nontender. Extremities have no cyanosis or edema, full range of motion is present. Skin is warm and dry without  rash. Neurologic: Mental status is normal, cranial nerves are intact, moves all extremities equally.  (all labs ordered are listed, but only abnormal results are displayed) Labs Reviewed  CBC - Abnormal; Notable for the following components:      Result Value   RBC 5.97 (*)    MCV 77.2 (*)    MCH 23.6 (*)    RDW 17.2 (*)    All other components within normal limits  BASIC METABOLIC PANEL WITH GFR  BRAIN NATRIURETIC PEPTIDE  TROPONIN I (HIGH SENSITIVITY)  TROPONIN I (HIGH SENSITIVITY)    EKG: EKG Interpretation Date/Time:  Friday August 13 2024 20:39:22 EDT Ventricular Rate:  92 PR Interval:  160 QRS Duration:  86 QT Interval:  362 QTC Calculation: 447 R Axis:   -4  Text Interpretation: Sinus rhythm with occasional Premature ventricular complexes Otherwise normal ECG When compared with ECG of 25-Oct-2021 08:28, Premature ventricular complexes are now present Confirmed by Raford Lenis ([id number]) on 08/13/2024 11:57:38 PM  Radiology: [facility] Chest 2 View Result Date: 08/13/2024 CLINICAL DATA:  Shortness of breath EXAM: CHEST - 2 VIEW COMPARISON:  11/26/2023 FINDINGS: The heart size and mediastinal contours are within normal limits. Both lungs are clear. The visualized skeletal structures are unremarkable. IMPRESSION: No active cardiopulmonary disease. Electronically Signed   By: Luke Bun M.D.   On: 08/13/2024 21:00     Procedures  Cardiac monitor shows normal sinus rhythm, per my interpretation. Medications Ordered in the ED  albuterol  (VENTOLIN  HFA) 108 (90 Base) MCG/ACT inhaler 2 puff (has no administration in time range)  predniSONE  (DELTASONE ) tablet 60 mg (has no administration in time range)  ipratropium-albuterol  (DUONEB) 0.5-2.5 (3) MG/3ML nebulizer solution 3 mL (3 mLs Nebulization Given 08/14/24 0024)                                    Medical Decision Making Amount and/or Complexity of Data Reviewed Labs: ordered. Radiology: ordered.  Risk Prescription drug  management.   Chest pain and dyspnea.  This is a presentation which has a wide range of treatment options and carries with it a high risk of morbidity and complications.  Differential diagnosis includes, but is not limited to, heart failure, pneumonia, bronchospasm, ACS.  Chest x-ray shows no active cardiopulmonary disease.  I have independently viewed the images, and agree with the radiologist's interpretation.  I have reviewed his electrocardiogram, my interpretation is normal ECG except for occasional PVC.  I have reviewed his laboratory tests, and my interpretation is normal basic metabolic panel, normal troponin x 2, normal CBC, normal BNP.  No evidence of ACS or heart failure.  I have reviewed his past records, and note CT coronary angiogram on 04/01/2020 which did show 24-49% stenosis of the proximal RCA and 0-24% stenosis of the LAD and normal circumflex.  Echocardiogram on 05/24/2020 showed ejection fraction 45-50% and  grade 1 diastolic dysfunction.  Cardiac MRI on 10/25/2020 showed normal ejection fraction, normal LV size, normal wall thickness.  I have ordered a therapeutic trial of albuterol  with ipratropium to see if this gives him symptomatic relief.  He states that he is feeling better following nebulizer treatment.  I have ordered a dose of prednisone  and I am discharging him on a short course of prednisone  and I am giving him an albuterol  inhaler to take home.  I am referring him to pulmonology for further outpatient workup.       Final diagnoses:  Shortness of breath  Chest discomfort    ED Discharge Orders          Ordered    predniSONE  (DELTASONE ) 20 MG tablet        08/14/24 0131    albuterol  (VENTOLIN  HFA) 108 (90 Base) MCG/ACT inhaler  Every 4 hours PRN        08/14/24 0131    Ambulatory referral to Pulmonology        08/14/24 0131               Raford Lenis, MD 08/14/24 [phone]

## 2024-08-13 NOTE — ED Provider Notes (Incomplete)
 [place] EMERGENCY DEPARTMENT AT [facility] Provider Note   CSN: [id number] Arrival date & time: 08/13/24  1950     Patient presents with: Chest Pain and Shortness of Breath   Walter Herda. is a 53 y.o. male.  {Add pertinent medical, surgical, social history, OB history to YEP:67052} The history is provided by the patient.  Chest Pain Associated symptoms: shortness of breath   Shortness of Breath Associated symptoms: chest pain    He has history of hypertension, diabetes, hyperlipidemia    Prior to Admission medications   Medication Sig Start Date End Date Taking? Authorizing Provider  aspirin  EC 81 MG EC tablet Take 1 tablet (81 mg total) by mouth daily. 04/02/20   Jerri Keys, MD  atorvastatin  (LIPITOR) 40 MG tablet TAKE 1 TABLET BY MOUTH EVERY DAY 10/19/23   Purcell Emil Schanz, MD  dapagliflozin  propanediol (FARXIGA ) 10 MG TABS tablet Take 1 tablet (10 mg total) by mouth daily before breakfast. 05/26/24   Sagardia, Emil Schanz, MD  FARXIGA  10 MG TABS tablet TAKE 1 TABLET BY MOUTH DAILY BEFORE BREAKFAST. Patient not taking: Reported on 07/22/2024 10/24/23   Purcell Emil Schanz, MD  fluticasone  (FLONASE ) 50 MCG/ACT nasal spray SPRAY 2 SPRAYS INTO EACH NOSTRIL EVERY DAY Patient taking differently: Place 2 sprays into both nostrils as needed. SPRAY 2 SPRAYS INTO EACH NOSTRIL EVERY DAY 01/11/21   Just, Kelsea J, FNP  Lancets [facility] ULTRASOFT) lancets Use as instructed 06/13/22   Elnor Lauraine BRAVO, NP  lisinopril  (ZESTRIL ) 20 MG tablet TAKE 1 TABLET BY MOUTH EVERY DAY 07/05/24   Purcell Emil Schanz, MD  metFORMIN  (GLUCOPHAGE ) 1000 MG tablet TAKE 1 TABLET (1,000 MG TOTAL) BY MOUTH TWICE A DAY WITH FOOD 09/14/23   Purcell Emil Schanz, MD  metoprolol  succinate (TOPROL -XL) 25 MG 24 hr tablet TAKE 1 TABLET BY MOUTH EVERY DAY 01/18/24   Purcell Emil Schanz, MD  [facility] ULTRA TEST test strip USE AS INSTRUCTED 09/01/23   Purcell Emil Schanz, MD  Testosterone  Enanthate  (XYOSTED ) 75 MG/0.5ML SOAJ Inject 75 mg into the skin once a week. Patient not taking: Reported on 07/22/2024 04/14/24   Purcell Emil Schanz, MD  TRULICITY  3 MG/0.5ML [facility] INJECT 3MG  SUBCUTANEOUSLY ONCE A WEEK 07/05/24   Purcell Emil Schanz, MD    Allergies: Doxycycline , Erythromycin base, Glipizide , and Armodafinil     Review of Systems  Respiratory:  Positive for shortness of breath.   Cardiovascular:  Positive for chest pain.  All other systems reviewed and are negative.   Updated Vital Signs BP 123/81 (BP Location: Left Arm)   Pulse 83   Temp 97.9 F (36.6 C)   Resp 18   SpO2 94%   Physical Exam Vitals and nursing note reviewed.   53 year old male, resting comfortably and in no acute distress. Vital signs are ***. Oxygen saturation is ***%, which is normal. Head is normocephalic and atraumatic. PERRLA, EOMI. Oropharynx is clear. Neck is nontender and supple without adenopathy or JVD. Back is nontender and there is no CVA tenderness. Lungs are clear without rales, wheezes, or rhonchi. Chest is nontender. Heart has regular rate and rhythm without murmur. Abdomen is soft, flat, nontender without masses or hepatosplenomegaly and peristalsis is normoactive. Extremities have no cyanosis or edema, full range of motion is present. Skin is warm and dry without rash. Neurologic: Mental status is normal, cranial nerves are intact, there are no motor or sensory deficits.  (all labs ordered are listed, but only abnormal results are  displayed) Labs Reviewed  CBC - Abnormal; Notable for the following components:      Result Value   RBC 5.97 (*)    MCV 77.2 (*)    MCH 23.6 (*)    RDW 17.2 (*)    All other components within normal limits  BASIC METABOLIC PANEL WITH GFR  BRAIN NATRIURETIC PEPTIDE  TROPONIN I (HIGH SENSITIVITY)  TROPONIN I (HIGH SENSITIVITY)    EKG: EKG Interpretation Date/Time:  Friday August 13 2024 20:39:22 EDT Ventricular Rate:  92 PR Interval:  160 QRS  Duration:  86 QT Interval:  362 QTC Calculation: 447 R Axis:   -4  Text Interpretation: Sinus rhythm with occasional Premature ventricular complexes Otherwise normal ECG When compared with ECG of 25-Oct-2021 08:28, Premature ventricular complexes are now present Confirmed by Raford Lenis ([id number]) on 08/13/2024 11:57:38 PM  Radiology: [facility] Chest 2 View Result Date: 08/13/2024 CLINICAL DATA:  Shortness of breath EXAM: CHEST - 2 VIEW COMPARISON:  11/26/2023 FINDINGS: The heart size and mediastinal contours are within normal limits. Both lungs are clear. The visualized skeletal structures are unremarkable. IMPRESSION: No active cardiopulmonary disease. Electronically Signed   By: Luke Bun M.D.   On: 08/13/2024 21:00    {Document cardiac monitor, telemetry assessment procedure when appropriate:32947} Procedures   Medications Ordered in the ED - No data to display    {Click here for ABCD2, HEART and other calculators REFRESH Note before signing:1}                              Medical Decision Making Amount and/or Complexity of Data Reviewed Labs: ordered. Radiology: ordered.   ***  {Document critical care time when appropriate  Document review of labs and clinical decision tools ie CHADS2VASC2, etc  Document your independent review of radiology images and any outside records  Document your discussion with family members, caretakers and with consultants  Document social determinants of health affecting pt's care  Document your decision making why or why not admission, treatments were needed:32947:::1}   Final diagnoses:  None    ED Discharge Orders     None

## 2024-08-14 ENCOUNTER — Encounter (HOSPITAL_COMMUNITY): Payer: Self-pay

## 2024-08-14 LAB — TROPONIN I (HIGH SENSITIVITY): Troponin I (High Sensitivity): 4 ng/L (ref <18)

## 2024-08-14 MED ORDER — ALBUTEROL SULFATE HFA 108 (90 BASE) MCG/ACT IN AERS
2.0000 | INHALATION_SPRAY | RESPIRATORY_TRACT | Status: DC | PRN
  Administered 2024-08-14: 2 via RESPIRATORY_TRACT
  Filled 2024-08-14: qty 6.7

## 2024-08-14 MED ORDER — ALBUTEROL SULFATE HFA 108 (90 BASE) MCG/ACT IN AERS: 2.0000 | INHALATION_SPRAY | RESPIRATORY_TRACT | Status: AC | PRN

## 2024-08-14 MED ORDER — PREDNISONE 20 MG PO TABS: ORAL_TABLET | ORAL | 0 refills | Status: DC

## 2024-08-14 MED ORDER — PREDNISONE 20 MG PO TABS
60.0000 mg | ORAL_TABLET | Freq: Once | ORAL | Status: AC
  Administered 2024-08-14: 60 mg via ORAL
  Filled 2024-08-14: qty 3

## 2024-08-14 MED ORDER — IPRATROPIUM-ALBUTEROL 0.5-2.5 (3) MG/3ML IN SOLN
3.0000 mL | Freq: Once | RESPIRATORY_TRACT | Status: AC
  Administered 2024-08-14: 3 mL via RESPIRATORY_TRACT
  Filled 2024-08-14: qty 3

## 2024-08-14 NOTE — Discharge Instructions (Addendum)
 Use the inhaler as needed for the tight feeling in your chest or trouble breathing or coughing.  Used 2 puffs from the inhaler at a time, as often as every 4 hours.  Please follow-up with the pulmonologist for further evaluation of your shortness of breath and chest discomfort.  Return to the emergency department if symptoms are getting worse.

## 2024-08-20 ENCOUNTER — Ambulatory Visit (HOSPITAL_COMMUNITY)
Admission: EM | Admit: 2024-08-20 | Discharge: 2024-08-20 | Disposition: A | Discharge status: Home / Self Care | Attending: Emergency Medicine | Admitting: Emergency Medicine

## 2024-08-20 ENCOUNTER — Encounter (HOSPITAL_COMMUNITY): Payer: Self-pay

## 2024-08-20 DIAGNOSIS — R051 Acute cough: Secondary | ICD-10-CM

## 2024-08-20 DIAGNOSIS — B9689 Other specified bacterial agents as the cause of diseases classified elsewhere: Secondary | ICD-10-CM

## 2024-08-20 DIAGNOSIS — J019 Acute sinusitis, unspecified: Secondary | ICD-10-CM | POA: Diagnosis not present

## 2024-08-20 LAB — POC COVID19/FLU A&B COMBO
Covid Antigen, POC: NEGATIVE
Influenza A Antigen, POC: NEGATIVE
Influenza B Antigen, POC: NEGATIVE

## 2024-08-20 MED ORDER — AMOXICILLIN-POT CLAVULANATE 875-125 MG PO TABS: 1.0000 | ORAL_TABLET | Freq: Two times a day (BID) | ORAL | 0 refills | Status: DC

## 2024-08-20 MED ORDER — PROMETHAZINE-DM 6.25-15 MG/5ML PO SYRP: 5.0000 mL | ORAL_SOLUTION | Freq: Four times a day (QID) | ORAL | 0 refills | Status: AC | PRN

## 2024-08-20 NOTE — ED Provider Notes (Signed)
 [facility]    CSN: [id number] Arrival date & time: 08/20/24  1718      History   Chief Complaint Chief Complaint  Patient presents with   Shortness of Breath   Nasal Congestion   Cough    HPI Walter Reynolds. is a 53 y.o. male.   Patient presents to clinic over concern of nasal congestion and sinus pressure that is causing shortness of breath.  This has been ongoing for the past 2 weeks.  Last week he did go to the emergency department for symptoms where he had normal chest x-ray was discharged with albuterol  inhaler and prednisone .  Prednisone  did seem to help, this lasted for 4 days and he has since finished this medication.  Has been having trouble using his CPAP, he is so congested it is pushing the cold air down his throat.  Reports sore throat.  Does have a cough with phlegm.  Is not getting much sleep due to issues with the CPAP.  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  Is not having any wheezing.  The history is provided by the patient and medical records.  Shortness of Breath Cough   Past Medical History:  Diagnosis Date   Allergy    Diabetes mellitus without complication (HCC)    Elevated cholesterol    Hernia    bi lateral hernia repair   History of kidney stones    Hypertension    Sleep apnea 2014   severe   Umbilical hernia without obstruction and without gangrene 09/03/2017    Patient Active Problem List   Diagnosis Date Noted   Chronic depression 06/24/2023   Chronic right shoulder pain 04/27/2021   Erectile dysfunction 04/26/2021   Body mass index (BMI) of 40.1-44.9 in adult [place]) 07/17/2020   S/P arthroscopy of left shoulder 02/01/2020   Impingement syndrome of left shoulder 01/14/2020   Hypertension associated with diabetes (HCC) 09/27/2019   Morbid obesity (HCC) 09/27/2019   Dyslipidemia 09/09/2018   Environmental allergies 09/09/2018   BMI 39.0-39.9,adult 09/03/2017   Dyslipidemia associated with type 2 diabetes mellitus (HCC)  09/03/2017   Anemia 04/14/2017   Hyperlipidemia 05/02/2016   OSA (obstructive sleep apnea) 09/06/2015   Benign essential HTN 09/06/2015    Past Surgical History:  Procedure Laterality Date   CARPAL TUNNEL RELEASE Right 2007   CARPAL TUNNEL RELEASE Left 10/28/2014   Procedure: LEFT CARPAL TUNNEL RELEASE;  Surgeon: Donnice Robinsons, MD;  Location: [facility];  Service: Orthopedics;  Laterality: Left;   FOOT SURGERY Left    INGUINAL HERNIA REPAIR     times two, each side   KIDNEY STONE SURGERY     MASS EXCISION Left 10/28/2014   Procedure: LEFT WRIST VOLAR MASS EXCISION;  Surgeon: Donnice Robinsons, MD;  Location: [facility];  Service: Orthopedics;  Laterality: Left;   UMBILICAL HERNIA REPAIR N/A 10/27/2017   Procedure: HERNIA REPAIR UMBILICAL ADULT;  Surgeon: Signe Mitzie LABOR, MD;  Location: [facility];  Service: General;  Laterality: N/A;       Home Medications    Prior to Admission medications   Medication Sig Start Date End Date Taking? Authorizing Provider  albuterol  (VENTOLIN  HFA) 108 (90 Base) MCG/ACT inhaler Inhale 2 puffs into the lungs every 4 (four) hours as needed for wheezing or shortness of breath. 08/14/24  Yes Raford Lenis, MD  amoxicillin -clavulanate (AUGMENTIN ) 875-125 MG tablet Take 1 tablet by mouth every 12 (twelve) hours. 08/20/24  Yes Dreama, Tristan Proto  N, FNP  aspirin   EC 81 MG EC tablet Take 1 tablet (81 mg total) by mouth daily. 04/02/20  Yes Jerri Keys, MD  atorvastatin  (LIPITOR) 40 MG tablet TAKE 1 TABLET BY MOUTH EVERY DAY 10/19/23  Yes Sagardia, [name], MD  dapagliflozin  propanediol (FARXIGA ) 10 MG TABS tablet Take 1 tablet (10 mg total) by mouth daily before breakfast. 05/26/24  Yes Sagardia, Emil Schanz, MD  FARXIGA  10 MG TABS tablet TAKE 1 TABLET BY MOUTH DAILY BEFORE BREAKFAST. 10/24/23  Yes Sagardia, Emil Schanz, MD  fluticasone  (FLONASE ) 50 MCG/ACT nasal spray SPRAY 2 SPRAYS INTO EACH NOSTRIL EVERY DAY Patient taking differently:  Place 2 sprays into both nostrils as needed. SPRAY 2 SPRAYS INTO EACH NOSTRIL EVERY DAY 01/11/21  Yes Just, Kelsea J, FNP  Lancets (ONETOUCH ULTRASOFT) lancets Use as instructed 06/13/22  Yes Elnor Lauraine BRAVO, NP  lisinopril  (ZESTRIL ) 20 MG tablet TAKE 1 TABLET BY MOUTH EVERY DAY 07/05/24  Yes Sagardia, Emil Schanz, MD  metFORMIN  (GLUCOPHAGE ) 1000 MG tablet TAKE 1 TABLET (1,000 MG TOTAL) BY MOUTH TWICE A DAY WITH FOOD 09/14/23  Yes Sagardia, [name], MD  metoprolol  succinate (TOPROL -XL) 25 MG 24 hr tablet TAKE 1 TABLET BY MOUTH EVERY DAY 01/18/24  Yes Sagardia, [name], MD  [facility] ULTRA TEST test strip USE AS INSTRUCTED 09/01/23  Yes Purcell Emil Schanz, MD  predniSONE  (DELTASONE ) 20 MG tablet 2 tabs po daily x 4 days 08/14/24  Yes Raford Lenis, MD  promethazine -dextromethorphan (PROMETHAZINE -DM) 6.25-15 MG/5ML syrup Take 5 mLs by mouth 4 (four) times daily as needed for cough. 08/20/24  Yes Zamira Hickam  N, FNP  Testosterone  Enanthate (XYOSTED ) 75 MG/0.5ML SOAJ Inject 75 mg into the skin once a week. 04/14/24  Yes Sagardia, Emil Schanz, MD  TRULICITY  3 MG/0.5ML SOAJ INJECT 3MG  SUBCUTANEOUSLY ONCE A WEEK 07/05/24  Yes Sagardia, Emil Schanz, MD    Family History Family History  Problem Relation Age of Onset   Diabetes Mother    Stroke Mother        4 strokes   Heart attack Mother    Heart disease Mother    Hypertension Father    Hypertension Sister    Heart disease Sister        pacemaker   Colon cancer Neg Hx    Rectal cancer Neg Hx    Stomach cancer Neg Hx     Social History Social History   Tobacco Use   Smoking status: Never   Smokeless tobacco: Never  Vaping Use   Vaping status: Never Used  Substance Use Topics   Alcohol use: No    Alcohol/week: 0.0 standard drinks of alcohol   Drug use: No     Allergies   Doxycycline , Erythromycin base, Glipizide , and Armodafinil    Review of Systems Review of Systems  Per HPI  Physical Exam Triage Vital Signs ED Triage  Vitals [08/20/24 1740]  Encounter Vitals Group     BP 131/84     Girls Systolic BP Percentile      Girls Diastolic BP Percentile      Boys Systolic BP Percentile      Boys Diastolic BP Percentile      Pulse Rate (!) 120     Resp 18     Temp 99.4 F (37.4 C)     Temp Source Oral     SpO2 96 %     Weight      Height      Head Circumference      Peak Flow  Pain Score      Pain Loc      Pain Education      Exclude from Growth Chart    No data found.  Updated Vital Signs BP 131/84 (BP Location: Left Arm)   Pulse (!) 120   Temp 99.4 F (37.4 C) (Oral)   Resp 18   SpO2 96%   Visual Acuity Right Eye Distance:   Left Eye Distance:   Bilateral Distance:    Right Eye Near:   Left Eye Near:    Bilateral Near:     Physical Exam Vitals and nursing note reviewed.  Constitutional:      Appearance: Normal appearance.  HENT:     Head: Normocephalic and atraumatic.     Right Ear: External ear normal.     Left Ear: External ear normal.     Nose: Congestion and rhinorrhea present.     Mouth/Throat:     Mouth: Mucous membranes are moist.     Pharynx: Posterior oropharyngeal erythema present.  Eyes:     Conjunctiva/sclera: Conjunctivae normal.  Cardiovascular:     Rate and Rhythm: Regular rhythm. Tachycardia present.     Heart sounds: Normal heart sounds. No murmur heard. Pulmonary:     Effort: Pulmonary effort is normal. No respiratory distress.     Breath sounds: Normal breath sounds. No wheezing.  Skin:    General: Skin is warm and dry.  Neurological:     General: No focal deficit present.     Mental Status: He is alert and oriented to person, place, and time.  Psychiatric:        Mood and Affect: Mood normal.        Behavior: Behavior normal.      UC Treatments / Results  Labs (all labs ordered are listed, but only abnormal results are displayed) Labs Reviewed  POC COVID19/FLU A&B COMBO    EKG   Radiology No results found.  Procedures Procedures  (including critical care time)  Medications Ordered in UC Medications - No data to display  Initial Impression / Assessment and Plan / UC Course  I have reviewed the triage vital signs and the nursing notes.  Pertinent labs & imaging results that were available during my care of the patient were reviewed by me and considered in my medical decision making (see chart for details).  Vitals and triage reviewed, patient is hemodynamically stable.  Is tachycardic.  Does have congestion, rhinorrhea postnasal drip.  Lungs vesicular.   Normal chest x-ray last week, symptoms have persisted with worsening sinus congestion.  Concerning for acute bacterial sinusitis, will treat with Augmentin .  Cough management as needed.  Encourage pulmonology follow-up.  Plan of care, follow-up care and strict emergency precautions given if symptoms evolve.  No questions at this time.     Final Clinical Impressions(s) / UC Diagnoses   Final diagnoses:  Acute cough  Acute bacterial sinusitis     Discharge Instructions      Take the antibiotic twice daily with food to treat your bacterial sinus infection.  You can use the cough medicine up to 4 times daily as needed, this medication may cause sedation so do not drink alcohol or drive on it.  Using over-the-counter sinus rinses such as Nettie pot may help manually remove congestion, ensure you are using filtered water.   Symptoms should improve over the weekend.  If no improvement by Monday or any changes return to clinic for reevaluation.  Please follow-up with pulmonology  as recommended by emergency department.  Seek immediate care for any new concerning symptoms.    ED Prescriptions     Medication Sig Dispense Auth. Provider   amoxicillin -clavulanate (AUGMENTIN ) 875-125 MG tablet Take 1 tablet by mouth every 12 (twelve) hours. 14 tablet Dreama, Braxtyn Bojarski  N, FNP   promethazine -dextromethorphan (PROMETHAZINE -DM) 6.25-15 MG/5ML syrup Take 5 mLs by mouth 4  (four) times daily as needed for cough. 118 mL Dreama, Pamelia Botto  N, FNP      PDMP not reviewed this encounter.   Dreama Robie SAILOR, FNP 08/20/24 1830

## 2024-08-20 NOTE — Discharge Instructions (Addendum)
 Take the antibiotic twice daily with food to treat your bacterial sinus infection.  You can use the cough medicine up to 4 times daily as needed, this medication may cause sedation so do not drink alcohol or drive on it.  Using over-the-counter sinus rinses such as Nettie pot may help manually remove congestion, ensure you are using filtered water.   Symptoms should improve over the weekend.  If no improvement by Monday or any changes return to clinic for reevaluation.  Please follow-up with pulmonology as recommended by emergency department.  Seek immediate care for any new concerning symptoms.

## 2024-08-20 NOTE — ED Triage Notes (Signed)
 Patient states SOB and nasal congestion started x 4 days ago. Patient states the nasal congestion is worse at night. Patient was seen at the ED and prescribed Prednisone . No relief.

## 2024-08-23 ENCOUNTER — Ambulatory Visit: Payer: Self-pay

## 2024-08-23 NOTE — Telephone Encounter (Signed)
 Called and spoke with the pt. Pt states he went to ED 9/5 and was told her had upper respiratory infection and took 4 day round of antibiotics. Pt has heavy SOB in chest w/ ongoing coughing during day and night. Pt uses cpap, unable to use as it causes congested nose and worsening coughing. Pt takes cpap off at night due to this and uses nasal spray then tries to keep wearing cpap but it is challenging. Pt has been scheduled acute OV 9/9 with Candis Dandy.  Nfn

## 2024-08-23 NOTE — Telephone Encounter (Signed)
 FYI Only or Action Required?: Action required by provider: request for appointment.  Patient is followed in Pulmonology for OSA, last seen on 01/13/2024 by Neda Jennet LABOR, MD.  Called Nurse Triage reporting Shortness of Breath.  Symptoms began several weeks ago.  Interventions attempted: Prescription medications: Augmentin  and Rescue inhaler.  Symptoms are: unchanged.  Triage Disposition: See HCP Within 4 Hours (Or PCP Triage)-hard stop on decision tree-unable to schedule. Needs a follow up call.   Patient/caregiver understands and will follow disposition?: No, wishes to speak with PCP  Copied from CRM [id number]. Topic: Clinical - Red Word Triage >> Aug 23, 2024  8:18 AM Walter Reynolds wrote: [facility] that prompted transfer to Nurse Triage: shortness of breath . Need to be seen . Stop using cpap cause he couldn't breath He has a referral on file look like he last saw Dr Neda 01/13/2024 Reason for Disposition  [1] MILD difficulty breathing (e.g., minimal/no SOB at rest, SOB with walking, pulse < 100) AND [2] NEW-onset or WORSE than normal  Answer Assessment - Initial Assessment Questions 1. RESPIRATORY STATUS: Describe your breathing? (e.g., wheezing, shortness of breath, unable to speak, severe coughing)      Shortness of breath, cough at night 2. ONSET: When did this breathing problem begin?      Been going on for several weeks.  3. PATTERN Does the difficult breathing come and go, or has it been constant since it started?      Constant during the night.  4. SEVERITY: How bad is your breathing? (e.g., mild, moderate, severe)      Patient reports having shortness of breath at night-endorses with nasal congestion that he is having a hard time with his CPAP machine 5. RECURRENT SYMPTOM: Have you had difficulty breathing before? If Yes, ask: When was the last time? and What happened that time?      yes 6. CARDIAC HISTORY: Do you have any history of heart disease? (e.g.,  heart attack, angina, bypass surgery, angioplasty)      no 7. LUNG HISTORY: Do you have any history of lung disease?  (e.g., pulmonary embolus, asthma, emphysema)     OSA 8. CAUSE: What do you think is causing the breathing problem?      Nasal congestion 9. OTHER SYMPTOMS: Do you have any other symptoms? (e.g., chest pain, cough, dizziness, fever, runny nose)     cough 10. O2 SATURATION MONITOR:  Do you use an oxygen saturation monitor (pulse oximeter) at home? If Yes, ask: What is your reading (oxygen level) today? What is your usual oxygen saturation reading? (e.g., 95%)       NA 12. TRAVEL: Have you traveled out of the country in the last month? (e.g., travel history, exposures)       no  Patient reports he is having difficulty sleeping with CPAP due to congestion. Was seen on Sept 5 at [facility] and was diagnosed with bacterial sinusitis. Patient states he wakes up short of breath due to nasal congestion. Reports he has been using his medication but hasn't had any improvement. Unable to schedule-hard stop on the decision tree. Sending to office for follow up. Patient is needing a follow up phone call.  Protocols used: Breathing Difficulty-A-AH

## 2024-08-24 ENCOUNTER — Ambulatory Visit (HOSPITAL_BASED_OUTPATIENT_CLINIC_OR_DEPARTMENT_OTHER)

## 2024-08-24 ENCOUNTER — Other Ambulatory Visit (HOSPITAL_BASED_OUTPATIENT_CLINIC_OR_DEPARTMENT_OTHER): Payer: Self-pay

## 2024-08-24 ENCOUNTER — Encounter (HOSPITAL_BASED_OUTPATIENT_CLINIC_OR_DEPARTMENT_OTHER): Payer: Self-pay

## 2024-08-24 VITALS — BP 110/76 | HR 82 | Temp 97.7°F | Ht 67.0 in | Wt 245.0 lb

## 2024-08-24 DIAGNOSIS — R0602 Shortness of breath: Secondary | ICD-10-CM | POA: Diagnosis not present

## 2024-08-24 DIAGNOSIS — G4733 Obstructive sleep apnea (adult) (pediatric): Secondary | ICD-10-CM

## 2024-08-24 DIAGNOSIS — J40 Bronchitis, not specified as acute or chronic: Secondary | ICD-10-CM | POA: Diagnosis not present

## 2024-08-24 DIAGNOSIS — J329 Chronic sinusitis, unspecified: Secondary | ICD-10-CM | POA: Diagnosis not present

## 2024-08-24 MED ORDER — BENZONATATE 200 MG PO CAPS
200.0000 mg | ORAL_CAPSULE | Freq: Three times a day (TID) | ORAL | 1 refills | Status: AC | PRN
  Filled 2024-08-24: qty 30, 10d supply, fill #0

## 2024-08-24 MED ORDER — DM-GUAIFENESIN ER 30-600 MG PO TB12
1.0000 | ORAL_TABLET | Freq: Two times a day (BID) | ORAL | 1 refills | Status: AC
  Filled 2024-08-24: qty 20, 10d supply, fill #0

## 2024-08-24 NOTE — Assessment & Plan Note (Signed)
-    Encouraged to keep equipment clean and dry as patient has already been doing -  Resume CPAP use once improving nasal congestion; unable to tolerate presently with acute illness. -  Review compliance download at next appointment

## 2024-08-24 NOTE — Progress Notes (Signed)
 @Patient  ID: Walter Manya Raddle., male    DOB: 05/11/1971, 53 y.o.   MRN: [id number]  Chief Complaint  Patient presents with   Shortness of Breath    Acute Visit     Referring provider: Purcell Emil Schanz, *  HPI: Walter Reynolds is a 53 y/o male with PMH of OSA who presents today as an acute visit for c/o cough.  He reports that he has had issues with nasal congestion, postnasal drip, and nonproductive cough for about 2 weeks.  He was seen in the ER on 08/13/2024 for shortness of breath and treated with steroids and Albuterol .  He reports minimal relief with that.  He was then seen at Urgent care and diagnosed with bacterial sinusitis and started on oral Augmentin .  He is still taking this medication presently and has ~4 days left of treatment.  Today he reports that he has continued to have sinus congestion and cough.  He denies fever, chills, chest pain, headache, night sweats, weight changes, wheezing, or other c/o.  He has not tried OTC medications such as a decongestant for his symptoms.  Aside from his complaints today, he reports that he has some DOE at baseline, and feels that there is something wrong with his lungs.  He requests further evaluation.  TEST/EVENTS : Chest 2 view 08/13/2024:  no acute cardiopulmonary abnormality.   Chest CT 10/24/2022:  stable 4mm LLL nodule (stable x 3+ years); images reviewed today  Allergies  Allergen Reactions   Doxycycline  Hives   Erythromycin Base Hives   Glipizide  Shortness Of Breath    Shakey    Armodafinil  Palpitations    In high doses per patient.     Immunization History  Administered Date(s) Administered   Influenza, Seasonal, Injecte, Preservative Fre 11/10/2023   Influenza,inj,Quad PF,6+ Mos 08/12/2017, 09/09/2018, 09/27/2019, 10/30/2020, 11/05/2022   Influenza-Unspecified 10/15/2021   PFIZER(Purple Top)SARS-COV-2 Vaccination 03/09/2020, 03/31/2020, 11/26/2020, 10/15/2021   PNEUMOCOCCAL CONJUGATE-20 11/05/2021   Pfizer  Covid-19 Vaccine Bivalent Booster 40yrs & up 10/15/2021   Pneumococcal Polysaccharide-23 08/22/2017   Tdap 07/02/2014, 07/22/2024   Zoster Recombinant(Shingrix ) 03/04/2022, 11/05/2022    Past Medical History:  Diagnosis Date   Allergy    Diabetes mellitus without complication (HCC)    Elevated cholesterol    Hernia    bi lateral hernia repair   History of kidney stones    Hypertension    Sleep apnea 2014   severe   Umbilical hernia without obstruction and without gangrene 09/03/2017    Tobacco History: Social History   Tobacco Use  Smoking Status Never  Smokeless Tobacco Never   Counseling given: Not Answered   Outpatient Medications Prior to Visit  Medication Sig Dispense Refill   albuterol  (VENTOLIN  HFA) 108 (90 Base) MCG/ACT inhaler Inhale 2 puffs into the lungs every 4 (four) hours as needed for wheezing or shortness of breath.     amoxicillin -clavulanate (AUGMENTIN ) 875-125 MG tablet Take 1 tablet by mouth every 12 (twelve) hours. 14 tablet 0   aspirin  EC 81 MG EC tablet Take 1 tablet (81 mg total) by mouth daily. 30 tablet 0   atorvastatin  (LIPITOR) 40 MG tablet TAKE 1 TABLET BY MOUTH EVERY DAY 90 tablet 3   dapagliflozin  propanediol (FARXIGA ) 10 MG TABS tablet Take 1 tablet (10 mg total) by mouth daily before breakfast. 90 tablet 3   FARXIGA  10 MG TABS tablet TAKE 1 TABLET BY MOUTH DAILY BEFORE BREAKFAST. 90 tablet 3   fluticasone  (FLONASE ) 50 MCG/ACT nasal spray SPRAY  2 SPRAYS INTO EACH NOSTRIL EVERY DAY (Patient taking differently: Place 2 sprays into both nostrils as needed. SPRAY 2 SPRAYS INTO EACH NOSTRIL EVERY DAY) 48 mL 12   Lancets (ONETOUCH ULTRASOFT) lancets Use as instructed 100 each 12   lisinopril  (ZESTRIL ) 20 MG tablet TAKE 1 TABLET BY MOUTH EVERY DAY 90 tablet 1   metFORMIN  (GLUCOPHAGE ) 1000 MG tablet TAKE 1 TABLET (1,000 MG TOTAL) BY MOUTH TWICE A DAY WITH FOOD 180 tablet 1   metoprolol  succinate (TOPROL -XL) 25 MG 24 hr tablet TAKE 1 TABLET BY MOUTH  EVERY DAY 90 tablet 3   ONETOUCH ULTRA TEST test strip USE AS INSTRUCTED 100 strip 12   predniSONE  (DELTASONE ) 20 MG tablet 2 tabs po daily x 4 days 8 tablet 0   promethazine -dextromethorphan  (PROMETHAZINE -DM) 6.25-15 MG/5ML syrup Take 5 mLs by mouth 4 (four) times daily as needed for cough. 118 mL 0   Testosterone  Enanthate (XYOSTED ) 75 MG/0.5ML SOAJ Inject 75 mg into the skin once a week. 2 mL 5   TRULICITY  3 MG/0.5ML SOAJ INJECT 3MG  SUBCUTANEOUSLY ONCE A WEEK 12 mL 0   No facility-administered medications prior to visit.     Review of Systems: as per HPI  Constitutional:   No  weight loss, night sweats,  Fevers, chills, fatigue, or  lassitude.  HEENT:   No headaches,  Difficulty swallowing,  Tooth/dental problems, or  Sore throat,                No sneezing, itching, ear ache, nasal congestion, post nasal drip,   CV:  No chest pain,  Orthopnea, PND, swelling in lower extremities, anasarca, dizziness, palpitations, syncope.   GI  No heartburn, indigestion, abdominal pain, nausea, vomiting, diarrhea, change in bowel habits, loss of appetite, bloody stools.   Resp: No shortness of breath with exertion or at rest.  No excess mucus, no productive cough,  No non-productive cough,  No coughing up of blood.  No change in color of mucus.  No wheezing.  No chest wall deformity  Skin: no rash or lesions.  GU: no dysuria, change in color of urine, no urgency or frequency.  No flank pain, no hematuria   MS:  No joint pain or swelling.  No decreased range of motion.  No back pain.    Physical Exam  BP 110/76   Pulse 82   Temp 97.7 F (36.5 C) (Oral)   Ht 5' 7 (1.702 m)   Wt 245 lb (111.1 kg)   SpO2 94%   BMI 38.37 kg/m   GEN: A/Ox3; pleasant , NAD, well nourished.  Speaks in full sentences; no distress   HEENT:  [place]/AT,  EACs-clear, TMs-wnl, NOSE-mucosa erythematous THROAT-mucosa erythematous, no lesions, no postnasal drip or exudate noted.   NECK:  Supple w/ fair ROM; no JVD;  normal carotid impulses w/o bruits; no thyromegaly or nodules palpated; no lymphadenopathy.    RESP  Clear  P & A; w/o, wheezes/ rales/ or rhonchi. no accessory muscle use, no dullness to percussion  CARD:  RRR, no m/r/g, no peripheral edema, pulses intact, no cyanosis or clubbing.  GI:   Soft & nt; nml bowel sounds; no organomegaly or masses detected.   Musco: Warm bil, no deformities or joint swelling noted.   Neuro: alert, no focal deficits noted.    Skin: Warm, no lesions or rashes    Lab Results:  CBC    Component Value Date/Time   WBC 10.1 08/13/2024 2052   RBC 5.97 (H)  08/13/2024 2052   HGB 14.1 08/13/2024 2052   HGB 13.2 04/13/2020 1532   HCT 46.1 08/13/2024 2052   HCT 41.9 04/13/2020 1532   PLT 301 08/13/2024 2052   PLT 292 04/13/2020 1532   MCV 77.2 (L) 08/13/2024 2052   MCV 75 (L) 04/13/2020 1532   MCH 23.6 (L) 08/13/2024 2052   MCHC 30.6 08/13/2024 2052   RDW 17.2 (H) 08/13/2024 2052   RDW 15.3 04/13/2020 1532   LYMPHSABS 2.5 05/26/2024 0905   LYMPHSABS 3.7 (H) 04/13/2020 1532   MONOABS 0.7 05/26/2024 0905   EOSABS 0.2 05/26/2024 0905   EOSABS 0.3 04/13/2020 1532   BASOSABS 0.0 05/26/2024 0905   BASOSABS 0.1 04/13/2020 1532    BMET    Component Value Date/Time   NA 138 08/13/2024 2052   NA 140 02/20/2021 0838   K 4.2 08/13/2024 2052   CL 102 08/13/2024 2052   CO2 22 08/13/2024 2052   GLUCOSE 91 08/13/2024 2052   BUN 17 08/13/2024 2052   BUN 14 02/20/2021 0838   CREATININE 1.09 08/13/2024 2052   CREATININE 0.96 05/02/2016 0855   CALCIUM  9.0 08/13/2024 2052   GFRNONAA >60 08/13/2024 2052   GFRNONAA 68 10/23/2015 1933   GFRAA 100 04/13/2020 1532   GFRAA 79 10/23/2015 1933    BNP    Component Value Date/Time   BNP 4.4 08/13/2024 2052    ProBNP No results found for: PROBNP  Imaging: DG Chest 2 View Result Date: 08/13/2024 CLINICAL DATA:  Shortness of breath EXAM: CHEST - 2 VIEW COMPARISON:  11/26/2023 FINDINGS: The heart size and  mediastinal contours are within normal limits. Both lungs are clear. The visualized skeletal structures are unremarkable. IMPRESSION: No active cardiopulmonary disease. Electronically Signed   By: Luke Bun M.D.   On: 08/13/2024 21:00    Administration History     None           No data to display          No results found for: NITRICOXIDE   Assessment & Plan:   Assessment & Plan Bronchitis -  Likely viral in nature -  Symptomatic treatment with antitussives, Mucinex , Tessalon  Perles -  Increase fluid intake, rest -  Return to clinic if fever, chills, sputum production, new or worsening symptoms Shortness of breath -  Suspect this is multifactorial in the setting of OSA and obesity, but PFTs would help sort this out once he is well -  Plan for PFT and follow up in 3 months Sinusitis, unspecified chronicity, unspecified location -  Continue Augmentin  and finish course -  Symptomatic treatment OSA (obstructive sleep apnea) -  Encouraged to keep equipment clean and dry as patient has already been doing -  Resume CPAP use once improving nasal congestion; unable to tolerate presently with acute illness. -  Review compliance download at next appointment   Return in about 3 months (around 11/23/2024) for PFT.  Candis Dandy, PA-C 08/24/2024

## 2024-08-24 NOTE — Patient Instructions (Signed)
 Start Mucinex  DM; take twice daily for cough  Use Tessalon  Perles as needed (per bottle instructions) for cough  Increase fluid intake and rest.  Return to clinic if fevers, cough becomes productive, or new symptoms.  Complete PFTs and return to office for follow up in 3 months.

## 2024-09-03 ENCOUNTER — Other Ambulatory Visit (HOSPITAL_BASED_OUTPATIENT_CLINIC_OR_DEPARTMENT_OTHER): Payer: Self-pay

## 2024-11-25 ENCOUNTER — Encounter: Payer: Self-pay | Admitting: Emergency Medicine

## 2024-11-25 ENCOUNTER — Other Ambulatory Visit: Payer: Self-pay | Admitting: Emergency Medicine

## 2024-11-25 ENCOUNTER — Ambulatory Visit: Admitting: Emergency Medicine

## 2024-11-25 VITALS — BP 126/82 | HR 86 | Temp 98.0°F | Ht 67.0 in | Wt 252.0 lb

## 2024-11-25 DIAGNOSIS — E1169 Type 2 diabetes mellitus with other specified complication: Secondary | ICD-10-CM

## 2024-11-25 DIAGNOSIS — G4733 Obstructive sleep apnea (adult) (pediatric): Secondary | ICD-10-CM | POA: Diagnosis not present

## 2024-11-25 DIAGNOSIS — Z7984 Long term (current) use of oral hypoglycemic drugs: Secondary | ICD-10-CM | POA: Diagnosis not present

## 2024-11-25 DIAGNOSIS — Z6839 Body mass index (BMI) 39.0-39.9, adult: Secondary | ICD-10-CM

## 2024-11-25 DIAGNOSIS — E1159 Type 2 diabetes mellitus with other circulatory complications: Secondary | ICD-10-CM

## 2024-11-25 DIAGNOSIS — E785 Hyperlipidemia, unspecified: Secondary | ICD-10-CM

## 2024-11-25 DIAGNOSIS — I152 Hypertension secondary to endocrine disorders: Secondary | ICD-10-CM

## 2024-11-25 LAB — POCT GLYCOSYLATED HEMOGLOBIN (HGB A1C): HbA1c POC (<> result, manual entry): 6.5 % (ref 4.0–5.6)

## 2024-11-25 NOTE — Patient Instructions (Signed)
 Health Maintenance, Male  Adopting a healthy lifestyle and getting preventive care are important in promoting health and wellness. Ask your health care provider about:  The right schedule for you to have regular tests and exams.  Things you can do on your own to prevent diseases and keep yourself healthy.  What should I know about diet, weight, and exercise?  Eat a healthy diet    Eat a diet that includes plenty of vegetables, fruits, low-fat dairy products, and lean protein.  Do not eat a lot of foods that are high in solid fats, added sugars, or sodium.  Maintain a healthy weight  Body mass index (BMI) is a measurement that can be used to identify possible weight problems. It estimates body fat based on height and weight. Your health care provider can help determine your BMI and help you achieve or maintain a healthy weight.  Get regular exercise  Get regular exercise. This is one of the most important things you can do for your health. Most adults should:  Exercise for at least 150 minutes each week. The exercise should increase your heart rate and make you sweat (moderate-intensity exercise).  Do strengthening exercises at least twice a week. This is in addition to the moderate-intensity exercise.  Spend less time sitting. Even light physical activity can be beneficial.  Watch cholesterol and blood lipids  Have your blood tested for lipids and cholesterol at 54 years of age, then have this test every 5 years.  You may need to have your cholesterol levels checked more often if:  Your lipid or cholesterol levels are high.  You are older than 54 years of age.  You are at high risk for heart disease.  What should I know about cancer screening?  Many types of cancers can be detected early and may often be prevented. Depending on your health history and family history, you may need to have cancer screening at various ages. This may include screening for:  Colorectal cancer.  Prostate cancer.  Skin cancer.  Lung  cancer.  What should I know about heart disease, diabetes, and high blood pressure?  Blood pressure and heart disease  High blood pressure causes heart disease and increases the risk of stroke. This is more likely to develop in people who have high blood pressure readings or are overweight.  Talk with your health care provider about your target blood pressure readings.  Have your blood pressure checked:  Every 3-5 years if you are 24-52 years of age.  Every year if you are 3 years old or older.  If you are between the ages of 60 and 72 and are a current or former smoker, ask your health care provider if you should have a one-time screening for abdominal aortic aneurysm (AAA).  Diabetes  Have regular diabetes screenings. This checks your fasting blood sugar level. Have the screening done:  Once every three years after age 66 if you are at a normal weight and have a low risk for diabetes.  More often and at a younger age if you are overweight or have a high risk for diabetes.  What should I know about preventing infection?  Hepatitis B  If you have a higher risk for hepatitis B, you should be screened for this virus. Talk with your health care provider to find out if you are at risk for hepatitis B infection.  Hepatitis C  Blood testing is recommended for:  Everyone born from [birth year] through 1966.  Anyone  with known risk factors for hepatitis C.  Sexually transmitted infections (STIs)  You should be screened each year for STIs, including gonorrhea and chlamydia, if:  You are sexually active and are younger than 53 years of age.  You are older than 54 years of age and your health care provider tells you that you are at risk for this type of infection.  Your sexual activity has changed since you were last screened, and you are at increased risk for chlamydia or gonorrhea. Ask your health care provider if you are at risk.  Ask your health care provider about whether you are at high risk for HIV. Your health care provider  may recommend a prescription medicine to help prevent HIV infection. If you choose to take medicine to prevent HIV, you should first get tested for HIV. You should then be tested every 3 months for as long as you are taking the medicine.  Follow these instructions at home:  Alcohol use  Do not drink alcohol if your health care provider tells you not to drink.  If you drink alcohol:  Limit how much you have to 0-2 drinks a day.  Know how much alcohol is in your drink. In the [place] one drink equals one 12 oz bottle of beer (355 mL), one 5 oz glass of wine (148 mL), or one 1 oz glass of hard liquor (44 mL).  Lifestyle  Do not use any products that contain nicotine or tobacco. These products include cigarettes, chewing tobacco, and vaping devices, such as e-cigarettes. If you need help quitting, ask your health care provider.  Do not use street drugs.  Do not share needles.  Ask your health care provider for help if you need support or information about quitting drugs.  General instructions  Schedule regular health, dental, and eye exams.  Stay current with your vaccines.  Tell your health care provider if:  You often feel depressed.  You have ever been abused or do not feel safe at home.  Summary  Adopting a healthy lifestyle and getting preventive care are important in promoting health and wellness.  Follow your health care provider's instructions about healthy diet, exercising, and getting tested or screened for diseases.  Follow your health care provider's instructions on monitoring your cholesterol and blood pressure.  This information is not intended to replace advice given to you by your health care provider. Make sure you discuss any questions you have with your health care provider.  Document Revised: 04/23/2021 Document Reviewed: 04/23/2021  Elsevier Patient Education  2024 [employer].

## 2024-11-25 NOTE — Assessment & Plan Note (Signed)
 Still struggling with CPAP Last pulmonary evaluation as follows:   Encouraged to keep equipment clean and dry as patient has already been doing -  Resume CPAP use once improving nasal congestion; unable to tolerate presently with acute illness. -  Review compliance download at next appointment

## 2024-11-25 NOTE — Progress Notes (Signed)
 Walter Reynolds. 53 y.o.   Chief Complaint  Patient presents with   Follow-up    HISTORY OF PRESENT ILLNESS: This is a 53 y.o. male here for follow-up of chronic medical conditions including diabetes and hypertension. Overall doing well.  Has no complaints or medical concerns today. Wt Readings from Last 3 Encounters:  11/25/24 252 lb (114.3 kg)  08/24/24 245 lb (111.1 kg)  08/14/24 253 lb 8.5 oz (115 kg)   Lab Results  Component Value Date   HGBA1C 7.5 (H) 05/26/2024   BP Readings from Last 3 Encounters:  11/25/24 126/82  08/24/24 110/76  08/20/24 131/84     HPI   Prior to Admission medications  Medication Sig Start Date End Date Taking? Authorizing Provider  albuterol  (VENTOLIN  HFA) 108 (90 Base) MCG/ACT inhaler Inhale 2 puffs into the lungs every 4 (four) hours as needed for wheezing or shortness of breath. 08/14/24  Yes Raford Lenis, MD  aspirin  EC 81 MG EC tablet Take 1 tablet (81 mg total) by mouth daily. 04/02/20  Yes Jerri Keys, MD  atorvastatin  (LIPITOR) 40 MG tablet TAKE 1 TABLET BY MOUTH EVERY DAY 10/19/23  Yes Fonda Rochon, Emil Schanz, MD  benzonatate  (TESSALON ) 200 MG capsule Take 1 capsule (200 mg total) by mouth 3 (three) times daily as needed for cough. 08/24/24  Yes Charley Conger, PA-C  dapagliflozin  propanediol (FARXIGA ) 10 MG TABS tablet Take 1 tablet (10 mg total) by mouth daily before breakfast. 05/26/24  Yes Kjerstin Abrigo Jose, MD  dextromethorphan -guaiFENesin  (MUCINEX  DM) 30-600 MG 12hr tablet Take 1 tablet by mouth 2 (two) times daily. 08/24/24  Yes Charley Conger, PA-C  FARXIGA  10 MG TABS tablet TAKE 1 TABLET BY MOUTH DAILY BEFORE BREAKFAST. 10/24/23  Yes Britlyn Martine, Emil Schanz, MD  fluticasone  (FLONASE ) 50 MCG/ACT nasal spray SPRAY 2 SPRAYS INTO EACH NOSTRIL EVERY DAY Patient taking differently: Place 2 sprays into both nostrils as needed. SPRAY 2 SPRAYS INTO EACH NOSTRIL EVERY DAY 01/11/21  Yes Just, Kelsea J, FNP  Lancets (ONETOUCH ULTRASOFT) lancets Use as  instructed 06/13/22  Yes Elnor Lauraine BRAVO, NP  lisinopril  (ZESTRIL ) 20 MG tablet TAKE 1 TABLET BY MOUTH EVERY DAY 07/05/24  Yes Burlon Centrella, Emil Schanz, MD  metFORMIN  (GLUCOPHAGE ) 1000 MG tablet TAKE 1 TABLET (1,000 MG TOTAL) BY MOUTH TWICE A DAY WITH FOOD 09/14/23  Yes Ashyia Schraeder Jose, MD  metoprolol  succinate (TOPROL -XL) 25 MG 24 hr tablet TAKE 1 TABLET BY MOUTH EVERY DAY 01/18/24  Yes Isair Inabinet Jose, MD  [facility] ULTRA TEST test strip USE AS INSTRUCTED 09/01/23  Yes Purcell Emil Schanz, MD  predniSONE  (DELTASONE ) 20 MG tablet 2 tabs po daily x 4 days 08/14/24  Yes Raford Lenis, MD  promethazine -dextromethorphan  (PROMETHAZINE -DM) 6.25-15 MG/5ML syrup Take 5 mLs by mouth 4 (four) times daily as needed for cough. 08/20/24  Yes Garrison, [place]  N, FNP  Testosterone  Enanthate (XYOSTED ) 75 MG/0.5ML SOAJ Inject 75 mg into the skin once a week. 04/14/24  Yes Purcell Emil Schanz, MD  TRULICITY  3 MG/0.5ML SOAJ INJECT 3MG  SUBCUTANEOUSLY ONCE A WEEK 07/05/24  Yes Tearra Ouk Jose, MD  amoxicillin -clavulanate (AUGMENTIN ) 875-125 MG tablet Take 1 tablet by mouth every 12 (twelve) hours. Patient not taking: Reported on 11/25/2024 08/20/24   Dreama Gennette SAILOR, FNP    Allergies[1]  Patient Active Problem List   Diagnosis Date Noted   Chronic depression 06/24/2023   Chronic right shoulder pain 04/27/2021   Erectile dysfunction 04/26/2021   Body mass index (BMI) of 40.1-44.9 in adult [facility])  07/17/2020   S/P arthroscopy of left shoulder 02/01/2020   Impingement syndrome of left shoulder 01/14/2020   Hypertension associated with diabetes (HCC) 09/27/2019   Morbid obesity (HCC) 09/27/2019   Dyslipidemia 09/09/2018   Environmental allergies 09/09/2018   BMI 39.0-39.9,adult 09/03/2017   Dyslipidemia associated with type 2 diabetes mellitus (HCC) 09/03/2017   Anemia 04/14/2017   Hyperlipidemia 05/02/2016   OSA (obstructive sleep apnea) 09/06/2015   Benign essential HTN 09/06/2015    Past Medical  History:  Diagnosis Date   Allergy    Diabetes mellitus without complication (HCC)    Elevated cholesterol    Hernia    bi lateral hernia repair   History of kidney stones    Hypertension    Sleep apnea 2014   severe   Umbilical hernia without obstruction and without gangrene 09/03/2017    Past Surgical History:  Procedure Laterality Date   CARPAL TUNNEL RELEASE Right 2007   CARPAL TUNNEL RELEASE Left 10/28/2014   Procedure: LEFT CARPAL TUNNEL RELEASE;  Surgeon: Donnice Robinsons, MD;  Location: [facility];  Service: Orthopedics;  Laterality: Left;   FOOT SURGERY Left    INGUINAL HERNIA REPAIR     times two, each side   KIDNEY STONE SURGERY     MASS EXCISION Left 10/28/2014   Procedure: LEFT WRIST VOLAR MASS EXCISION;  Surgeon: Donnice Robinsons, MD;  Location: [facility];  Service: Orthopedics;  Laterality: Left;   UMBILICAL HERNIA REPAIR N/A 10/27/2017   Procedure: HERNIA REPAIR UMBILICAL ADULT;  Surgeon: Signe Mitzie LABOR, MD;  Location: [facility] OR;  Service: General;  Laterality: N/A;    Social History   Socioeconomic History   Marital status: Married    Spouse name: Sharene JAYSON Hoyle   Number of children: 2   Years of education: Associates   Highest education level: Associate degree: occupational, scientist, product/process development, or vocational program  Occupational History   Occupation: Merchandiser, Retail    Comment: [employer]  Tobacco Use   Smoking status: Never   Smokeless tobacco: Never  Vaping Use   Vaping status: Never Used  Substance and Sexual Activity   Alcohol use: No    Alcohol/week: 0.0 standard drinks of alcohol   Drug use: No   Sexual activity: Yes    Partners: Female    Birth control/protection: Condom  Other Topics Concern   Not on file  Social History Narrative   Lives with his wife and their 2 children.   He has 3 sons from a previous relationship that live independently.   Formerly in the [employer].   Culinary degree, cuts hair, also has a  business community education officer; hopes to retire from [employer] after 20 years.   Social Drivers of Health   Tobacco Use: Low Risk (08/31/2024)   Received from [facility]   Patient History    Smoking Tobacco Use: Never    Smokeless Tobacco Use: Never    Passive Exposure: Never  Financial Resource Strain: Low Risk (08/31/2024)   Received from [facility]   Overall Financial Resource Strain (CARDIA)    How hard is it for you to pay for the very basics like food, housing, medical care, and heating?: Not hard at all  Food Insecurity: No Food Insecurity (08/31/2024)   Received from [facility]   Epic    Within the past 12 months, you worried that your food would run out before you got the money to buy more.: Never true    Within the past  12 months, the food you bought just didn't last and you didn't have money to get more.: Never true  Transportation Needs: No Transportation Needs (08/31/2024)   Received from [facility]    In the past 12 months, has lack of transportation kept you from medical appointments or from getting medications?: No    In the past 12 months, has lack of transportation kept you from meetings, work, or from getting things needed for daily living?: No  Physical Activity: Inactive (08/31/2024)   Received from [facility]   Exercise Vital Sign    On average, how many days per week do you engage in moderate to strenuous exercise (like a brisk walk)?: 0 days    Minutes of Exercise per Session: Not on file  Stress: No Stress Concern Present (08/31/2024)   Received from [facility] - Occupational Stress Questionnaire    Do you feel stress - tense, restless, nervous, or anxious, or unable to sleep at night because your mind is troubled all the time - these days?: Not at all  Social Connections: Socially Integrated (08/31/2024)   Received from [facility]   Social Network    How would you rate your social  network (family, work, friends)?: Good participation with social networks  Intimate Partner Violence: Not At Risk (08/31/2024)   Received from [facility]   HITS    Over the last 12 months how often did your partner physically hurt you?: Never    Over the last 12 months how often did your partner insult you or talk down to you?: Never    Over the last 12 months how often did your partner threaten you with physical harm?: Never    Over the last 12 months how often did your partner scream or curse at you?: Never  Depression (PHQ2-9): Low Risk (07/22/2024)   Depression (PHQ2-9)    PHQ-2 Score: 0  Alcohol Screen: Low Risk (06/22/2023)   Alcohol Screen    Last Alcohol Screening Score (AUDIT): 1  Housing: Unknown (08/31/2024)   Received from [facility]    In the last 12 months, was there a time when you were not able to pay the mortgage or rent on time?: No    Number of Times Moved in the Last Year: Not on file    At any time in the past 12 months, were you homeless or living in a shelter (including now)?: No  Utilities: Not At Risk (08/31/2024)   Received from [facility]    In the past 12 months has the electric, gas, oil, or water company threatened to shut off services in your home?: No  Health Literacy: Not on file    Family History  Problem Relation Age of Onset   Diabetes Mother    Stroke Mother        4 strokes   Heart attack Mother    Heart disease Mother    Hypertension Father    Hypertension Sister    Heart disease Sister        pacemaker   Colon cancer Neg Hx    Rectal cancer Neg Hx    Stomach cancer Neg Hx      Review of Systems  Constitutional: Negative.  Negative for chills and fever.  HENT: Negative.  Negative for congestion and sore throat.   Respiratory: Negative.  Negative for cough and shortness of breath.  Cardiovascular: Negative.  Negative for chest pain and palpitations.  Gastrointestinal:  Negative for abdominal pain, diarrhea, nausea  and vomiting.  Genitourinary: Negative.  Negative for dysuria and hematuria.  Skin: Negative.  Negative for rash.  Neurological: Negative.  Negative for dizziness and headaches.  All other systems reviewed and are negative.   Today's Vitals   11/25/24 0847  BP: 126/82  Pulse: 86  Temp: 98 F (36.7 C)  TempSrc: Oral  SpO2: 97%  Weight: 252 lb (114.3 kg)  Height: 5' 7 (1.702 m)   Body mass index is 39.47 kg/m.   Physical Exam Vitals reviewed.  Constitutional:      Appearance: Normal appearance.  HENT:     Head: Normocephalic.     Mouth/Throat:     Mouth: Mucous membranes are moist.     Pharynx: Oropharynx is clear.  Eyes:     Extraocular Movements: Extraocular movements intact.     Pupils: Pupils are equal, round, and reactive to light.  Cardiovascular:     Rate and Rhythm: Normal rate and regular rhythm.     Pulses: Normal pulses.     Heart sounds: Normal heart sounds.  Pulmonary:     Effort: Pulmonary effort is normal.     Breath sounds: Normal breath sounds.  Musculoskeletal:     Cervical back: No tenderness.  Lymphadenopathy:     Cervical: No cervical adenopathy.  Skin:    General: Skin is warm and dry.     Capillary Refill: Capillary refill takes less than 2 seconds.  Neurological:     General: No focal deficit present.     Mental Status: He is alert and oriented to person, place, and time.  Psychiatric:        Mood and Affect: Mood normal.        Behavior: Behavior normal.    Results for orders placed or performed in visit on 11/25/24 (from the past 24 hours)  POCT HgB A1C     Status: Abnormal   Collection Time: 11/25/24  9:05 AM  Result Value Ref Range   Hemoglobin A1C     HbA1c POC (<> result, manual entry) 6.5 4.0 - 5.6 %   HbA1c, POC (prediabetic range)     HbA1c, POC (controlled diabetic range)        ASSESSMENT & PLAN: A total of 42 minutes was spent with the patient and counseling/coordination of care regarding preparing for this visit,  review of most recent office visit notes, review of multiple chronic medical conditions and their management, cardiovascular risks associated with hypertension and diabetes, review of all medications, review of most recent bloodwork results including interpretation of today's hemoglobin A1c, review of health maintenance items, education on nutrition, prognosis, documentation, and need for follow up.   Problem List Items Addressed This Visit       Cardiovascular and Mediastinum   Hypertension associated with diabetes (HCC) - Primary   Well-controlled hypertension Continue lisinopril  20 mg daily and metoprolol  succinate 25 mg daily Well-controlled diabetes with hemoglobin A1c 6.5 Cardiovascular risks associated with hypertension and diabetes discussed Diet and nutrition discussed Continues Farxiga  10 mg daily, weekly Trulicity  3 mg, daily metformin  1000 mg twice a day Follow-up in 6 months      Relevant Orders   POCT HgB A1C (Completed)     Respiratory   OSA (obstructive sleep apnea)   Still struggling with CPAP Last pulmonary evaluation as follows:   Encouraged to keep equipment clean and dry as patient has  already been doing -  Resume CPAP use once improving nasal congestion; unable to tolerate presently with acute illness. -  Review compliance download at next appointment        Endocrine   Dyslipidemia associated with type 2 diabetes mellitus (HCC)   Chronic stable conditions Diet and nutrition discussed Continue atorvastatin  40 mg daily.             Other   Morbid obesity (HCC)   Diet and nutrition discussed Benefits of exercise discussed Advised to decrease amount of daily carbohydrate intake and daily calories and increase amount of plant-based protein in his diet      Patient Instructions  Health Maintenance, Male Adopting a healthy lifestyle and getting preventive care are important in promoting health and wellness. Ask your health care provider about: The  right schedule for you to have regular tests and exams. Things you can do on your own to prevent diseases and keep yourself healthy. What should I know about diet, weight, and exercise? Eat a healthy diet  Eat a diet that includes plenty of vegetables, fruits, low-fat dairy products, and lean protein. Do not eat a lot of foods that are high in solid fats, added sugars, or sodium. Maintain a healthy weight Body mass index (BMI) is a measurement that can be used to identify possible weight problems. It estimates body fat based on height and weight. Your health care provider can help determine your BMI and help you achieve or maintain a healthy weight. Get regular exercise Get regular exercise. This is one of the most important things you can do for your health. Most adults should: Exercise for at least 150 minutes each week. The exercise should increase your heart rate and make you sweat (moderate-intensity exercise). Do strengthening exercises at least twice a week. This is in addition to the moderate-intensity exercise. Spend less time sitting. Even light physical activity can be beneficial. Watch cholesterol and blood lipids Have your blood tested for lipids and cholesterol at 54 years of age, then have this test every 5 years. You may need to have your cholesterol levels checked more often if: Your lipid or cholesterol levels are high. You are older than 54 years of age. You are at high risk for heart disease. What should I know about cancer screening? Many types of cancers can be detected early and may often be prevented. Depending on your health history and family history, you may need to have cancer screening at various ages. This may include screening for: Colorectal cancer. Prostate cancer. Skin cancer. Lung cancer. What should I know about heart disease, diabetes, and high blood pressure? Blood pressure and heart disease High blood pressure causes heart disease and increases the  risk of stroke. This is more likely to develop in people who have high blood pressure readings or are overweight. Talk with your health care provider about your target blood pressure readings. Have your blood pressure checked: Every 3-5 years if you are 55-32 years of age. Every year if you are 58 years old or older. If you are between the ages of 39 and 90 and are a current or former smoker, ask your health care provider if you should have a one-time screening for abdominal aortic aneurysm (AAA). Diabetes Have regular diabetes screenings. This checks your fasting blood sugar level. Have the screening done: Once every three years after age 50 if you are at a normal weight and have a low risk for diabetes. More often and at a  younger age if you are overweight or have a high risk for diabetes. What should I know about preventing infection? Hepatitis B If you have a higher risk for hepatitis B, you should be screened for this virus. Talk with your health care provider to find out if you are at risk for hepatitis B infection. Hepatitis C Blood testing is recommended for: Everyone born from [birth year] through 1966. Anyone with known risk factors for hepatitis C. Sexually transmitted infections (STIs) You should be screened each year for STIs, including gonorrhea and chlamydia, if: You are sexually active and are younger than 54 years of age. You are older than 54 years of age and your health care provider tells you that you are at risk for this type of infection. Your sexual activity has changed since you were last screened, and you are at increased risk for chlamydia or gonorrhea. Ask your health care provider if you are at risk. Ask your health care provider about whether you are at high risk for HIV. Your health care provider may recommend a prescription medicine to help prevent HIV infection. If you choose to take medicine to prevent HIV, you should first get tested for HIV. You should then be tested  every 3 months for as long as you are taking the medicine. Follow these instructions at home: Alcohol use Do not drink alcohol if your health care provider tells you not to drink. If you drink alcohol: Limit how much you have to 0-2 drinks a day. Know how much alcohol is in your drink. In the [place] one drink equals one 12 oz bottle of beer (355 mL), one 5 oz glass of wine (148 mL), or one 1 oz glass of hard liquor (44 mL). Lifestyle Do not use any products that contain nicotine or tobacco. These products include cigarettes, chewing tobacco, and vaping devices, such as e-cigarettes. If you need help quitting, ask your health care provider. Do not use street drugs. Do not share needles. Ask your health care provider for help if you need support or information about quitting drugs. General instructions Schedule regular health, dental, and eye exams. Stay current with your vaccines. Tell your health care provider if: You often feel depressed. You have ever been abused or do not feel safe at home. Summary Adopting a healthy lifestyle and getting preventive care are important in promoting health and wellness. Follow your health care provider's instructions about healthy diet, exercising, and getting tested or screened for diseases. Follow your health care provider's instructions on monitoring your cholesterol and blood pressure. This information is not intended to replace advice given to you by your health care provider. Make sure you discuss any questions you have with your health care provider. Document Revised: 04/23/2021 Document Reviewed: 04/23/2021 Elsevier Patient Education  2024 Elsevier Inc.    Emil Schaumann, MD [facility]    [1]  Allergies Allergen Reactions   Doxycycline  Hives   Erythromycin Base Hives   Glipizide  Shortness Of Breath    Shakey    Armodafinil  Palpitations    In high doses per patient.

## 2024-11-25 NOTE — Assessment & Plan Note (Signed)
 Chronic stable conditions Diet and nutrition discussed Continue atorvastatin 40 mg daily.

## 2024-11-25 NOTE — Assessment & Plan Note (Signed)
 Well-controlled hypertension Continue lisinopril  20 mg daily and metoprolol  succinate 25 mg daily Well-controlled diabetes with hemoglobin A1c 6.5 Cardiovascular risks associated with hypertension and diabetes discussed Diet and nutrition discussed Continues Farxiga  10 mg daily, weekly Trulicity  3 mg, daily metformin  1000 mg twice a day Follow-up in 6 months

## 2024-11-25 NOTE — Assessment & Plan Note (Signed)
Diet and nutrition discussed. Benefits of exercise discussed. Advised to decrease amount of daily carbohydrate intake and daily calories and increase amount of plant-based protein in his diet 

## 2024-12-06 ENCOUNTER — Encounter (HOSPITAL_BASED_OUTPATIENT_CLINIC_OR_DEPARTMENT_OTHER): Payer: Self-pay

## 2024-12-06 ENCOUNTER — Emergency Department (HOSPITAL_BASED_OUTPATIENT_CLINIC_OR_DEPARTMENT_OTHER)
Admission: EM | Admit: 2024-12-06 | Discharge: 2024-12-06 | Discharge status: Against Medical Advice | Attending: Emergency Medicine | Admitting: Emergency Medicine

## 2024-12-06 ENCOUNTER — Other Ambulatory Visit: Payer: Self-pay

## 2024-12-06 DIAGNOSIS — U071 COVID-19: Secondary | ICD-10-CM | POA: Insufficient documentation

## 2024-12-06 DIAGNOSIS — Z5321 Procedure and treatment not carried out due to patient leaving prior to being seen by health care provider: Secondary | ICD-10-CM | POA: Insufficient documentation

## 2024-12-06 DIAGNOSIS — J069 Acute upper respiratory infection, unspecified: Secondary | ICD-10-CM | POA: Insufficient documentation

## 2024-12-06 DIAGNOSIS — R0981 Nasal congestion: Secondary | ICD-10-CM | POA: Diagnosis present

## 2024-12-06 LAB — RESP PANEL BY RT-PCR (RSV, FLU A&B, COVID)  RVPGX2
Influenza A by PCR: NEGATIVE
Influenza B by PCR: NEGATIVE
Resp Syncytial Virus by PCR: NEGATIVE
SARS Coronavirus 2 by RT PCR: POSITIVE — AB

## 2024-12-06 NOTE — ED Triage Notes (Signed)
 Pt c/o congestion, pressure, bodyaches x1wk. Recent contact w grandkids, advises they're all sick.

## 2024-12-07 ENCOUNTER — Encounter (HOSPITAL_COMMUNITY): Payer: Self-pay

## 2024-12-07 ENCOUNTER — Ambulatory Visit (HOSPITAL_COMMUNITY)

## 2024-12-07 ENCOUNTER — Ambulatory Visit (HOSPITAL_COMMUNITY)
Admission: RE | Admit: 2024-12-07 | Discharge: 2024-12-07 | Disposition: A | Discharge status: Home / Self Care | Source: Ambulatory Visit | Attending: Emergency Medicine | Admitting: Emergency Medicine

## 2024-12-07 VITALS — BP 111/77 | HR 100 | Temp 99.5°F | Resp 18

## 2024-12-07 DIAGNOSIS — U071 COVID-19: Secondary | ICD-10-CM

## 2024-12-07 DIAGNOSIS — R051 Acute cough: Secondary | ICD-10-CM

## 2024-12-07 DIAGNOSIS — J0140 Acute pansinusitis, unspecified: Secondary | ICD-10-CM | POA: Diagnosis not present

## 2024-12-07 MED ORDER — AMOXICILLIN-POT CLAVULANATE 875-125 MG PO TABS: 1.0000 | ORAL_TABLET | Freq: Two times a day (BID) | ORAL | 0 refills | Status: AC

## 2024-12-07 MED ORDER — PREDNISONE 20 MG PO TABS: 40.0000 mg | ORAL_TABLET | Freq: Every day | ORAL | 0 refills | Status: AC

## 2024-12-07 NOTE — Discharge Instructions (Addendum)
 Your chest x-ray is negative for pneumonia.  As discussed I believe your symptoms are likely related to ongoing COVID. I have prescribed Augmentin  to cover for possible sinusitis related to this.  Start taking this twice daily for 7 days. Start taking 2 tablets of prednisone  once daily for 5 days to help reduce inflammation related to sinus pressure and symptoms. Continue taking Tylenol  every 6-8 hours as needed for any pain or fever. Make sure you are staying hydrated getting plenty of rest. Follow-up with your primary care provider or return here as needed.

## 2024-12-07 NOTE — ED Triage Notes (Signed)
 Pt states he went to ED and tested positive for COVID yesterday but left without being seen and saw his test results on mychart. He is not taking nay meds for sx  Pt states that he is having an issue with his CPAP and last time it was getting clogged up he needed an antibiotic.

## 2024-12-07 NOTE — ED Provider Notes (Signed)
 " [facility]    CSN: [id number] Arrival date & time: 12/07/24  1707      History   Chief Complaint Chief Complaint  Patient presents with   Covid Positive    HPI Walter Reynolds. is a 53 y.o. male.   Patient presents with cough, nasal congestion, sinus pressure, and intermittent fever that began approximately 5 to 6 days ago.  Patient reports that he is not having chest pain with cough and some mild shortness of breath.  Patient reports that he did have a low-grade fever last night as well.   Patient reports that he went to the ER yesterday and they tested him for COVID which he saw was positive on his MyChart.  Patient states that he did leave prior to being seen due to wait time.  Patient does have history of sleep apnea and wears a CPAP at night, also has a history of hyperlipidemia, hypertension, and diabetes.  Patient reports that he has had some difficulty sleeping because he is unable to breathe through his nose properly and therefore it is uncomfortable for him to wear his CPAP.  Patient states the last time this happened he was prescribed antibiotics with much relief of this.  The history is provided by the patient and medical records.    Past Medical History:  Diagnosis Date   Allergy    Diabetes mellitus without complication (HCC)    Elevated cholesterol    Hernia    bi lateral hernia repair   History of kidney stones    Hypertension    Sleep apnea 2014   severe   Umbilical hernia without obstruction and without gangrene 09/03/2017    Patient Active Problem List   Diagnosis Date Noted   Chronic depression 06/24/2023   Chronic right shoulder pain 04/27/2021   Erectile dysfunction 04/26/2021   Body mass index (BMI) of 40.1-44.9 in adult [facility]) 07/17/2020   S/P arthroscopy of left shoulder 02/01/2020   Impingement syndrome of left shoulder 01/14/2020   Hypertension associated with diabetes (HCC) 09/27/2019   Morbid obesity (HCC) 09/27/2019    Environmental allergies 09/09/2018   BMI 39.0-39.9,adult 09/03/2017   Dyslipidemia associated with type 2 diabetes mellitus (HCC) 09/03/2017   Anemia 04/14/2017   OSA (obstructive sleep apnea) 09/06/2015   Benign essential HTN 09/06/2015    Past Surgical History:  Procedure Laterality Date   CARPAL TUNNEL RELEASE Right 2007   CARPAL TUNNEL RELEASE Left 10/28/2014   Procedure: LEFT CARPAL TUNNEL RELEASE;  Surgeon: Donnice Robinsons, MD;  Location: [facility];  Service: Orthopedics;  Laterality: Left;   FOOT SURGERY Left    INGUINAL HERNIA REPAIR     times two, each side   KIDNEY STONE SURGERY     MASS EXCISION Left 10/28/2014   Procedure: LEFT WRIST VOLAR MASS EXCISION;  Surgeon: Donnice Robinsons, MD;  Location: [facility];  Service: Orthopedics;  Laterality: Left;   UMBILICAL HERNIA REPAIR N/A 10/27/2017   Procedure: HERNIA REPAIR UMBILICAL ADULT;  Surgeon: Signe Mitzie LABOR, MD;  Location: [facility];  Service: General;  Laterality: N/A;       Home Medications    Prior to Admission medications  Medication Sig Start Date End Date Taking? Authorizing Provider  albuterol  (VENTOLIN  HFA) 108 (90 Base) MCG/ACT inhaler Inhale 2 puffs into the lungs every 4 (four) hours as needed for wheezing or shortness of breath. 08/14/24  Yes Raford Lenis, MD  amoxicillin -clavulanate (AUGMENTIN ) 875-125 MG tablet Take 1 tablet  by mouth every 12 (twelve) hours. 12/07/24  Yes Johnie Flaming A, NP  aspirin  EC 81 MG EC tablet Take 1 tablet (81 mg total) by mouth daily. 04/02/20  Yes Jerri Keys, MD  atorvastatin  (LIPITOR) 40 MG tablet TAKE 1 TABLET BY MOUTH EVERY DAY 10/19/23  Yes Sagardia, Emil Schanz, MD  benzonatate  (TESSALON ) 200 MG capsule Take 1 capsule (200 mg total) by mouth 3 (three) times daily as needed for cough. 08/24/24  Yes Charley Conger, PA-C  dapagliflozin  propanediol (FARXIGA ) 10 MG TABS tablet Take 1 tablet (10 mg total) by mouth daily before breakfast. 05/26/24  Yes  Sagardia, [name], MD  dextromethorphan -guaiFENesin  (MUCINEX  DM) 30-600 MG 12hr tablet Take 1 tablet by mouth 2 (two) times daily. 08/24/24  Yes Charley Conger, PA-C  FARXIGA  10 MG TABS tablet TAKE 1 TABLET BY MOUTH DAILY BEFORE BREAKFAST. 10/24/23  Yes Sagardia, Emil Schanz, MD  fluticasone  (FLONASE ) 50 MCG/ACT nasal spray SPRAY 2 SPRAYS INTO EACH NOSTRIL EVERY DAY Patient taking differently: Place 2 sprays into both nostrils as needed. SPRAY 2 SPRAYS INTO EACH NOSTRIL EVERY DAY 01/11/21  Yes Just, Kelsea J, FNP  Lancets (ONETOUCH ULTRASOFT) lancets Use as instructed 06/13/22  Yes Elnor Lauraine BRAVO, NP  lisinopril  (ZESTRIL ) 20 MG tablet TAKE 1 TABLET BY MOUTH EVERY DAY 11/25/24  Yes Sagardia, Emil Schanz, MD  metFORMIN  (GLUCOPHAGE ) 1000 MG tablet TAKE 1 TABLET (1,000 MG TOTAL) BY MOUTH TWICE A DAY WITH FOOD 09/14/23  Yes Sagardia, Emil Schanz, MD  metoprolol  succinate (TOPROL -XL) 25 MG 24 hr tablet TAKE 1 TABLET BY MOUTH EVERY DAY 01/18/24  Yes Sagardia, [name], MD  [facility] ULTRA TEST test strip USE AS INSTRUCTED 09/01/23  Yes Sagardia, Emil Schanz, MD  predniSONE  (DELTASONE ) 20 MG tablet Take 2 tablets (40 mg total) by mouth daily for 5 days. 12/07/24 12/12/24 Yes Johnie Flaming A, NP  promethazine -dextromethorphan  (PROMETHAZINE -DM) 6.25-15 MG/5ML syrup Take 5 mLs by mouth 4 (four) times daily as needed for cough. 08/20/24  Yes Garrison, [place]  N, FNP  tadalafil  (CIALIS ) 5 MG tablet Take 5 mg by mouth daily.   Yes [provider]  Testosterone  Enanthate (XYOSTED ) 75 MG/0.5ML SOAJ Inject 75 mg into the skin once a week. 04/14/24  Yes Sagardia, Emil Schanz, MD  TRULICITY  3 MG/0.5ML SOAJ INJECT 3MG  SUBCUTANEOUSLY ONCE A WEEK 07/05/24  Yes Purcell Emil Schanz, MD    Family History Family History  Problem Relation Age of Onset   Diabetes Mother    Stroke Mother        4 strokes   Heart attack Mother    Heart disease Mother    Hypertension Father    Hypertension Sister    Heart disease  Sister        pacemaker   Colon cancer Neg Hx    Rectal cancer Neg Hx    Stomach cancer Neg Hx     Social History Social History[1]   Allergies   Doxycycline , Erythromycin base, Glipizide , and Armodafinil    Review of Systems Review of Systems  Per HPI  Physical Exam Triage Vital Signs ED Triage Vitals  Encounter Vitals Group     BP 12/07/24 1753 111/77     Girls Systolic BP Percentile --      Girls Diastolic BP Percentile --      Boys Systolic BP Percentile --      Boys Diastolic BP Percentile --      Pulse Rate 12/07/24 1753 100     Resp 12/07/24 1753 18  Temp 12/07/24 1753 99.5 F (37.5 C)     Temp Source 12/07/24 1753 Oral     SpO2 12/07/24 1753 94 %     Weight --      Height --      Head Circumference --      Peak Flow --      Pain Score 12/07/24 1752 0     Pain Loc --      Pain Education --      Exclude from Growth Chart --    No data found.  Updated Vital Signs BP 111/77 (BP Location: Right Arm)   Pulse 100   Temp 99.5 F (37.5 C) (Oral)   Resp 18   SpO2 94%   Visual Acuity Right Eye Distance:   Left Eye Distance:   Bilateral Distance:    Right Eye Near:   Left Eye Near:    Bilateral Near:     Physical Exam Vitals and nursing note reviewed.  Constitutional:      General: He is awake. He is not in acute distress.    Appearance: Normal appearance. He is well-groomed. He is not ill-appearing.  HENT:     Right Ear: Tympanic membrane, ear canal and external ear normal.     Left Ear: Tympanic membrane, ear canal and external ear normal.     Nose: Congestion and rhinorrhea present.     Right Sinus: Maxillary sinus tenderness and frontal sinus tenderness present.     Left Sinus: Maxillary sinus tenderness and frontal sinus tenderness present.     Mouth/Throat:     Mouth: Mucous membranes are moist.     Pharynx: Posterior oropharyngeal erythema present. No oropharyngeal exudate.  Cardiovascular:     Rate and Rhythm: Normal rate and regular  rhythm.  Pulmonary:     Effort: Pulmonary effort is normal.     Breath sounds: Normal breath sounds.  Skin:    General: Skin is warm and dry.  Neurological:     General: No focal deficit present.     Mental Status: He is alert and oriented to person, place, and time. Mental status is at baseline.  Psychiatric:        Behavior: Behavior is cooperative.      UC Treatments / Results  Labs (all labs ordered are listed, but only abnormal results are displayed) Labs Reviewed - No data to display  EKG   Radiology No results found.  Procedures Procedures (including critical care time)  Medications Ordered in UC Medications - No data to display  Initial Impression / Assessment and Plan / UC Course  I have reviewed the triage vital signs and the nursing notes.  Pertinent labs & imaging results that were available during my care of the patient were reviewed by me and considered in my medical decision making (see chart for details).     Patient is overall well-appearing.  Vitals are stable.  Chest x-ray ordered to rule out underlying pneumonia due to chest pain with cough.  I dependently interpreted these images and there appears to be no active cardiopulmonary disease at this time.  Prescribed Augmentin  for sinusitis coverage.  Prescribed short course of prednisone  for additional relief of symptoms related to this.  Recommended Tylenol  as needed for pain and fever.  Discussed follow-up and return precautions. Final Clinical Impressions(s) / UC Diagnoses   Final diagnoses:  Acute cough  COVID-19  Acute non-recurrent pansinusitis     Discharge Instructions      Your  chest x-ray is negative for pneumonia.  As discussed I believe your symptoms are likely related to ongoing COVID. I have prescribed Augmentin  to cover for possible sinusitis related to this.  Start taking this twice daily for 7 days. Start taking 2 tablets of prednisone  once daily for 5 days to help reduce  inflammation related to sinus pressure and symptoms. Continue taking Tylenol  every 6-8 hours as needed for any pain or fever. Make sure you are staying hydrated getting plenty of rest. Follow-up with your primary care provider or return here as needed.     ED Prescriptions     Medication Sig Dispense Auth. Provider   amoxicillin -clavulanate (AUGMENTIN ) 875-125 MG tablet Take 1 tablet by mouth every 12 (twelve) hours. 14 tablet Johnie, Samaiyah Howes A, NP   predniSONE  (DELTASONE ) 20 MG tablet Take 2 tablets (40 mg total) by mouth daily for 5 days. 10 tablet Johnie Flaming A, NP      PDMP not reviewed this encounter.    [1]  Social History Tobacco Use   Smoking status: Never   Smokeless tobacco: Never  Vaping Use   Vaping status: Never Used  Substance Use Topics   Alcohol use: No    Alcohol/week: 0.0 standard drinks of alcohol   Drug use: No     Johnie Flaming LABOR, NP 12/07/24 1922  "

## 2024-12-27 ENCOUNTER — Other Ambulatory Visit (HOSPITAL_COMMUNITY): Payer: Self-pay

## 2025-01-01 ENCOUNTER — Ambulatory Visit (HOSPITAL_COMMUNITY)

## 2025-01-05 ENCOUNTER — Other Ambulatory Visit: Payer: Self-pay | Admitting: Emergency Medicine

## 2025-01-05 DIAGNOSIS — I152 Hypertension secondary to endocrine disorders: Secondary | ICD-10-CM

## 2025-01-06 ENCOUNTER — Other Ambulatory Visit: Payer: Self-pay

## 2025-01-06 ENCOUNTER — Telehealth: Payer: Self-pay

## 2025-01-06 DIAGNOSIS — I152 Hypertension secondary to endocrine disorders: Secondary | ICD-10-CM

## 2025-01-06 MED ORDER — TRULICITY 3 MG/0.5ML ~~LOC~~ SOAJ: 3.0000 mg | SUBCUTANEOUS | 0 refills | Status: AC

## 2025-01-06 MED ORDER — ATORVASTATIN CALCIUM 40 MG PO TABS: 40.0000 mg | ORAL_TABLET | Freq: Every day | ORAL | 3 refills | Status: AC

## 2025-01-06 NOTE — Telephone Encounter (Signed)
 Copied from CRM #[id number]. Topic: Clinical - Medication Refill >> Jan 06, 2025 12:04 PM Antony RAMAN wrote: Medication: atorvastatin  (LIPITOR) 40 MG tablet  Has the patient contacted their pharmacy? Yes (Agent: If no, request that the patient contact the pharmacy for the refill. If patient does not wish to contact the pharmacy document the reason why and proceed with request.) (Agent: If yes, when and what did the pharmacy advise?)  This is the patient's preferred pharmacy:  [id number] - [place], [place] - [address] [facility] [address]  [place] [address] Phone: [phone] Fax: [phone]  Is this the correct pharmacy for this prescription? Yes If no, delete pharmacy and type the correct one.   Has the prescription been filled recently? No  Is the patient out of the medication? Yes  Has the patient been seen for an appointment in the last year OR does the patient have an upcoming appointment? Yes  Can we respond through MyChart? Yes  Agent: Please be advised that Rx refills may take up to 3 business days. We ask that you follow-up with your pharmacy.

## 2025-01-06 NOTE — Telephone Encounter (Signed)
 Copied from CRM #[id number]. Topic: Referral - Status >> Jan 06, 2025 12:06 PM Antony RAMAN wrote: Reason for CRM: checking on the ENT referral said he's been waiting since december

## 2025-01-06 NOTE — Telephone Encounter (Signed)
 I have sent this in as of today

## 2025-01-06 NOTE — Telephone Encounter (Signed)
 Copied from CRM #[id number]. Topic: Clinical - Prescription Issue >> Jan 06, 2025  2:24 PM Walter Reynolds wrote: Reason for CRM: Patient has been having trouble receiving his Trulicity . Agent located that the receipt was confirmed by the pharmacy on 1/21, patient then called [facility] pharmacy while on the phone with agent and relayed that they do not have this medication for him.

## 2025-01-06 NOTE — Telephone Encounter (Signed)
 I have resent this in.

## 2025-01-11 ENCOUNTER — Telehealth: Payer: Self-pay | Admitting: Pharmacy Technician

## 2025-01-11 ENCOUNTER — Telehealth: Payer: Self-pay

## 2025-01-11 ENCOUNTER — Other Ambulatory Visit (HOSPITAL_COMMUNITY): Payer: Self-pay

## 2025-01-11 NOTE — Telephone Encounter (Signed)
 Pharmacy Patient Advocate Encounter   Received notification from [facility] that prior authorization for Trulicity  3MG /0.5ML auto-injectors is required/requested.   Insurance verification completed.   The patient is insured through [employer].   Per test claim: Refill too soon. PA is not needed at this time. Medication was filled 01/08/2025. Next eligible fill date is 02/21/2025.

## 2025-01-11 NOTE — Telephone Encounter (Signed)
 Pharmacy Patient Advocate Encounter   Received notification from [facility] that prior authorization for Trulicity  is required/requested.   Insurance verification completed.   The patient is insured through [employer].   Per test claim: The current 28 day co-pay is, $415.95.  No PA needed at this time. This test claim was processed through [facility]- copay amounts may vary at other pharmacies due to pharmacy/plan contracts, or as the patient moves through the different stages of their insurance plan.     Pt. Has deductible to meet, will meet it after this fill

## 2025-01-11 NOTE — Telephone Encounter (Signed)
"  Please send to PA team.  "
